# Patient Record
Sex: Female | Born: 1945 | Race: White | Hispanic: No | Marital: Married | Smoking: Never smoker
Health system: Southern US, Community
[De-identification: ages and names within clinical notes are randomized; demographics above are authoritative.]

## PROBLEM LIST (undated history)

## (undated) DIAGNOSIS — E039 Hypothyroidism, unspecified: Secondary | ICD-10-CM

## (undated) DIAGNOSIS — I5022 Chronic systolic (congestive) heart failure: Secondary | ICD-10-CM

## (undated) DIAGNOSIS — E7800 Pure hypercholesterolemia, unspecified: Secondary | ICD-10-CM

## (undated) DIAGNOSIS — J189 Pneumonia, unspecified organism: Secondary | ICD-10-CM

## (undated) DIAGNOSIS — R5600 Simple febrile convulsions: Secondary | ICD-10-CM

## (undated) DIAGNOSIS — I420 Dilated cardiomyopathy: Secondary | ICD-10-CM

## (undated) DIAGNOSIS — Z9289 Personal history of other medical treatment: Secondary | ICD-10-CM

## (undated) DIAGNOSIS — M479 Spondylosis, unspecified: Secondary | ICD-10-CM

## (undated) DIAGNOSIS — I255 Ischemic cardiomyopathy: Secondary | ICD-10-CM

## (undated) DIAGNOSIS — I480 Paroxysmal atrial fibrillation: Secondary | ICD-10-CM

## (undated) DIAGNOSIS — I2510 Atherosclerotic heart disease of native coronary artery without angina pectoris: Secondary | ICD-10-CM

## (undated) DIAGNOSIS — M47815 Spondylosis without myelopathy or radiculopathy, thoracolumbar region: Secondary | ICD-10-CM

## (undated) DIAGNOSIS — M419 Scoliosis, unspecified: Secondary | ICD-10-CM

## (undated) DIAGNOSIS — M311 Thrombotic microangiopathy: Secondary | ICD-10-CM

## (undated) DIAGNOSIS — I5032 Chronic diastolic (congestive) heart failure: Secondary | ICD-10-CM

## (undated) DIAGNOSIS — E041 Nontoxic single thyroid nodule: Secondary | ICD-10-CM

## (undated) DIAGNOSIS — I10 Essential (primary) hypertension: Secondary | ICD-10-CM

## (undated) HISTORY — DX: Dilated cardiomyopathy: I42.0

## (undated) HISTORY — DX: Chronic systolic (congestive) heart failure: I50.22

## (undated) HISTORY — PX: APPENDECTOMY: SHX54

## (undated) HISTORY — PX: SALIVARY STONE REMOVAL: SHX5213

## (undated) HISTORY — PX: BASAL CELL CARCINOMA EXCISION: SHX1214

## (undated) HISTORY — PX: CATARACT EXTRACTION W/ INTRAOCULAR LENS  IMPLANT, BILATERAL: SHX1307

## (undated) HISTORY — PX: CHOLECYSTECTOMY OPEN: SUR202

## (undated) HISTORY — PX: CARDIOVERSION: SHX1299

## (undated) HISTORY — PX: BACK SURGERY: SHX140

## (undated) HISTORY — PX: TUBAL LIGATION: SHX77

## (undated) HISTORY — DX: Morbid (severe) obesity due to excess calories: E66.01

## (undated) HISTORY — DX: Thrombotic microangiopathy: M31.1

## (undated) HISTORY — PX: FOOT SURGERY: SHX648

## (undated) HISTORY — PX: TONSILLECTOMY: SUR1361

## (undated) HISTORY — PX: SPLENECTOMY, TOTAL: SHX788

## (undated) HISTORY — PX: VAGINAL HYSTERECTOMY: SUR661

## (undated) HISTORY — DX: Ischemic cardiomyopathy: I25.5

## (undated) HISTORY — DX: Atherosclerotic heart disease of native coronary artery without angina pectoris: I25.10

## (undated) HISTORY — PX: ABDOMINAL EXPLORATION SURGERY: SHX538

## (undated) HISTORY — PX: THYROIDECTOMY, PARTIAL: SHX18

## (undated) HISTORY — PX: TUMOR EXCISION: SHX421

## (undated) HISTORY — PX: CORONARY ANGIOPLASTY: SHX604

## (undated) HISTORY — PX: DILATION AND CURETTAGE OF UTERUS: SHX78

---
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
## 1997-10-03 ENCOUNTER — Ambulatory Visit (HOSPITAL_COMMUNITY): Admission: RE | Admit: 1997-10-03 | Discharge: 1997-10-03 | Payer: Self-pay | Admitting: *Deleted

## 1998-02-24 ENCOUNTER — Encounter: Payer: Self-pay | Admitting: Emergency Medicine

## 1998-02-24 ENCOUNTER — Emergency Department (HOSPITAL_COMMUNITY): Admission: EM | Admit: 1998-02-24 | Discharge: 1998-02-24 | Payer: Self-pay | Admitting: Emergency Medicine

## 1998-10-16 ENCOUNTER — Other Ambulatory Visit: Admission: RE | Admit: 1998-10-16 | Discharge: 1998-10-16 | Payer: Self-pay | Admitting: Internal Medicine

## 1999-09-16 ENCOUNTER — Encounter: Payer: Self-pay | Admitting: Internal Medicine

## 1999-09-16 ENCOUNTER — Encounter: Admission: RE | Admit: 1999-09-16 | Discharge: 1999-09-16 | Payer: Self-pay | Admitting: Internal Medicine

## 2002-01-31 DIAGNOSIS — R5600 Simple febrile convulsions: Secondary | ICD-10-CM

## 2002-01-31 DIAGNOSIS — Z9289 Personal history of other medical treatment: Secondary | ICD-10-CM

## 2002-01-31 HISTORY — DX: Simple febrile convulsions: R56.00

## 2002-01-31 HISTORY — DX: Personal history of other medical treatment: Z92.89

## 2002-05-02 HISTORY — PX: THORACIC DISCECTOMY: SHX100

## 2002-05-08 ENCOUNTER — Inpatient Hospital Stay (HOSPITAL_COMMUNITY): Admission: EM | Admit: 2002-05-08 | Discharge: 2002-06-25 | Payer: Self-pay | Admitting: Oncology

## 2002-05-08 ENCOUNTER — Encounter: Payer: Self-pay | Admitting: Oncology

## 2002-05-10 ENCOUNTER — Encounter: Payer: Self-pay | Admitting: Oncology

## 2002-05-11 ENCOUNTER — Encounter: Payer: Self-pay | Admitting: Oncology

## 2002-05-12 ENCOUNTER — Encounter: Payer: Self-pay | Admitting: Critical Care Medicine

## 2002-05-12 ENCOUNTER — Encounter: Payer: Self-pay | Admitting: Pulmonary Disease

## 2002-05-13 ENCOUNTER — Encounter: Payer: Self-pay | Admitting: Critical Care Medicine

## 2002-05-14 ENCOUNTER — Encounter: Payer: Self-pay | Admitting: Oncology

## 2002-05-14 ENCOUNTER — Encounter: Payer: Self-pay | Admitting: Pulmonary Disease

## 2002-05-14 ENCOUNTER — Encounter: Payer: Self-pay | Admitting: Nephrology

## 2002-05-14 ENCOUNTER — Encounter (INDEPENDENT_AMBULATORY_CARE_PROVIDER_SITE_OTHER): Payer: Self-pay | Admitting: Cardiology

## 2002-05-15 ENCOUNTER — Encounter: Payer: Self-pay | Admitting: Pulmonary Disease

## 2002-05-16 ENCOUNTER — Encounter: Payer: Self-pay | Admitting: Pulmonary Disease

## 2002-05-18 ENCOUNTER — Encounter: Payer: Self-pay | Admitting: Pulmonary Disease

## 2002-05-22 ENCOUNTER — Encounter: Payer: Self-pay | Admitting: Oncology

## 2002-05-24 ENCOUNTER — Encounter: Payer: Self-pay | Admitting: Pulmonary Disease

## 2002-05-27 ENCOUNTER — Encounter: Payer: Self-pay | Admitting: Oncology

## 2002-05-27 ENCOUNTER — Encounter (INDEPENDENT_AMBULATORY_CARE_PROVIDER_SITE_OTHER): Payer: Self-pay | Admitting: *Deleted

## 2002-05-27 ENCOUNTER — Encounter: Payer: Self-pay | Admitting: Pulmonary Disease

## 2002-05-29 ENCOUNTER — Encounter: Payer: Self-pay | Admitting: Pulmonary Disease

## 2002-05-29 ENCOUNTER — Encounter: Payer: Self-pay | Admitting: Critical Care Medicine

## 2002-05-31 ENCOUNTER — Encounter: Payer: Self-pay | Admitting: Oncology

## 2002-06-01 ENCOUNTER — Encounter: Payer: Self-pay | Admitting: Pulmonary Disease

## 2002-06-02 ENCOUNTER — Encounter: Payer: Self-pay | Admitting: Oncology

## 2002-06-02 ENCOUNTER — Encounter: Payer: Self-pay | Admitting: Pulmonary Disease

## 2002-06-03 ENCOUNTER — Encounter: Payer: Self-pay | Admitting: Oncology

## 2002-06-04 ENCOUNTER — Encounter: Payer: Self-pay | Admitting: Oncology

## 2002-06-05 ENCOUNTER — Encounter: Payer: Self-pay | Admitting: Oncology

## 2002-06-06 ENCOUNTER — Encounter: Payer: Self-pay | Admitting: Oncology

## 2002-06-07 ENCOUNTER — Encounter: Payer: Self-pay | Admitting: Oncology

## 2002-06-12 ENCOUNTER — Encounter: Payer: Self-pay | Admitting: Oncology

## 2002-06-14 ENCOUNTER — Encounter: Payer: Self-pay | Admitting: Cardiology

## 2002-06-14 ENCOUNTER — Encounter: Payer: Self-pay | Admitting: Oncology

## 2002-07-17 ENCOUNTER — Encounter (HOSPITAL_COMMUNITY): Admission: RE | Admit: 2002-07-17 | Discharge: 2002-09-17 | Payer: Self-pay | Admitting: Oncology

## 2003-02-25 ENCOUNTER — Emergency Department (HOSPITAL_COMMUNITY): Admission: EM | Admit: 2003-02-25 | Discharge: 2003-02-25 | Payer: Self-pay | Admitting: *Deleted

## 2003-06-20 ENCOUNTER — Ambulatory Visit (HOSPITAL_COMMUNITY): Admission: RE | Admit: 2003-06-20 | Discharge: 2003-06-20 | Payer: Self-pay | Admitting: Oncology

## 2003-06-24 ENCOUNTER — Inpatient Hospital Stay (HOSPITAL_COMMUNITY): Admission: EM | Admit: 2003-06-24 | Discharge: 2003-06-26 | Payer: Self-pay | Admitting: Emergency Medicine

## 2003-07-31 ENCOUNTER — Encounter: Admission: RE | Admit: 2003-07-31 | Discharge: 2003-08-28 | Payer: Self-pay | Admitting: Oncology

## 2003-10-21 ENCOUNTER — Encounter: Admission: RE | Admit: 2003-10-21 | Discharge: 2003-10-21 | Payer: Self-pay | Admitting: Family Medicine

## 2003-12-24 ENCOUNTER — Ambulatory Visit: Payer: Self-pay | Admitting: Oncology

## 2004-03-23 ENCOUNTER — Ambulatory Visit: Payer: Self-pay | Admitting: Oncology

## 2004-06-01 ENCOUNTER — Ambulatory Visit: Payer: Self-pay | Admitting: Oncology

## 2004-07-22 ENCOUNTER — Ambulatory Visit: Payer: Self-pay | Admitting: Oncology

## 2005-01-28 ENCOUNTER — Ambulatory Visit: Payer: Self-pay | Admitting: Oncology

## 2005-02-04 ENCOUNTER — Ambulatory Visit: Payer: Self-pay | Admitting: Oncology

## 2005-02-04 ENCOUNTER — Ambulatory Visit (HOSPITAL_COMMUNITY): Admission: RE | Admit: 2005-02-04 | Discharge: 2005-02-04 | Payer: Self-pay | Admitting: Oncology

## 2005-03-15 ENCOUNTER — Inpatient Hospital Stay (HOSPITAL_COMMUNITY): Admission: EM | Admit: 2005-03-15 | Discharge: 2005-03-22 | Payer: Self-pay | Admitting: Emergency Medicine

## 2005-03-16 ENCOUNTER — Encounter (INDEPENDENT_AMBULATORY_CARE_PROVIDER_SITE_OTHER): Payer: Self-pay | Admitting: Cardiology

## 2005-03-17 ENCOUNTER — Ambulatory Visit: Payer: Self-pay | Admitting: Oncology

## 2005-03-18 ENCOUNTER — Encounter (INDEPENDENT_AMBULATORY_CARE_PROVIDER_SITE_OTHER): Payer: Self-pay | Admitting: Cardiology

## 2005-03-30 ENCOUNTER — Inpatient Hospital Stay (HOSPITAL_COMMUNITY): Admission: EM | Admit: 2005-03-30 | Discharge: 2005-04-05 | Payer: Self-pay | Admitting: Emergency Medicine

## 2005-05-05 ENCOUNTER — Ambulatory Visit: Payer: Self-pay | Admitting: Oncology

## 2005-05-06 LAB — CBC & DIFF AND RETIC
BASO%: 0.4 % (ref 0.0–2.0)
EOS%: 1.8 % (ref 0.0–7.0)
HCT: 41.9 % (ref 34.8–46.6)
IRF: 0.27 (ref 0.130–0.330)
MCH: 30.6 pg (ref 26.0–34.0)
MCHC: 33.5 g/dL (ref 32.0–36.0)
MCV: 91.4 fL (ref 81.0–101.0)
MONO%: 6.4 % (ref 0.0–13.0)
NEUT%: 55.2 % (ref 39.6–76.8)
lymph#: 3.8 10*3/uL — ABNORMAL HIGH (ref 0.9–3.3)

## 2005-05-06 LAB — MORPHOLOGY
PLT EST: INCREASED
RBC Comments: NORMAL

## 2005-06-21 ENCOUNTER — Inpatient Hospital Stay (HOSPITAL_COMMUNITY): Admission: AD | Admit: 2005-06-21 | Discharge: 2005-06-24 | Payer: Self-pay | Admitting: Cardiology

## 2005-08-05 ENCOUNTER — Ambulatory Visit: Payer: Self-pay | Admitting: Oncology

## 2005-08-12 LAB — COMPREHENSIVE METABOLIC PANEL
CO2: 28 mEq/L (ref 19–32)
Creatinine, Ser: 1.27 mg/dL — ABNORMAL HIGH (ref 0.40–1.20)
Glucose, Bld: 109 mg/dL — ABNORMAL HIGH (ref 70–99)
Total Bilirubin: 0.3 mg/dL (ref 0.3–1.2)
Total Protein: 6.4 g/dL (ref 6.0–8.3)

## 2005-08-12 LAB — LACTATE DEHYDROGENASE: LDH: 151 U/L (ref 94–250)

## 2005-08-12 LAB — CBC WITH DIFFERENTIAL/PLATELET
Basophils Absolute: 0.1 10*3/uL (ref 0.0–0.1)
Eosinophils Absolute: 0.5 10*3/uL (ref 0.0–0.5)
HCT: 40.1 % (ref 34.8–46.6)
HGB: 13.4 g/dL (ref 11.6–15.9)
MCV: 89 fL (ref 81.0–101.0)
MONO%: 7.3 % (ref 0.0–13.0)
NEUT#: 4.3 10*3/uL (ref 1.5–6.5)
NEUT%: 45.3 % (ref 39.6–76.8)
RDW: 14.2 % (ref 11.3–14.5)
lymph#: 4 10*3/uL — ABNORMAL HIGH (ref 0.9–3.3)

## 2006-07-21 ENCOUNTER — Ambulatory Visit: Payer: Self-pay | Admitting: Oncology

## 2006-10-26 ENCOUNTER — Encounter: Admission: RE | Admit: 2006-10-26 | Discharge: 2006-10-26 | Payer: Self-pay | Admitting: Cardiology

## 2007-07-18 ENCOUNTER — Ambulatory Visit: Payer: Self-pay | Admitting: Oncology

## 2007-07-20 LAB — CBC & DIFF AND RETIC
BASO%: 1.1 % (ref 0.0–2.0)
Basophils Absolute: 0.1 10*3/uL (ref 0.0–0.1)
EOS%: 2.2 % (ref 0.0–7.0)
HGB: 13.2 g/dL (ref 11.6–15.9)
IRF: 0.23 (ref 0.130–0.330)
MCH: 30.1 pg (ref 26.0–34.0)
MCHC: 33.7 g/dL (ref 32.0–36.0)
MCV: 89.3 fL (ref 81.0–101.0)
MONO%: 6.6 % (ref 0.0–13.0)
RBC: 4.39 10*6/uL (ref 3.70–5.32)
RDW: 13.9 % (ref 11.3–14.5)
RETIC #: 35.1 10*3/uL (ref 19.7–115.1)
Retic %: 0.8 % (ref 0.4–2.3)
lymph#: 3.9 10*3/uL — ABNORMAL HIGH (ref 0.9–3.3)

## 2007-07-20 LAB — MORPHOLOGY: PLT EST: INCREASED

## 2007-07-20 LAB — COMPREHENSIVE METABOLIC PANEL
ALT: 12 U/L (ref 0–35)
AST: 16 U/L (ref 0–37)
BUN: 21 mg/dL (ref 6–23)
Calcium: 8.8 mg/dL (ref 8.4–10.5)
Chloride: 102 mEq/L (ref 96–112)
Creatinine, Ser: 1.06 mg/dL (ref 0.40–1.20)
Total Bilirubin: 0.4 mg/dL (ref 0.3–1.2)

## 2007-11-29 IMAGING — CR DG CHEST 2V
2 series · 2 of 2 positions shown · non-contrast
Comparison: none

CLINICAL DATA: Persistent cough since mid Klever.  Wheezing.  
 CHEST - 2 VIEW:

[view not recorded (1 of 2)]
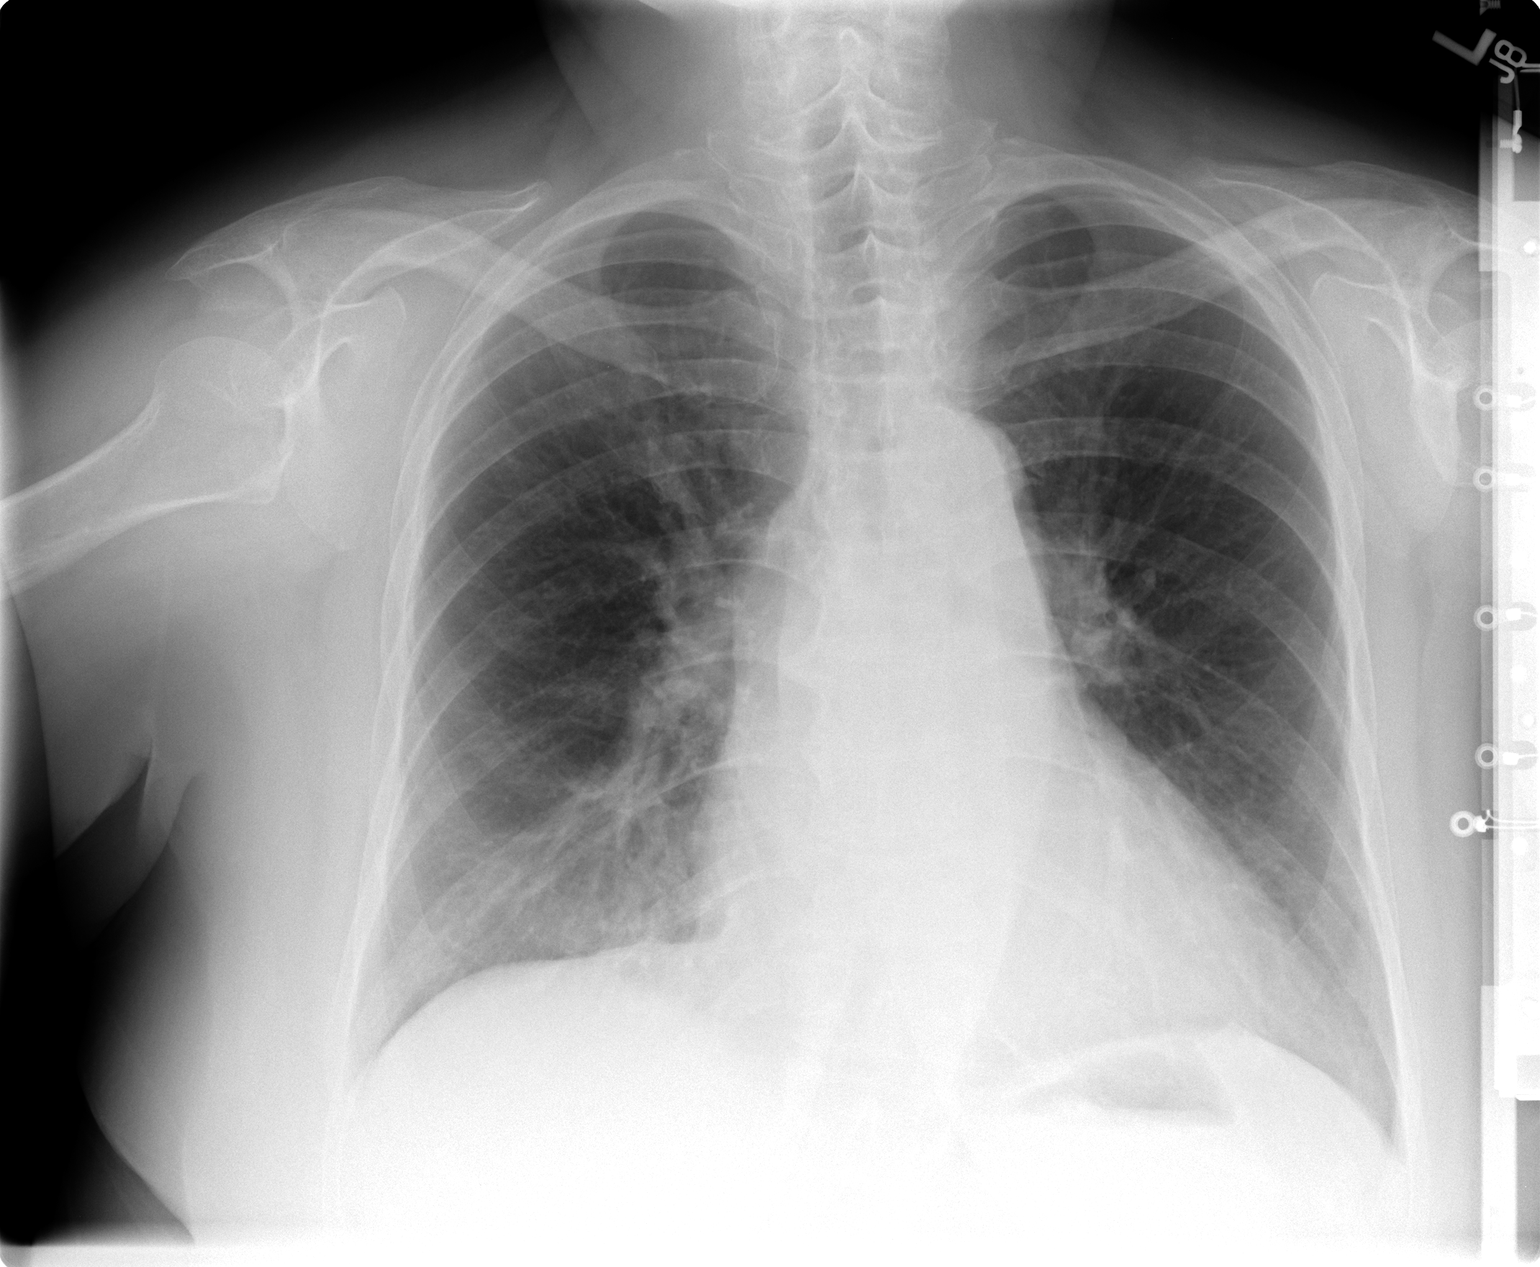

[view not recorded (2 of 2)]
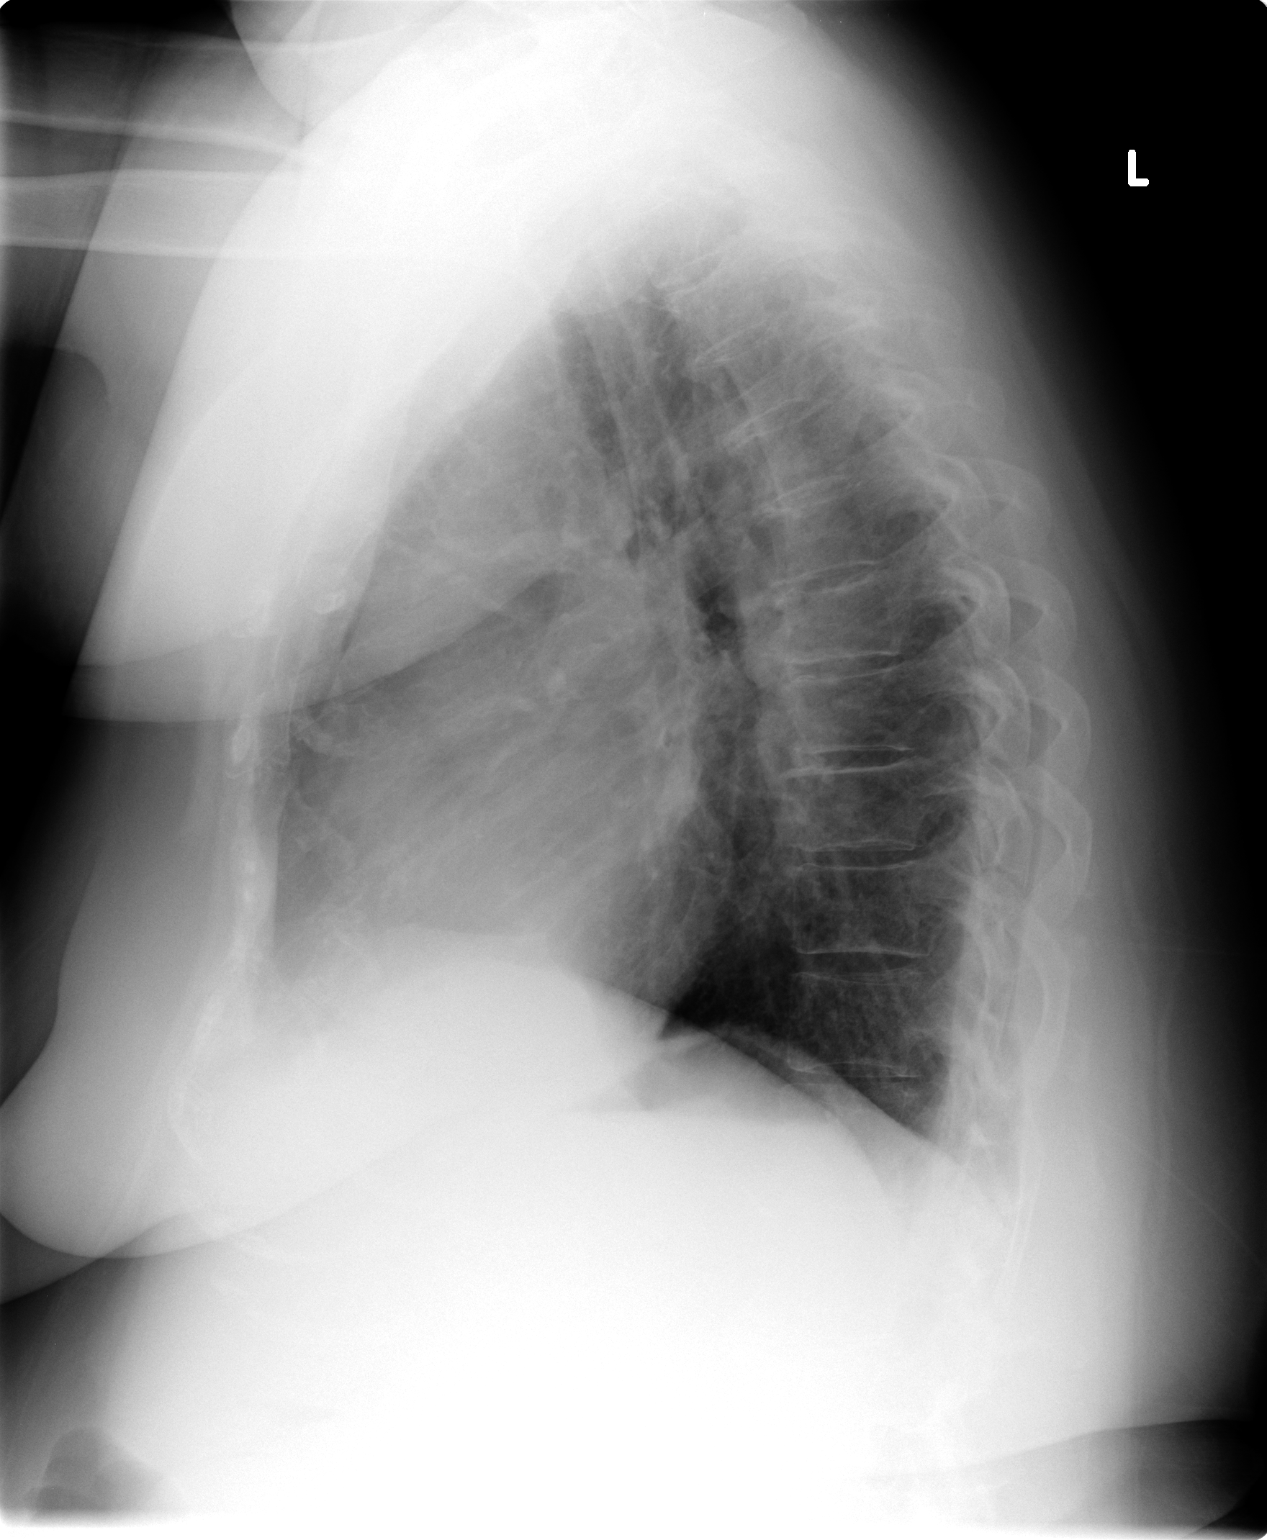

[2 of 2 positions shown; findings below may reference images not displayed]

FINDINGS: PA and lateral views of the chest are made and are compared to the previous studies of 02/25/03 from [HOSPITAL] [HOSPITAL] and show stable diffuse peribronchial thickening compatible with a chronic bronchitis.  There is no consolidation, pleural effusion, or pneumothorax.  There is no definite air trapping.  The heart is minimally prominent in size but no edema is present.  The bones show anterior degenerative hypertrophic spurring.
IMPRESSION: Stable diffuse peribronchial thickening most compatible with chronic bronchitis.  Minimal cardiomegaly without edema, effusion, or pneumothorax.

## 2007-12-26 ENCOUNTER — Emergency Department (HOSPITAL_BASED_OUTPATIENT_CLINIC_OR_DEPARTMENT_OTHER): Admission: EM | Admit: 2007-12-26 | Discharge: 2007-12-26 | Payer: Self-pay | Admitting: Emergency Medicine

## 2008-07-16 ENCOUNTER — Ambulatory Visit: Payer: Self-pay | Admitting: Oncology

## 2008-07-18 LAB — CBC & DIFF AND RETIC
BASO%: 0.1 % (ref 0.0–2.0)
EOS%: 2.3 % (ref 0.0–7.0)
LYMPH%: 41.8 % (ref 14.0–49.7)
MCH: 31 pg (ref 25.1–34.0)
MCHC: 33.9 g/dL (ref 31.5–36.0)
MONO#: 0.6 10*3/uL (ref 0.1–0.9)
RBC: 4.39 10*6/uL (ref 3.70–5.45)
Retic %: 1.4 % (ref 0.4–2.3)
WBC: 9 10*3/uL (ref 3.9–10.3)
lymph#: 3.7 10*3/uL — ABNORMAL HIGH (ref 0.9–3.3)

## 2008-07-18 LAB — COMPREHENSIVE METABOLIC PANEL
Alkaline Phosphatase: 76 U/L (ref 39–117)
CO2: 26 mEq/L (ref 19–32)
Creatinine, Ser: 1.33 mg/dL — ABNORMAL HIGH (ref 0.40–1.20)
Glucose, Bld: 103 mg/dL — ABNORMAL HIGH (ref 70–99)
Sodium: 142 mEq/L (ref 135–145)
Total Bilirubin: 0.2 mg/dL — ABNORMAL LOW (ref 0.3–1.2)
Total Protein: 6.7 g/dL (ref 6.0–8.3)

## 2008-07-18 LAB — CHCC SMEAR

## 2008-07-18 LAB — MORPHOLOGY

## 2008-07-18 LAB — LACTATE DEHYDROGENASE: LDH: 161 U/L (ref 94–250)

## 2008-10-17 ENCOUNTER — Emergency Department (HOSPITAL_BASED_OUTPATIENT_CLINIC_OR_DEPARTMENT_OTHER): Admission: EM | Admit: 2008-10-17 | Discharge: 2008-10-18 | Payer: Self-pay | Admitting: Emergency Medicine

## 2008-10-18 ENCOUNTER — Ambulatory Visit: Payer: Self-pay | Admitting: Diagnostic Radiology

## 2009-07-17 ENCOUNTER — Ambulatory Visit: Payer: Self-pay | Admitting: Oncology

## 2009-07-21 LAB — MORPHOLOGY: PLT EST: INCREASED

## 2009-07-21 LAB — CBC WITH DIFFERENTIAL/PLATELET
BASO%: 1.1 % (ref 0.0–2.0)
Basophils Absolute: 0.1 10*3/uL (ref 0.0–0.1)
HCT: 41.4 % (ref 34.8–46.6)
HGB: 13.7 g/dL (ref 11.6–15.9)
MONO#: 0.7 10*3/uL (ref 0.1–0.9)
NEUT%: 44.8 % (ref 38.4–76.8)
WBC: 10 10*3/uL (ref 3.9–10.3)
lymph#: 4.5 10*3/uL — ABNORMAL HIGH (ref 0.9–3.3)

## 2009-07-28 ENCOUNTER — Encounter: Admission: RE | Admit: 2009-07-28 | Discharge: 2009-07-28 | Payer: Self-pay | Admitting: Oncology

## 2009-11-13 ENCOUNTER — Ambulatory Visit: Payer: Self-pay | Admitting: Oncology

## 2009-11-17 LAB — CBC WITH DIFFERENTIAL/PLATELET
BASO%: 0.6 % (ref 0.0–2.0)
Basophils Absolute: 0.1 10*3/uL (ref 0.0–0.1)
EOS%: 1.5 % (ref 0.0–7.0)
HGB: 13.6 g/dL (ref 11.6–15.9)
LYMPH%: 39.4 % (ref 14.0–49.7)
MCH: 31 pg (ref 25.1–34.0)
MCV: 92.9 fL (ref 79.5–101.0)
NEUT%: 51.9 % (ref 38.4–76.8)
Platelets: 512 10*3/uL — ABNORMAL HIGH (ref 145–400)
RDW: 14 % (ref 11.2–14.5)
lymph#: 3.6 10*3/uL — ABNORMAL HIGH (ref 0.9–3.3)

## 2009-11-17 LAB — MORPHOLOGY

## 2010-02-21 ENCOUNTER — Encounter: Payer: Self-pay | Admitting: Cardiology

## 2010-02-21 ENCOUNTER — Encounter: Payer: Self-pay | Admitting: Oncology

## 2010-03-23 ENCOUNTER — Encounter (HOSPITAL_BASED_OUTPATIENT_CLINIC_OR_DEPARTMENT_OTHER): Payer: MEDICARE | Admitting: Oncology

## 2010-03-23 ENCOUNTER — Other Ambulatory Visit: Payer: Self-pay | Admitting: Oncology

## 2010-03-23 DIAGNOSIS — I10 Essential (primary) hypertension: Secondary | ICD-10-CM

## 2010-03-23 LAB — CBC & DIFF AND RETIC
BASO%: 0.8 % (ref 0.0–2.0)
EOS%: 3.1 % (ref 0.0–7.0)
HCT: 41.8 % (ref 34.8–46.6)
LYMPH%: 44.1 % (ref 14.0–49.7)
MCH: 29.7 pg (ref 25.1–34.0)
MCHC: 32.8 g/dL (ref 31.5–36.0)
NEUT%: 44.1 % (ref 38.4–76.8)
Platelets: 499 10*3/uL — ABNORMAL HIGH (ref 145–400)

## 2010-03-23 LAB — MORPHOLOGY: PLT EST: INCREASED

## 2010-05-07 LAB — CBC
HCT: 43.2 % (ref 36.0–46.0)
Hemoglobin: 14.3 g/dL (ref 12.0–15.0)
WBC: 11.5 10*3/uL — ABNORMAL HIGH (ref 4.0–10.5)

## 2010-05-07 LAB — POCT TOXICOLOGY PANEL

## 2010-05-07 LAB — BASIC METABOLIC PANEL
BUN: 18 mg/dL (ref 6–23)
CO2: 27 mEq/L (ref 19–32)
Chloride: 102 mEq/L (ref 96–112)
Creatinine, Ser: 1.3 mg/dL — ABNORMAL HIGH (ref 0.4–1.2)
Glucose, Bld: 108 mg/dL — ABNORMAL HIGH (ref 70–99)

## 2010-05-07 LAB — HEPATIC FUNCTION PANEL
ALT: 10 U/L (ref 0–35)
Albumin: 3.8 g/dL (ref 3.5–5.2)
Alkaline Phosphatase: 76 U/L (ref 39–117)
Total Protein: 7.1 g/dL (ref 6.0–8.3)

## 2010-05-07 LAB — POCT CARDIAC MARKERS
CKMB, poc: <1 ng/mL — ABNORMAL LOW (ref 1.0–8.0)
Troponin i, poc: <0.05 ng/mL (ref 0.00–0.09)

## 2010-05-07 LAB — URINALYSIS, ROUTINE W REFLEX MICROSCOPIC
Bilirubin Urine: NEGATIVE
Glucose, UA: NEGATIVE mg/dL
Ketones, ur: NEGATIVE mg/dL
Protein, ur: NEGATIVE mg/dL

## 2010-05-07 LAB — DIFFERENTIAL
Basophils Relative: 2 % — ABNORMAL HIGH (ref 0–1)
Eosinophils Absolute: 0.1 10*3/uL (ref 0.0–0.7)
Monocytes Absolute: 0.8 10*3/uL (ref 0.1–1.0)
Monocytes Relative: 7 % (ref 3–12)

## 2010-06-18 NOTE — Discharge Summary (Signed)
NAME:  [name], [name]                      ACCOUNT NO.:  [id number]   MEDICAL RECORD NO.:  [id number]                   PATIENT TYPE:  INP   LOCATION:  5503                                 FACILITY:  [facility]   PHYSICIAN:  Genene Churn. Cyndie Chime, M.D.          DATE OF BIRTH:  [birth year]   DATE OF ADMISSION:  DATE OF DISCHARGE:  06/25/2002                                 DISCHARGE SUMMARY   HISTORY:  The patient is a complicated 65 year old woman in overall good  health except for mild hypertension and hypothyroidism.  She presented on  the day of admission with a three-month-history of atypical diffuse  abdominal pain.  Outpatient evaluation including CT scans and MRI scan  showed no obvious pathology.  Initial laboratory evaluations were all  unremarkable.  In the three days prior to admission, she developed  increasing abdominal pain, vomiting, and diarrhea without any fever.  On the  day of admission, she had a presyncopal episode, distal paresthesias, change  in her vision, and went to her family practice physician.  Lab work was  repeated, and a platelet count was 8000.  She was sent to the hospital for  further evaluation.   LABORATORY DATA:  CBC done in the emergency department showed a hemoglobin  of 10, MCV 84, white count 9700, 73% neutrophils, 21% lymphocytes, and a  platelet count of 7000.  I reviewed the peripheral blood film which showed  the presence of schizocytes, approximately one to three per high-powered  field, and numerous micro spherocytes.  There was polychromasia.  Pro-time  normal at 12.7 seconds.  PTT 36 seconds.  Direct Coombs' test negative.  Chemistry profile showed marked elevation of serum, LDH 1041, with a mild  elevation of SGOT 55, and bilirubin 2.6 total with remainder of liver  functions being normal.   The patient gave a history of a hospitalization at [facility] in  [place] in December of 1998 for pneumonia.  I was able to find  those  records.  She developed thrombocytopenia during the hospitalization with  fall in her platelet count to 12,000.  This was felt related to Levaquin  antibiotic, but she was also given Rocephin during the same admission.  It  was specifically stated that no microangiopathic changes were seen on the  peripheral blood smear.  Of note, at the time of subsequent admission,  March 27, 1998, a platelet count was noted to be 385,000.   PHYSICAL EXAMINATION:  GENERAL:  On initial examination, she was an obese  woman, Caucasian, in no obvious discomfort.  VITAL SIGNS:  Blood pressure 128/55, pulse 83 and regular, respirations 18,  temperature 99.4 orally.  SKIN:  On the skin, there was some small scattered ecchymosis, but no  petechiae.  No hemorrhage or exudate in the fundi.  CARDIAC:  No cardiac murmurs.  No lymphadenopathy, no breast masses.  ABDOMEN:  Soft, obese, mildly but diffusely tender.  No palpable  mass or  organomegaly.  RECTAL:  Normal.  Stool guaiac negative.  NEUROLOGIC:  There were no focal neurologic deficits.   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   ADDITIONAL LABORATORY STUDIES:  Showed elevated amylase and lipase.  It was  felt that she was initially showing micro platelet thrombi to her pancreas  to explain her unusual abdominal symptoms over the last few months.   Vascular surgery consultation was obtained to place a vascular catheter in  anticipation of plasma exchange therapy.  She had some previous surgery in  her neck.  The surgeon first attempted a left internal jugular approach in  view of her marked thrombocytopenia, but met with resistance and presumed  prior thrombosis of that vessel.  He then attempted a right internal jugular  approach, and although he was able to cannulate the vein, the blood clotted  immediately.  He then moved to a right femoral approach, and was successful.   The patient began a  program of agressive plasma exchange on the morning  following admission.  I elected to also add Solu-Medrol at a dose of  approximately 1 mg per kilogram IV.  Blood was checked for hepatitis A, B, C  and HIV.  She was positive for hepatitis A antibody but negative for  hepatitis C, hepatitis B surface antigen, and HIV.  A fibrinogen was  elevated at 498.   Initial treatment was started with one plasma volume exchange daily.  She  tolerated the procedure well.  However, within 48 hours of admission, she  began to develop agitation.  This progressed to significant confusion.  She  then became febrile to 102.8 degrees.  She was transferred to the intensive  care unit and electively intubated.  She began to have grand mal seizures.  She became obtunded.  She had intermittent seizures for the next six days.  Her platelet count remained less than 10,000.   On the sixth hospital day, I increased her to a twice daily plasma exchange.  At that point, she also developed refractory supraventricular tachycardia/  atrial fibrillation requiring a Cardizem infusion and parenteral digoxin.  She remained in atrial flutter for two weeks before returning to sinus  rhythm.  She developed acute renal failure and acute hepatic failure with  BUN rising to 4 mg with concomitant oliguria.  She was covered with multiple  antibiotics for her fevers, and multiple anticonvulsants for her seizures.  A CT scan of the brain done x2 revealed no focal abnormalities.  She  developed profuse diarrhea.  She had persistent culture-negative fever.   By May 15, 2002, temperature rose to 104.7.  Methicillin-resistant  staphylococcus aureus was cultured from tracheal secretions, but not from  blood at that time.  Vancomycin was added to her antibiotic regimen.  She  developed significant hyperglycemia requiring an insulin infusion.  She continued to have seizures until May 17, 2002 when I placed her on around-  the-clock  Tylenol to suppress fevers in addition to anticonvulsants,  (Cerebyx, Dilantin, and Keppra).   On May 16, 2002, platelet count started to rise up to 34,000 with fall in  LDH to 675, and bilirubin to 1.4, compared with a peak bilirubin of 4.6.  By  May 19, 2002, platelets were up to 66,000.  However, on the following day,  May 20, 2002, platelets fell to 35,000 with subsequent fall to 19,000 by  May 21, 2002.  Hemoglobins fell, bilirubin rose, LDH rose every time  her  platelet count fell, and schizocytes were still obvious on peripheral blood  film.  A course of weekly Rituxan was started on May 20, 2002, and the  patient received a total of four weekly doses (April 19, April 27, May 4,  and May 11).  Platelet count came up to 38,000 on April 21.  Vaccinations  were given against meningitis, pneumococcus, and Haemophilus influenza in  anticipation of a splenectomy.  Platelet count came up to 50,000 by May 24, 2002, and was 59,000 by April 23 with LDH down to 254 and bilirubin 0.9.  I strongly recommended splenectomy to the family at that time.   On April 26, elective open splenectomy was performed by Dr. Farrel Gobble.  Preoperative hemoglobin was 9.4 and platelet count 50,000.  The patient  tolerated the procedure well with minimal blood loss and no need for  intraoperative transfusion.  Unfortunately, a few hours postoperatively, the  platelet count was 15,000.  Approximately 12 hours postoperatively, the  patient became hypotensive down to a systolic blood pressure of 88.  Clinical suspicion was high for abdominal bleeding.  She was given emergency  blood and platelet transfusion.  Platelet count repeated prior to platelet  transfusion was 10,000 and a few hours later, only 30,000 with rapid fall  down to about 11,000 by the next day.   On clinical exam, the abdomen was soft.  Emergent CT scan did not show any  obvious hemorrhage.  Blood pressure stabilized rapidly with a  fluid  challenge and with blood products.  It appears that she did not have an  acute, severe hemorrhage to explain the transient hypotension.   She remained on aggressive b.i.d. plasma exchange.  Approximately one week  following the splenectomy, the platelet count started to rise, and was  50,000 on April 29, 132,000 on April 30.  Unfortunately, the patient again  spiked a high fever on Jun 01, 2002, and platelet count promptly fell back  down to 25,000 by Jun 03, 2002.  Blood cultures grew methicillin-resistant  staphylococcus aureus.  (Of note, initial empiric vancomycin had been  stopped May 21, 2002).  After she was on antibiotics for approximately 72  hours, platelet count started to rise again, and was up to 190,000 by May 6.  At that point, I attempted to decrease frequency of exchange to once daily.  However, on May 7, platelet count fell down to 68,000, and b.i.d. exchange was resumed.  I had been tapering her steroids at that point, and I  increased them back up from 20 to 40 mg daily of Solu-Medrol.   Back on twice daily exchange, platelet count came up promptly.  By Jun 11, 2002, it was 274,000.  Beginning on Jun 13, 2002, an attempt was made to go  every-other-day exchange, after tapering to single daily exchange for the  previous five days.  However, after skipping just two plasma exchanges on  May 14 and May 16, platelet count fell from 340,000 down to 38,000 by May  17, and daily plasma exchange was resumed.  Concomitant with falling  platelets, LDH went up to 379, and bilirubin to 1.2, reticulocyte count,  3.2%.   Back on daily exchange, platelet count again rose briskly, and by May 24,  was up to 263,000 with a hemoglobin of 9.3, white count 9500, reticulocyte  count 2.8%, LDH 128, and bilirubin of 0.6.   At that point, the patient had been on almost continuous plasma exchange  for  six weeks.  She had received steroids, Rituxan, splenectomy.  However, at  each  attempt to wean the patient off phoresis, platelet count rapidly fell.  I felt the only alternative at this point was the use of a cytotoxic agent.  I proposed the use of Cytoxan at a dose of 750 mg per meters squared IV, to  the husband.  I reviewed the rationale for this treatment and the potential  side effects.  The patient and the husband initially agreed to the  treatment, but other family members became very anxious and requested a  second opinion at [facility], and I made arrangements to  transfer her.   With respect to the initial seizures and obtundation, as noted above, she  had intermittent seizures for about six days.  She remained obtunded and  intubated until May 29, 2002, when she woke up and slowly recovered  neurologic function.  She was extubated on April 29.  Anticonvulsants were  tapered, and she remains on a single anticonvulsant, Keppra at this time.   With respect to her initial, acute, hepatorenal failure, she did respond to  high-dose diuretics as recommended by nephrology consultant.  We were able  to get a good diuresis, and stabilization, and then complete normalization  of her renal function.  BUN and creatinine near the time of discharge are  normal at 7 and 0.7 respectively, done on Jun 20, 2002.   With respect to her cardiac arrhythmias, after being in refractory atrial  flutter, on a continuous Cardizem infusion for two weeks, she resumed normal  sinus rhythm.  However, on Jun 03, 2002, she abruptly went back into  supraventricular tachyarrhythmia.  At that time, her platelet count  fortunately was on the upswing, and her cardiology consultant recommended  cardioversion.  She was cardioverted on Jun 03, 2002, with 200 joules of  energy and reverted to normal sinus rhythm.  She was kept on a cardiac  monitor for an additional week.  Lopressor and Cardizem were continued.  She remains in sinus rhythm at the time of discharge.   Initial  refractory diarrhea, also caused refractory hypokalemia.  The  diarrhea eventually stopped, and electrolyte balance was restored.  She had  problems with low magnesium, low calcium, elevated sodium, all of which  stabilized and corrected by the time of discharge.   She developed significant swallowing dysfunction due to prolonged  intubation, but within a week of extubation, she was able to swallow again,  and was eating a regular diet at the time of transfer.   She is severely deconditioned.  She has been working with our physical  therapist since neurologic recovery, and is making significant progress, and  is now able to ambulate with maximum assistance from bed to bathroom.  She  will continue to need ongoing physical therapy.   A urine culture and analysis was obtained when platelet count again fell on  attempt to go to q.o.d. schedule over the last weekend.  Urine did grow  50,000 colonies of proteus sensitive to Bactrim, and she was put on oral  Septra on Jun 18, 2002.   With respect to methicillin-resistant staphylococcus aureus infection, and  in view of the fact that she still has a vascular catheter in place, I  initially had the vascular catheter removed from her groin area and replaced  in the right subclavian position at the time of her splenectomy of April 26.  A left subclavian line was placed  at the time of initial deterioration on  May 10, 2002, was removed.  The vancomycin second course was started on Jun 01, 2002.  She has currently had 23 days of a planned 28 day course.  Obviously, it would be optimal to have her vascular catheter removed as soon  as possible if her TTP stabilizes.   With respect to insulin-dependent diabetes, this improved, but did not  correct completely with tapering her steroids.  Current dose of prednisone  down to 20 mg.  Blood glucose remaining in reasonable range on 20 units of  NPH insulin daily.   Pathology on the spleen showed  benign splenic tissue with red pulp  congestion.   An echocardiogram repeated twice during this admission showed normal valve  structures, and a normal cardiac ejection fraction.   Review of outside CT and MRI scans of the abdomen done prior to admission  confirmed absence of any abdominal or pelvic pathology.   I believe that I checked an ANA, and it was negative, but I can not find the  result at this time.   CONSULTATIONS:  1. Critical-care medicine.  2. Cardiovascular surgery.  3. Neurology.  4. Nephrology.  5. Cardiology.   PROCEDURES:  1. Multiple plasma exchanges, almost daily between April 26, and Jun 24, 2002.  2. Multiple blood transfusions.  3. Single platelet transfusion.  4. Massive plasma transfusion.  5. Placement of a right femoral vascular access catheter.  6. Removal of femoral catheter and placement in the right subclavian     position. 7. Placement of a left central venous pressure line, subsequent removal of     the same line.  8. Splenectomy May 27, 2002.  9. DC cardioversion Jun 03, 2002.  10.      Electroencephalogram x2.  11.      Echocardiogram x2.  12.      Placement of PANDA feeding tube.  13.      Swallowing study.  14.      CT of the brain x2.  15.      CT of the abdomen.   DIAGNOSES:  1. Resistant TTP (thrombotic thrombocytopenic purpura).  2. Prolonged seizure secondary to #1.  3. Acute hepatorenal failure secondary to #1.  4. Refractory supraventricular tachyarrhythmias requiring cardioversion.  5. Coagulase negative methicillin-resistant staphylococcus aureus     colonization of respiratory secretions.  6. Methicillin-resistant staphylococcus aureus septicemia.  7. Refractory diarrhea.  8. Proteus urinary tract infection.  9. Swallowing dysfunction, adverse effect of prolonged intubation.  10.      Intermittent congestive heart failure due to fluid overload.  11.      Left lower lobe atelectasis, rule out early pneumonia.  12.       Deconditioning.  13.      Hypokalemia.  14.      Hypernatremia.  15.      Hypocalcemia.  16.      Hypomagnesemia.  17.      Insulin-dependent diabetes mellitus.  18.      Hypothyroid on replacement.  19.      History of hypertension.   DISPOSITION:  Condition is stable at time of discharge.  Per family request,  the patient will be transferred to [facility] under the care  of Dr. Miachel Roux and colleagues for further management of her resistant  Thrombotic thrombocytopenic purpura.   MEDICATIONS AT TIME OF TRANSFER:  1. NPH insulin, 20 units subcu every morning.  2. Prednisone 20 mg p.o. every day  3. Haldol 2 mg p.o. twice a day which could probably be tapered and stopped     at this point.  4. Keppra 250 mg p.o. every 12 hours.  5. Protonix 40 mg every day  6. Synthroid 0.075 mg every day.  7. Lopressor 25 mg p.o. every 8 hours.  8. Cardizem CD, 180 mg every day.  9. Mycelex troches three times a day while on steroids.  10.      Septra DS, one p.o. twice a day started on May 18, can be stopped     pending followup urine culture.  11.      Vancomycin 1500 mg IV daily, today day #23 of 28 planned days for     staphylococcus sepsis.  12.      Dilantin 100 mg capsules two p.o. at bedtime.  13.      Lomotil as needed for diarrhea.  14.      Xanax 0.25 - 0.5 mg every 6 hours as needed for anxiety.  15.      Flush vascular catheter with ACD - acid citrate dextrose solution.  16.      Additional plasma change per hematologist at [facility].   ALLERGIES:  Note, probable allergy to Levaquin and possible allergy to  Rocephin, both potentially causing thrombocytopenia.                                               Genene Churn. Cyndie Chime, M.D.    Lottie Rater  D:  06/24/2002  T:  06/25/2002  Job:  [id number]   cc:   Zadie Cleverly, Dr.  [facility],  [place], [place].   [facility]  Joycelyn Rua, Dr.  Thomasene Lot M.C.  [address] [place], [address]   P. Liliane Bade, M.D.  [address]  [place]  [place] [address]  Fax: [phone]   Dyke Maes, M.D.  [address]  [place]  [place] [address]  Fax: [phone]   Velora Heckler, M.D.  [address] [place]  [place] [address]  Fax: [phone]   Vida Roller, M.D.  Fax: [phone]   Melvyn Novas, M.D.  [address]  [address]  [place]  [place] [address]  Fax: [phone]   Marcelyn Bruins, M.D. [facility]

## 2010-06-18 NOTE — Discharge Summary (Signed)
NAME:  [name], [name]            ACCOUNT NO.:  [id number]   MEDICAL RECORD NO.:  [id number]          PATIENT TYPE:  INP   LOCATION:  2017                         FACILITY:  [facility]   PHYSICIAN:  Francisca December, M.D.  DATE OF BIRTH:  March 14, 1945   DATE OF ADMISSION:  06/21/2005  DATE OF DISCHARGE:  06/24/2005                                 DISCHARGE SUMMARY   ADMISSION DIAGNOSES:  1.  Heart failure, diastolic dysfunction.  2.  Recurrent atrial fibrillation, borderline controlled ventricular      response.  3.  Hypothyroidism/remote goiter, on replacement status post resection.  4.  Hypertension.  5.  History of thrombotic thrombocytopenic purpura with splenectomy.  6.  Diabetes mellitus not currently on oral agent.  7.  Obesity.  8.  Systemic anticoagulation.   DISCHARGE DIAGNOSES:  1.  Sinus rhythm, restored status post direct current cardioversion.  2.  Paroxysmal atrial fibrillation.  3.  Heart failure, resolved.  4.  Hypertension.  5.  Thrombotic thrombocytopenic purpura.  6.  Diabetes mellitus.  7.  Obesity.  8.  Systemic anticoagulation.   HISTORY AND PHYSICAL EXAMINATION:  Please see the note dated Jun 21, 2005.  Briefly, [name] is a 65 year old woman, who has the above problems  and presented to my office on Jun 21, 2005, complaining of lightheadedness,  fatigue, shortness of breath, and weight gain.  She was noted to be in  atrial fibrillation which was a change from her previous office visit the  end of April.  The ventricular response was 90-100 range.  She was admitted  at that time for amiodarone loading, treatment of her heart failure, and  anticipated cardioversion.   ADMISSION LABORATORY:  Admission hemogram was normal with the exception of a  white blood cell count of 10,900.  Serum electrolytes normal except for a  mildly low potassium at 3.3.  Liver associated enzymes were normal.  Thyroid  function study:  TSH was 1.269.  BNP was 148.   Urinalysis negative.  INR on  admission 2.3.   HOSPITAL COURSE:  The patient, as above, was admitted to telemetry  monitoring and had an intravenous line placed.  She was begun on intravenous  amiodarone initially at 34 mL/hr x6 hours then at 17 mL/hr.  This was  maintained throughout her hospital stay.  She was also treated with  intravenous Furosemide 80 mg IV b.i.d. and experienced a significant  diuresis.  Discharge weight is 124 kg.  On admission, the weight was 126 kg.  She did have some drop in her potassium which was repleted.  It got as low  as 3.1.  On the day of discharge, it is 3.3, and we are continuing  replacement of that at 20 mEq twice daily.   On Jun 23, 2005, the patient underwent an uncomplicated direct current  cardioversion responding to a single dose of 120 joules synchronized  biphasic.  She had had deep anesthesia induced by 275 mg of Pentothal under  the supervision of Dr. Ivin Booty of the anesthesia department.  She, on the day  of discharge, is feeling well with the  exception of mild sharp left  parasternal pain that comes and goes, lasts for seconds.  I believe this is  related to her cardioversion yesterday.   DISCHARGE MEDICATIONS:  1.  Levoxyl 0.075 mg once daily.  2.  Warfarin 7.5 mg p.o. daily.  3.  Metoprolol 25 mg twice daily.  4.  Lotrel 5/10 once daily.  5.  Furosemide 80 mg p.o. once daily.  6.  Amiodarone 400 mg p.o. daily.  7.  Potassium chloride 20 mEq twice daily.   DISPOSITION:  The patient is discharged to home in improved condition.   FOLLOW UP:  She has an appointment with me on 08 July 2005 at 2 p.m.  She is  to be in the office for a PT/INR check on Jun 28, 2005, at 10:30 a.m.      Francisca December, M.D.  Electronically Signed     JHE/MEDQ  D:  06/24/2005  T:  06/25/2005  Job:  [id number]

## 2010-06-18 NOTE — Consult Note (Signed)
NAME:  Ariel, [name]                      ACCOUNT NO.:  [id number]   MEDICAL RECORD NO.:  [id number]                   PATIENT TYPE:  INP   LOCATION:  2307                                 FACILITY:   PHYSICIAN:  Dyke Maes, M.D.          DATE OF BIRTH:  04-01-1945   DATE OF CONSULTATION:  05/13/2002  DATE OF DISCHARGE:                                   CONSULTATION   REASON FOR CONSULTATION:  Increasing BUN and creatinine, the patient with  questionable TTP.   HISTORY OF PRESENT ILLNESS:  [name] is a 65 year old white female  with a history of hypertension who went to her primary care physician and  complained of a presyncopal episode on April 7.  The CBC at the primary care  physician showed platelets of 8000 and [name] was sent to the [facility] emergency department and repeat CBC there showed platelets of 7000 and  a hemoglobin of 10 with schistocytes thought to be representative of TTP.  [name] was admitted to [facility], given packed red blood cells and  fresh frozen plasma.  She had a mental status change requiring elective  intubation on April 10 with a negative head CT scan and elevated LFTs.  Starting April 11, she had had grand mal seizures. now approximately five  per hour, now on phosphenytoin and Ativan p.r.n.  Nephrology was consulted  for the increasing BUN and creatinine acutely starting on April 10.   PAST MEDICAL HISTORY:  1. Hypertension.  2. Status post cholecystectomy.  3. Status post total abdominal hysterectomy many years ago.  4. Status post right cataract surgery.  5. Status post multiple back surgeries after fall and crush injury.  6. Status post removal of bilateral benign tumors in the neck.   MEDICATIONS AT HOME:  Hydrochlorothiazide.   MEDICATIONS AT THE HOSPITAL:  1. Cefotan 2 g IV q.24h. day #2.  2. Pepcid 40 mg IV q.24h.  3. Synthroid 50 mcg IV daily.  4. Solu-Medrol 80 mg IV daily.  5. Flagyl 500 mg  IV q.8h.  6. Dilantin 200 mg IV q.12h.  7. Bactrim 160 mg IV q.18h.  8. Tylenol p.r.n.  9. Anticoagulant citrate solution p.r.n.  10.      Benadryl p.r.n.  11.      Fentanyl p.r.n.  12.      Ativan p.r.n.  13.      Versed p.r.n.  14.      Phenergan p.r.n.  15.      Zofran p.r.n.  16.      Plasmaphereses x 6 with another treatment scheduled for tonight.   ALLERGIES:  LEVAQUIN.   SOCIAL HISTORY:  [name] is married.  She has three daughters and one  son.  No tobacco or alcohol history.   FAMILY HISTORY:  [name] had nine siblings.  However, one died in an  accidental drowning.  The other eight siblings are all healthy.  She has  multiple family members with diabetes mellitus.  She has one daughter with  rheumatoid arthritis.  She has no family history of blood disorders.   REVIEW OF SYSTEMS:  Unobtainable.   PHYSICAL EXAMINATION:  VITAL SIGNS:  Maximum temperature 102.9 at midnight.  Current temperature 102.2.  Pulse 110-130s.  Blood pressure 90s-140s/40s-  80s.  Current blood pressure 110/60.  Respirations 12 on PRVC with an FiO2  of 40% and O2 saturation of 92%.  Urine output on April 10 was 665 cc. Urine  output on April 11 was 1070 cc.  Urine output since midnight has been 13 cc  an hour.  GENERAL:  [name] is on the ventilator.  She is not responsive.  She  seizing through part of the exam.  HEENT:  Pupils are equal, round and reactive to light and accommodation.  Endotracheal tube is present in the oropharynx.  NECK:  Supple.  HEART:  Tachycardic but regular rhythm.  No murmurs, gallops or rubs.  LUNGS:  Lungs demonstrate rhonchi/upper airway secretions bilaterally.  ABDOMEN:  Soft, obese and nondistended with some hepatomegaly 3-4 cm below  the right costal margin.  Hyperactive bowel sounds.  There are no aortic or  renal bruits appreciated.  She has no gluteal edema.  EXTREMITIES:  Bilateral lower extremities warm with 2+ dorsalis pedis  pulses.  No  cyanosis, clubbing or edema with PAS hose in place.  SKIN:  Scattered petechiae diffusely, greatest in the bilateral upper  extremities.   LABORATORY DATA:  ABG this morning demonstrates a pH of 7.432, PCO2 40, p02  98, bicarb 27 with 97% saturation and 30% FiO2 on PRVC.  CBC demonstrated  white blood cell count of 21.9, hemoglobin 8.6, hematocrit 24.7, platelets  10,000 with 82% neutrophils and a mild left shift, 13% lymphs, 5% monos.  Also demonstrated was polychromasia schistocytes and a mixed red blood cell  population.  Basic metabolic profile reveals a sodium of 140, potassium 3.8,  chloride 103, CO2 24, BUN 49, creatinine 2.8, glucose 225, calcium 7.1,  total protein 4.9, albumin 2.9, SGOT 957, SGPT 565, total protein 4.6 with  direct bilirubin 1.8, indirect bilirubin 2.8, alk phos 45, LDH 3704.  PT  21.5, PTT 33, INR 2.1.  Phenytoin level is 2.8.  Clostridium difficile is  negative.  Respiratory culture reveals more Moraxella catarrhalis, beta-  lactamase positive.  Urine culture from April 10 reveals E coli that is  pansensitive.  Blood cultures from April 10 are no growth to date.  Urinalysis on April 10 revealed 100 glucose, large bilirubin 15 and ketones,  large amount of blood, greater than 300 mg/dl of protein, positive  urobilinogen, bilirubin, nitrite and moderate leukocyte esterase.  Chest x-  ray from this morning reveals cardiomegaly with no infiltrate or effusion.  CT scan of the head no April 11 is negative for acute bleed.  CT scan of the  chest, abdomen and pelvis on April 10 demonstrates a 2 cm hypodense liver  lesion as well as some prominence of the wall of the ascending colon but no  inflammation of the retroperitoneal fat.   ASSESSMENT AND PLAN:  1. [name] is a 65 year old white female with history of hypertension     who now has hemolytic anemia, thrombocytopenia, elevated liver function    tests, seizures, and acute renal failure, not oliguric.  [name]'s     renal function was stable until approximately April 10 when her BUN  bumped to 24 and creatinine bumped to 1.4.  It has been rising steadily     since that time.  This is likely secondary to acute tubular necrosis due     to IV contrast used on April 10, plus/minus hypertension also experienced     that day.  We also must consider thrombotic disease secondary to     thrombotic thrombocytopenic purpura.  However, this normally presents at     the onset of thrombotic thrombocytopenic purpura whereas in this patient     the thrombotic thrombocytopenic purpura picture preceded acute renal     failure.  Vasculitis is also a possible etiology especially with a     history of elevated sed rate.  At this point there is no indication for     hemodialysis.  We will try a maximum of Lasix to increase urine output     and this has to begin in the morning.  Today electrolytes and acid-based     status is within normal limits.  Her volume status is partly positive but     her O2 saturation is okay at present.  We will check a repeat urinalysis     with microscopic examination.  Also check a urine sodium and creatinine     in order to calculate a fractional excretion of sodium.  Check a sed     rate, ANA, and C-ANCA to help elucidate any vasculitic etiologies.  Will     also check a serum protein electrophoresis.  It is possible [name]     could be septic especially with her right femoral central line.  The     sulfa will be discontinued and vancomycin will be started.  [name]     furthermore has cardiomegaly on chest x-ray that could be secondary to     hypertension but could also be secondary to infiltrative process that is     also affecting her liver, bone marrow and kidneys at this point.  A 2-D     echocardiogram will be checked.  2. Thrombotic thrombocytopenic purpura.  [name] is getting     plasmapheresis and prednisone per hematology oncology.  3. Seizures,  likely secondary to underlying process.  [name] is being     seen by neurology for this problem.  4. Hypoglycemia, likely secondary to high dose steroids.  5. Ventilatory dependent respiratory failure.  [name] is being seen     by critical care medicine for management of the ventilator.  Thank you for the consult.  We will follow along with you.       Michel Harrow, M.D.                        Dyke Maes, M.D.    KB/MEDQ  D:  05/13/2002  T:  05/13/2002  Job:  [id number]

## 2010-06-18 NOTE — H&P (Signed)
NAME:  VENISA, FRAMPTON            ACCOUNT NO.:  [id number]   MEDICAL RECORD NO.:  [id number]          PATIENT TYPE:  INP   LOCATION:  2015                         FACILITY:  [facility]   PHYSICIAN:  Francisca December, M.D.  DATE OF BIRTH:  10-11-1945   DATE OF ADMISSION:  03/15/2005  DATE OF DISCHARGE:                                HISTORY & PHYSICAL   REASON FOR ADMISSION:  Atrial fibrillation/congestive heart failure.   Mrs. Tow is a 65 year old female who has been feeling tired, weak, and  just not good over the past month. She went to her physician today  complaining of increasing weight gain of 15 pounds over the past month. She  has bilateral feet swelling as well. On exam she was noted to have an  irregular heart rate and she was sent to [facility] emergency room with a  diagnosis of atrial fibrillation with a controlled ventricular rate. She has  been having some shortness of breath over the past several days. She denies  any chest pain, diaphoresis, and review of systems are otherwise negative.   PAST MEDICAL HISTORY:  Includes:  1.  Hypothyroidism on replacement therapy.  2.  Essential hypertension.  3.  History of lumbar disc surgery.  4.  TTP.  5.  History of splenectomy and needs contact precautions.  6.  She has had multiple surgeries including a thyroidectomy,      cholecystectomy, hysterectomy without oophorectomy, back surgery, right      cataract surgery.  7.  Diabetes mellitus, diet controlled.   MEDICATIONS ON ADMISSION:  1.  Cartia 180 mg a day.  2.  Levoxyl 75 mcg a day.  3.  Metoprolol 25 mg twice a day.  4.  Halcyon 0.25 mg q.h.s.  5.  Gabapentin 300 mg t.i.d.  6.  Citalopram 20 mg daily.   ALLERGIES:  1.  LEVAQUIN causes anaphylaxis.  2.  PENICILLIN causes welts.  3.  RELAFEN causes a decrease in her platelets.   SOCIAL HISTORY:  She is married, four children. No tobacco, alcohol, or  illicit drug use. [place]   FAMILY HISTORY:  She has nine siblings with many of the family members  having diabetes. She has one daughter with rheumatoid arthritis.   PHYSICAL EXAMINATION:  VITAL SIGNS:  Pulse 80, blood pressure 140/90,  respirations 20, temperature 98.0.  GENERAL:  She is a 65 year old pleasant, obese woman in no acute distress.  HEENT:  Grossly normal. No carotid or subclavian bruits. No JVD or  thyromegaly.  CHEST:  Clear to auscultation bilaterally. No wheezing or rhonchi.  HEART:  Irregularly irregular with no murmur.  ABDOMEN:  Good bowel sounds, nontender, nondistended, no masses.  EXTREMITIES:  Show 1-2+ mostly nonpitting edema bilaterally.  NEUROLOGIC:  Grossly intact. She does have some memory loss.  SKIN:  Warm and dry.   EKG shows atrial fibrillation with controlled ventricular rate, otherwise  normal. Chest x-ray shows cardiomegaly but nonacute.   IMPRESSION AND PLAN:  1.  New-onset atrial fibrillation with controlled ventricular response.  2.  Volume overload/congestive heart failure.  3.  Thrombotic  thrombocytopenic purpura.  4.  History of thyroidectomy, now on replacement therapy.  5.  Status post splenectomy.  6.  Diabetes mellitus, diet controlled.  7.  Essential hypertension.  8.  Multiple surgeries as listed above.  9.  Multiple allergies including LEVAQUIN, PENICILLIN, and RELAFEN.   At this point, she will be placed on contact precautions because of her  history of splenectomy. We will continue to follow cardiac enzymes and  laboratory studies. We will go ahead and heparinize her for CVA prophylaxis.  A 2-D echocardiogram will be scheduled for March 16, 2005, and then  depending on what the echocardiogram shows we may proceed with heart  catheterization, TEE/DCCV, or anticoagulate her for 1 month and then bring  her back for cardioversion if she does not chemically convert.   The patient has been seen and examined by Dr. Amil Amen.      Guy Franco,  P.A.      Francisca December, M.D.  Electronically Signed    LB/MEDQ  D:  03/16/2005  T:  03/16/2005  Job:  [id number]   cc:   Genene Churn. Cyndie Chime, M.D.  Fax: [phone]   Ilsa Iha, MD

## 2010-06-18 NOTE — Discharge Summary (Signed)
NAME:  [name], [name]                      ACCOUNT NO.:  [id number]   MEDICAL RECORD NO.:  [id number]                   PATIENT TYPE:  INP   LOCATION:  3021                                 FACILITY:  [facility]   PHYSICIAN:  Genene Churn. Cyndie Chime, M.D.          DATE OF BIRTH:  [birth year]   DATE OF ADMISSION:  06/24/2003  DATE OF DISCHARGE:  06/26/2003                                 DISCHARGE SUMMARY   HISTORY:  The patient is a 65 year old woman with hypertension and  hypothyroidism diagnosed with TTP in April 2004.  She had a stormy hospital  course with multiple complications.  Please see history and physical for  full details.  She eventually achieved a complete remission.  She required a  splenectomy as part of her treatment.   We have been monitoring blood counts closely in our office on a monthly  basis and everything has been stable.  She has a reactive thrombocytosis  which is normal post splenectomy.  She was supposed to be taking one aspirin  daily (although we do not feel that post splenectomy thrombocytosis is a  risk factor for thrombosis).   She presented four days prior to the current admission with acute onset of  left lower extremity weakness.  Initial exam confirmed weakness in the left  foot in extension and in the left hip flexors.  Laboratory evaluation showed  no evidence for recurrent TTP.  A CT scan of the brain done as an outpatient  on an urgent basis on May 20 at [facility] showed no abnormalities.   We elected to admit her for further evaluation.   HOSPITAL COURSE:   PHYSICAL EXAMINATION:  GENERAL:  On exam on Jun 24, 2003, stable neurologic  findings of the left lower extremity with no new findings.  VITAL SIGNS:  Blood pressure 164/86, pulse 69 and regular.  Subsequent blood  pressures in the hospital ranging between 133/75 and 166/84 on outpatient  medication regimen.   MRI and MRA of the brain was done and showed no evidence for acute infarct.  Mild atherosclerotic disease.  Carotid Doppler studies showed no major  obstruction to flow or significant plaques in the internal carotid arteries,  external carotid arteries, vertebral arteries, or common carotid arteries.   Neurology and rehabilitation consultations were obtained.  Given the  negative findings on the brain and carotid studies, working hypothesis was  that there was not a central origin to her neurologic findings, that perhaps  this represented left peroneal nerve compression.  The patient has had prior  disk surgery.  She is not having any back pain at this time.  She told me  that she did not have back pain when her disk problem was evaluated but just  leg pain.   Repeat laboratory analysis compared with Jun 20, 2003 showed no changes and  no evidence for recurrent TTP.  I reviewed her peripheral blood __________.  There were no schistocytes.  Hemoglobin 13.5 with hematocrit 40, white count  13,000, 51 neutrophils, 40 lymphs, retic count of 1.2% with serum bilirubin  of 0.3, LDH 136, pro-time 12.3, PTT 28 seconds.  The remainder of  chemistries normal except for random glucose borderline elevated at 124.   The patient's neurologic status remained stable.  Pending final review of  brain studies by the neurologist, she is stable for discharge today.   CONSULTATIONS:  Rehabilitation medicine.   PROCEDURES:  1. MRI and MRA of the brain.  2. Carotid Doppler studies.   DISCHARGE DIAGNOSES:  1. Left lower extremity weakness, rule out peroneal nerve compression.  2. Thrombotic thrombocytopenic purpura in remission.  3. Hypothyroidism on replacement.  4. Essentially hypertension.  5. History of lumbar disk surgery.   DISPOSITION:  1. Condition stable at the time of discharge.  2. Resume regular activity.  3. Regular diet.  4. Followup with me in one month.   DISCHARGE MEDICATIONS:  1. Coated aspirin 325 mg p.o. daily.  2. Synthroid 0.075 mg daily.  3. Folic  acid 1 mg daily.  4. Cardizem CD 180 mg daily.  5. Metoprolol 25 mg b.i.d.                                                Genene Churn. Cyndie Chime, M.D.    Lottie Rater  D:  06/26/2003  T:  06/27/2003  Job:  [id number]   cc:   Joycelyn Rua, M.D.  [address] [place]  [place] [address]  Fax: [phone]   Melvyn Novas, M.D.  [address]  [place]  [place] [address]  Fax: [phone]   Ranelle Oyster, M.D.  [address] [place]  [place] [address]  Fax: [phone]

## 2010-06-18 NOTE — Op Note (Signed)
NAME:  [name], [name]                      ACCOUNT NO.:  [id number]   MEDICAL RECORD NO.:  [id number]                   PATIENT TYPE:  INP   LOCATION:  2307                                 FACILITY:  [facility]   PHYSICIAN:  Velora Heckler, M.D.                DATE OF BIRTH:  April 28, 1945   DATE OF PROCEDURE:  05/27/2002  DATE OF DISCHARGE:                                 OPERATIVE REPORT   PREOPERATIVE DIAGNOSIS:  Refractory thrombotic thrombocytopenic purpura  (TTP).   POSTOPERATIVE DIAGNOSES:  Refractory thrombotic thrombocytopenic purpura  (TTP).   PROCEDURE:  Open splenectomy.   SURGEON:  Velora Heckler, M.D.   ASSISTANT:  Thornton Park. Daphine Deutscher, M.D.   ANESTHESIA:  General per Dr. Noreene Larsson and Dr. Jacklynn Bue.   ESTIMATED BLOOD LOSS:  250 mL.   PREPARATION:  Betadine.   COMPLICATIONS:  None.   INDICATIONS:  The patient is a 65 year old white female who has been  hospitalized for nearly three weeks with medically refractory thrombotic  thrombocytopenic purpura.  The patient has failed all aggressive medical  interventions.  She has developed multiple complications.  She now comes to  surgery for splenectomy for salvage attempt.   PROCEDURE:  The procedure was done in OR #16 at the [facility]  The patient was brought to the operating room directly from the  intensive care unit on the ventilator.  She was placed in the supine  position on the operating room table.  After induction of general  anesthesia, the patient was prepped and draped in the usual strict aseptic  fashion.  After ascertaining that an adequate level of anesthesia had been  obtained, a left subcostal incision was made with a #10 blade.  Dissection  was carried down through the subcutaneous tissues using the electrocautery  for hemostasis.  The abdominal wall was divided in layers using  electrocautery for hemostasis.  The peritoneal cavity was entered  cautiously.  The left upper  quadrant was explored.  The spleen is relatively  small and appears grossly normal.  The splenic flexure of the colon is  mobilized using the electrocautery to lyse the peritoneal attachments and  achieve hemostasis.  The peritoneal surfaces around the spleen are incised  with the electrocautery.  Small vessels to the inferior pole of the spleen  are divided between [place] clamps and ligated with 2-0 silk ties.  The short  gastric vessels are then divided between [place] clamps and ligated with 2-0  silk ties and large Ligaclips.  The posterior ligamentous attachments of the  spleen to the diaphragm are incised with the electrocautery.  The splenic  hilum is then addressed.  It is divided in segments between [place] clamps and  ligated with 2-0 silk ties and 2-0 silk suture ligatures.  The spleen is  completely removed and passed off the field.  Good hemostasis is noted in  the left upper quadrant.  The left upper quadrant is copiously irrigated  with warm saline.  All vessels had been suture ligated with 2-0 silk suture  ligatures.  Good hemostasis is noted.  Packs are removed.  Counts are  correct.  The abdominal wall is then closed in two layers with running #1  Novofil sutures.  The subcutaneous tissues are irrigated.  The skin is  closed with stainless steel staples.  Sterile dressings are applied.   At this point, Dr. Karle Plumber came to the operating room to change  vascular access from the right groin to a subclavian position.  After this  procedure, the patient will be transported directly from the operating room  back to the surgical intensive care unit on the ventilator.  The patient  tolerated the splenectomy well.                                               Velora Heckler, M.D.    TMG/MEDQ  D:  05/27/2002  T:  05/28/2002  Job:  [id number]   cc:   Genene Churn. Cyndie Chime, M.D.  [address] [facility]  [place]  [place] [address]  Fax: [phone]

## 2010-06-18 NOTE — Op Note (Signed)
NAME:  [name], [name]            ACCOUNT NO.:  [id number]   MEDICAL RECORD NO.:  [id number]          PATIENT TYPE:  INP   LOCATION:  2015                         FACILITY:  [facility]   PHYSICIAN:  Francisca December, M.D.  DATE OF BIRTH:  May 31, 1945   DATE OF PROCEDURE:  03/18/2005  DATE OF DISCHARGE:                                 OPERATIVE REPORT   PROCEDURE PERFORMED:  Elective cardioversion.   INDICATION:  A 65 year old woman with previous history of atrial arrhythmia  in the setting of life-threatening TTP. She was readmitted 4 days ago with  heart failure and atrial fibrillation with controlled ventricular response.  She has been anticoagulated for 3-1/2 days now. I have completed the  transesophageal echocardiogram that has well visualized the left atrium and  left atrial appendage. There is no evidence of thrombus. There is no  spontaneous echo contrast. She is to undergo elective cardioversion at this  time.   ANESTHESIA:  Dr. Arta Bruce.  375 mg of pentothal.   PROCEDURE:  While monitoring heart rate, blood pressure, O2 saturation, and  ECG, the patient received 4 transthoracic doses of biphasic energy  synchronized 150-200 joules without success via the anterior and posterior  pads. Finally, a single dose of 200 joules biphasic synchronized via the  paddles, right superior and left inferior chest, successfully converted the  patient to sinus rhythm/sinus bradycardia.   The patient is awakening now without evidence of sequelae.   IMPRESSION:  Successful elective cardioversion, atrial fibrillation to sinus  rhythm.   PLAN:  1.  Will begin a 24-hour infusion of IV amiodarone, with a 150 mg IV bolus      at this time, followed by a 34 cc/hour infusion for 12 hours, then 17 cc      per hour.  2.  Will continue oral anticoagulation and continue IV heparin until      therapeutic.      Francisca December, M.D.  Electronically Signed     JHE/MEDQ  D:  03/18/2005  T:   03/18/2005  Job:  [id number]

## 2010-06-18 NOTE — H&P (Signed)
NAME:  ARNIKA, LARZELERE            ACCOUNT NO.:  [id number]   MEDICAL RECORD NO.:  [id number]          PATIENT TYPE:  INP   LOCATION:  2022                         FACILITY:  [facility]   PHYSICIAN:  Francisca December, M.D.  DATE OF BIRTH:  December 22, 1945   DATE OF ADMISSION:  03/30/2005  DATE OF DISCHARGE:                                HISTORY & PHYSICAL   CARDIOLOGIST:  Dr. Corliss Marcus   CHIEF COMPLAINT:  Weakness, fatigue, presyncope.   This is an interim admission note for Mrs. Kuwahara who is a 65 year old  Caucasian female who was admitted to [facility] two weeks ago with  not feeling well and a 15 pound weight gain.  It was discovered upon  admission that she had an irregular heartbeat and she was diagnosed with new  onset atrial fibrillation.  Upon her previous admission a TEE and  cardioversion was performed and she was converted back to normal sinus  rhythm.  She was given an IV amiodarone load for 24-36 hours and also  received a 7 pound diuresis.  She remained in normal sinus rhythm and was  discharged to home one week ago on March 22, 2005 on Cartia 180 mg daily,  metoprolol 25 mg every 12 hours, and Coumadin 10 mg daily.  Mrs. Lax  returned to the [facility] Emergency Department on yesterday, March 30, 2005, due to a sudden onset at 10 a.m. of feeling weak and fatigued.  She  admits to feeling like she was going to pass out, however, denies syncope.  She denies chest pain and shortness of breath, however, admits to  diaphoresis, nausea, and became lightheaded.  Since her discharge on  March 22, 2005 her husband states that he has checked her heart rate  occasionally and admits that her heart rate has been in the 70s-80s;  however, on yesterday he checked it via her home monitor and indicated that  her heart rate was in the low 50s to high 40s.  During her discharge on  March 22, 2005 her heart rate was 52 via EKG.  She was brought to the  [facility]  Emergency Department by private car on yesterday.  Her EKG upon  arrival revealed sinus bradycardia with a heart rate of 51 bpm.  Her point  of care enzymes were negative x2.  LV EF is 60-65% via 2-D echocardiogram  during her last admission in February of 2007.   PAST MEDICAL HISTORY:  1.  Atrial fibrillation currently maintaining normal sinus rhythm.  2.  Hypothyroidism on replacement therapy.  3.  Essential hypertension.  4.  History of lumbar disk surgery.  5.  TTP.  6.  History of splenectomy and needs contact precautions.  7.  She has had multiple surgeries including a thyroidectomy,      cholecystectomy, hysterectomy without oophorectomy, back surgery, right      cataract surgery.  8.  Diabetes mellitus, diet controlled.   ALLERGIES:  RELAFEN (decreased platelet counts), PENICILLIN (whelps),  LEVAQUIN (anaphylaxis).   HOME MEDICATIONS:  1.  Coumadin 5 mg two tablets daily.  2.  Metoprolol 50  mg one-half tablet every 12 hours.  3.  Vasotec 2.5 mg every 12 hours.  4.  Cartia 180 mg daily.  5.  Synthroid 75 mcg daily.  6.  Folic acid, dosage unknown.   PAST SURGICAL HISTORY:  As stated in the past medical history.   FAMILY HISTORY:  She has nine siblings with many of the family members  having diabetes mellitus.  She has one daughter with rheumatoid arthritis.   SOCIAL HISTORY:  She is married and lives with her husband.  She has four  adult children.  She denies tobacco, alcohol, or illicit drug use.   REVIEW OF SYSTEMS:  Positive for weakness, diaphoresis, fatigue, nausea,  lightheadedness, and presyncope.  She denies chest pain, shortness of  breath, vomiting, and syncope.  All other systems reviewed are otherwise  negative other than what is stated in the HPI.   PHYSICAL EXAMINATION:  GENERAL:  65 year old female tired and weak-  appearing.  NAD.  VITAL SIGNS:  Temperature 97, pulse 53, respirations 16, blood pressure  141/49, SAO2 98% over 2 L.  HEENT:   Unremarkable.  NECK:  No JVD or carotid bruits.  LUNGS:  Breath sounds are equal and clear to auscultation bilaterally  without wheezes, rhonchi, crackles, or rales.  HEART:  Bradycardia.  S1 and S2 normal without murmurs, gallops, clicks, or  rubs.  ABDOMEN:  Soft, nontender, nondistended with active bowel sounds.  No aortic  or iliac bruits.  No hepatomegaly or masses.  EXTREMITIES:  No peripheral edema.  DP and PT pulses 2+/2 bilaterally.  NEUROLOGIC:  Alert and oriented x3.  No focal deficits.  PSYCH:  Flat mood and affect.   LABORATORY DATA:  Point of care cardiac enzymes, myoglobin 123 and 97.4,  respectively, troponin I less than 0.05 and less than 0.05, respectively, CK-  MB less than 1 and 1.1, respectively.  PT 15.7.  INR 1.2.  Sodium 135,  potassium 4.3, chloride 108, CO2 23.4, BUN 17, creatinine 1.2, glucose 121.  I-stat hemoglobin 17.7, I-stat hematocrit 52.  Chest x-ray:  Chronic  cardiomegaly, mild pulmonary vascular prominence.   ASSESSMENT:  1.  Bradycardia secondary to medications.  2.  Subtherapeutic INR.  3.  Weakness/fatigue, questionable cause, ?secondary to #1.  4.  Polycythemia with hemoglobin of 17.7.  5.  TTP.  6.  Essential hypertension.  7.  Hypothyroidism.  8.  Diabetes mellitus.  9.  Status post splenectomy.  Needs contact isolation.   PLAN:  1.  Admit to cardiac telemetry unit under Dr. Corliss Marcus primary service      with a private room.  Status post splenectomy.  Needs contact isolation.  2.  Hold Cartia 180 mg if heart rate is less than 60 beats per minute.  3.  Hold metoprolol 50 mg one-half tablet every 12 hours if heart rate is      less than 60 beats per minute.  4.  Check TSH.  5.  Check EKG and BMET in a.m.  6.  Warfarin per pharmacy protocol due to subtherapeutic INR.  7.  Initiate cardiology p.r.n. orders.  8.  Recheck hemoglobin.      Tylene Fantasia, [place]      Francisca December, M.D. Electronically Signed    RDM/MEDQ  D:   03/31/2005  T:  03/31/2005  Job:  [id number]

## 2010-06-18 NOTE — H&P (Signed)
NAME:  [name], [name]                      ACCOUNT NO.:  [id number]   MEDICAL RECORD NO.:  [id number]                   PATIENT TYPE:  INP   LOCATION:  XRAY                                 FACILITY:  [facility]   PHYSICIAN:  Genene Churn. Cyndie Chime, M.D.          DATE OF BIRTH:  19-Jul-1945   DATE OF ADMISSION:  06/24/2003  DATE OF DISCHARGE:                                HISTORY & PHYSICAL   CHIEF COMPLAINT:  A 48-hour history of left leg weakness in a lady with a  history of  TTP.   HISTORY OF PRESENT ILLNESS:  [name] is a complicated 65 year old  woman who presented in April of 2004 with a three month history of atypical  abdominal pain and an acute neurologic change with expressive aphagia and a  presyncopal episode.  She reported to the [facility] Emergency Department,  where she was found to be profoundly anemic and thrombocytopenic.  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  She was transferred to [facility] for urgent  treatment.  A vascular access catheter was placed and she was started on a  program of intensive plasma exchange and steroids.  Within 48 hours of  admission she developed refractory grand mal seizures which lasted for about  one week, despite multiple anticonvulsants.  She also developed refractory  atrial arrhythmias requiring a Cardizem continuous infusion.  Plasmapheresis  was continued through these crises.  There was no improvement in her  platelet count.  Plasma exchange was increased to twice daily, also with no  improvement.  She received Rituxan anti B cell antibody treatment with no  improvement.  She was taken to splenectomy on May 27, 2002.  Plasma  exchange was continued.  Within about one week of the splenectomy her  platelet count started to come up and reached the normal range.  However,  attempts at trying to wean her off the plasma exchange met with fall in her  platelet  count.  She was transferred briefly to [facility].  No additional therapy other than continuing the plasma exchange was done.  After about an additional week of treatment she was able to get off plasma  exchange and maintain a complete hematologic response which has lasted up  until the present time.   She called our office Friday afternoon, May 20, complaining of a two-day  history of acute onset of left foot drop and left leg weakness.  On my exam  I was able to confirm 4/5 weakness in the hip flexor muscles on the left and  3+/5 weakness in extension at the left ankle.  There were no other focal  deficits.  Urgent CT scan of the brain was done which I personally reviewed  with the radiologist Friday evening and this showed no acute intracranial  abnormality.  Laboratory studies showed a hemoglobin of 14, platelet count  of 543,000 (consistent  with a bromal post splenectomy person), a bilirubin  of 0.7, LDH 140, reticulocyte count 1.8%.  No schistocytes were seen on the  peripheral blood film.   I felt that she probably had a completed stroke at that point.  She was  supposed to be taking one aspirin daily.  She admitted that she really had  been erratic in taking the aspirin and promised to go back on a daily dose.  I scheduled her for a MRI of the brain for Monday, May 23.  She failed to  report.  We called her and she refused to reschedule, saying she could not  afford to pay for another test.  We called her again today after I discussed  the case with Dr. Sharene Skeans, who was kind enough to call me when he received  my office note from May 20.  We both felt that this lady would best be  served by hospital admission for an expeditious evaluation of her new  neurologic deficit in view of her complicated past history.   PAST MEDICAL HISTORY:  1. Past history also includes hypothyroidism on replacement.  2. Type 2 diabetes, previously on oral agent, currently on no  medication.  3. Essential hypertension controlled with a beta blocker.  4. Previous cholecystectomy.  5. Right cataract surgery.  6. Hysterectomy without oophorectomy.  7. Back surgery after a fall.   Other complications during her admission for TTP included MRSA, septicemia,  and near the end of the admission, proteus urinary tract infection.   CURRENT MEDICATIONS:  1. Aspirin 325 mg daily.  2. Folic acid 1 mg daily.  3. Synthroid 0.075 mg daily.  4. Lopressor 25 mg b.i.d.  5. Cardizem CD 180 mg daily.   ALLERGIES:  1. She is allergic to [facility].  2. Possibly ROCEPHIN with an isolated episode of thrombocytopenia in the     past which antedated the diagnosis of her TTP with full platelet recovery     when these medications were stopped.   FAMILY HISTORY:  Nine siblings.  One brother died at age 65 of drowning.  Many family members with diabetes.  No blood disorders.  One daughter, age  67, with rheumatoid arthritis.   SOCIAL HISTORY:  Married, three druthers and a son.  No alcohol or tobacco.   REVIEW OF SYSTEMS:  Last week when she had onset of the leg weakness she  also had a transient feeling that there was a tight band around her head  which only lasted for about a minute.  She had some intermittent blurred  vision.  These symptoms have resolved.  She reports a sensation that her  head is stuffy like when she is up in the mountains.  She denies any  dyspnea, no chest pain, chest pressure, palpitations, abdominal pain,  nausea, vomiting, diarrhea, melena, hematochezia, dysuria, hematuria.   PHYSICAL EXAMINATION:  GENERAL:  Pleasant woman in no acute distress.  VITAL SIGNS:  The pulse is 69 and regular, blood pressure 164/86,  respirations 18, temperature 98.6, weight 278 pounds.  SKIN/HAIR/NAILS:  Normal.  HEENT:  Pupils equal and reactive to light.  Optic discs sharp.  Vessels  normal.  No hemorrhage or exudate.  Pharynx:  No erythema or exudate. NECK:  Supple.  No  thyromegaly.  Carotids 2+, no bruits.  LUNGS:  Clear.  Resonant to percussion.  HEART:  Regular cardiac rhythm.  No murmur.  LYMPHATICS:  No lymphadenopathy.  BREASTS:  Not examined.  ABDOMEN:  Soft, obese.  Nontender.  No mass, no organomegaly.  She is status  post splenectomy.  EXTREMITIES:  No edema.  No calf tenderness.  NEUROLOGIC:  Mental status intact.  Cranial nerves grossly normal.  Right  subconjunctival hemorrhage which has almost completely resolved compared  with my exam on Friday.  The motor strength is 5/5 both upper extremities  and the right lower extremity, but decreased motor strength in the left  ankle and left hip flexors not significantly changed from exam four days  ago.  Reflexes \\2 + symmetric at the biceps, absent symmetric at the knees.  Finger-to-nose, finger-to-hand, finger-to-finger coordination all normal.  Gait not tested.  Sensation is intact to pin and moderately decreased to  vibration over the hands.   IMPRESSION:  1. Acute neurologic change with sudden onsets of left lower extremity     weakness.  2. History of thrombotic thrombocytopenic purpura.  3. Essential hypertension.  4. Type 2 diabetes, diet controlled.  5. Hypothyroid on replacement.   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  No  evidence at present to suggest a recurrence of her TTP.   PLAN:  She is admitted to expedite her evaluation with MRI and MR  angiography of the brain, carotid Doppler studies, consider echocardiogram.  Neurology consultation.  Continue aspirin and other medications.    /                                                Genene Churn. Cyndie Chime, M.D.    Lottie Rater  D:  06/24/2003  T:  06/24/2003  Job:  [id number]   cc:   Joycelyn Rua, M.D.  [address] [place]  [place] [address]  Fax: [phone]   Melvyn Novas, M.D.  [address]  [address]  [place]  [place] [address]  Fax: [phone]

## 2010-06-18 NOTE — Consult Note (Signed)
NAME:  [name], [name]                      ACCOUNT NO.:  [id number]   MEDICAL RECORD NO.:  [id number]                   PATIENT TYPE:  INP   LOCATION:  2307                                 FACILITY:  [facility]   PHYSICIAN:  Velora Heckler, M.D.                DATE OF BIRTH:  1945/04/06   DATE OF CONSULTATION:  05/21/2002  DATE OF DISCHARGE:                                   CONSULTATION   REASON FOR CONSULTATION:  Severe refractory thrombotic thrombocytopenic  purpura.   HISTORY OF PRESENT ILLNESS:  Patient is a 65 year old white female admitted  to Dr. Cephas Darby on May 07, 2002, from the primary care physician's  office at [facility].  Patient had a three-month history of atypical abdominal  pain.  She had had CT scans and MRI scans of the abdomen, which were  reported as normal.  On the day of admission, the patient experienced a  presyncopal episode associated with paresthesias in the hands.  She was seen  at [facility]; laboratory studies showed severe thrombocytopenia  with a platelet count of 8000.  Patient was sent to the emergency department  at [facility] where she was seen by Dr. Cephas Darby.  Laboratories and blood smears were evaluated; diagnosis of thrombotic  thrombocytopenic purpura was made.  Patient was admitted on the oncology  service.  A plasma phoresis catheter was inserted by Dr. Liliane Bade from  [facility].  Plasma phoresis was performed on May 08, 2002.  Patient's course was  complicated by onset of seizures.  She developed respiratory failure and  required mechanical ventilation.  She developed acute renal failure.  She  developed cardiac arrhythmias with onset of atrial flutter.  Despite maximal  medical support, the patient's condition continued to deteriorate.  She did  have a slight reprieve in her platelet count, but, again, platelets have  fallen to dangerously low levels, despite aggressive management.  Patient  began an  infusion of Rituxan on May 20, 2002.  General surgery is  consulted at this time for the possibility of splenectomy as a therapy of  last resort in this critically ill patient.   PAST MEDICAL HISTORY:  Status post open cholecystectomy, status post total  abdominal hysterectomy, status post multiple spinal surgeries, status post  cataract surgery, status post what appears to be partial thyroidectomy,  history of hypertension.   MEDICATIONS AT HOME:  Hydrochlorothiazide.   ALLERGIES:  To LEVAQUIN.   SOCIAL HISTORY:  Patient is married.  She has four children.  She denies  alcohol or tobacco use.  She has several grandchildren.   FAMILY HISTORY:  Patient is one of nine children.  There are multiple family  members with diabetes.  Patient has one daughter with rheumatoid arthritis.  There is no family history of hematologic disease.   REVIEW OF SYSTEMS:  Patient is in a comatose state and is not responsive.  Previous  review of systems by other physicians is documented in the medical  record.   PHYSICAL EXAMINATION:  GENERAL:  A 65 year old morbidly obese white female  in the intensive care unit at [facility].  VITAL SIGNS:  Show temp 100.2, pulse 80, in atrial flutter, blood pressure  145/65, O2 saturation 99% on 30% O2 by ventilator.  HEENT:  Shows her to be normocephalic, atraumatic.  There are a few small  areas of ecchymosis around the neck.  Pupils are 5 mm bilaterally and  reactive.  Anterior examination of the neck shows a well-healed Kocher  incision.  There are no palpable abnormalities and no palpable masses.  There is no palpable adenopathy.  CHEST:  Examination shows good breath sounds bilaterally.  There is a CVP  catheter in the left subclavian position.  CARDIAC EXAM:  Shows a regular rate and rhythm with a rate of approximately  80.  On the monitor, however, there is obvious atrial flutter.  ABDOMEN:  Soft, obese.  There are bowel sounds present on  auscultation.  There are multiple surgical wounds in the right upper quadrant and right mid  flank, which are well healed.  There is a redundant panniculus.  There is a  right femoral catheter in position.  EXTREMITIES:  Show 1+ edema.  They appear warm and well perfused.  NEUROLOGICAL:  The patient is nonresponsive to voice or noxious stimuli.  She does have some eye fluttering.  There is no purposeful movement.   LABORATORY DATA:  White count 10.4, hemoglobin 7.5, platelet count 19,000.  Electrolytes are normal; creatinine is 1.1, BUN is 35.  ABG on 30% by  mechanical ventilation is a PO2 of 72, PCO2 of 38, pH 7.53, and bicarb 31.   IMPRESSION:  1. Severe refractory thrombotic thrombocytopenic purpura.  Patient with     persistent thrombocytopenia, despite maximal medical management.  Asked     to evaluate for possibility of exploratory laparotomy and splenectomy as     a therapy of last resort.  Certainly, in this critically ill patient, any     operative intervention would be at great risk for bleeding, cardiac     complications, or anesthetic complications.  Certainly, exploratory     laparotomy would be a life-threatening event; however, in the absence of     alternative therapies for this deteriorating patient, splenectomy may be     treatment of last resort.  I will ask anesthesia to evaluate the     patient's anesthetic risk in the setting of severe thrombocytopenia and     atrial flutter.  2. Ventilator-dependent respiratory failure.  3. Seizure disorder with altered level of consciousness.  4. Morbid obesity.  5. Atrial flutter.  6. Acute renal failure, improved.                                               Velora Heckler, M.D.    TMG/MEDQ  D:  05/21/2002  T:  05/21/2002  Job:  [id number]   cc:   Genene Churn. Cyndie Chime, M.D.  [address] [facility]  [place]  [place] [address]  Fax: [phone]

## 2010-06-18 NOTE — Discharge Summary (Signed)
NAME:  [name], [name]            ACCOUNT NO.:  [id number]   MEDICAL RECORD NO.:  [id number]          PATIENT TYPE:  INP   LOCATION:  2022                         FACILITY:  [facility]   PHYSICIAN:  Francisca December, M.D.  DATE OF BIRTH:  Apr 23, 1945   DATE OF ADMISSION:  03/30/2005  DATE OF DISCHARGE:  04/05/2005                                 DISCHARGE SUMMARY   ADMISSION DIAGNOSIS:  Weakness, fatigue, presyncope.   DISCHARGE DIAGNOSES:  1.  Weakness, fatigue and presyncope resolved; however, still with some mild      nausea.  2.  Atrial fibrillation, currently maintaining normal sinus rhythm.  3.  Hypothyroidism, on replacement therapy.  4.  Essential hypertension.  5.  History of lumbar disk surgery.  6.  Thrombotic thrombocytopenic purpura.  7.  History of splenectomy and needed contact precautions.  8.  She had had multiple surgeries, including thyroidectomy,      cholecystectomy, hysterectomy without oophorectomy, back surgery, right      cataract surgery.  9.  Diabetes mellitus, diet controlled.   PROCEDURES:  None.   HOSPITAL COURSE:  [name] is a 65 year old Caucasian female who was  recently discharged from the hospital on March 22, 2005, status post  atrial fibrillation, currently maintaining normal sinus rhythm, status post  a TEE and cardioversion. She had been discharged to home in stable condition  after 24-36 hours of IV amiodarone load, and had also received a 7 pound  diuresis. She remained in normal sinus rhythm prior to discharge and went  home on Coumadin 10 mg daily and Vasotec 2.5 every 12 hours. Those new  medications were added to her previous home regimen of Cartia 180 mg daily  and metoprolol 25 mg daily. A week later, on February 28, she reported a  sudden onset of feeling weak and fatigued, as if she was going to pass out.  She denied chest pain or shortness of breath; however, admitted to  diaphoresis and nausea, and became very lightheaded.  Her husband stated that  he occasionally checked her heart rate at home, and that since her discharge  on February 20, that her heart rate had been in the 70s to 80s, and that on  the day she became ill, her heart rate was in the low 50s to 40s. It should  be noted that on her discharge on March 22, 205, her heart rate was 52  per minutes via EKG. She was brought into the [facility] Emergency  Department by private car and her EKG upon arrival revealed sinus  bradycardia with a heart rate of 51 BPM. Her plan of care of serial enzymes  during her hospital stay were negative. During her last hospital stay in  February 2007, she had a 2D echocardiogram, which revealed a normal LV  function with an EF of 60% to 65%. During this hospital stay, due to her low  heart rate, her metoprolol and Cartia were held for heart rate of less than  70 beats per minute. The patient averaged a heart rate in the low 50s, high  60s, with occasional sinus tachycardia  as high as 128 beats per minute and  as low as 48 beats per minute.   During this admission, the patient frequently reported bouts of dizziness  that were inconsistent with events on the monitor. She also reported nausea.  Her dizzy spells would last no more than 20 minutes, although the patient  reported having a feeling that would come over her and never leave.  Orthostatic blood pressures were obtained during her dizzy spells; however,  the patient was not orthostatic during those events.   Also upon admission, the patient's INR was subtherapeutic. The patient's  Coumadin was dosed per pharmacy protocol and her INR to date was  therapeutic. The patient was being discharged today, on March 6, to home in  stable condition, with Phenergan for nausea and on a low dose beta blocker.  The patient had been instructed to increase her activity slowly, as she  experienced sinus tachycardia on occasion with minimal activity. The patient  was also  deconditioned. The patient had requested a permanent pacemaker;  however, the etiology of her symptoms were truly unclear, and we were not  sure if a pacemaker will clearly help her symptoms.   LABORATORY DATA:  Most recent BMET - sodium 137, potassium 3.7, chloride  105, CO2 23, glucose 96, BUN 14, creatinine 1.1, calcium 8.8. PT 20.8. INR  1.8. TSH 1.109. Cardiac markers. Myoglobin 123 and 97.4 respectively. CK-MB  less than 1.0 and 1.1 respectively. Troponin I less than 0.05 and less than  0.05 respectively. UA - negative. White blood cells 11.1, hemoglobin 15.6,  hematocrit 46.5, platelets 618, red blood cells 5.08. X-rays - Chest x-ray,  single view, March 30, 2005, chronic cardiomegaly, mild pulmonary  vascular prominence. CT of the head with and without contrast medium - no  acute abnormality. Most recent EKG - normal sinus rhythm with a ventricular  rate of 93 beats per minute.   CONDITION UPON DISCHARGE:  The patient was deconditioned; however, she was  being discharged to home in stable condition.   DISCHARGE MEDICATIONS:  1.  Coumadin 5 mg daily, new prescription given with refills.  2.  Phenergan 12.5 mg every 6 hours as needed for nausea, new prescription      given with refills.  3.  Metoprolol 12.5 mg twice daily, new prescription given with refills.  4.  Vasotec 12.5 mg every 12 hours.  5.  Synthroid 75 mcg daily.  6.  Folic acid 1 mg daily.  7.  The patient was told to discontinue Cartia 180 mg, effective      immediately.   DISCHARGE INSTRUCTIONS:  The patient should follow a heart healthy diet with  low salt, low fat and low cholesterol. The patient had also been instructed  to call the M.D. with recurrent weakness, fatigue or presyncope.   FOLLOWUP INSTRUCTIONS:  1.  The patient had been scheduled to follow up with Riki Rusk in the Coumadin      Clinic at [facility] for PT and INR check on Friday, April 08, 2005, at 1:45 p.m. any adjustments in  Coumadin will be made at that      time. She was scheduled to call [phone] for any changes in that      appointment.  2.  The patient had also been scheduled to follow up with Dr. Amil Amen at the      [facility] office on April 12, 2005, at 11:20 a.m. for a hospital  followup appointment. The patient had been scheduled to call [phone]      for any changes in that scheduled appointment.      Tylene Fantasia, [place]      Francisca December, M.D.  Electronically Signed    RDM/MEDQ  D:  04/05/2005  T:  04/06/2005  Job:  [id number]

## 2010-06-18 NOTE — Consult Note (Signed)
NAME:  [name], [name]                      ACCOUNT NO.:  [id number]   MEDICAL RECORD NO.:  [id number]                   PATIENT TYPE:  INP   LOCATION:  2307                                 FACILITY:  [facility]   PHYSICIAN:  Vida Roller, M.D.                DATE OF BIRTH:  August 03, 1945   DATE OF CONSULTATION:  05/18/2002  DATE OF DISCHARGE:                                   CONSULTATION   REASON FOR CONSULTATION:  Atrial flutter with rapid ventricular response.   HISTORY OF PRESENT ILLNESS:  The patient is a 65 year old white female with  a history of hypertension who was found on April 7 to have what is suspected  to be TTP, thrombotic thrombocytopenic purpura.  She was admitted to [facility] medical center for definitive evaluation.  She has currently undergone  plasmapheresis, has had a very difficult course since her admission on  05/07/2002.  She is currently intubated and currently undergoing twice-a-day  plasmapheresis, is having significant problems with renal insufficiency and  sepsis.  We were asked to assess for the etiology and potential therapeutic  impact on atrial flutter with rapid ventricular response.   PAST MEDICAL HISTORY:  1. Hypertension.  2. Cholecystectomy.  3. Abdominal hysterectomy.  4. Cataract surgery.  5. Back surgery after a fall.  6. Benign tumors removed from neck.   MEDICATIONS IN HOSPITAL:  1. Cefotetan 2 g q.24h.  2. Pepcid 40 mg IV q.24h.  3. Synthroid 50 mcg IV daily.  4. Solu-Medrol 80 mg IV daily.  5. Flagyl 500 mg IV q8h.  6. Dilantin 200 mg IV q.6h.  7. Bactrim 160 mg IV q.18h.  8. Tylenol as needed.  9. Anticoagulant citrate solution.  10.      A variety of p.r.n. medications including Versed, Ativan, Fentanyl,     Benadryl, Phenergan, and Zofran.   ALLERGIES:  LEVAQUIN.   SOCIAL HISTORY:  She is married.  She has three daughters and one son.  She  does not smoke.  She does not use alcohol.   FAMILY HISTORY:  She is one of  nine siblings, one of whom died of accidental  drowning, but all others are alive and healthy.   REVIEW OF SYSTEMS:  Unobtainable.   PHYSICAL EXAMINATION:  GENERAL:  Obese white female, intubated, and  unresponsive and deeply sedated.  VITAL SIGNS:  Pulse 140 in atrial flutter with a variable block on  telemetry.  Blood pressure is 160/78 on arterial monitor.  She is ventilated  and is saturating at 97%.  HEENT:  Examination unremarkable aside from the endotracheal tube in place.  NECK:  Supple.  I cannot see her jugular veins.  Thyroid is not palpable.  HEART:  Tachycardic with irregularly irregular rhythm.  LUNGS:  Coarse sounds bilaterally.  ABDOMEN:  Obese and appears to be nondistended.  EXTREMITIES:  Lower extremities have 2 to 3+ edema throughout.  SKIN:  Shows only scattered petechiae on bilateral upper extremities.   LABORATORY DATA:  Relative extensive and available, but essentially she has  a white blood count that is elevated.  She has a moderate anemia and marked  thrombocytopenia.  Electrolytes today show white blood cell count 7.2,  hemoglobin 5.9, hematocrit 21.6, platelet count 51,000.  Sodium 151,  potassium 3.7, chloride 109, bicarbonate 38, BUN 48, creatinine 1.7,  magnesium not recorded.  Most recent blood gas shows a pH of 7.53 with a  PCO2 of 44 and PO2 of 92.   Most recent chest x-ray shows endotracheal in satisfactory condition.  The  left subclavian is stable.  She has a left lower lobe infiltrate which is  slightly improved.  Right lung segmental atelectasis essentially unchanged  from previous.  That was done on 05/16/2002.   Electrocardiogram was not available for review.   Her most recent echocardiogram done on April 13 shows normal left  ventricular size with overall normal function.  There are no obvious  diagnostic wall motion abnormalities.  Right ventricle looks normal, and  both atria look normal.  Her inferior vena cava is normal, and she has  no  significant mitral regurgitation.   ASSESSMENT:  Atrial flutter with a rapid ventricular response in the setting  of critical illness.  She is currently on Cardizem and digoxin.  I think she  might benefit from the addition of a beta blocker as well.  The question is  if her left ventricular function is still normal, and consideration might be  made to repeat the echocardiogram to look to see if there has been any  change.  Hypertension is longstanding.  Her blood pressure is not terribly  well controlled now, and I suspect the beta blocker may help with that.   So, we recommend repeating her thyroid function studies to make sure they  are normal, adding IV Lopressor 5 mg IV q.4h.  Would continue the IV  Diltiazem, continue the IV digoxin, and we will ask the electrophysiology  service to stop by on Monday to reevaluate her at that time.                                               Vida Roller, M.D.    JH/MEDQ  D:  05/18/2002  T:  05/19/2002  Job:  [id number]   cc:   Genene Churn. Cyndie Chime, M.D.  [address] [facility]  [place]  [place] [address]  Fax: [phone]

## 2010-06-18 NOTE — H&P (Signed)
NAME:  [name], [name]            ACCOUNT NO.:  [id number]   MEDICAL RECORD NO.:  [id number]          PATIENT TYPE:  INP   LOCATION:  2017                         FACILITY:  [facility]   PHYSICIAN:  Francisca December, M.D.  DATE OF BIRTH:  1945-06-17   DATE OF ADMISSION:  06/21/2005  DATE OF DISCHARGE:                                HISTORY & PHYSICAL   REASON FOR ADMISSION:  Weakness and recurrent atrial fibrillation.   HISTORY OF PRESENT ILLNESS:  [name] is a pleasant 65 year old  patient of mine whom I have seen since February of 2007.  She was admitted  at that time with heart failure and rapid ventricular response to atrial  fibrillation.  She responded to usual measures for heart rate slowing and  eventually underwent cardioversion.  She has done well since then.  She was  known to have normal LV systolic function.  A Cardiolite perfusion study  performed in February showed no evidence of inducible left ventricular  ischemia and the EF was 45%.  She was in atrial fibrillation at that time.  A 2-D echocardiogram  performed by myself prior to cardioversion showed  overall normal LV systolic function.   She presented to my office yesterday with complaints of fatigue, weakness,  mild lightheadedness and shortness of breath.  This had been worsening over  about 10 days.  She was seen at a primary care facility where recurrent  atrial fibrillation was documented.  The ventricular response was in the 90  to 100 range.  She was admitted at this time for amiodarone loading,  treatment of mild CHF and anticipated cardioversion.   MEDICATIONS:  1.  Levoxyl 0.075 mg p.o. daily.  2.  Halcion 0.25 mg p.o. nightly.  3.  Celexa 20 mg p.o. daily.  4.  Metoprolol 25 mg p.o. b.i.d.  5.  Warfarin varying dosages.  6.  Furosemide 40 mg p.o. daily.  7.  Lotrel 5/10 one p.o. daily.   ALLERGIES:  PENICILLIN and LEVAQUIN.   PAST MEDICAL HISTORY:  1.  Paroxysmal atrial fibrillation.  2.   Hypothyroidism on replacement.  3.  History of goiter status post resection.  4.  Hypertension.  5.  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  6.  Status post multiple surgeries.  7.  Diabetes mellitus on no oral agent.  8.  Obesity.  9.  Systemic anticoagulation.   SOCIAL HISTORY:  No alcohol or tobacco usage.  She lives at home with her  husband.  She is not currently employed.   FAMILY HISTORY:  Not significant for early coronary disease.   REVIEW OF SYSTEMS:  Negative except as mentioned above.   PHYSICAL EXAMINATION:  GENERAL APPEARANCE:  This is a pleasant, obese 65-  year-old Caucasian woman in no distress.  VITAL SIGNS:  Blood pressure is 103/66, heart rate 87 and irregularly  irregular, respirations 18, temperature 97.5, O2 saturation 95% on room air.  HEENT:  Unremarkable.  Head is atraumatic and normocephalic.  Pupils are  equal, round, reactive to light and accommodation.  Extraocular movements  are intact.  Sclerae are anicteric.  Oral mucosa  pink and moist.  Teeth and  gums in good repair. Tongue is not coated.  NECK:  Supple without thyromegaly or masses.  Carotid upstrokes are normal.  There is no bruit.  There is no JVD.  CHEST:  Clear with adequate excursion.  Normal breath sounds are heard  throughout.  The precordium is quiet.  CARDIOVASCULAR:  Normal S1 and S2 is heard.  Heart sounds are somewhat  muffled.  The heart rhythm is irregular.  No murmur detectable.  ABDOMEN:  Obese, soft, nontender.  No midline pulsatile mass. Bowel sounds  present in all quadrants.  No hepatomegaly.  GU:  External genitalia is without lesions.  RECTAL:  Not performed.  EXTREMITIES:  Full range of motion, no edema and intact distal pulses.  NEUROLOGIC:  Cranial nerves II-XII intact.  Motor and sensory grossly  intact.  Gait not tested.  SKIN:  Warm, dry and clear.   ACCESSORY CLINICAL DATA:  Electrocardiogram shows atrial fibrillation,  ventricular response of 90 beats per  minute.   Chest x-ray shows cardiomegaly, otherwise no significant findings other than  some DJD of the thoracic spine.   Admission hemogram is normal.  Admission electrolytes normal except for a  potassium of 3.3.  INR is 2.7.  BNP 148.   IMPRESSION:  1.  Symptomatic recurrent atrial fibrillation.  2.  Likely associated mild congestive heart failure but without a      significant degree of objective evidence for this.  3.  History of hypothyroidism on replacement.  4.  Hypertension, not a problem today.  5.  Remote history of thrombotic thrombocytopenia purpura.  6.  Splenectomy secondary to above.  7.  Diabetes mellitus not currently treated.   PLAN:  1.  The patient is admitted for telemetry monitoring and will be initiated      on loading dose of IV amiodarone.  2.  Will also receive IV furosemide 80 mg twice daily for at least two to      three days.  Will monitor I&O closely.      Daily weights.  Obtain amiodarone, laboratory work including TSH, liver      functions and pulmonary function testing with DLCO.  3.  Will plan for direct current Cardioversion within the next 24 to 48      hours.      Francisca December, M.D.  Electronically Signed     JHE/MEDQ  D:  06/22/2005  T:  06/22/2005  Job:  [id number]

## 2010-06-18 NOTE — Op Note (Signed)
   NAME:  [name], [name]                      ACCOUNT NO.:  [id number]   MEDICAL RECORD NO.:  [id number]                   PATIENT TYPE:  INP   LOCATION:  2307                                 FACILITY:  [facility]   PHYSICIAN:  Duke Salvia, M.D.               DATE OF BIRTH:  1945-05-14   DATE OF PROCEDURE:  06/03/2002  DATE OF DISCHARGE:                                 OPERATIVE REPORT   PREOPERATIVE DIAGNOSIS:  Atrial fibrillation.   POSTOPERATIVE DIAGNOSIS:  Sinus rhythm.   PROCEDURE:   SURGEON:  Duke Salvia, M.D.   DESCRIPTION OF PROCEDURE:  The patient was submitted to general anesthesia  under the care of Dr. Randa Evens.  A 200 joule shock was delivered  synchronously to atrial fibrillation failing to terminate atrial  fibrillation.  A 360 joule shock was delivered again.  The atrial  fibrillation and all phases of  monophasic wave form was terminated.  This  terminated atrial fibrillation and restored sinus rhythm.   Hemodynamically, the patient tolerated the procedure well.  The patient  remains stable at the time of this dictation.                                               Duke Salvia, M.D.    SCK/MEDQ  D:  06/03/2002  T:  06/03/2002  Job:  [id number]

## 2010-06-18 NOTE — Discharge Summary (Signed)
NAME:  [name], DIGMAN NO.:  [id number]   MEDICAL RECORD NO.:  [id number]          PATIENT TYPE:  INP   LOCATION:  2015                         FACILITY:  [facility]   PHYSICIAN:  Meade Maw, M.D.    DATE OF BIRTH:  [birth year]   DATE OF ADMISSION:  03/15/2005  DATE OF DISCHARGE:  03/22/2005                                 DISCHARGE SUMMARY   DISCHARGE DIAGNOSES:  1.  New onset atrial fibrillation, status post TEE/cardioversion.  2.  Congestive heart failure, resolved.  3.  Hypokalemia, repleted.  4.  Coumadin load.  5.  Hypothyroidism on replacement therapy.  6.  Essential hypertension.  7.  TTP.  8.  History of splenectomy.  9.  Diabetes mellitus.  10. History of lumbar disk surgery.  11. History of multiple surgeries including thyroidectomy, cholecystectomy,      hysterectomy without oophorectomy, back surgery, and right cataract      surgery.  12. Allergy to [facility] which cause anaphylaxis.  13. Allergy to PENICILLIN which cause whelps.  14. Allergy to RELAFEN which cause a decrease in her platelets.   [name] is a 65 year old female that had a one-month history of just  not quite feeling good as well as a 15-pound weight gain over the past  month. She was seen by her primary care physician on the day of admission  and she was noted to have an irregular heart rate and was sent to [facility] for admission. She was documented to have new onset atrial  fibrillation which we suspected started approximately one month ago and  contributed to her congestive heart failure. Her heart rate remained  controlled from a ventricular standpoint. Ultimately she did undergo a  TEE/cardioversion. She had a left atrial thrombus and underwent biphasic  synchronous cardioversion times four with no success. She then underwent a  biphasic synchronous cardioversion that conversion that converted her back  to normal sinus rhythm. She tolerated the procedure well. Post  procedure she  did have an approximately 24-36 hours of IV amiodarone load and then this  was discontinued. She remained in normal sinus rhythm. At that same time she  underwent a Coumadin load and by discharge date her INR was 1.8. By March 22, 2005, she was ready for discharge to home.   Please note that during the patient's hospitalization she did undergo a  Cardiolite that showed a questionably reversible lateral wall defect with  the patient wanting to wait and have this worked up once she recovered from  this hospitalization.   MEDICATIONS:  The patient was discharged to home on the following  medications:  1.  Cartia 180 mg daily.  2.  Levoxyl 75 mcg daily.  3.  Halcion 0.25 mg at bedtime.  4.  Gabapentin 300 mg t.i.d.  5.  Celexa 20 mg daily.   New medications include Vasotec 2.5 mg q.12h., metoprolol 25 mg q.12h.,  Coumadin 10 mg daily. She is to have a Coumadin Clinic check on March 24, 2005, at 10:45 a.m. and she is to call Dr. Amil Amen' office for an  appointment to  see him in two weeks. She is to call if she has any further  palpitations, chest pain, or any concerns.      Guy Franco, P.A.      Meade Maw, M.D.  Electronically Signed    LB/MEDQ  D:  03/22/2005  T:  03/22/2005  Job:  [id number]

## 2010-06-18 NOTE — H&P (Signed)
NAME:  [name], [name]                      ACCOUNT NO.:  [id number]   MEDICAL RECORD NO.:  [id number]                   PATIENT TYPE:  INP   LOCATION:  5524                                 FACILITY:  [facility]   PHYSICIAN:  Genene Churn. Cyndie Chime, M.D.          DATE OF BIRTH:  02/10/1945   DATE OF ADMISSION:  05/08/2002  DATE OF DISCHARGE:                                HISTORY & PHYSICAL   CHIEF COMPLAINT:  Unexplained abdominal pain for three months, and now  presyncopal episode.   HISTORY OF PRESENT ILLNESS:  A 65 year old woman who was in overall good  health, except for mild hypertension, on hydrochlorothiazide.  She developed  atypical diffuse abdominal pain over the last three months.  She has had an  extensive evaluation as an outpatient, with the CT scans and the MRI scans  of the abdomen done in various places around town; these have been reported  to her as negative.  Blood work was done on multiple occasions when she went  to urgent care, and she also goes to [facility] at [facility]. The  only thing that she remembers was that she had a very high ESR.  Someone  told her that they would not be surprised if she had fibromyalgia.  She has  been getting polyarthralgias and polymyalgias over the same interval.  She  got worse over the last three days.  On Saturday she had increasing  abdominal pain, vomiting and diarrhea without any fever.   Today she had a presyncopal episode and reported to [facility].  Lab work was done and she was found to have a platelet count of 8,000 and  sent immediately to the [facility], where I was called  to see her.  I stopped in the laboratory first, and waited for her CBC to  come off the machine.  This confirms the marked thrombocytopenia with  platelet count of 7,000; hemoglobin is 10 with an MCV of 84.  Total white  count 9,700 with 73% neutrophils and 21% lymphocytes.  I reviewed the blood  film,  which shows the presence of schistocytes and microspherocytes.  There  is polychromasia; a reticulocyte count is pending.  I ordered additional  laboratory tests, which show a marked elevation of serum LDH at 1,041 with  concomitant elevation of SGOT at 55 and total bilirubin at 2.6; with the  remainder of the liver functions being normal -- all supportive of a  hemolytic process.  Coagulation studies are normal, with a prothrombin time  of 12.7, PTT 36, direct Coombs test is negative.   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   She denies any neurologic symptoms, except for today when she noted some  tingling of her hands and feet.  She has not had any severe headaches.  She  gives a bizarre history of being admitted to [facility]  in December  1998 for pneumonia.  During that admission she was told that her platelet  count fell dramatically, and this was attributed to Levaquin antibiotic  (which she now lists as an allergy).   PAST MEDICAL HISTORY:  1. Multiple surgical procedures on her back after a fall and a crush injury.     Surgery done by Dr. Gerlene Fee.  2. Previous cholecystectomy by Dr. Zachery Dakins.  3. Right cataract surgery.  4. Removal of benign tumors from her neck bilaterally in the past.  5. Hysterectomy without oophorectomy at [facility] many     years ago.   MEDICATIONS:  Hydrochlorothiazide.   ALLERGIES:  LEVAQUIN (presumably).   FAMILY HISTORY:  She had nine siblings; one brother died from drowning at  age 56, 35 other siblings still alive and well.  Many family members have  diabetes.  No one has any blood disorders.  One of her daughters, aged 66,  has rheumatoid arthritis.  The patient herself was never cultured a collagen  vascular disorder or lupus.   SOCIAL HISTORY:  She is married.  She has three daughters and a son; one  daughter with rheumatoid arthritis (as noted).  She does not  smoke, does not  drink alcohol.  Her husband and one daughter accompany her tonight.   REVIEW OF SYSTEMS:  See history of present illness.  No ischemic cardiac  symptoms, no respiratory symptoms.  Abdominal pain has been fluctuating,  sometimes severe and mostly just some aching with diffuse pain.  This is  usually not associated with vomiting or diarrhea, except over this past  weekend.  She has not noticed any hematochezia or melena.  She has had an  intermittent nose bleed; no gum bleeding, no hematuria.  She denied any  severe headaches.  No skin rashes, except for easy bruising over the last  few weeks.   PHYSICAL EXAMINATION:  GENERAL:  Shows an obese white woman, in obvious  discomfort.  VITAL SIGNS:  Blood pressure 128/55, pulse 83 and regular, respirations 18,  temperature 99.4 orally.  SKIN:  Pale.  There is some small scattered ecchymoses, but no petechiae.  HEENT:  Pupils equal and reactive to light.  Optic discs sharp.  Vessels  normal.  No hemorrhage or exudate.  The pharynx is without erythema, exudate  or mass.  NECK:  Supple.  Carotids 2+; no bruits.  LUNGS:  Clear and resonant to percussion.  CARDIOVASCULAR:  Regular cardia rhythm, no murmur.  BREASTS:  There are no breast masses.  LYMPHATICS:  No lymphadenopathy.  ABDOMEN:  Soft and obese; mildly but diffusely tender.  No palpable mass or  organomegaly.  RECTAL:  No mass, no tenderness.  Stool is light brown and guaiac negative.  PELVIC:  Not pertinent.  EXTREMITIES:  No edema.  No calf tenderness.  NEUROLOGIC:  Mental status intact.  Cranial nerves intact.  Motor strength  5/5.  Reflexes 2+ and symmetric.  Coordination normal.  Gait is not tested.   IMPRESSION:  TTP/microangiopathic hemolytic anemia,with associated  thrombocytopenia.   PLAN:  I will transfuse fresh frozen plasma immediately.  I have been in contact with a vascular surgeon, who will place a vascular catheter.  We  will then begin  plasmapheresis with a one-volume daily exchange of fresh  frozen plasma.  Genene Churn. Cyndie Chime, M.D.    Lottie Rater  D:  05/08/2002  T:  05/08/2002  Job:  [id number]   cc:   Joycelyn Rua, M.D.  [address] [place]  [place] [address]  Fax: [phone]   [facility]  [address]  [place], [place]   [place]. Liliane Bade, M.D.  [address]  [place]  [place] [address]  Fax: [phone]

## 2010-06-18 NOTE — Op Note (Signed)
NAME:  [name], [name]            ACCOUNT NO.:  [id number]   MEDICAL RECORD NO.:  [id number]          PATIENT TYPE:  INP   LOCATION:  2017                         FACILITY:  [facility]   PHYSICIAN:  Francisca December, M.D.  DATE OF BIRTH:  Mar 29, 1945   DATE OF PROCEDURE:  06/23/2005  DATE OF DISCHARGE:                                 OPERATIVE REPORT   PROCEDURE PERFORMED:  Elective direct current cardioversion.   INDICATIONS:  Recurrent atrial fibrillation, symptomatic.   PROCEDURE:  While monitoring heart rate, blood pressure, O2 saturation and  ECG and under the direct supervision of Dr. Ivin Booty of the anesthesia  department, the patient received to 275 mg of IV Pentothal to induce deep  anesthesia.  Once this was established, the patient received a single  transthoracic dose of biphasic direct current energy synchronized 120  joules.  This resulted in prompt return of sinus rhythm.   IMPRESSION:  Successful elective cardioversion, atrial fibrillation to sinus  rhythm.  The patient's INR today is 2.3.  She is chronically anticoagulated  with Coumadin.   PLAN:  1.  Discontinue IV amiodarone; began p.o. amiodarone 400 mg p.o. q.d.  2.  Replete potassium  3.  Discontinue IV Lasix; reinitiate p.o.      Francisca December, M.D.  Electronically Signed     JHE/MEDQ  D:  06/23/2005  T:  06/24/2005  Job:  [id number]

## 2010-07-20 ENCOUNTER — Other Ambulatory Visit: Payer: Self-pay | Admitting: Oncology

## 2010-07-20 ENCOUNTER — Encounter (HOSPITAL_BASED_OUTPATIENT_CLINIC_OR_DEPARTMENT_OTHER): Payer: Medicare Other | Admitting: Oncology

## 2010-07-20 DIAGNOSIS — I10 Essential (primary) hypertension: Secondary | ICD-10-CM

## 2010-07-20 DIAGNOSIS — M311 Thrombotic microangiopathy: Secondary | ICD-10-CM

## 2010-07-20 LAB — CBC & DIFF AND RETIC
Basophils Absolute: 0 10*3/uL (ref 0.0–0.1)
EOS%: 0.8 % (ref 0.0–7.0)
Eosinophils Absolute: 0.1 10*3/uL (ref 0.0–0.5)
HCT: 40.1 % (ref 34.8–46.6)
HGB: 13.1 g/dL (ref 11.6–15.9)
Immature Retic Fract: 2.4 % (ref 0.00–10.70)
MCH: 29.3 pg (ref 25.1–34.0)
MONO#: 0.9 10*3/uL (ref 0.1–0.9)
NEUT%: 62 % (ref 38.4–76.8)
lymph#: 2.9 10*3/uL (ref 0.9–3.3)

## 2010-07-20 LAB — COMPREHENSIVE METABOLIC PANEL
ALT: 21 U/L (ref 0–35)
AST: 21 U/L (ref 0–37)
Alkaline Phosphatase: 60 U/L (ref 39–117)
Creatinine, Ser: 1.17 mg/dL — ABNORMAL HIGH (ref 0.50–1.10)
Total Bilirubin: 0.4 mg/dL (ref 0.3–1.2)

## 2010-07-20 LAB — MORPHOLOGY: PLT EST: INCREASED

## 2010-08-30 ENCOUNTER — Other Ambulatory Visit: Payer: Self-pay | Admitting: Family Medicine

## 2010-08-30 DIAGNOSIS — Z1231 Encounter for screening mammogram for malignant neoplasm of breast: Secondary | ICD-10-CM

## 2010-08-31 ENCOUNTER — Ambulatory Visit
Admission: RE | Admit: 2010-08-31 | Discharge: 2010-08-31 | Disposition: A | Discharge status: Home / Self Care | Payer: Medicare Other | Source: Ambulatory Visit | Attending: Family Medicine | Admitting: Family Medicine

## 2010-08-31 DIAGNOSIS — Z1231 Encounter for screening mammogram for malignant neoplasm of breast: Secondary | ICD-10-CM

## 2010-09-30 ENCOUNTER — Emergency Department (HOSPITAL_BASED_OUTPATIENT_CLINIC_OR_DEPARTMENT_OTHER)
Admission: EM | Admit: 2010-09-30 | Discharge: 2010-10-01 | Disposition: A | Discharge status: Home / Self Care | Payer: Medicare Other | Attending: Emergency Medicine | Admitting: Emergency Medicine

## 2010-09-30 ENCOUNTER — Encounter (HOSPITAL_BASED_OUTPATIENT_CLINIC_OR_DEPARTMENT_OTHER): Payer: Self-pay | Admitting: *Deleted

## 2010-09-30 DIAGNOSIS — J029 Acute pharyngitis, unspecified: Secondary | ICD-10-CM | POA: Insufficient documentation

## 2010-09-30 HISTORY — DX: Essential (primary) hypertension: I10

## 2010-09-30 NOTE — ED Notes (Signed)
Pt states that after she took her pills this am she began to fell like one was stuck in her throat pt denies breathing difficulty pt with tickling sensation and dry cough

## 2010-10-01 NOTE — ED Notes (Signed)
Pt decided to leave without being seen by MD and states will follow up with PMD in the am

## 2010-11-02 LAB — URINALYSIS, ROUTINE W REFLEX MICROSCOPIC
Bilirubin Urine: NEGATIVE
Glucose, UA: NEGATIVE
Hgb urine dipstick: NEGATIVE
Specific Gravity, Urine: 1.025
Urobilinogen, UA: 1

## 2010-11-02 LAB — COMPREHENSIVE METABOLIC PANEL
ALT: <6
AST: 20
Albumin: 3.9
CO2: 31
Chloride: 98
GFR calc Af Amer: >60
GFR calc non Af Amer: 50 — ABNORMAL LOW
Sodium: 139
Total Bilirubin: 0.6

## 2010-11-02 LAB — DIFFERENTIAL
Basophils Absolute: 0.2 — ABNORMAL HIGH
Eosinophils Absolute: 0.3
Eosinophils Relative: 2
Lymphs Abs: 2
Monocytes Absolute: 0.5

## 2010-11-02 LAB — CBC
Platelets: 505 — ABNORMAL HIGH
RBC: 4.56
WBC: 13.2 — ABNORMAL HIGH

## 2010-11-02 LAB — PROTIME-INR: Prothrombin Time: 25.9 — ABNORMAL HIGH

## 2011-01-18 ENCOUNTER — Other Ambulatory Visit: Payer: Medicare Other | Admitting: Lab

## 2011-05-03 ENCOUNTER — Encounter: Payer: Self-pay | Admitting: Oncology

## 2011-05-03 ENCOUNTER — Other Ambulatory Visit: Payer: Self-pay | Admitting: Oncology

## 2011-05-03 ENCOUNTER — Ambulatory Visit (HOSPITAL_BASED_OUTPATIENT_CLINIC_OR_DEPARTMENT_OTHER): Payer: Medicare Other | Admitting: Lab

## 2011-05-03 DIAGNOSIS — M3119 Other thrombotic microangiopathy: Secondary | ICD-10-CM

## 2011-05-03 DIAGNOSIS — M311 Thrombotic microangiopathy, unspecified: Secondary | ICD-10-CM

## 2011-05-03 HISTORY — DX: Other thrombotic microangiopathy: M31.19

## 2011-05-03 LAB — CBC & DIFF AND RETIC
BASO%: 0.7 % (ref 0.0–2.0)
Eosinophils Absolute: 0.2 10*3/uL (ref 0.0–0.5)
Immature Retic Fract: 7.9 % (ref 1.60–10.00)
MCHC: 32.3 g/dL (ref 31.5–36.0)
MONO#: 0.7 10*3/uL (ref 0.1–0.9)
NEUT#: 4 10*3/uL (ref 1.5–6.5)
Platelets: 514 10*3/uL — ABNORMAL HIGH (ref 145–400)
RBC: 4.4 10*6/uL (ref 3.70–5.45)
RDW: 15.9 % — ABNORMAL HIGH (ref 11.2–14.5)
Retic %: 1.24 % (ref 0.70–2.10)
Retic Ct Abs: 54.56 10*3/uL (ref 33.70–90.70)
WBC: 8.2 10*3/uL (ref 3.9–10.3)
lymph#: 3.2 10*3/uL (ref 0.9–3.3)

## 2011-05-03 LAB — MORPHOLOGY
PLT EST: INCREASED
RBC Comments: NORMAL

## 2011-05-03 LAB — CHCC SMEAR

## 2011-05-04 ENCOUNTER — Telehealth: Payer: Self-pay

## 2011-05-04 NOTE — Telephone Encounter (Signed)
Lab results given by phone per Dr Patsy Lager note. dph

## 2011-05-04 NOTE — Telephone Encounter (Signed)
Message copied by Albertha Ghee on Wed May 04, 2011  4:32 PM ------      Message from: Levert Feinstein      Created: Tue May 03, 2011  4:46 PM       Call pt lab stable c/w previous

## 2011-06-09 ENCOUNTER — Telehealth: Payer: Self-pay | Admitting: Oncology

## 2011-06-09 NOTE — Telephone Encounter (Signed)
Pt called requesting appt with MD, appt was ordered in [place], pt informed me that she will draw labs with regular MD and will fax to Dr. Cyndie Chime, appt schduled in July due to MD schdule

## 2011-06-22 ENCOUNTER — Telehealth: Payer: Self-pay | Admitting: Oncology

## 2011-06-22 NOTE — Telephone Encounter (Signed)
Added lb to 7/1 f/u appt. S/w pt re new time for 7/1 @ 11 am.

## 2011-08-01 ENCOUNTER — Telehealth: Payer: Self-pay | Admitting: Oncology

## 2011-08-01 ENCOUNTER — Other Ambulatory Visit: Payer: Medicare Other | Admitting: Lab

## 2011-08-01 ENCOUNTER — Ambulatory Visit (HOSPITAL_BASED_OUTPATIENT_CLINIC_OR_DEPARTMENT_OTHER): Payer: Medicare Other | Admitting: Oncology

## 2011-08-01 ENCOUNTER — Encounter: Payer: Self-pay | Admitting: Oncology

## 2011-08-01 VITALS — BP 171/73 | HR 60 | Temp 97.7°F | Ht 64.0 in | Wt 241.6 lb

## 2011-08-01 DIAGNOSIS — M311 Thrombotic microangiopathy, unspecified: Secondary | ICD-10-CM

## 2011-08-01 DIAGNOSIS — I10 Essential (primary) hypertension: Secondary | ICD-10-CM

## 2011-08-01 DIAGNOSIS — I4891 Unspecified atrial fibrillation: Secondary | ICD-10-CM

## 2011-08-01 DIAGNOSIS — Z9081 Acquired absence of spleen: Secondary | ICD-10-CM

## 2011-08-01 DIAGNOSIS — I480 Paroxysmal atrial fibrillation: Secondary | ICD-10-CM

## 2011-08-01 DIAGNOSIS — Z23 Encounter for immunization: Secondary | ICD-10-CM

## 2011-08-01 HISTORY — DX: Paroxysmal atrial fibrillation: I48.0

## 2011-08-01 LAB — CBC & DIFF AND RETIC
BASO%: 0.5 % (ref 0.0–2.0)
Eosinophils Absolute: 0.1 10*3/uL (ref 0.0–0.5)
LYMPH%: 35.6 % (ref 14.0–49.7)
MCHC: 33.5 g/dL (ref 31.5–36.0)
MONO#: 0.7 10*3/uL (ref 0.1–0.9)
NEUT#: 4.5 10*3/uL (ref 1.5–6.5)
Platelets: 535 10*3/uL — ABNORMAL HIGH (ref 145–400)
RBC: 4.63 10*6/uL (ref 3.70–5.45)
RDW: 15.3 % — ABNORMAL HIGH (ref 11.2–14.5)
Retic %: 0.96 % (ref 0.70–2.10)
Retic Ct Abs: 44.45 10*3/uL (ref 33.70–90.70)
WBC: 8.2 10*3/uL (ref 3.9–10.3)

## 2011-08-01 LAB — COMPREHENSIVE METABOLIC PANEL
ALT: 14 U/L (ref 0–35)
AST: 15 U/L (ref 0–37)
Chloride: 103 mEq/L (ref 96–112)
Creatinine, Ser: 1.07 mg/dL (ref 0.50–1.10)
Sodium: 140 mEq/L (ref 135–145)
Total Bilirubin: 0.6 mg/dL (ref 0.3–1.2)
Total Protein: 6.4 g/dL (ref 6.0–8.3)

## 2011-08-01 LAB — MORPHOLOGY: PLT EST: INCREASED

## 2011-08-01 MED ORDER — PNEUMOCOCCAL VAC POLYVALENT 25 MCG/0.5ML IJ INJ: 0.5000 mL | INJECTION | INTRAMUSCULAR | Status: DC

## 2011-08-01 MED ORDER — PNEUMOCOCCAL VAC POLYVALENT 25 MCG/0.5ML IJ INJ
0.5000 mL | INJECTION | INTRAMUSCULAR | Status: DC
  Filled 2011-08-01: qty 0.5

## 2011-08-01 MED ORDER — PNEUMOCOCCAL VAC POLYVALENT 25 MCG/0.5ML IJ INJ
0.5000 mL | INJECTION | Freq: Once | INTRAMUSCULAR | Status: AC
  Administered 2011-08-01: 0.5 mL via INTRAMUSCULAR
  Filled 2011-08-01: qty 0.5

## 2011-08-01 NOTE — Telephone Encounter (Signed)
Talked to pt , gave her appt for 2014 lab and MD

## 2011-08-01 NOTE — Progress Notes (Signed)
Hematology and Oncology Follow Up Visit  [name] [id number] 01-22-1946 66 y.o. 08/01/2011 6:35 PM   Principle Diagnosis: Encounter Diagnoses  Name Primary?  . TTP (thrombotic thrombocytopenic purpura) Yes  . S/P splenectomy   . Atrial fibrillation status post cardioversion   . Benign essential HTN      Interim History:  Followup visit for this 66 year old woman who presented with acute onset of neurologic symptoms with associated nonimmune microangiopathic hemolytic anemia and thrombocytopenia in April 2004 as the first sign of TTP. She had a stormy hospital course with development of refractory seizures and refractory atrial arrhythmias. She was treated aggressively with steroids, Rituxan, and plasma exchange and ultimately required a splenectomy to achieve a durable remission. She is doing well at this time. She denies any headache, change in vision, slurred speech, focal weakness. New  Her son-in-law who is only in his 30s was recently diagnosed and is undergoing treatment for head and neck cancer.    Medications: reviewed  Allergies:  Allergies  Allergen Reactions  . Levofloxacin Other (See Comments)    Dropped blood platelets extremely low  . Penicillins Rash    Pt reports anything that ends with an in    Review of Systems: Constitutional:  No constitutional symptoms. She has had an intentional 40 pound weight loss  Respiratory: No cough or dyspnea Cardiovascular:  No chest pain or palpitations Gastrointestinal: No change in bowel habit Genito-Urinary: Not questioned Musculoskeletal: No muscle or bone pain Neurologic: See above Skin: No rash or ecchymosis Remaining ROS negative.  Physical Exam: Blood pressure 171/73, pulse 60, temperature 97.7 F (36.5 C), temperature source Oral, height 5\' 4"  (1.626 m), weight 241 lb 9.6 oz (109.589 kg). Wt Readings from Last 3 Encounters:  08/01/11 241 lb 9.6 oz (109.589 kg)     General appearance: Pleasant  overweight Caucasian woman HENNT: Pharynx no erythema or exudate Lymph nodes: No adenopathy Breasts: Not examined Lungs: Clear to auscultation resonant to percussion Heart: Regular rhythm Abdomen: Soft nontender status post splenectomy no hepatomegaly or mass Extremities: No edema no calf tenderness Vascular: No cyanosis Neurologic: She is alert and oriented, pupils equal round reactive to light, optic disc sharp, motor strength 5 over 5, reflexes 1+ symmetric, coordination and gait normal Skin: No rash or ecchymosis  Lab Results: Lab Results  Component Value Date   WBC 8.2 08/01/2011   HGB 13.9 08/01/2011   HCT 41.5 08/01/2011   MCV 89.6 08/01/2011   PLT 535* 08/01/2011     Chemistry      Component Value Date/Time   NA 140 08/01/2011 1114   K 4.1 08/01/2011 1114   CL 103 08/01/2011 1114   CO2 28 08/01/2011 1114   BUN 15 08/01/2011 1114   CREATININE 1.07 08/01/2011 1114      Component Value Date/Time   CALCIUM 9.0 08/01/2011 1114   ALKPHOS 52 08/01/2011 1114   AST 15 08/01/2011 1114   ALT 14 08/01/2011 1114   BILITOT 0.6 08/01/2011 1114       Radiological Studies: No results found.  Impression and Plan: #1. TTP in remission now out over 9 years. I Am checking a CBC, retic count every 6 months. Clinical exam annually.  #2. History of atrial fibrillation with history of cardioversion which was unsuccessful. Rate currently controlled medically.  #3. Essential hypertension.  CC:. Dr Jolyn Nap Inis Sizer, MD 7/1/20136:35 PM

## 2012-05-31 ENCOUNTER — Encounter (HOSPITAL_BASED_OUTPATIENT_CLINIC_OR_DEPARTMENT_OTHER): Payer: Self-pay

## 2012-05-31 ENCOUNTER — Emergency Department (HOSPITAL_BASED_OUTPATIENT_CLINIC_OR_DEPARTMENT_OTHER)
Admission: EM | Admit: 2012-05-31 | Discharge: 2012-05-31 | Disposition: A | Discharge status: Home / Self Care | Payer: Medicare Other | Attending: Emergency Medicine | Admitting: Emergency Medicine

## 2012-05-31 DIAGNOSIS — Z8679 Personal history of other diseases of the circulatory system: Secondary | ICD-10-CM | POA: Insufficient documentation

## 2012-05-31 DIAGNOSIS — Z7901 Long term (current) use of anticoagulants: Secondary | ICD-10-CM | POA: Insufficient documentation

## 2012-05-31 DIAGNOSIS — Z880 Allergy status to penicillin: Secondary | ICD-10-CM | POA: Insufficient documentation

## 2012-05-31 DIAGNOSIS — Z9889 Other specified postprocedural states: Secondary | ICD-10-CM | POA: Insufficient documentation

## 2012-05-31 DIAGNOSIS — Z79899 Other long term (current) drug therapy: Secondary | ICD-10-CM | POA: Insufficient documentation

## 2012-05-31 DIAGNOSIS — M542 Cervicalgia: Secondary | ICD-10-CM | POA: Insufficient documentation

## 2012-05-31 DIAGNOSIS — I10 Essential (primary) hypertension: Secondary | ICD-10-CM | POA: Insufficient documentation

## 2012-05-31 DIAGNOSIS — Z862 Personal history of diseases of the blood and blood-forming organs and certain disorders involving the immune mechanism: Secondary | ICD-10-CM | POA: Insufficient documentation

## 2012-05-31 MED ORDER — FENTANYL CITRATE 0.05 MG/ML IJ SOLN
100.0000 ug | Freq: Once | INTRAMUSCULAR | Status: AC
  Administered 2012-05-31: 100 ug via INTRAVENOUS
  Filled 2012-05-31: qty 2

## 2012-05-31 MED ORDER — OXYCODONE-ACETAMINOPHEN 7.5-325 MG PO TABS: 1.0000 | ORAL_TABLET | ORAL | Status: DC | PRN

## 2012-05-31 MED ORDER — KETOROLAC TROMETHAMINE 30 MG/ML IJ SOLN: 30.0000 mg | Freq: Once | INTRAMUSCULAR | Status: DC

## 2012-05-31 MED ORDER — CYCLOBENZAPRINE HCL 10 MG PO TABS: 10.0000 mg | ORAL_TABLET | Freq: Three times a day (TID) | ORAL | Status: DC | PRN

## 2012-05-31 MED ORDER — FENTANYL CITRATE 0.05 MG/ML IJ SOLN
50.0000 ug | Freq: Once | INTRAMUSCULAR | Status: AC
  Administered 2012-05-31: 50 ug via INTRAVENOUS
  Filled 2012-05-31: qty 2

## 2012-05-31 MED ORDER — HYDROMORPHONE HCL PF 1 MG/ML IJ SOLN
1.0000 mg | Freq: Once | INTRAMUSCULAR | Status: DC
  Filled 2012-05-31: qty 1

## 2012-05-31 NOTE — ED Notes (Signed)
Pt states that she was sent to the ER by chiropractor, states that she has severe neck pain from one ear to the other and from top of head and down into R arm.

## 2012-05-31 NOTE — ED Notes (Signed)
Pt. Reports she gets out of her head with medicine.  Offered Pt. Bed side commode.  Pt. Talking about other things besides using BSC

## 2012-05-31 NOTE — ED Provider Notes (Signed)
History     CSN: [id number]  Arrival date & time 05/31/12  7829   First MD Initiated Contact with Patient 05/31/12 1902      Chief Complaint  Patient presents with  . Neck Pain     HPI Pt states that she was sent to the ER by chiropractor, states that she has severe neck pain from one ear to the other and from top of head and down into R arm.  Patient has no history recent trauma.  Denies fever chills or headache.  Has had history of neck problems in the past.  Woke up this morning with what she thought was a "crick"  Past Medical History  Diagnosis Date  . Thrombocytopenia   . Hypertension   . 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 Atrial fibrillation status post cardioversion 08/01/2011  . Benign essential HTN 08/01/2011    Past Surgical History  Procedure Laterality Date  . Abdominal hysterectomy    . Appendectomy    . Abdominal surgery    . Splenectomy, total    . Tonsillectomy    . Back surgery      History reviewed. No pertinent family history.  History  Substance Use Topics  . Smoking status: Never Smoker   . Smokeless tobacco: Not on file  . Alcohol Use: No    OB History   Grav Para Term Preterm Abortions TAB SAB Ect Mult Living                  Review of Systems  All other systems reviewed and are negative.    Allergies  Aspirin; Levofloxacin; and Penicillins  Home Medications   Current Outpatient Rx  Name  Route  Sig  Dispense  Refill  . amiodarone (PACERONE) 200 MG tablet   Oral   Take 200 mg by mouth daily.           . benazepril (LOTENSIN) 5 MG tablet   Oral   Take 5 mg by mouth daily.           . furosemide (LASIX) 80 MG tablet   Oral   Take 80 mg by mouth 2 (two) times daily.           Marland Kitchen levothyroxine (SYNTHROID, LEVOTHROID) 75 MCG tablet   Oral   Take 50 mcg by mouth daily.          . potassium chloride (KLOR-CON) 20 MEQ packet   Oral   Take 20 mEq by mouth 4 (four) times a week.          .  traZODone (DESYREL) 100 MG tablet   Oral   Take 100 mg by mouth at bedtime.           Marland Kitchen warfarin (COUMADIN) 5 MG tablet   Oral   Take 5 mg by mouth daily.           . cyclobenzaprine (FLEXERIL) 10 MG tablet   Oral   Take 1 tablet (10 mg total) by mouth 3 (three) times daily as needed for muscle spasms.   30 tablet   0   . oxyCODONE-acetaminophen (PERCOCET) 7.5-325 MG per tablet   Oral   Take 1 tablet by mouth every 4 (four) hours as needed for pain.   30 tablet   0     BP 177/86  Pulse 64  Temp(Src) 98 F (36.7 C) (Oral)  Resp 20  Ht 5\' 4"  (1.626 m)  Wt 250 lb (113.399 kg)  BMI 42.89 kg/m2  SpO2 98%  Physical Exam  Nursing note and vitals reviewed. Constitutional: She is oriented to person, place, and time. She appears well-developed and well-nourished. No distress.  HENT:  Head: Normocephalic and atraumatic.  Eyes: Pupils are equal, round, and reactive to light.  Neck:    Cardiovascular: Normal rate and intact distal pulses.   Pulmonary/Chest: No respiratory distress.  Abdominal: Normal appearance. She exhibits no distension.  Musculoskeletal: Normal range of motion.  Neurological: She is alert and oriented to person, place, and time. No cranial nerve deficit.  Skin: Skin is warm and dry. No rash noted.  Psychiatric: She has a normal mood and affect. Her behavior is normal.    ED Course  Procedures (including critical care time) Meds ordered this encounter  Medications  . fentaNYL (SUBLIMAZE) injection 100 mcg    Sig:   . fentaNYL (SUBLIMAZE) injection 50 mcg    Sig:   . oxyCODONE-acetaminophen (PERCOCET) 7.5-325 MG per tablet    Sig: Take 1 tablet by mouth every 4 (four) hours as needed for pain.    Dispense:  30 tablet    Refill:  0  . cyclobenzaprine (FLEXERIL) 10 MG tablet    Sig: Take 1 tablet (10 mg total) by mouth 3 (three) times daily as needed for muscle spasms.    Dispense:  30 tablet    Refill:  0    Labs Reviewed - No data to  display No results found.   1. Cervical pain       MDM  After treatment in the ED the patient feels back to baseline and wants to go home.      Nelia Shi, MD 05/31/12 2043

## 2012-06-02 ENCOUNTER — Ambulatory Visit (HOSPITAL_BASED_OUTPATIENT_CLINIC_OR_DEPARTMENT_OTHER): Admit: 2012-06-02 | Payer: Medicare Other

## 2012-07-24 ENCOUNTER — Telehealth: Payer: Self-pay | Admitting: Oncology

## 2012-07-24 NOTE — Telephone Encounter (Signed)
Pt called and gave her MD and lab visit on 08/06/12

## 2012-07-27 ENCOUNTER — Encounter (HOSPITAL_BASED_OUTPATIENT_CLINIC_OR_DEPARTMENT_OTHER): Payer: Self-pay | Admitting: *Deleted

## 2012-07-27 ENCOUNTER — Emergency Department (HOSPITAL_BASED_OUTPATIENT_CLINIC_OR_DEPARTMENT_OTHER)
Admission: EM | Admit: 2012-07-27 | Discharge: 2012-07-27 | Disposition: A | Discharge status: Home / Self Care | Payer: Medicare Other | Attending: Emergency Medicine | Admitting: Emergency Medicine

## 2012-07-27 DIAGNOSIS — R6889 Other general symptoms and signs: Secondary | ICD-10-CM | POA: Insufficient documentation

## 2012-07-27 DIAGNOSIS — R791 Abnormal coagulation profile: Secondary | ICD-10-CM

## 2012-07-27 DIAGNOSIS — I10 Essential (primary) hypertension: Secondary | ICD-10-CM | POA: Insufficient documentation

## 2012-07-27 DIAGNOSIS — Z79899 Other long term (current) drug therapy: Secondary | ICD-10-CM | POA: Insufficient documentation

## 2012-07-27 DIAGNOSIS — Z7901 Long term (current) use of anticoagulants: Secondary | ICD-10-CM | POA: Insufficient documentation

## 2012-07-27 DIAGNOSIS — Z880 Allergy status to penicillin: Secondary | ICD-10-CM | POA: Insufficient documentation

## 2012-07-27 DIAGNOSIS — Z862 Personal history of diseases of the blood and blood-forming organs and certain disorders involving the immune mechanism: Secondary | ICD-10-CM | POA: Insufficient documentation

## 2012-07-27 DIAGNOSIS — Z8679 Personal history of other diseases of the circulatory system: Secondary | ICD-10-CM | POA: Insufficient documentation

## 2012-07-27 LAB — CBC WITH DIFFERENTIAL/PLATELET
Basophils Relative: 0 % (ref 0–1)
Hemoglobin: 13.9 g/dL (ref 12.0–15.0)
Lymphocytes Relative: 38 % (ref 12–46)
Lymphs Abs: 3.4 10*3/uL (ref 0.7–4.0)
MCHC: 33.6 g/dL (ref 30.0–36.0)
Monocytes Relative: 8 % (ref 3–12)
Neutro Abs: 4.7 10*3/uL (ref 1.7–7.7)
Neutrophils Relative %: 52 % (ref 43–77)
RBC: 4.6 MIL/uL (ref 3.87–5.11)
WBC: 9 10*3/uL (ref 4.0–10.5)

## 2012-07-27 LAB — PROTIME-INR
INR: 3.78 — ABNORMAL HIGH (ref 0.00–1.49)
Prothrombin Time: 35.9 seconds — ABNORMAL HIGH (ref 11.6–15.2)

## 2012-07-27 LAB — BASIC METABOLIC PANEL
GFR calc Af Amer: 48 mL/min — ABNORMAL LOW (ref >90)
GFR calc non Af Amer: 42 mL/min — ABNORMAL LOW (ref >90)
Potassium: 3.8 mEq/L (ref 3.5–5.1)
Sodium: 142 mEq/L (ref 135–145)

## 2012-07-27 NOTE — ED Provider Notes (Signed)
History    CSN: [id number] Arrival date & time 07/27/12  1536  First MD Initiated Contact with Patient 07/27/12 1600     Chief Complaint  Patient presents with  . Girard Cooter labs    (Consider location/radiation/quality/duration/timing/severity/associated sxs/prior Treatment) HPI Comments: Pt states that she was sent over here by her pmd after she had labs drawn this morning and her inr 5.4:pt states that she noticed blood in her right VHQ:IONGEX blood in urine, no coughing up blood and denies headache:pt denies any other symptoms:pt is on the coumadin for a fib  The history is provided by the patient. No language interpreter was used.   Past Medical History  Diagnosis Date  . Thrombocytopenia   . Hypertension   . 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 Atrial fibrillation status post cardioversion 08/01/2011  . Benign essential HTN 08/01/2011   Past Surgical History  Procedure Laterality Date  . Abdominal hysterectomy    . Appendectomy    . Abdominal surgery    . Splenectomy, total    . Tonsillectomy    . Back surgery     History reviewed. No pertinent family history. History  Substance Use Topics  . Smoking status: Never Smoker   . Smokeless tobacco: Not on file  . Alcohol Use: No   OB History   Grav Para Term Preterm Abortions TAB SAB Ect Mult Living                 Review of Systems  Constitutional: Negative.   Respiratory: Negative.   Cardiovascular: Negative.     Allergies  Aspirin; Levofloxacin; and Penicillins  Home Medications   Current Outpatient Rx  Name  Route  Sig  Dispense  Refill  . amiodarone (PACERONE) 200 MG tablet   Oral   Take 200 mg by mouth daily.           . benazepril (LOTENSIN) 5 MG tablet   Oral   Take 5 mg by mouth daily.           . cyclobenzaprine (FLEXERIL) 10 MG tablet   Oral   Take 1 tablet (10 mg total) by mouth 3 (three) times daily as needed for muscle spasms.   30 tablet   0   . furosemide (LASIX)  80 MG tablet   Oral   Take 80 mg by mouth 2 (two) times daily.           Marland Kitchen levothyroxine (SYNTHROID, LEVOTHROID) 75 MCG tablet   Oral   Take 50 mcg by mouth daily.          Marland Kitchen oxyCODONE-acetaminophen (PERCOCET) 7.5-325 MG per tablet   Oral   Take 1 tablet by mouth every 4 (four) hours as needed for pain.   30 tablet   0   . potassium chloride (KLOR-CON) 20 MEQ packet   Oral   Take 20 mEq by mouth 4 (four) times a week.          . traZODone (DESYREL) 100 MG tablet   Oral   Take 100 mg by mouth at bedtime.           Marland Kitchen warfarin (COUMADIN) 5 MG tablet   Oral   Take 5 mg by mouth daily.            BP 165/100  Pulse 78  Temp(Src) 98.2 F (36.8 C) (Oral)  Resp 16  Ht 5\' 4"  (1.626 m)  Wt 255 lb (115.667 kg)  BMI 43.75 kg/m2  SpO2 100% Physical Exam  Nursing note and vitals reviewed. Constitutional: She is oriented to person, place, and time. She appears well-developed and well-nourished.  HENT:  Head: Normocephalic and atraumatic.  Eyes: EOM are normal. Pupils are equal, round, and reactive to light.  subconjunctival hemmorhage  Neck: Normal range of motion. Neck supple.  Cardiovascular: Normal rate and regular rhythm.   Pulmonary/Chest: Effort normal and breath sounds normal.  Musculoskeletal: Normal range of motion.  Neurological: She is alert and oriented to person, place, and time.  Skin: Skin is warm and dry.    ED Course  Procedures (including critical care time) Labs Reviewed  PROTIME-INR - Abnormal; Notable for the following:    Prothrombin Time 35.9 (*)    INR 3.78 (*)    All other components within normal limits  BASIC METABOLIC PANEL - Abnormal; Notable for the following:    Glucose, Bld 110 (*)    Creatinine, Ser 1.30 (*)    GFR calc non Af Amer 42 (*)    GFR calc Af Amer 48 (*)    All other components within normal limits  CBC WITH DIFFERENTIAL - Abnormal; Notable for the following:    RDW 16.8 (*)    Platelets 559 (*)    All other  components within normal limits   No results found. 1. Abnormal INR     MDM  inr is abnormal:pt is okay to hold a dose and follow up with pcp on Monday:discussed need for recheck if fall or if she is spontaneously bleeding:pt has had her dose altered 2 weeks ago  Teressa Lower, NP 07/27/12 1735

## 2012-07-27 NOTE — ED Notes (Signed)
Sent here from PMD office for INR 5.4 and PT 60.1 pt takes coumadin for a-fib

## 2012-07-28 NOTE — ED Provider Notes (Signed)
History/physical exam/procedure(s) were performed by non-physician practitioner and as supervising physician I was immediately available for consultation/collaboration. I have reviewed all notes and am in agreement with care and plan.   Mathias Bogacki S Bailee Metter, MD 07/28/12 1237 

## 2012-08-06 ENCOUNTER — Other Ambulatory Visit (HOSPITAL_BASED_OUTPATIENT_CLINIC_OR_DEPARTMENT_OTHER): Payer: Medicare Other

## 2012-08-06 ENCOUNTER — Ambulatory Visit (HOSPITAL_BASED_OUTPATIENT_CLINIC_OR_DEPARTMENT_OTHER): Payer: Medicare Other | Admitting: Oncology

## 2012-08-06 VITALS — BP 170/85 | HR 66 | Temp 97.6°F | Resp 17 | Ht 64.0 in | Wt 255.6 lb

## 2012-08-06 DIAGNOSIS — M311 Thrombotic microangiopathy: Secondary | ICD-10-CM

## 2012-08-06 DIAGNOSIS — Z9089 Acquired absence of other organs: Secondary | ICD-10-CM

## 2012-08-06 DIAGNOSIS — Z9081 Acquired absence of spleen: Secondary | ICD-10-CM

## 2012-08-06 LAB — CBC & DIFF AND RETIC
Basophils Absolute: 0.1 10*3/uL (ref 0.0–0.1)
Eosinophils Absolute: 0.1 10*3/uL (ref 0.0–0.5)
HGB: 13.7 g/dL (ref 11.6–15.9)
Immature Retic Fract: 2.9 % (ref 1.60–10.00)
MCV: 90.9 fL (ref 79.5–101.0)
MONO%: 7 % (ref 0.0–14.0)
NEUT#: 3.6 10*3/uL (ref 1.5–6.5)
RDW: 15.9 % — ABNORMAL HIGH (ref 11.2–14.5)
Retic Ct Abs: 67.6 10*3/uL (ref 33.70–90.70)

## 2012-08-06 LAB — COMPREHENSIVE METABOLIC PANEL (CC13)
AST: 17 U/L (ref 5–34)
Alkaline Phosphatase: 68 U/L (ref 40–150)
BUN: 8.8 mg/dL (ref 7.0–26.0)
Creatinine: 0.9 mg/dL (ref 0.6–1.1)

## 2012-08-06 LAB — MORPHOLOGY

## 2012-08-06 NOTE — Progress Notes (Signed)
Hematology and Oncology Follow Up Visit  BRANTLEY NASER [id number] 11/14/1945 67 y.o. 08/06/2012 2:12 PM   Principle Diagnosis: Encounter Diagnosis  Name Primary?  . TTP (thrombotic thrombocytopenic purpura) Yes     Interim History:   Mrs. Henckel has now reached 10 years from the initial diagnosis of TTP. She was diagnosed in May of 2004. She presented with a three-month history of atypical abdominal pain and then developed acute neurologic symptoms as  signs of her blood disorder. She was particularly treatment refractory to plasma exchange, steroids, and Rituxan. She developed active seizures requiring intubation as well as atrial arrhythmias during the initial part of her treatment with plasma exchange and steroids. She required a splenectomy. She ultimately achieved a complete and durable response.  She's had no interim medical problems. She remains on Coumadin prophylaxis due to chronic atrial fibrillation. She is hypothyroid on replacement.  She denies any headache, change in vision, slurred speech, focal weakness.  Medications: reviewed  Allergies:  Allergies  Allergen Reactions  . Aspirin   . Levofloxacin Other (See Comments)    Dropped blood platelets extremely low  . Penicillins Rash    Pt reports anything that ends with an in    Review of Systems: Constitutional:  No constitutional symptoms  Respiratory: No cough or dyspnea Cardiovascular:  No chest pain or palpitations Gastrointestinal: No change in bowel habit Genito-Urinary: Not questioned Musculoskeletal: No muscle bone or joint pain Neurologic: See above Skin: No rash or ecchymosis Remaining ROS negative.  Physical Exam: Blood pressure 170/85, pulse 66, temperature 97.6 F (36.4 C), temperature source Oral, resp. rate 17, height 5\' 4"  (1.626 m), weight 255 lb 9.6 oz (115.939 kg). Wt Readings from Last 3 Encounters:  08/06/12 255 lb 9.6 oz (115.939 kg)  07/27/12 255 lb (115.667 kg)  05/31/12 250 lb  (113.399 kg)     General appearance: Well-nourished Caucasian woman HENNT: Pharynx no erythema or exudate Lymph nodes: No adenopathy Breasts: Lungs: Clear to auscultation resonant to percussion Heart: Regular rhythm no murmur Abdomen: Soft, nontender, no mass, no organomegaly Extremities: No edema, no calf tenderness Musculoskeletal: No joint deformities GU: Vascular: No cyanosis Neurologic: Mental status intact, PERRLA, motor strength 5 over 5, reflexes absent symmetric at the knees, 1+ symmetric at the biceps Skin: No rash or ecchymosis  Lab Results: Lab Results  Component Value Date   WBC 7.9 08/06/2012   HGB 13.7 08/06/2012   HCT 42.1 08/06/2012   MCV 90.9 08/06/2012   PLT 534* 08/06/2012     Chemistry      Component Value Date/Time   NA 142 08/06/2012 1130   NA 142 07/27/2012 1648   K 4.0 08/06/2012 1130   K 3.8 07/27/2012 1648   CL 103 07/27/2012 1648   CO2 29 08/06/2012 1130   CO2 30 07/27/2012 1648   BUN 8.8 08/06/2012 1130   BUN 16 07/27/2012 1648   CREATININE 0.9 08/06/2012 1130   CREATININE 1.30* 07/27/2012 1648      Component Value Date/Time   CALCIUM 8.9 08/06/2012 1130   CALCIUM 8.9 07/27/2012 1648   ALKPHOS 68 08/06/2012 1130   ALKPHOS 52 08/01/2011 1114   AST 17 08/06/2012 1130   AST 15 08/01/2011 1114   ALT 14 08/06/2012 1130   ALT 14 08/01/2011 1114   BILITOT 0.65 08/06/2012 1130   BILITOT 0.6 08/01/2011 1114       Radiological Studies: No results found.  Impression: TTP in ongoing remission now out 10 years. I told her  she can graduate from our practice at this time. Be happy to see her again in the future if the need arises. Her family doctor can check a blood count once a year.    CC:. Dr Nathen May Nigel Mormon, MD 7/7/20142:12 PM

## 2012-12-22 ENCOUNTER — Encounter: Payer: Self-pay | Admitting: Cardiology

## 2012-12-31 ENCOUNTER — Ambulatory Visit: Payer: Medicare Other | Admitting: Cardiology

## 2013-01-07 ENCOUNTER — Ambulatory Visit (INDEPENDENT_AMBULATORY_CARE_PROVIDER_SITE_OTHER): Payer: Medicare Other | Admitting: Cardiology

## 2013-01-07 ENCOUNTER — Encounter: Payer: Self-pay | Admitting: Cardiology

## 2013-01-07 VITALS — BP 162/83 | HR 65 | Ht 65.0 in | Wt 258.0 lb

## 2013-01-07 DIAGNOSIS — I4891 Unspecified atrial fibrillation: Secondary | ICD-10-CM

## 2013-01-07 DIAGNOSIS — I10 Essential (primary) hypertension: Secondary | ICD-10-CM

## 2013-01-07 DIAGNOSIS — Z79899 Other long term (current) drug therapy: Secondary | ICD-10-CM

## 2013-01-07 HISTORY — DX: Morbid (severe) obesity due to excess calories: E66.01

## 2013-01-07 NOTE — Patient Instructions (Signed)
Your physician recommends that you continue on your current medications as directed. Please refer to the Current Medication list given to you today.  Your physician recommends that you follow-up as needed.  

## 2013-01-07 NOTE — Progress Notes (Signed)
[address] [place], [place]  [address] Phone: [phone] Fax:  [phone]  Date:  01/07/2013   ID:  [name], DOB Oct 07, 1945, MRN [id number]  PCP:  Angelica Chessman., MD   History of Present Illness: [name] is a 67 y.o. female with paroxysmal atrial fibrillation, normal EF, prior diastolic dysfunction as presentation of atrial fibrillation, diabetes, obesity here for evaluation.  She's had prior episodes of atrial fibrillation that are brief, lasting approximately one hour that are relieved with rest with . Felt SOB during. Mild dizzy. Nausea.   Prior episode did not realize she was in atrial fibrillation. It was detected when edema was worsened.  October 2012 echocardiogram showed normal EF, borderline left atrial enlargement, trivial valvular lesions. Overall reassuring.    She is doing very well. She has been taking her medications I believe. She has been on amiodarone for some time. This seems to be maintaining her normal rhythm very well. Also, her primary physician has been monitoring her warfarin level.  Wt Readings from Last 3 Encounters:  01/07/13 258 lb (117.028 kg)  08/06/12 255 lb 9.6 oz (115.939 kg)  07/27/12 255 lb (115.667 kg)     Past Medical History  Diagnosis Date  . Thrombocytopenia   . Hypertension   . 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 Atrial fibrillation status post cardioversion 08/01/2011  . Benign essential HTN 08/01/2011    Past Surgical History  Procedure Laterality Date  . Abdominal hysterectomy    . Appendectomy    . Abdominal surgery    . Splenectomy, total    . Tonsillectomy    . Back surgery      Current Outpatient Prescriptions  Medication Sig Dispense Refill  . amiodarone (PACERONE) 200 MG tablet Take 200 mg by mouth daily.        . benazepril (LOTENSIN) 5 MG tablet Take 5 mg by mouth daily.       . furosemide (LASIX) 80 MG tablet Take 40 mg by mouth daily.       Marland Kitchen  levothyroxine (SYNTHROID, LEVOTHROID) 50 MCG tablet Take 25 mcg by mouth daily before breakfast.      . oxyCODONE-acetaminophen (PERCOCET) 7.5-325 MG per tablet Take 1 tablet by mouth every 4 (four) hours as needed for pain.  30 tablet  0  . potassium chloride (KLOR-CON) 20 MEQ packet Take 20 mEq by mouth 4 (four) times a week.       . traZODone (DESYREL) 100 MG tablet Take 100 mg by mouth at bedtime.        Marland Kitchen warfarin (COUMADIN) 5 MG tablet Take 5 mg by mouth daily.         No current facility-administered medications for this visit.    Allergies:    Allergies  Allergen Reactions  . Aspirin   . Levofloxacin Other (See Comments)    Dropped blood platelets extremely low  . Penicillins Rash    Pt reports anything that ends with an in    Social History:  The patient  reports that she has never smoked. She does not have any smokeless tobacco history on file. She reports that she does not drink alcohol or use illicit drugs.   ROS:  Please see the history of present illness.   Occasional back pain. No palpitations.  PHYSICAL EXAM: VS:  BP 162/83  Pulse 65  Ht 5\' 5"  (1.651 m)  Wt 258 lb (117.028 kg)  BMI 42.93  kg/m2 Well nourished, well developed, in no acute distress HEENT: normal Neck: no JVD Cardiac:  normal S1, S2; RRR; no murmur Lungs:  clear to auscultation bilaterally, no wheezing, rhonchi or rales Abd: soft, nontender, no hepatomegaly Ext: no edema Skin: warm and dry Neuro: no focal abnormalities noted  EKG:  Patient refused today.  ASSESSMENT AND PLAN:  1. Paroxysmal atrial fibrillation - continuing with amiodarone maintenance dose. She has had issues in the past with paroxysmal atrial fibrillation requiring cardioversion. She would rather see me on as-needed basis. I'm fine with this I clearly explained to her that her primary physician Dr. Cephus Richer would need to monitor TSH as well as LFTs while on amiodarone. Also, I recommended periodic EKG to ensure that there no  electrical abnormalities or conduction abnormalities while on amiodarone. She did not wish to have one today. 2. Morbid obesity-encourage weight loss. She has lost weight in the past. 3. Hypertension-mildly elevated today. Continue to monitor. 4. I will see her an as-needed basis at her request. I did explain to her clearly that I would not be able to refill her amiodarone if she did not have regular maintenance visits. She understands this and she states that Dr. Cephus Richer has been refilling this medication for her. I'm fine with this.  Signed, Donato Schultz, MD [facility]  01/07/2013 11:39 AM

## 2013-03-02 ENCOUNTER — Emergency Department (HOSPITAL_BASED_OUTPATIENT_CLINIC_OR_DEPARTMENT_OTHER)
Admission: EM | Admit: 2013-03-02 | Discharge: 2013-03-02 | Disposition: A | Discharge status: Home / Self Care | Payer: Medicare Other | Attending: Emergency Medicine | Admitting: Emergency Medicine

## 2013-03-02 ENCOUNTER — Encounter (HOSPITAL_BASED_OUTPATIENT_CLINIC_OR_DEPARTMENT_OTHER): Payer: Self-pay | Admitting: Emergency Medicine

## 2013-03-02 DIAGNOSIS — Z9889 Other specified postprocedural states: Secondary | ICD-10-CM | POA: Insufficient documentation

## 2013-03-02 DIAGNOSIS — Z7901 Long term (current) use of anticoagulants: Secondary | ICD-10-CM | POA: Insufficient documentation

## 2013-03-02 DIAGNOSIS — M5430 Sciatica, unspecified side: Secondary | ICD-10-CM | POA: Insufficient documentation

## 2013-03-02 DIAGNOSIS — Z79899 Other long term (current) drug therapy: Secondary | ICD-10-CM | POA: Insufficient documentation

## 2013-03-02 DIAGNOSIS — I10 Essential (primary) hypertension: Secondary | ICD-10-CM | POA: Insufficient documentation

## 2013-03-02 DIAGNOSIS — Z880 Allergy status to penicillin: Secondary | ICD-10-CM | POA: Insufficient documentation

## 2013-03-02 DIAGNOSIS — I4891 Unspecified atrial fibrillation: Secondary | ICD-10-CM | POA: Insufficient documentation

## 2013-03-02 DIAGNOSIS — Z862 Personal history of diseases of the blood and blood-forming organs and certain disorders involving the immune mechanism: Secondary | ICD-10-CM | POA: Insufficient documentation

## 2013-03-02 MED ORDER — PREDNISONE 20 MG PO TABS: 60.0000 mg | ORAL_TABLET | Freq: Every day | ORAL | Status: DC

## 2013-03-02 MED ORDER — HYDROCODONE-ACETAMINOPHEN 5-325 MG PO TABS
2.0000 | ORAL_TABLET | Freq: Four times a day (QID) | ORAL | Status: DC | PRN
Start: 2013-03-02 — End: 2015-01-13

## 2013-03-02 MED ORDER — PREDNISONE 50 MG PO TABS
60.0000 mg | ORAL_TABLET | Freq: Once | ORAL | Status: AC
  Administered 2013-03-02: 60 mg via ORAL
  Filled 2013-03-02 (×2): qty 1

## 2013-03-02 NOTE — ED Provider Notes (Signed)
CSN: [id number]     Arrival date & time 03/02/13  1726 History  This chart was scribed for Charles B. Karle Starch, MD by Roe Coombs, ED Scribe. The patient was seen in room MH11/MH11. Patient's care was started at 6:38 PM.    Chief Complaint  Patient presents with  . Back Pain    The history is provided by the patient. No language interpreter was used.    HPI Comments: [name] is a 68 y.o. female with a history of back surgery who presents to the Emergency Department complaining of worsening, constant right lower back pain that radiates down her right leg onset 6 days ago. Pain is worse with ambulation. She states that she has been helping to take care of her mother-in-law and this may have contributed to her current episode of back pain. She has seen a chiropractor her pain was not improved after this visit. She is also reporting 2 episodes of urinary incontinence over the last week, but this is intermittent and does not seem to be correlated to her pain. She denies any other symptoms at this time. Patient's other medical history includes TTP, HTN, atrial fibrillation (treated with warfarin 5 mg daily).  Past Medical History  Diagnosis Date  . Thrombocytopenia   . Hypertension   . 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 Atrial fibrillation status post cardioversion 08/01/2011  . Benign essential HTN 08/01/2011  . Morbid obesity 01/07/2013   Past Surgical History  Procedure Laterality Date  . Abdominal hysterectomy    . Appendectomy    . Abdominal surgery    . Splenectomy, total    . Tonsillectomy    . Back surgery     Family History  Problem Relation Age of Onset  . Heart attack Mother   . Cancer Sister   . Cancer Brother    History  Substance Use Topics  . Smoking status: Never Smoker   . Smokeless tobacco: Not on file  . Alcohol Use: No   OB History   Grav Para Term Preterm Abortions TAB SAB Ect Mult Living                 Review of Systems A  complete 10 system review of systems was obtained and all systems are negative except as noted in the HPI and PMH.   Allergies  Aspirin; Levofloxacin; and Penicillins  Home Medications   Current Outpatient Rx  Name  Route  Sig  Dispense  Refill  . amiodarone (PACERONE) 200 MG tablet   Oral   Take 200 mg by mouth daily.           . benazepril (LOTENSIN) 5 MG tablet   Oral   Take 5 mg by mouth daily.          . furosemide (LASIX) 80 MG tablet   Oral   Take 40 mg by mouth daily.          Marland Kitchen levothyroxine (SYNTHROID, LEVOTHROID) 50 MCG tablet   Oral   Take 25 mcg by mouth daily before breakfast.         . oxyCODONE-acetaminophen (PERCOCET) 7.5-325 MG per tablet   Oral   Take 1 tablet by mouth every 4 (four) hours as needed for pain.   30 tablet   0   . potassium chloride (KLOR-CON) 20 MEQ packet   Oral   Take 20 mEq by mouth 4 (four) times a week.          Marland Kitchen  traZODone (DESYREL) 100 MG tablet   Oral   Take 100 mg by mouth at bedtime.           Marland Kitchen warfarin (COUMADIN) 5 MG tablet   Oral   Take 5 mg by mouth daily.            Triage Vitals: BP 178/71  Pulse 73  Temp(Src) 98 F (36.7 C) (Oral)  Resp 18  Ht 5\' 4"  (1.626 m)  Wt 260 lb (117.935 kg)  BMI 44.61 kg/m2  SpO2 98% Physical Exam  Nursing note and vitals reviewed. Constitutional: She is oriented to person, place, and time. She appears well-developed and well-nourished.  HENT:  Head: Normocephalic and atraumatic.  Eyes: EOM are normal. Pupils are equal, round, and reactive to light.  Neck: Normal range of motion. Neck supple.  Cardiovascular: Normal rate, normal heart sounds and intact distal pulses.   Pulmonary/Chest: Effort normal and breath sounds normal.  Abdominal: Bowel sounds are normal. She exhibits no distension. There is no tenderness.  Musculoskeletal: Normal range of motion. She exhibits tenderness. She exhibits no edema.  Tender in the right lumbar paraspinal muscles and right  sciatic notch.  Neurological: She is alert and oriented to person, place, and time. She has normal strength. No cranial nerve deficit or sensory deficit.  No weakness with plantar or dorsiflexion, able to ambulate  Skin: Skin is warm and dry. No rash noted.  Psychiatric: She has a normal mood and affect.    ED Course  Procedures (including critical care time) DIAGNOSTIC STUDIES: Oxygen Saturation is 98% on room air, normal by my interpretation.    COORDINATION OF CARE: 6:41 PM- Patient informed of current plan for treatment and evaluation and agrees with plan at this time.    Labs Review Labs Reviewed - No data to display   Imaging Review No results found.  EKG Interpretation   None       MDM   1. Sciatica     Pt with symptoms of lumbar radiculopathy. Doubt this is acute cord compression. Advised prednisone/norco, close PCP followup for further eval. Return to the ED for worsening.   I personally performed the services described in this documentation, which was scribed in my presence. The recorded information has been reviewed and is accurate.     Charles B. Karle Starch, MD 03/02/13 [phone]

## 2013-03-02 NOTE — ED Notes (Signed)
C/o low back pain, onset one week ago.  Helps take care of her mother in law. Also c/o increasing urinary incontinence.  Denies radiation down her legs.

## 2013-03-02 NOTE — Discharge Instructions (Signed)
Sciatica °Sciatica is pain, weakness, numbness, or tingling along the path of the sciatic nerve. The nerve starts in the lower back and runs down the back of each leg. The nerve controls the muscles in the lower leg and in the back of the knee, while also providing sensation to the back of the thigh, lower leg, and the sole of your foot. Sciatica is a symptom of another medical condition. For instance, nerve damage or certain conditions, such as a herniated disk or bone spur on the spine, pinch or put pressure on the sciatic nerve. This causes the pain, weakness, or other sensations normally associated with sciatica. Generally, sciatica only affects one side of the body. °CAUSES  °· Herniated or slipped disc. °· Degenerative disk disease. °· A pain disorder involving the narrow muscle in the buttocks (piriformis syndrome). °· Pelvic injury or fracture. °· Pregnancy. °· Tumor (rare). °SYMPTOMS  °Symptoms can vary from mild to very severe. The symptoms usually travel from the low back to the buttocks and down the back of the leg. Symptoms can include: °· Mild tingling or dull aches in the lower back, leg, or hip. °· Numbness in the back of the calf or sole of the foot. °· Burning sensations in the lower back, leg, or hip. °· Sharp pains in the lower back, leg, or hip. °· Leg weakness. °· Severe back pain inhibiting movement. °These symptoms may get worse with coughing, sneezing, laughing, or prolonged sitting or standing. Also, being overweight may worsen symptoms. °DIAGNOSIS  °Your caregiver will perform a physical exam to look for common symptoms of sciatica. He or she may ask you to do certain movements or activities that would trigger sciatic nerve pain. Other tests may be performed to find the cause of the sciatica. These may include: °· Blood tests. °· X-rays. °· Imaging tests, such as an MRI or CT scan. °TREATMENT  °Treatment is directed at the cause of the sciatic pain. Sometimes, treatment is not necessary  and the pain and discomfort goes away on its own. If treatment is needed, your caregiver may suggest: °· Over-the-counter medicines to relieve pain. °· Prescription medicines, such as anti-inflammatory medicine, muscle relaxants, or narcotics. °· Applying heat or ice to the painful area. °· Steroid injections to lessen pain, irritation, and inflammation around the nerve. °· Reducing activity during periods of pain. °· Exercising and stretching to strengthen your abdomen and improve flexibility of your spine. Your caregiver may suggest losing weight if the extra weight makes the back pain worse. °· Physical therapy. °· Surgery to eliminate what is pressing or pinching the nerve, such as a bone spur or part of a herniated disk. °HOME CARE INSTRUCTIONS  °· Only take over-the-counter or prescription medicines for pain or discomfort as directed by your caregiver. °· Apply ice to the affected area for 20 minutes, 3 4 times a day for the first 48 72 hours. Then try heat in the same way. °· Exercise, stretch, or perform your usual activities if these do not aggravate your pain. °· Attend physical therapy sessions as directed by your caregiver. °· Keep all follow-up appointments as directed by your caregiver. °· Do not wear high heels or shoes that do not provide proper support. °· Check your mattress to see if it is too soft. A firm mattress may lessen your pain and discomfort. °SEEK IMMEDIATE MEDICAL CARE IF:  °· You lose control of your bowel or bladder (incontinence). °· You have increasing weakness in the lower back,   pelvis, buttocks, or legs. °· You have redness or swelling of your back. °· You have a burning sensation when you urinate. °· You have pain that gets worse when you lie down or awakens you at night. °· Your pain is worse than you have experienced in the past. °· Your pain is lasting longer than 4 weeks. °· You are suddenly losing weight without reason. °MAKE SURE YOU: °· Understand these  instructions. °· Will watch your condition. °· Will get help right away if you are not doing well or get worse. °Document Released: 01/11/2001 Document Revised: 07/19/2011 Document Reviewed: 05/29/2011 °[employer]® Patient Information ©2014 [employer]. ° °

## 2013-03-30 ENCOUNTER — Encounter: Payer: Self-pay | Admitting: Oncology

## 2013-06-14 DIAGNOSIS — M545 Low back pain, unspecified: Secondary | ICD-10-CM | POA: Insufficient documentation

## 2013-06-14 DIAGNOSIS — M816 Localized osteoporosis [Lequesne]: Secondary | ICD-10-CM | POA: Insufficient documentation

## 2013-06-14 DIAGNOSIS — E039 Hypothyroidism, unspecified: Secondary | ICD-10-CM | POA: Diagnosis present

## 2014-02-05 DIAGNOSIS — Z79899 Other long term (current) drug therapy: Secondary | ICD-10-CM | POA: Insufficient documentation

## 2014-04-15 ENCOUNTER — Telehealth: Payer: Self-pay | Admitting: Cardiology

## 2014-04-15 NOTE — Telephone Encounter (Signed)
Pt c/o irregular heart rhythm and SOB since yesterday. Speech was normal- no SOB during conversation. HX Afib. Pt is on coumadin and amiodarone and is monitored by her PCP  [place] PA-C Pt c/o of a cough x 1 month but states she does not have cold symptoms, she does not get daily weights and is unable to get BP/P either.  Pt states it would probably be easier if she went to the walk in clinic near her and decided that is what she would do.  I did make her an appointment 05/16/14@10 :66 with Dr Marlou Porch and told her to call with further need, pt was accepting of plan.

## 2014-04-15 NOTE — Telephone Encounter (Signed)
New Message  Pt called to be seen today (3/15) pt notified that we do not do same day appts, and offered to send message to Rn to have her call the pt and discuss recent symptoms. Pt refused that as well and hung up the phone. An attempt to contact the pat was made. Pt stated:   Pt c/o Shortness Of Breath: STAT if SOB developed within the last 24 hours or pt is noticeably SOB on the phone  1. Are you currently SOB (can you hear that pt is SOB on the phone)? no 2. How long have you been experiencing SOB? Last night 3. Are you SOB when sitting or when up moving around? Doesn't matter 4.  Are you currently experiencing any other symptoms? Coughing, irregular heart beat

## 2014-05-05 DIAGNOSIS — Z7901 Long term (current) use of anticoagulants: Secondary | ICD-10-CM | POA: Insufficient documentation

## 2014-05-05 DIAGNOSIS — F5101 Primary insomnia: Secondary | ICD-10-CM | POA: Insufficient documentation

## 2014-05-16 ENCOUNTER — Encounter: Payer: Self-pay | Admitting: Cardiology

## 2014-05-16 ENCOUNTER — Ambulatory Visit (INDEPENDENT_AMBULATORY_CARE_PROVIDER_SITE_OTHER): Payer: Medicare Other | Admitting: Cardiology

## 2014-05-16 VITALS — BP 124/80 | HR 59 | Ht 64.0 in | Wt 271.1 lb

## 2014-05-16 DIAGNOSIS — I4891 Unspecified atrial fibrillation: Secondary | ICD-10-CM

## 2014-05-16 DIAGNOSIS — Z79899 Other long term (current) drug therapy: Secondary | ICD-10-CM | POA: Diagnosis not present

## 2014-05-16 DIAGNOSIS — I10 Essential (primary) hypertension: Secondary | ICD-10-CM | POA: Diagnosis not present

## 2014-05-16 NOTE — Progress Notes (Signed)
[name]. [address], [address], [place]  [address] Phone: [phone] Fax:  [phone]  Date:  05/16/2014   ID:  [name], DOB [birth year], MRN [id number]  PCP:  [name]., MD   History of Present Illness: [name] is a 69 y.o. female with paroxysmal atrial fibrillation, normal EF, prior diastolic dysfunction as presentation of atrial fibrillation, diabetes, obesity here for evaluation.  She's had prior episodes of atrial fibrillation that are brief, lasting approximately one hour that are relieved with rest with . Felt SOB during. Mild dizzy. Nausea.   She states that she is well and that she does not have any problems currently with her heart. She would like to come off of amiodarone. I think this is reasonable.  Prior episode did not realize she was in atrial fibrillation. It was detected when edema was worsened.  October 2012 echocardiogram showed normal EF, borderline left atrial enlargement, trivial valvular lesions. Overall reassuring.   She is doing very well. She has been taking her medications I believe. Also, her primary physician has been monitoring her warfarin level.  She was telling me how she had a severe case of plantar fasciitis as well as shoulder pain. Back pain as well, sacral fracture.  Wt Readings from Last 3 Encounters:  05/16/14 271 lb 1.9 oz (122.979 kg)  03/02/13 260 lb (117.935 kg)  01/07/13 258 lb (117.028 kg)     Past Medical History  Diagnosis Date  . Thrombocytopenia   . Hypertension   . 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 Atrial fibrillation status post cardioversion 08/01/2011  . Benign essential HTN 08/01/2011  . Morbid obesity 01/07/2013    Past Surgical History  Procedure Laterality Date  . Abdominal hysterectomy    . Appendectomy    . Abdominal surgery    . Splenectomy, total    . Tonsillectomy    . Back surgery      Current Outpatient Prescriptions  Medication Sig Dispense  Refill  . amiodarone (PACERONE) 200 MG tablet Take 200 mg by mouth daily.      . benazepril (LOTENSIN) 5 MG tablet Take 5 mg by mouth daily.     . furosemide (LASIX) 80 MG tablet Take 40 mg by mouth daily.     Marland Kitchen HYDROcodone-acetaminophen (NORCO/VICODIN) 5-325 MG per tablet Take 2 tablets by mouth every 6 (six) hours as needed. 30 tablet 0  . levothyroxine (SYNTHROID, LEVOTHROID) 50 MCG tablet Take 25 mcg by mouth daily before breakfast.    . oxyCODONE-acetaminophen (PERCOCET) 7.5-325 MG per tablet Take 1 tablet by mouth every 4 (four) hours as needed for pain. 30 tablet 0  . predniSONE (DELTASONE) 20 MG tablet Take 3 tablets (60 mg total) by mouth daily. 15 tablet 0  . traZODone (DESYREL) 100 MG tablet Take 100 mg by mouth at bedtime.      Marland Kitchen warfarin (COUMADIN) 5 MG tablet Take 5 mg by mouth daily.       No current facility-administered medications for this visit.    Allergies:    Allergies  Allergen Reactions  . Ceftriaxone Rash    ttp  . Levofloxacin Other (See Comments)    Dropped blood platelets extremely low  . Aspirin Other (See Comments)    Patient stated she is unable to take due to blood thinner  . Oxycodone-Acetaminophen Itching  . Penicillins Rash    Pt reports anything that ends with an in    Social History:  The patient  reports that she has never smoked. She does not have any smokeless tobacco history on file. She reports that she does not drink alcohol or use illicit drugs.   ROS:  Please see the history of present illness.   Occasional back pain. No palpitations.  PHYSICAL EXAM: VS:  BP 124/80 mmHg  Pulse 59  Ht 5\' 4"  (1.626 m)  Wt 271 lb 1.9 oz (122.979 kg)  BMI 46.51 kg/m2  SpO2 95% Well nourished, well developed, in no acute distress HEENT: normal Neck: no JVD Cardiac:  normal S1, S2; RRR; no murmur Lungs:  clear to auscultation bilaterally, no wheezing, rhonchi or rales Abd: soft, nontender, no hepatomegaly Ext: no edemaOverweight Skin: warm and  dry Neuro: no focal abnormalities noted  EKG:  Patient refused today.  ASSESSMENT AND PLAN:  1. Paroxysmal atrial fibrillation - we will stop amiodarone at her request. She has had issues in the past with paroxysmal atrial fibrillation requiring cardioversion. Also, I recommended periodic EKG to ensure that there no electrical abnormalities or conduction abnormalities while on amiodarone. She did not wish to have one today. If atrial fibrillation returns becomes more problematic, we may need to reinitiate antiarrhythmic therapy. 2. Morbid obesity-encourage weight loss. She has lost weight in the past. I encouraged decreasing carbohydrates. 3. Essential hypertension-Well controlled today. Continue to monitor. 4.  Chronic anticoagulation with Coumadin-states that [name] has been refilling this medication for her. I'm fine with this. 5. We will see her back in one year or sooner if symptoms arise.  Signed, Ariel Furbish, MD [facility]  05/16/2014 10:59 AM

## 2014-05-16 NOTE — Patient Instructions (Signed)
Please stop your Amiodarone. Continue all other medications as listed.  Follow up in 1 year with Dr. Marlou Porch.  You will receive a letter in the mail 2 months before you are due.  Please call us when you receive this letter to schedule your follow up appointment.  Thank you for choosing [facility]!!

## 2015-01-05 ENCOUNTER — Telehealth: Payer: Self-pay | Admitting: Cardiology

## 2015-01-05 ENCOUNTER — Other Ambulatory Visit (HOSPITAL_COMMUNITY): Payer: Medicare Other

## 2015-01-05 ENCOUNTER — Encounter: Payer: Self-pay | Admitting: Physician Assistant

## 2015-01-05 ENCOUNTER — Ambulatory Visit (INDEPENDENT_AMBULATORY_CARE_PROVIDER_SITE_OTHER): Payer: Medicare Other | Admitting: Physician Assistant

## 2015-01-05 VITALS — BP 170/120 | HR 105 | Ht 64.0 in | Wt 267.8 lb

## 2015-01-05 DIAGNOSIS — I4891 Unspecified atrial fibrillation: Secondary | ICD-10-CM | POA: Diagnosis not present

## 2015-01-05 DIAGNOSIS — R0609 Other forms of dyspnea: Secondary | ICD-10-CM

## 2015-01-05 DIAGNOSIS — I481 Persistent atrial fibrillation: Secondary | ICD-10-CM | POA: Diagnosis not present

## 2015-01-05 DIAGNOSIS — I4819 Other persistent atrial fibrillation: Secondary | ICD-10-CM

## 2015-01-05 LAB — CBC WITH DIFFERENTIAL/PLATELET
BASOS ABS: 0.1 10*3/uL (ref 0.0–0.1)
BASOS PCT: 1 % (ref 0–1)
EOS ABS: 0.1 10*3/uL (ref 0.0–0.7)
EOS PCT: 1 % (ref 0–5)
HCT: 43.9 % (ref 36.0–46.0)
Hemoglobin: 14.4 g/dL (ref 12.0–15.0)
Lymphocytes Relative: 48 % — ABNORMAL HIGH (ref 12–46)
Lymphs Abs: 4 10*3/uL (ref 0.7–4.0)
MCH: 29.3 pg (ref 26.0–34.0)
MCHC: 32.8 g/dL (ref 30.0–36.0)
MCV: 89.2 fL (ref 78.0–100.0)
MPV: 9.4 fL (ref 8.6–12.4)
Monocytes Absolute: 0.6 10*3/uL (ref 0.1–1.0)
Monocytes Relative: 7 % (ref 3–12)
NEUTROS PCT: 43 % (ref 43–77)
Neutro Abs: 3.6 10*3/uL (ref 1.7–7.7)
PLATELETS: 513 10*3/uL — AB (ref 150–400)
RBC: 4.92 MIL/uL (ref 3.87–5.11)
RDW: 14.6 % (ref 11.5–15.5)
WBC: 8.3 10*3/uL (ref 4.0–10.5)

## 2015-01-05 LAB — BASIC METABOLIC PANEL
BUN: 10 mg/dL (ref 7–25)
CHLORIDE: 104 mmol/L (ref 98–110)
CO2: 26 mmol/L (ref 20–31)
CREATININE: 1 mg/dL — AB (ref 0.50–0.99)
Calcium: 9 mg/dL (ref 8.6–10.4)
Glucose, Bld: 98 mg/dL (ref 65–99)
Potassium: 4.5 mmol/L (ref 3.5–5.3)
Sodium: 140 mmol/L (ref 135–146)

## 2015-01-05 LAB — TSH: TSH: 3.765 u[IU]/mL (ref 0.350–4.500)

## 2015-01-05 MED ORDER — BENAZEPRIL HCL 10 MG PO TABS: 10.0000 mg | ORAL_TABLET | Freq: Every day | ORAL | Status: DC

## 2015-01-05 MED ORDER — WARFARIN SODIUM 5 MG PO TABS: 5.0000 mg | ORAL_TABLET | Freq: Every day | ORAL | Status: DC

## 2015-01-05 MED ORDER — FUROSEMIDE 80 MG PO TABS: 40.0000 mg | ORAL_TABLET | Freq: Every day | ORAL | Status: DC

## 2015-01-05 MED ORDER — METOPROLOL TARTRATE 25 MG PO TABS: 25.0000 mg | ORAL_TABLET | Freq: Two times a day (BID) | ORAL | Status: DC

## 2015-01-05 NOTE — Telephone Encounter (Signed)
Spoke with pt and she states that she seen [employer], PA-C at [facility] in [facility] today for her injection and they told her that she was in Afib and BP was elevated and that she needed to be seen in our office today. Pt states she has also been SOB for 3 weeks now and feels like she has been in Afib for about a month.  Pt states there was no EKG done at visit today at [facility]. Pt states that she has not been taking her Coumadin. Pt states that "they stopped my heart medicine so I didn't think I needed that anymore either." Spoke with Truitt Merle, NP and she said to have pt come in this afternoon at 1:45PM. Pt added to [facility] schedule at 1:45pm today. Spoke with pt and advised her of this appt and to be here at 1:30pm. Pt verbalized understanding and was in agreement with this plan.

## 2015-01-05 NOTE — Progress Notes (Signed)
CARDIOLOGY OFFICE NOTE  Date:  01/05/2015    [name] Date of Birth: March 10, 1945 Medical Record [id number]  PCP:  Robyne Peers., MD  Cardiologist:  Dr. Marlou Porch  Chief Complaint  Patient presents with  . Atrial Fibrillation  . Shortness of Breath    History of Present Illness: [name] is a 69 y.o. female who presents today for evaluation of recurrent atrial fibrillation and SOB. She has PMH of PAF, chronic diastolic HF, HTN, DM, obesity and remote h/o TTP in 2004. She denies any cardiac awareness, her previous afib was associated with worsening edema. She has a negative stress test in 03/2005. TEE during the same month prior to DCCV showed normal EF and normal LA size, mild MR. She was previously on Coumadin and amiodarone. She was last seen by Dr. Marlou Porch on 05/16/2014 at which time she has not had recurrent afib. Her previous atrial fibrillation has been brief. Her amiodarone was stopped at the patient's request. Patient felt since she no longer need one cardiac medication, she decided to stop other cardiac medication as well include lasix and coumadin. She has not taken coumadin for 6 month. She has only been taking lasix on an as needed basis. In the last 6 month, she only took lasix 3 times. Her previous weight on last office visit was 171 lbs.  She presented today for followup as her orthopedic doctor noted she was in afib. Per patient, she felt she was going in and out of afib for at least 1 month. In the last month, she also noted increasing DOE and fatigue. She denies any recent fever or chill, but does have a chronic cough. She denies increasing LE, orthopnea or PND. She occasionally wake up feeling flushed, but that's a chronic issue for her. She also noted her BP has been high, with SBP 170s today. She has been taking higher dose of 10mg  Benazepril. Her main problem seems to be SOB today     Past Medical History  Diagnosis Date  . Thrombocytopenia ([place])    . Hypertension   . 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 Atrial fibrillation status post cardioversion ([place]) 08/01/2011  . Benign essential HTN 08/01/2011  . Morbid obesity ([place]) 01/07/2013    Past Surgical History  Procedure Laterality Date  . Abdominal hysterectomy    . Appendectomy    . Abdominal surgery    . Splenectomy, total    . Tonsillectomy    . Back surgery       Medications: Current Outpatient Prescriptions  Medication Sig Dispense Refill  . levothyroxine (SYNTHROID, LEVOTHROID) 50 MCG tablet Take 25 mcg by mouth daily before breakfast.    . benazepril (LOTENSIN) 10 MG tablet Take 1 tablet (10 mg total) by mouth daily. 30 tablet 11  . furosemide (LASIX) 80 MG tablet Take 0.5 tablets (40 mg total) by mouth daily. 15 tablet 11  . HYDROcodone-acetaminophen (NORCO/VICODIN) 5-325 MG per tablet Take 2 tablets by mouth every 6 (six) hours as needed. (Patient not taking: Reported on 01/05/2015) 30 tablet 0  . metoprolol tartrate (LOPRESSOR) 25 MG tablet Take 1 tablet (25 mg total) by mouth 2 (two) times daily. 60 tablet 11  . oxyCODONE-acetaminophen (PERCOCET) 7.5-325 MG per tablet Take 1 tablet by mouth every 4 (four) hours as needed for pain. (Patient not taking: Reported on 01/05/2015) 30 tablet 0  . predniSONE (DELTASONE) 20 MG tablet Take 3 tablets (60 mg total) by mouth daily. (Patient not  taking: Reported on 01/05/2015) 15 tablet 0  . traZODone (DESYREL) 100 MG tablet Take 100 mg by mouth at bedtime.      Marland Kitchen warfarin (COUMADIN) 5 MG tablet Take 1 tablet (5 mg total) by mouth daily. 30 tablet 11   No current facility-administered medications for this visit.    Allergies: Allergies  Allergen Reactions  . Ceftriaxone Rash    ttp  . Levofloxacin Other (See Comments)    Dropped blood platelets extremely low  . Aspirin Other (See Comments)    Patient stated she is unable to take due to blood thinner  . Oxycodone-Acetaminophen Itching  . Penicillins Rash     Pt reports anything that ends with an in    Social History: The patient  reports that she has never smoked. She does not have any smokeless tobacco history on file. She reports that she does not drink alcohol or use illicit drugs.   Family History: The patient's family history includes Cancer in her brother and sister; Heart attack in her mother.   Review of Systems: Please see the history of present illness.   Otherwise, the review of systems is positive for fatigue, SOB, wheezing, DOE.   All other systems are reviewed and negative.   Physical Exam: VS:  BP 170/120 mmHg  Pulse 105  Ht 5\' 4"  (1.626 m)  Wt 267 lb 12.8 oz (121.473 kg)  BMI 45.95 kg/m2 .  BMI Body mass index is 45.95 kg/(m^2).  Wt Readings from Last 3 Encounters:  01/05/15 267 lb 12.8 oz (121.473 kg)  05/16/14 271 lb 1.9 oz (122.979 kg)  03/02/13 260 lb (117.935 kg)    General: Pleasant. Well developed, well nourished. Morbidly obese HEENT: Normal. Neck: Supple, carotid bruits, or masses noted. +JVD Cardiac: irregular. No murmurs, rubs, or gallops. Some nonpitting edema in the LE, but no obvious pitting edema Respiratory:  Lung largely clear except intermittent rale in RLL GI: Soft and nontender.  MS: No deformity or atrophy. Gait and ROM intact. Skin: Warm and dry. Color is normal.  Neuro:  Strength and sensation are intact and no gross focal deficits noted.  Psych: Alert, appropriate and with normal affect.   LABORATORY DATA:  EKG:  EKG is ordered today. This demonstrates atrial fibrillation with HR 105.  Lab Results  Component Value Date   WBC 7.9 08/06/2012   HGB 13.7 08/06/2012   HCT 42.1 08/06/2012   PLT 534* 08/06/2012   GLUCOSE 99 08/06/2012   ALT 14 08/06/2012   AST 17 08/06/2012   NA 142 08/06/2012   K 4.0 08/06/2012   CL 103 07/27/2012   CREATININE 0.9 08/06/2012   BUN 8.8 08/06/2012   CO2 29 08/06/2012   INR 3.78* 07/27/2012    Other Studies Reviewed Today:  TEE  03/2005 SUMMARY - Overall left ventricular systolic function was normal. There was    no diagnostic evidence of left ventricular regional wall    motion abnormalities. - There was no atheroma of the aortic arch. - There was mild mitral valvular regurgitation. - The left atrial appendage function was normal (normal emptying    velocity). There was no left atrial appendage thrombus    identified. IMPRESSIONS - Will proceed with DCCV.  Assessment/Plan:  1. Persistent atrial fibrillation  CHA2DS2-Vasc score 5 (HTN, DM, HF, age, female). I offered her to restart coumadin vs starting Xarelto vs eliquis, she is adamant she does not want NOAC as she has heard bad things about them, she agree to  restart coumadin. I will hold off restart amiodarone as her afib is likely subacute, and I do not want to cardiovert her at this time. She will likely need amiodarone before DCCV later as she has h/o failed cardioversion. However consideration of DCCV should be after 3-4 weeks of therapeutic INR. I will add metoprolol tartrate 25mg  BID for rate control.   - obtain CBC, BMET, TSH, BNP, PT/INR and TTE. Repeat PT/INR in 1 week at PCP's office and fax to [facility]  - restart daily lasix, coumadin, benazipril and add metoprolol  2. DOE: partially related to her morbid obesity, atrial fibrillation and possibly mild acute on chronic diastolic HF. She has mild rale in R base and elevated JVD on the L, however majority of the lung is clear. She does not have significant pitting edema in LE. Her weight is 267 lbs today, which is 4 lbs lighter than her previous weight of 271 on last visit on Apr. I discussed with Dr. Meda Coffee, will restart her previous lasix dose, hopefully can avoid hospitalization. But she will need close monitoring, I will see him in 1 week, I went over sign and symptom of HF with her and instructed her to seek medical attention or contact cardiology office if she still does not feel  better  3. HTN: uncontrolled with SBP 170s, add metoprolol 25mg  BID will help 4. DM II 5. Morbid obesity 6. H/o TTP with prolonged hospitalization in 2004  Current medicines are reviewed with the patient today.  The patient does not have concerns regarding medicines other than what has been noted above.  The following changes have been made:  See above.  Labs/ tests ordered today include:    Orders Placed This Encounter  Procedures  . Basic Metabolic Panel (BMET)  . CBC w/Diff  . TSH  . B Nat Peptide  . INR/PT  . EKG 12-Lead  . Echocardiogram     Disposition:   FU with me in 1 week.   Patient is agreeable to this plan and will call if any problems develop in the interim.   Signed: Hilbert Corrigan PA 01/05/2015 3:12 PM  [place] [address] [place], [place]  [address] Phone: [phone] Fax: [phone]

## 2015-01-05 NOTE — Patient Instructions (Signed)
Medication Instructions:  Your physician has recommended you make the following change in your medication:  1. Start Metoprolol ( 25 mg ) twice a day 2. Re-start lasix ( 40 mg ) daily 3. Re-start benazepril ( 10 mg ) daily 4. Re-start coumadin ( 5 mg ) daily   Labwork: Your physician recommends that you have lab work today   Testing/Procedures: Your physician has requested that you have an echocardiogram. Echocardiography is a painless test that uses sound waves to create images of your heart. It provides your doctor with information about the size and shape of your heart and how well your heart's chambers and valves are working. This procedure takes approximately one hour. There are no restrictions for this procedure.    Follow-Up: Your physician recommends that you keep your scheduled follow-up appointment in: one week   Any Other Special Instructions Will Be Listed Below (If Applicable).   Obtain PT/INR at Dr. Tad Moore office in one week per script. If symptoms don't improve with in 24 hours please call our office at [phone].  If you need a refill on your cardiac medications before your next appointment, please call your pharmacy.

## 2015-01-05 NOTE — Telephone Encounter (Signed)
Patient c/o Palpitations:  High priority if patient c/o lightheadedness and shortness of breath.  1. How long have you been having palpitations?3wks  2. Are you currently experiencing lightheadedness and shortness of breath?sob   3. Have you checked your BP and heart rate? (document readings) 179/120 hr 90  4. Are you experiencing any other symptoms? No  Pt went to see her osteo doctor and was told to come in office today.

## 2015-01-06 ENCOUNTER — Other Ambulatory Visit: Payer: Self-pay

## 2015-01-06 ENCOUNTER — Ambulatory Visit (HOSPITAL_COMMUNITY): Payer: Medicare Other | Attending: Cardiology

## 2015-01-06 DIAGNOSIS — Z6841 Body Mass Index (BMI) 40.0 and over, adult: Secondary | ICD-10-CM | POA: Diagnosis not present

## 2015-01-06 DIAGNOSIS — I071 Rheumatic tricuspid insufficiency: Secondary | ICD-10-CM | POA: Diagnosis not present

## 2015-01-06 DIAGNOSIS — I10 Essential (primary) hypertension: Secondary | ICD-10-CM | POA: Diagnosis not present

## 2015-01-06 DIAGNOSIS — R29898 Other symptoms and signs involving the musculoskeletal system: Secondary | ICD-10-CM | POA: Insufficient documentation

## 2015-01-06 DIAGNOSIS — I351 Nonrheumatic aortic (valve) insufficiency: Secondary | ICD-10-CM | POA: Insufficient documentation

## 2015-01-06 DIAGNOSIS — I371 Nonrheumatic pulmonary valve insufficiency: Secondary | ICD-10-CM | POA: Diagnosis not present

## 2015-01-06 DIAGNOSIS — I517 Cardiomegaly: Secondary | ICD-10-CM | POA: Diagnosis not present

## 2015-01-06 DIAGNOSIS — I340 Nonrheumatic mitral (valve) insufficiency: Secondary | ICD-10-CM | POA: Diagnosis not present

## 2015-01-06 DIAGNOSIS — I4891 Unspecified atrial fibrillation: Secondary | ICD-10-CM

## 2015-01-06 LAB — PROTIME-INR
INR: 1 (ref <1.50)
PROTHROMBIN TIME: 13.3 s (ref 11.6–15.2)

## 2015-01-06 LAB — BRAIN NATRIURETIC PEPTIDE: BRAIN NATRIURETIC PEPTIDE: 126.2 pg/mL — AB (ref 0.0–100.0)

## 2015-01-08 ENCOUNTER — Other Ambulatory Visit: Payer: Self-pay | Admitting: Physician Assistant

## 2015-01-08 ENCOUNTER — Telehealth: Payer: Self-pay | Admitting: Physician Assistant

## 2015-01-08 MED ORDER — NITROGLYCERIN 0.4 MG SL SUBL: 0.4000 mg | SUBLINGUAL_TABLET | SUBLINGUAL | Status: DC | PRN

## 2015-01-08 NOTE — Telephone Encounter (Signed)
Call the patient to inform her recent lab and echo result, she feels much better after her HR is better controlled and she is back on lasix. I asked her if she has significant chest pain before, she says she has a few acid reflux symptom resolved with burping, but a year ago, she did not a really bad episode. I told her I have send to her pharmacy a Rx for nitroglycerine in case she has significant chest pain again. Otherwise I will see her in the clinic to discuss potential cardiac catheterization. The echo image has been went over with Dr. Marlou Porch, who agree she will need a cath.  Hilbert Corrigan PA Pager: [phone]

## 2015-01-12 LAB — PROTIME-INR: INR: 1.7 — AB (ref 0.9–1.1)

## 2015-01-13 ENCOUNTER — Ambulatory Visit: Payer: Medicare Other | Admitting: Nurse Practitioner

## 2015-01-13 ENCOUNTER — Encounter: Payer: Self-pay | Admitting: Physician Assistant

## 2015-01-13 ENCOUNTER — Ambulatory Visit (INDEPENDENT_AMBULATORY_CARE_PROVIDER_SITE_OTHER): Payer: Medicare Other | Admitting: Physician Assistant

## 2015-01-13 VITALS — BP 148/80 | HR 72 | Ht 64.0 in | Wt 260.2 lb

## 2015-01-13 DIAGNOSIS — I10 Essential (primary) hypertension: Secondary | ICD-10-CM | POA: Diagnosis not present

## 2015-01-13 DIAGNOSIS — I481 Persistent atrial fibrillation: Secondary | ICD-10-CM

## 2015-01-13 DIAGNOSIS — I519 Heart disease, unspecified: Secondary | ICD-10-CM

## 2015-01-13 DIAGNOSIS — I4819 Other persistent atrial fibrillation: Secondary | ICD-10-CM

## 2015-01-13 LAB — BASIC METABOLIC PANEL
BUN: 19 mg/dL (ref 7–25)
CHLORIDE: 99 mmol/L (ref 98–110)
CO2: 27 mmol/L (ref 20–31)
CREATININE: 1.26 mg/dL — AB (ref 0.50–0.99)
Calcium: 9.3 mg/dL (ref 8.6–10.4)
Glucose, Bld: 95 mg/dL (ref 65–99)
POTASSIUM: 4.3 mmol/L (ref 3.5–5.3)
Sodium: 139 mmol/L (ref 135–146)

## 2015-01-13 MED ORDER — ISOSORBIDE MONONITRATE ER 30 MG PO TB24
30.0000 mg | ORAL_TABLET | Freq: Every day | ORAL | Status: DC
Start: 2015-01-13 — End: 2015-04-19

## 2015-01-13 MED ORDER — METOPROLOL SUCCINATE ER 50 MG PO TB24: 50.0000 mg | ORAL_TABLET | Freq: Every day | ORAL | Status: DC

## 2015-01-13 NOTE — Patient Instructions (Signed)
Medication Instructions:  Your physician has recommended you make the following change in your medication:  1. Discontinue Metoprolol Tartrate 2. Start Metoprolol succinate ( 50 mg ) daily 3. Start Imdur ( 30 mg ) daily Medication sent into [facility] on [address]   Labwork: Your physician recommends that you have  lab work in: bmet   Testing/Procedures: -None  Follow-Up: Your physician recommends that you keep your scheduled  follow-up appointment with Dr. Marlou Porch   Any Other Special Instructions Will Be Listed Below (If Applicable).     If you need a refill on your cardiac medications before your next appointment, please call your pharmacy.

## 2015-01-13 NOTE — Progress Notes (Signed)
Cardiology Office Note   Date:  01/13/2015   ID:  [name], DOB 1945-03-09, MRN [id number]  PCP:  Robyne Peers., MD  Cardiologist:  Dr. Marlou Porch   Chief Complaint  Patient presents with  . Follow-up    seen for Dr. Marlou Porch, atrial fibrillation, LV dysfunction  . LV dysfunction  . Atrial Fibrillation      History of Present Illness: [name] is a 69 y.o. female who presents today for evaluation of recurrent atrial fibrillation and SOB. She has PMH of PAF, chronic diastolic HF, HTN, DM, obesity and remote h/o TTP in 2004. She denies any cardiac awareness, her previous afib was associated with worsening edema. She has a negative stress test in 03/2005. TEE during the same month prior to DCCV showed normal EF and normal LA size, mild MR. She was previously on Coumadin and amiodarone. She was last seen by Dr. Marlou Porch on 05/16/2014 at which time she has not had recurrent afib. Her previous atrial fibrillation has been brief. Her amiodarone was stopped at the patient's request. Patient felt since she no longer need one cardiac medication, she decided to stop other cardiac medication as well include lasix and coumadin. She has not taken coumadin for 6 month. She has only been taking lasix on an as needed basis. In the last 6 month, she only took lasix 3 times. Her previous weight on office visit in Apr was 171 lbs. She does have occasional chest discomfort relieved with burping. She has a particularly worse episode 1 year ago. I saw her in the clinic on 01/05/2015 at which time she was complaining of significant SOB, her SBP was 170s. Her HR was 100-110s and was in atrial fibrillation on EKG. I started her on metoprolol tartrate 25mg  BID for rate control and restarted her on lasix 80mg  daily. She has been restarted on higher dose of 10mg  benazepril. Outpatient echo was obtained on 01/06/2015 which showed EF 25-30%, akinesis of entire inferoseptal and entire anterior myocardium, mild  MR, PA peak pressure 51mmHG.   She presents today for followup, she says her symptom has significantly improved. She no longer require wheelchair in the office, she has been out shopping. She denies recent chest pain. Her BP still mildly high at 148/80, but much improved compare to previous BP.     Past Medical History  Diagnosis Date  . Thrombocytopenia ([place])   . Hypertension   . 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 Atrial fibrillation status post cardioversion ([place]) 08/01/2011  . Benign essential HTN 08/01/2011  . Morbid obesity ([place]) 01/07/2013    Past Surgical History  Procedure Laterality Date  . Abdominal hysterectomy    . Appendectomy    . Abdominal surgery    . Splenectomy, total    . Tonsillectomy    . Back surgery       Current Outpatient Prescriptions  Medication Sig Dispense Refill  . benazepril (LOTENSIN) 10 MG tablet Take 1 tablet (10 mg total) by mouth daily. 30 tablet 11  . furosemide (LASIX) 80 MG tablet Take 80 mg by mouth daily.    Marland Kitchen levothyroxine (SYNTHROID, LEVOTHROID) 50 MCG tablet Take 50 mcg by mouth daily before breakfast.    . nitroGLYCERIN (NITROSTAT) 0.4 MG SL tablet Place 1 tablet (0.4 mg total) under the tongue every 5 (five) minutes as needed for chest pain. 25 tablet 3  . warfarin (COUMADIN) 5 MG tablet Take 1 tablet (5 mg total) by mouth daily. [place]  tablet 11  . isosorbide mononitrate (IMDUR) 30 MG 24 hr tablet Take 1 tablet (30 mg total) by mouth daily. 30 tablet 11  . metoprolol succinate (TOPROL-XL) 50 MG 24 hr tablet Take 1 tablet (50 mg total) by mouth daily. Take with or immediately following a meal. 30 tablet 11   No current facility-administered medications for this visit.    Allergies:   Ceftriaxone; Levofloxacin; Aspirin; Oxycodone-acetaminophen; and Penicillins    Social History:  The patient  reports that she has never smoked. She does not have any smokeless tobacco history on file. She reports that she does not  drink alcohol or use illicit drugs.   Family History:  The patient's family history includes Cancer in her brother and sister; Heart attack in her mother.    ROS:  Please see the history of present illness.   Otherwise, review of systems are positive for none.   All other systems are reviewed and negative.    PHYSICAL EXAM: VS:  BP 148/80 mmHg  Pulse 72  Ht 5\' 4"  (1.626 m)  Wt 260 lb 3.2 oz (118.026 kg)  BMI 44.64 kg/m2 , BMI Body mass index is 44.64 kg/(m^2). GEN: Well nourished, well developed, in no acute distress HEENT: normal Neck: no JVD, carotid bruits, or masses Cardiac: irregularly irregular; no murmurs, rubs, or gallops,no edema  Respiratory:  clear to auscultation bilaterally, normal work of breathing GI: soft, nontender, nondistended, + BS MS: no deformity or atrophy Skin: warm and dry, no rash Neuro:  Strength and sensation are intact Psych: euthymic mood, full affect   EKG:  EKG is ordered today. The ekg ordered today demonstrates atrial fibrillation, rate controlled with HR 72   Recent Labs: 01/05/2015: BUN 10; Creat 1.00*; Hemoglobin 14.4; Platelets 513*; Potassium 4.5; Sodium 140; TSH 3.765    Lipid Panel No results found for: CHOL, TRIG, HDL, CHOLHDL, VLDL, LDLCALC, LDLDIRECT    Wt Readings from Last 3 Encounters:  01/13/15 260 lb 3.2 oz (118.026 kg)  01/05/15 267 lb 12.8 oz (121.473 kg)  05/16/14 271 lb 1.9 oz (122.979 kg)      Other studies Reviewed: Additional studies/ records that were reviewed today include: previous office visit. Recent echocardiogram. Review of the above records demonstrates: new LV dysfunction on recent echo compare to previous Echo in 2007   ASSESSMENT AND PLAN:  1. Chronic atrial fibrillation  - CHA2DS2-Vasc score 5 (HTN, DM, HF, age, female). She did not want to consider NOAC. I have restarted her on coumadin, she says her PCP will manage it. She did obtain PT/INR at her PCP's office yesterday, her PCP will fax it to [place]  once result is back. Her HR is much better controlled today with HR 72. Given LV dysfunction, I will change metoprolol tartrate 25mg  BID to metoprolol succinate 50mg  daily. She will followup with Dr. Marlou Porch in 2 month at which time, she will need to repeat echo  2. LV dysfunction: newly diagnosed on recent echo  - Echo 01/06/2015 EF 25-30%, akinesis of entire inferoseptal and entire anterior myocardium, mild MR, PA peak pressure 74mmHG  - discussed with Dr. Marlou Porch, since she is doing so well, without significant recent anginal symptom, plan for medical therapy for 3 month. Plan to schedule 3 month repeat echo on followup. If EF still low, will consider cardiac catheterization  - i restarted her lasix 80mg  daily, i will obtain a repeat BMET, if Cr trended up, I will cut it down to 40mg  daily. She appears to  be euvolemic on exam  3. Dyspnea on exertion- resolved after HTN and HR better controlled on metoprolol and she was restarted on lasix  4. HTN: SBP still high at 148/80. I changed metoprolol tartrate 25mg  BID to toprol XL 50mg  daily given LV dysfunction. I will also add Imdur 30mg  daily to help with BP  5. Morbid obesity: not a good candidate for myoview  6. H/o TTP with prolonged hospitalization in 2004   Current medicines are reviewed at length with the patient today.  The patient does not have concerns regarding medicines.  The following changes have been made:  Change metoprolol tartrate to metoprolol succinate 50mg  daily, add Imdur 30mg  daily.   Labs/ tests ordered today include: BMET  Orders Placed This Encounter  Procedures  . Basic Metabolic Panel (BMET)  . EKG 12-Lead     Disposition:   FU with Dr. Marlou Porch in 2 months  Signed, Almyra Deforest PA 01/13/2015 1:16 PM    [place], [place], [place]  [address] Phone: [phone]; Fax: [phone]

## 2015-01-14 ENCOUNTER — Telehealth: Payer: Self-pay | Admitting: Physician Assistant

## 2015-01-14 ENCOUNTER — Other Ambulatory Visit: Payer: Self-pay | Admitting: Physician Assistant

## 2015-01-14 DIAGNOSIS — I5022 Chronic systolic (congestive) heart failure: Secondary | ICD-10-CM

## 2015-01-14 MED ORDER — FUROSEMIDE 80 MG PO TABS: 40.0000 mg | ORAL_TABLET | Freq: Every day | ORAL | Status: DC

## 2015-01-14 NOTE — Telephone Encounter (Signed)
BMET came back, Cr up slightly. Concerned that 80mg  lasix is making her too dry. Patient is away visiting her sister. Discussed with husband, instructed patient to cut lasix down to 40mg  daily and call cardiology if has increasing SOB. She will obtain BMET at her PCP's office in 2 weeks to recheck renal function.  Hilbert Corrigan PA Pager: [phone]

## 2015-01-16 ENCOUNTER — Telehealth: Payer: Self-pay | Admitting: *Deleted

## 2015-01-16 NOTE — Telephone Encounter (Signed)
Follow up  ° ° ° °Returning call back to nurse  °

## 2015-01-16 NOTE — Telephone Encounter (Signed)
Spoke with [name] who states she will have to ask [place] on Monday if she will be willing to manage Coumadin since the pt has been non-complaint in the past.  She will call back on Monday to let us know.

## 2015-01-16 NOTE — Telephone Encounter (Signed)
Pt has a Hx of At Fib.  She had stopped her coumadin on her own but was seen here recently for recurrent At Fib and coumadin was restarted at 5 mg a day on 01/05/15.  Received INR from PCP dated 01/12/15 of 1.7/PT 17.7.  Pt would like to have INRs followed there if possible instead of her having to drive to our office.  We have not previous followed her INR and it had been done by PCP before.   Left message for Dr Carren Rang nurse to call back to discuss and make sure they will follow with pt.

## 2015-01-19 NOTE — Telephone Encounter (Signed)
Follow up      Calling to let the nurse know that [place] want cardiology to manage patient's PT/INR.

## 2015-01-20 NOTE — Telephone Encounter (Signed)
Scheduled to be seen in [place] 12/21 at 10:35 as a new pt since she has not been followed here before.  Pt is aware of the appt and aware her PCP told her she would need to be followed here since she was non-compliant for them.

## 2015-01-21 ENCOUNTER — Ambulatory Visit (INDEPENDENT_AMBULATORY_CARE_PROVIDER_SITE_OTHER): Payer: Medicare Other | Admitting: *Deleted

## 2015-01-21 DIAGNOSIS — I481 Persistent atrial fibrillation: Secondary | ICD-10-CM

## 2015-01-21 DIAGNOSIS — I4819 Other persistent atrial fibrillation: Secondary | ICD-10-CM

## 2015-01-21 DIAGNOSIS — Z5181 Encounter for therapeutic drug level monitoring: Secondary | ICD-10-CM | POA: Diagnosis not present

## 2015-01-21 LAB — POCT INR: INR: 2.9

## 2015-01-21 NOTE — Patient Instructions (Signed)

## 2015-01-29 ENCOUNTER — Ambulatory Visit (INDEPENDENT_AMBULATORY_CARE_PROVIDER_SITE_OTHER): Payer: Medicare Other | Admitting: Pharmacist

## 2015-01-29 DIAGNOSIS — Z5181 Encounter for therapeutic drug level monitoring: Secondary | ICD-10-CM

## 2015-01-29 DIAGNOSIS — I4819 Other persistent atrial fibrillation: Secondary | ICD-10-CM

## 2015-01-29 DIAGNOSIS — I481 Persistent atrial fibrillation: Secondary | ICD-10-CM | POA: Diagnosis not present

## 2015-01-29 LAB — POCT INR: INR: 1.9

## 2015-02-05 ENCOUNTER — Ambulatory Visit (INDEPENDENT_AMBULATORY_CARE_PROVIDER_SITE_OTHER): Payer: Medicare Other | Admitting: *Deleted

## 2015-02-05 DIAGNOSIS — I4819 Other persistent atrial fibrillation: Secondary | ICD-10-CM

## 2015-02-05 DIAGNOSIS — I481 Persistent atrial fibrillation: Secondary | ICD-10-CM

## 2015-02-05 DIAGNOSIS — Z5181 Encounter for therapeutic drug level monitoring: Secondary | ICD-10-CM | POA: Diagnosis not present

## 2015-02-05 LAB — POCT INR: INR: 2

## 2015-02-11 ENCOUNTER — Ambulatory Visit (INDEPENDENT_AMBULATORY_CARE_PROVIDER_SITE_OTHER): Payer: Medicare Other | Admitting: *Deleted

## 2015-02-11 DIAGNOSIS — Z5181 Encounter for therapeutic drug level monitoring: Secondary | ICD-10-CM | POA: Diagnosis not present

## 2015-02-11 DIAGNOSIS — I481 Persistent atrial fibrillation: Secondary | ICD-10-CM

## 2015-02-11 DIAGNOSIS — I4819 Other persistent atrial fibrillation: Secondary | ICD-10-CM

## 2015-02-11 LAB — POCT INR: INR: 2.5

## 2015-02-18 ENCOUNTER — Ambulatory Visit (INDEPENDENT_AMBULATORY_CARE_PROVIDER_SITE_OTHER): Payer: Medicare Other | Admitting: *Deleted

## 2015-02-18 DIAGNOSIS — I481 Persistent atrial fibrillation: Secondary | ICD-10-CM

## 2015-02-18 DIAGNOSIS — Z5181 Encounter for therapeutic drug level monitoring: Secondary | ICD-10-CM | POA: Diagnosis not present

## 2015-02-18 DIAGNOSIS — I4819 Other persistent atrial fibrillation: Secondary | ICD-10-CM

## 2015-02-18 LAB — POCT INR: INR: 1.9

## 2015-02-25 ENCOUNTER — Ambulatory Visit (INDEPENDENT_AMBULATORY_CARE_PROVIDER_SITE_OTHER): Payer: Medicare Other | Admitting: *Deleted

## 2015-02-25 DIAGNOSIS — Z5181 Encounter for therapeutic drug level monitoring: Secondary | ICD-10-CM

## 2015-02-25 DIAGNOSIS — I481 Persistent atrial fibrillation: Secondary | ICD-10-CM | POA: Diagnosis not present

## 2015-02-25 DIAGNOSIS — I4819 Other persistent atrial fibrillation: Secondary | ICD-10-CM

## 2015-02-25 LAB — POCT INR: INR: 2.3

## 2015-03-04 ENCOUNTER — Ambulatory Visit (INDEPENDENT_AMBULATORY_CARE_PROVIDER_SITE_OTHER): Payer: Medicare Other | Admitting: Pharmacist

## 2015-03-04 DIAGNOSIS — Z5181 Encounter for therapeutic drug level monitoring: Secondary | ICD-10-CM | POA: Diagnosis not present

## 2015-03-04 DIAGNOSIS — I481 Persistent atrial fibrillation: Secondary | ICD-10-CM

## 2015-03-04 DIAGNOSIS — I4819 Other persistent atrial fibrillation: Secondary | ICD-10-CM

## 2015-03-04 LAB — POCT INR: INR: 2.8

## 2015-03-11 ENCOUNTER — Ambulatory Visit (INDEPENDENT_AMBULATORY_CARE_PROVIDER_SITE_OTHER): Payer: Medicare Other | Admitting: Surgery

## 2015-03-11 DIAGNOSIS — Z5181 Encounter for therapeutic drug level monitoring: Secondary | ICD-10-CM

## 2015-03-11 DIAGNOSIS — I481 Persistent atrial fibrillation: Secondary | ICD-10-CM

## 2015-03-11 DIAGNOSIS — I4819 Other persistent atrial fibrillation: Secondary | ICD-10-CM

## 2015-03-11 LAB — POCT INR: INR: 2.3

## 2015-03-18 ENCOUNTER — Encounter: Payer: Self-pay | Admitting: Cardiology

## 2015-03-18 ENCOUNTER — Ambulatory Visit (INDEPENDENT_AMBULATORY_CARE_PROVIDER_SITE_OTHER): Payer: Medicare Other | Admitting: Surgery

## 2015-03-18 ENCOUNTER — Ambulatory Visit (INDEPENDENT_AMBULATORY_CARE_PROVIDER_SITE_OTHER): Payer: Medicare Other | Admitting: Cardiology

## 2015-03-18 VITALS — BP 130/80 | HR 74 | Ht 64.0 in | Wt 268.8 lb

## 2015-03-18 DIAGNOSIS — Z5181 Encounter for therapeutic drug level monitoring: Secondary | ICD-10-CM

## 2015-03-18 DIAGNOSIS — I481 Persistent atrial fibrillation: Secondary | ICD-10-CM

## 2015-03-18 DIAGNOSIS — I4819 Other persistent atrial fibrillation: Secondary | ICD-10-CM

## 2015-03-18 LAB — POCT INR: INR: 2.4

## 2015-03-18 MED ORDER — METOPROLOL SUCCINATE ER 100 MG PO TB24: 100.0000 mg | ORAL_TABLET | Freq: Every day | ORAL | Status: DC

## 2015-03-18 NOTE — Progress Notes (Signed)
[facility]. [address], [place]  [address] Phone: [phone] Fax:  [phone]  Date:  03/18/2015   ID:  [name], DOB 08-14-1945, MRN [id number]  PCP:  Robyne Peers., MD   History of Present Illness: [name] is a 70 y.o. female with paroxysmal atrial fibrillation, previously normal EF now 123456, prior diastolic dysfunction as presentation of atrial fibrillation, diabetes, obesity here for follow up.   She was seen last on 01/05/15 by [place], [place]. Note reviewed, is found to be back in atrial fibrillation by orthopedic doctor. This was after she discontinued her amiodarone. She stopped all other cardiac medications as well. She had not been on Coumadin as well for over 6 months prior to this. She noted increasing dyspnea and fatigue. Blood pressure was quite high 170s. ECHO was performed on 01/06/15 which showed an ejection fraction of 25-30%. This may be related to uncontrolled hypertension as well as potentially A. fib with RVR.  Anticoagulation with novel agent was offered adamant no. She agreed to start Coumadin again. She also did not wish to restart amiodarone. It is likely that rate control strategy will continue.   Weight has been 271 pounds. Husband has cancer of bladder.   Overall she is feeling much improved, less shortness of breath, no chest pain, no bleeding, no syncope. See below.   She's had prior episodes of atrial fibrillation that are brief, lasting approximately one hour that are relieved with rest with . Felt SOB during. Mild dizzy. Nausea.   October 2012 echocardiogram showed normal EF, borderline left atrial enlargement, trivial valvular lesions. Overall reassuring. Most recent echocardiogram December 2016 showed EF of 25%.    Wt Readings from Last 3 Encounters:  03/18/15 268 lb 12.8 oz (121.927 kg)  01/13/15 260 lb 3.2 oz (118.026 kg)  01/05/15 267 lb 12.8 oz (121.473 kg)     Past Medical History  Diagnosis Date  .  Thrombocytopenia ([place])   . Hypertension   . 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 Atrial fibrillation status post cardioversion ([place]) 08/01/2011  . Benign essential HTN 08/01/2011  . Morbid obesity ([place]) 01/07/2013    Past Surgical History  Procedure Laterality Date  . Abdominal hysterectomy    . Appendectomy    . Abdominal surgery    . Splenectomy, total    . Tonsillectomy    . Back surgery      Current Outpatient Prescriptions  Medication Sig Dispense Refill  . benazepril (LOTENSIN) 10 MG tablet Take 1 tablet (10 mg total) by mouth daily. 30 tablet 11  . furosemide (LASIX) 20 MG tablet Take 20 mg by mouth daily.    . isosorbide mononitrate (IMDUR) 30 MG 24 hr tablet Take 1 tablet (30 mg total) by mouth daily. 30 tablet 11  . levothyroxine (SYNTHROID, LEVOTHROID) 50 MCG tablet Take 50 mcg by mouth daily before breakfast.    . metoprolol succinate (TOPROL-XL) 50 MG 24 hr tablet Take 1 tablet (50 mg total) by mouth daily. Take with or immediately following a meal. 30 tablet 11  . nitroGLYCERIN (NITROSTAT) 0.4 MG SL tablet Place 1 tablet (0.4 mg total) under the tongue every 5 (five) minutes as needed for chest pain. 25 tablet 3  . warfarin (COUMADIN) 5 MG tablet Take 1 tablet (5 mg total) by mouth daily. 30 tablet 11   No current facility-administered medications for this visit.    Allergies:    Allergies  Allergen Reactions  .  Ceftriaxone Rash    ttp  . Levofloxacin Other (See Comments)    Dropped blood platelets extremely low  . Aspirin Other (See Comments)    Patient stated she is unable to take due to blood thinner  . Oxycodone-Acetaminophen Itching  . Penicillins Rash    Pt reports anything that ends with an in    Social History:  The patient  reports that she has never smoked. She does not have any smokeless tobacco history on file. She reports that she does not drink alcohol or use illicit drugs.   ROS:  Please see the history of present illness.    Occasional back pain. No palpitations.  PHYSICAL EXAM: VS:  BP 130/80 mmHg  Pulse 74  Ht 5\' 4"  (1.626 m)  Wt 268 lb 12.8 oz (121.927 kg)  BMI 46.12 kg/m2 Well nourished, well developed, in no acute distress HEENT: normal Neck: no JVD Cardiac:  normal S1, S2; RRR; no murmur Lungs:  clear to auscultation bilaterally, no wheezing, rhonchi or rales Abd: soft, nontender, no hepatomegalyobese Ext: no edemaOverweight Skin: warm and dry Neuro: no focal abnormalities noted  EKG:  EKG was ordered today 03/18/15-atrial fib heart rate 74 bpm, rightward axis. Much improved rate.  ASSESSMENT AND PLAN:  1. Paroxysmal atrial fibrillation - now appears fairly persistent. Thankfully, she is feeling much better with good rate control and good control of her blood pressure. We will go ahead and increase her Toprol to 100 mg once a day. Her ejection fraction was 25% in December 2016. Her breathing is improved. Because she is markedly improved from a symptom medic standpoint with rate controlled atrial fibrillation and because long-term successful rhythm control will be unlikely without antiarrhythmic (she used to be on amiodarone but does not wish to take this anymore), we will stop plans for cardioversion. Continue with rate control strategy.  2. Morbid obesity-encourage weight loss. She has lost weight in the past. I encouraged decreasing carbohydrates. 3. Essential hypertension-Well controlled today. Previously was uncontrolled. This may have led to her cardiomyopathy. Repeating echocardiogram in March. Continue to monitor. 4.  Chronic anticoagulation with Coumadin-we are watching this here in our clinic. 5. Chronic systolic heart failure/cardio myopathy-as above. Increasing beta blocker. She is on ACE inhibitor. Lasix is currently 20 mg. Maintaining weight. Feeling much better. 6. We will see her back in one month following echocardiogram.  Signed, [name], MD [facility]  03/18/2015 9:39 AM

## 2015-03-18 NOTE — Patient Instructions (Signed)
Medication Instructions:  Please increase your Toprol to 100 mg a day. Continue all other medications as listed.  Testing/Procedures: .Your physician has requested that you have an echocardiogram. Echocardiography is a painless test that uses sound waves to create images of your heart. It provides your doctor with information about the size and shape of your heart and how well your heart's chambers and valves are working. This procedure takes approximately one hour. There are no restrictions for this procedure.  Follow-Up: Follow up in 1 month with Dr Marlou Porch.  If you need a refill on your cardiac medications before your next appointment, please call your pharmacy.  Thank you for choosing [facility]!!

## 2015-03-31 ENCOUNTER — Ambulatory Visit (HOSPITAL_COMMUNITY): Payer: Medicare Other | Attending: Cardiovascular Disease

## 2015-03-31 ENCOUNTER — Other Ambulatory Visit: Payer: Self-pay | Admitting: Cardiology

## 2015-03-31 ENCOUNTER — Other Ambulatory Visit: Payer: Self-pay

## 2015-03-31 DIAGNOSIS — Z6841 Body Mass Index (BMI) 40.0 and over, adult: Secondary | ICD-10-CM | POA: Insufficient documentation

## 2015-03-31 DIAGNOSIS — E119 Type 2 diabetes mellitus without complications: Secondary | ICD-10-CM | POA: Insufficient documentation

## 2015-03-31 DIAGNOSIS — I4819 Other persistent atrial fibrillation: Secondary | ICD-10-CM

## 2015-03-31 DIAGNOSIS — I4891 Unspecified atrial fibrillation: Secondary | ICD-10-CM | POA: Diagnosis present

## 2015-03-31 DIAGNOSIS — I119 Hypertensive heart disease without heart failure: Secondary | ICD-10-CM | POA: Insufficient documentation

## 2015-03-31 DIAGNOSIS — I481 Persistent atrial fibrillation: Secondary | ICD-10-CM | POA: Insufficient documentation

## 2015-03-31 NOTE — Progress Notes (Signed)
   Patient ID: [name], female    DOB: 24-Apr-1945, 70 y.o.   MRN: [id number]  HPI    Review of Systems    Physical Exam     Limited EF check per Dr Acie Fredrickson.

## 2015-04-15 ENCOUNTER — Encounter: Payer: Self-pay | Admitting: Cardiology

## 2015-04-15 ENCOUNTER — Ambulatory Visit (INDEPENDENT_AMBULATORY_CARE_PROVIDER_SITE_OTHER): Payer: Medicare Other | Admitting: *Deleted

## 2015-04-15 ENCOUNTER — Encounter: Payer: Self-pay | Admitting: *Deleted

## 2015-04-15 ENCOUNTER — Ambulatory Visit (INDEPENDENT_AMBULATORY_CARE_PROVIDER_SITE_OTHER): Payer: Medicare Other | Admitting: Cardiology

## 2015-04-15 VITALS — BP 130/76 | HR 78 | Ht 64.0 in | Wt 270.0 lb

## 2015-04-15 DIAGNOSIS — I110 Hypertensive heart disease with heart failure: Secondary | ICD-10-CM

## 2015-04-15 DIAGNOSIS — F419 Anxiety disorder, unspecified: Secondary | ICD-10-CM | POA: Diagnosis not present

## 2015-04-15 DIAGNOSIS — Z5181 Encounter for therapeutic drug level monitoring: Secondary | ICD-10-CM

## 2015-04-15 DIAGNOSIS — I4819 Other persistent atrial fibrillation: Secondary | ICD-10-CM

## 2015-04-15 DIAGNOSIS — Z01818 Encounter for other preprocedural examination: Secondary | ICD-10-CM

## 2015-04-15 DIAGNOSIS — I481 Persistent atrial fibrillation: Secondary | ICD-10-CM

## 2015-04-15 DIAGNOSIS — Z7901 Long term (current) use of anticoagulants: Secondary | ICD-10-CM

## 2015-04-15 LAB — BASIC METABOLIC PANEL
BUN: 15 mg/dL (ref 7–25)
CALCIUM: 8.7 mg/dL (ref 8.6–10.4)
CO2: 25 mmol/L (ref 20–31)
Chloride: 103 mmol/L (ref 98–110)
Creat: 1.12 mg/dL — ABNORMAL HIGH (ref 0.50–0.99)
Glucose, Bld: 100 mg/dL — ABNORMAL HIGH (ref 65–99)
POTASSIUM: 4.3 mmol/L (ref 3.5–5.3)
SODIUM: 141 mmol/L (ref 135–146)

## 2015-04-15 LAB — CBC
HEMATOCRIT: 47.7 % — AB (ref 36.0–46.0)
HEMOGLOBIN: 16 g/dL — AB (ref 12.0–15.0)
MCH: 30 pg (ref 26.0–34.0)
MCHC: 33.5 g/dL (ref 30.0–36.0)
MCV: 89.5 fL (ref 78.0–100.0)
MPV: 9 fL (ref 8.6–12.4)
Platelets: 547 10*3/uL — ABNORMAL HIGH (ref 150–400)
RBC: 5.33 MIL/uL — AB (ref 3.87–5.11)
RDW: 14.7 % (ref 11.5–15.5)
WBC: 10 10*3/uL (ref 4.0–10.5)

## 2015-04-15 LAB — POCT INR: INR: 1.7

## 2015-04-15 NOTE — Patient Instructions (Signed)
Medication Instructions:  The current medical regimen is effective;  continue present plan and medications. You will need to hold your Warfarin 4 days prior to your cath.  Labwork: [facility]  Follow-Up: Follow up in approximately 2 weeks after your cardiac cath.  If you need a refill on your cardiac medications before your next appointment, please call your pharmacy.  Thank you for choosing [facility]!!

## 2015-04-15 NOTE — Progress Notes (Addendum)
[facility] [place]. [address], [address], [place]  [address] Phone: [phone] Fax:  [phone]  Date:  04/15/2015   ID:  [name], DOB November 19, 1945, MRN [id number]  PCP:  Robyne Peers., MD   History of Present Illness: [name] is a 70 y.o. female with paroxysmal atrial fibrillation, previously normal EF now 123456, prior diastolic dysfunction as presentation of atrial fibrillation, diabetes, obesity here for follow up.   She was seen last on 01/05/15 by [place], [place]. Note reviewed, is found to be back in atrial fibrillation by orthopedic doctor. This was after she discontinued her amiodarone. She stopped all other cardiac medications as well. She had not been on Coumadin as well for over 6 months prior to this. She noted increasing dyspnea and fatigue. Blood pressure was quite high 170s. ECHO was performed on 01/06/15 which showed an ejection fraction of 25-30%. This may be related to uncontrolled hypertension as well as potentially A. fib with RVR.  Anticoagulation with novel agent was offered adamant no. She agreed to start Coumadin again. She also did not wish to restart amiodarone. It is likely that rate control strategy will continue.   Weight has been 271 pounds. Husband has cancer of bladder.   Overall she is feeling much improved, less shortness of breath, no chest pain, no bleeding, no syncope. See below.   She's had prior episodes of atrial fibrillation that are brief, lasting approximately one hour that are relieved with rest with . Felt SOB during. Mild dizzy. Nausea.   October 2012 echocardiogram showed normal EF, borderline left atrial enlargement, trivial valvular lesions. Overall reassuring. Most recent echocardiogram December 2016 showed EF of 25%.   04/15/15-she is currently crying at times with anxiety surrounding her husband who has bladder cancer. Her daughter [name] from [place] is here today. We discussed the findings of the repeat echocardiogram  after adequate rate control of her atrial fibrillation and her ejection fraction remains low in the 25% range. Previously several years ago, her EF was normal. She does overall report that she is feeling better with current medical therapy. She is never had a heart catheterization. She has high anxiety.   Note, she had prior history of TTP. No further sequela currently.    Wt Readings from Last 3 Encounters:  04/15/15 270 lb (122.471 kg)  03/18/15 268 lb 12.8 oz (121.927 kg)  01/13/15 260 lb 3.2 oz (118.026 kg)     Past Medical History  Diagnosis Date  . Thrombocytopenia ([place])   . Hypertension   . 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 Atrial fibrillation status post cardioversion ([place]) 08/01/2011  . Benign essential HTN 08/01/2011  . Morbid obesity ([place]) 01/07/2013    Past Surgical History  Procedure Laterality Date  . Abdominal hysterectomy    . Appendectomy    . Abdominal surgery    . Splenectomy, total    . Tonsillectomy    . Back surgery      Current Outpatient Prescriptions  Medication Sig Dispense Refill  . benazepril (LOTENSIN) 10 MG tablet Take 1 tablet (10 mg total) by mouth daily. 30 tablet 11  . furosemide (LASIX) 20 MG tablet Take 20 mg by mouth daily.    . isosorbide mononitrate (IMDUR) 30 MG 24 hr tablet Take 1 tablet (30 mg total) by mouth daily. 30 tablet 11  . levothyroxine (SYNTHROID, LEVOTHROID) 50 MCG tablet Take 50 mcg by mouth daily before breakfast.    . metoprolol  succinate (TOPROL-XL) 100 MG 24 hr tablet Take 1 tablet (100 mg total) by mouth daily. Take with or immediately following a meal. 30 tablet 6  . nitroGLYCERIN (NITROSTAT) 0.4 MG SL tablet Place 1 tablet (0.4 mg total) under the tongue every 5 (five) minutes as needed for chest pain. 25 tablet 3  . warfarin (COUMADIN) 5 MG tablet Take 1 tablet (5 mg total) by mouth daily. 30 tablet 11   No current facility-administered medications for this visit.    Allergies:    Allergies   Allergen Reactions  . Ceftriaxone Rash    ttp  . Levofloxacin Other (See Comments)    Dropped blood platelets extremely low  . Aspirin Other (See Comments)    Patient stated she is unable to take due to blood thinner  . Oxycodone-Acetaminophen Itching  . Penicillins Rash    Pt reports anything that ends with an in    Social History:  The patient  reports that she has never smoked. She does not have any smokeless tobacco history on file. She reports that she does not drink alcohol or use illicit drugs.   ROS:  Please see the history of present illness.   Occasional back pain. No palpitations.  PHYSICAL EXAM: VS:  BP 130/76 mmHg  Pulse 78  Ht 5\' 4"  (1.626 m)  Wt 270 lb (122.471 kg)  BMI 46.32 kg/m2 Well nourished, well developed, in no acute distressCrying at times HEENT: normal Neck: no JVD Cardiac:  normal S1, S2; irregularly irregular; no murmur Lungs:  clear to auscultation bilaterally, no wheezing, rhonchi or rales Abd: soft, nontender, no hepatomegalyobese Ext: no edemaOverweight excellent radial pulse Skin: warm and dry Neuro: no focal abnormalities noted  EKG:  EKG was ordered today 03/18/15-atrial fib heart rate 74 bpm, rightward axis. Much improved rate.  ECHO: 03/31/15: - Left ventricle: The cavity size was moderately dilated. Wall  thickness was increased in a pattern of mild LVH. Systolic  function was severely reduced. The estimated ejection fraction  was in the range of 25% to 30%. Diffuse hypokinesis. - Left atrium: The atrium was mildly dilated. - Atrial septum: No defect or patent foramen ovale was identified.  ASSESSMENT AND PLAN:  1. Paroxysmal atrial fibrillation - now appears fairly persistent. Thankfully, she is feeling much better with good rate control and good control of her blood pressure. We will go ahead and increase her Toprol to 100 mg once a day. Her ejection fraction was 25% in December 2016 and currently remains 25% 3 months later. Her  breathing is improved however. Because she is markedly improved from a symptom medic standpoint with rate controlled atrial fibrillation and because long-term successful rhythm control will be unlikely without antiarrhythmic (she used to be on amiodarone but does not wish to take this anymore), we will stop plans for cardioversion. Continue with rate control strategy.  2. Chronic systolic heart failure/cardiomegaly-we will however proceed with diagnostic coronary angiography, radial approach. She has high anxiety surrounding this. We discussed risks and benefits, stroke, MI, death, bleeding. Her daughter [name] was present for discussion from [place]. Willing to proceed. Given her current hemodynamic stability, I do not think that a right heart catheterization as absolutely needed but it would not be unreasonable to consider. If possible, perform via brachial vein approach. Gentle hydration because of underlying cardiomyopathy. 3. Morbid obesity-encourage weight loss. She has lost weight in the past. I encouraged decreasing carbohydrates. 4. Essential hypertension-Well controlled today.  5. Chronic anticoagulation with Coumadin-we are watching  this here in our clinic. She will need to hold her Coumadin prior to cardiac catheterization, 4 days. 6. Chronic systolic heart failure/cardio myopathy-as above. Increasing beta blocker. She is on ACE inhibitor. Lasix is currently 20 mg. Maintaining weight. Feeling much better. 7. We will see her back in one month post catheterization. Checking labs prior to cath.  Signed, [name], MD [facility]  04/15/2015 8:57 AM   Addendum: INR 1.7, coumadin held. Orders placed.  [name], MD

## 2015-04-16 NOTE — Addendum Note (Signed)
Addended by: Jerline Pain on: 04/16/2015 06:39 AM   Modules accepted: Orders

## 2015-04-17 ENCOUNTER — Encounter (HOSPITAL_COMMUNITY)
Admission: RE | Disposition: A | Discharge status: Home / Self Care | Payer: Self-pay | Source: Ambulatory Visit | Attending: Interventional Cardiology

## 2015-04-17 ENCOUNTER — Ambulatory Visit (HOSPITAL_COMMUNITY)
Admission: RE | Admit: 2015-04-17 | Discharge: 2015-04-19 | Disposition: A | Discharge status: Home / Self Care | Payer: Medicare Other | Source: Ambulatory Visit | Attending: Interventional Cardiology | Admitting: Interventional Cardiology

## 2015-04-17 DIAGNOSIS — E119 Type 2 diabetes mellitus without complications: Secondary | ICD-10-CM | POA: Diagnosis not present

## 2015-04-17 DIAGNOSIS — I110 Hypertensive heart disease with heart failure: Secondary | ICD-10-CM | POA: Insufficient documentation

## 2015-04-17 DIAGNOSIS — I481 Persistent atrial fibrillation: Secondary | ICD-10-CM | POA: Diagnosis not present

## 2015-04-17 DIAGNOSIS — I2510 Atherosclerotic heart disease of native coronary artery without angina pectoris: Secondary | ICD-10-CM | POA: Diagnosis not present

## 2015-04-17 DIAGNOSIS — Z6841 Body Mass Index (BMI) 40.0 and over, adult: Secondary | ICD-10-CM | POA: Diagnosis not present

## 2015-04-17 DIAGNOSIS — R262 Difficulty in walking, not elsewhere classified: Secondary | ICD-10-CM | POA: Insufficient documentation

## 2015-04-17 DIAGNOSIS — M311 Thrombotic microangiopathy: Secondary | ICD-10-CM | POA: Diagnosis not present

## 2015-04-17 DIAGNOSIS — Z79899 Other long term (current) drug therapy: Secondary | ICD-10-CM | POA: Insufficient documentation

## 2015-04-17 DIAGNOSIS — I255 Ischemic cardiomyopathy: Secondary | ICD-10-CM | POA: Diagnosis not present

## 2015-04-17 DIAGNOSIS — R531 Weakness: Secondary | ICD-10-CM | POA: Diagnosis not present

## 2015-04-17 DIAGNOSIS — Z90710 Acquired absence of both cervix and uterus: Secondary | ICD-10-CM | POA: Insufficient documentation

## 2015-04-17 DIAGNOSIS — I480 Paroxysmal atrial fibrillation: Secondary | ICD-10-CM | POA: Insufficient documentation

## 2015-04-17 DIAGNOSIS — Z880 Allergy status to penicillin: Secondary | ICD-10-CM | POA: Diagnosis not present

## 2015-04-17 DIAGNOSIS — Z7901 Long term (current) use of anticoagulants: Secondary | ICD-10-CM | POA: Insufficient documentation

## 2015-04-17 DIAGNOSIS — I5022 Chronic systolic (congestive) heart failure: Secondary | ICD-10-CM | POA: Insufficient documentation

## 2015-04-17 DIAGNOSIS — R609 Edema, unspecified: Secondary | ICD-10-CM

## 2015-04-17 DIAGNOSIS — M3119 Other thrombotic microangiopathy: Secondary | ICD-10-CM | POA: Diagnosis present

## 2015-04-17 DIAGNOSIS — I4819 Other persistent atrial fibrillation: Secondary | ICD-10-CM

## 2015-04-17 HISTORY — PX: CARDIAC CATHETERIZATION: SHX172

## 2015-04-17 LAB — GLUCOSE, CAPILLARY: Glucose-Capillary: 73 mg/dL (ref 65–99)

## 2015-04-17 LAB — PROTIME-INR
INR: 1.19 (ref 0.00–1.49)
Prothrombin Time: 15.2 seconds (ref 11.6–15.2)

## 2015-04-17 LAB — SURGICAL PCR SCREEN
MRSA, PCR: NEGATIVE
Staphylococcus aureus: NEGATIVE

## 2015-04-17 LAB — POCT ACTIVATED CLOTTING TIME: ACTIVATED CLOTTING TIME: 533 s

## 2015-04-17 SURGERY — LEFT HEART CATH AND CORONARY ANGIOGRAPHY

## 2015-04-17 MED ORDER — IOHEXOL 350 MG/ML SOLN
INTRAVENOUS | Status: DC | PRN
  Administered 2015-04-17: 150 mL via INTRAVENOUS

## 2015-04-17 MED ORDER — SODIUM CHLORIDE 0.9 % IV SOLN
INTRAVENOUS | Status: DC
  Administered 2015-04-17: 11:00:00 via INTRAVENOUS

## 2015-04-17 MED ORDER — BENAZEPRIL HCL 10 MG PO TABS
10.0000 mg | ORAL_TABLET | Freq: Every day | ORAL | Status: DC
  Administered 2015-04-17 – 2015-04-19 (×3): 10 mg via ORAL
  Filled 2015-04-17 (×4): qty 1

## 2015-04-17 MED ORDER — SODIUM CHLORIDE 0.9% FLUSH: 3.0000 mL | Freq: Two times a day (BID) | INTRAVENOUS | Status: DC

## 2015-04-17 MED ORDER — SODIUM CHLORIDE 0.9% FLUSH: 3.0000 mL | INTRAVENOUS | Status: DC | PRN

## 2015-04-17 MED ORDER — FENTANYL CITRATE (PF) 100 MCG/2ML IJ SOLN
INTRAMUSCULAR | Status: AC
  Filled 2015-04-17: qty 2

## 2015-04-17 MED ORDER — NITROGLYCERIN 0.4 MG SL SUBL: 0.4000 mg | SUBLINGUAL_TABLET | SUBLINGUAL | Status: DC | PRN

## 2015-04-17 MED ORDER — BIVALIRUDIN 250 MG IV SOLR
INTRAVENOUS | Status: AC
  Filled 2015-04-17: qty 250

## 2015-04-17 MED ORDER — MIDAZOLAM HCL 2 MG/2ML IJ SOLN
INTRAMUSCULAR | Status: DC | PRN
  Administered 2015-04-17: 2 mg via INTRAVENOUS
  Administered 2015-04-17: 1 mg via INTRAVENOUS
  Administered 2015-04-17: 0.5 mg via INTRAVENOUS

## 2015-04-17 MED ORDER — MIDAZOLAM HCL 2 MG/2ML IJ SOLN
INTRAMUSCULAR | Status: AC
  Filled 2015-04-17: qty 2

## 2015-04-17 MED ORDER — SODIUM CHLORIDE 0.9 % IV SOLN: 1.7500 mg/kg/h | INTRAVENOUS | Status: DC

## 2015-04-17 MED ORDER — MORPHINE SULFATE (PF) 2 MG/ML IV SOLN: 1.0000 mg | INTRAVENOUS | Status: DC | PRN

## 2015-04-17 MED ORDER — CLOPIDOGREL BISULFATE 75 MG PO TABS
75.0000 mg | ORAL_TABLET | Freq: Every day | ORAL | Status: DC
  Administered 2015-04-18 – 2015-04-19 (×2): 75 mg via ORAL
  Filled 2015-04-17 (×2): qty 1

## 2015-04-17 MED ORDER — HEPARIN SODIUM (PORCINE) 1000 UNIT/ML IJ SOLN
INTRAMUSCULAR | Status: DC | PRN
  Administered 2015-04-17: 6000 [IU] via INTRAVENOUS

## 2015-04-17 MED ORDER — NITROGLYCERIN 1 MG/10 ML FOR IR/CATH LAB
INTRA_ARTERIAL | Status: AC
  Filled 2015-04-17: qty 10

## 2015-04-17 MED ORDER — METOPROLOL SUCCINATE ER 100 MG PO TB24
100.0000 mg | ORAL_TABLET | Freq: Every day | ORAL | Status: DC
  Administered 2015-04-17 – 2015-04-19 (×3): 100 mg via ORAL
  Filled 2015-04-17 (×2): qty 2
  Filled 2015-04-17: qty 1

## 2015-04-17 MED ORDER — SODIUM CHLORIDE 0.9% FLUSH
3.0000 mL | Freq: Two times a day (BID) | INTRAVENOUS | Status: DC
  Administered 2015-04-17 – 2015-04-19 (×4): 3 mL via INTRAVENOUS

## 2015-04-17 MED ORDER — FUROSEMIDE 20 MG PO TABS
20.0000 mg | ORAL_TABLET | Freq: Every day | ORAL | Status: DC
  Administered 2015-04-18 – 2015-04-19 (×2): 20 mg via ORAL
  Filled 2015-04-17 (×2): qty 1

## 2015-04-17 MED ORDER — LIDOCAINE HCL (PF) 1 % IJ SOLN
INTRAMUSCULAR | Status: AC
  Filled 2015-04-17: qty 30

## 2015-04-17 MED ORDER — ISOSORBIDE MONONITRATE ER 30 MG PO TB24
30.0000 mg | ORAL_TABLET | Freq: Every day | ORAL | Status: DC
  Administered 2015-04-17: 17:00:00 30 mg via ORAL
  Filled 2015-04-17: qty 1

## 2015-04-17 MED ORDER — SODIUM CHLORIDE 0.9 % IV SOLN: 250.0000 mL | INTRAVENOUS | Status: DC | PRN

## 2015-04-17 MED ORDER — SODIUM CHLORIDE 0.9 % WEIGHT BASED INFUSION: 1.0000 mL/kg/h | INTRAVENOUS | Status: DC

## 2015-04-17 MED ORDER — ONDANSETRON HCL 4 MG/2ML IJ SOLN: 4.0000 mg | Freq: Four times a day (QID) | INTRAMUSCULAR | Status: DC | PRN

## 2015-04-17 MED ORDER — LORAZEPAM 0.5 MG PO TABS
0.5000 mg | ORAL_TABLET | Freq: Three times a day (TID) | ORAL | Status: DC
  Administered 2015-04-17 – 2015-04-18 (×3): 0.5 mg via ORAL
  Filled 2015-04-17 (×5): qty 1

## 2015-04-17 MED ORDER — SODIUM CHLORIDE 0.9 % IV SOLN
250.0000 mg | INTRAVENOUS | Status: DC | PRN
  Administered 2015-04-17 (×2): 1.75 mg/kg/h via INTRAVENOUS

## 2015-04-17 MED ORDER — ACETAMINOPHEN 325 MG PO TABS: 650.0000 mg | ORAL_TABLET | ORAL | Status: DC | PRN

## 2015-04-17 MED ORDER — LIDOCAINE HCL (PF) 1 % IJ SOLN
INTRAMUSCULAR | Status: DC | PRN
  Administered 2015-04-17: 3 mL

## 2015-04-17 MED ORDER — LEVOTHYROXINE SODIUM 50 MCG PO TABS
50.0000 ug | ORAL_TABLET | Freq: Every day | ORAL | Status: DC
  Administered 2015-04-18 – 2015-04-19 (×2): 50 ug via ORAL
  Filled 2015-04-17 (×2): qty 1

## 2015-04-17 MED ORDER — VERAPAMIL HCL 2.5 MG/ML IV SOLN
INTRAVENOUS | Status: AC
  Filled 2015-04-17: qty 2

## 2015-04-17 MED ORDER — CLOPIDOGREL BISULFATE 300 MG PO TABS
ORAL_TABLET | ORAL | Status: DC | PRN
  Administered 2015-04-17: 600 mg via ORAL

## 2015-04-17 MED ORDER — HEPARIN (PORCINE) IN NACL 2-0.9 UNIT/ML-% IJ SOLN
INTRAMUSCULAR | Status: DC | PRN
  Administered 2015-04-17: 1000 mL

## 2015-04-17 MED ORDER — WARFARIN SODIUM 5 MG PO TABS
5.0000 mg | ORAL_TABLET | Freq: Every day | ORAL | Status: DC
  Administered 2015-04-17 – 2015-04-18 (×2): 5 mg via ORAL
  Filled 2015-04-17 (×2): qty 1

## 2015-04-17 MED ORDER — BIVALIRUDIN BOLUS VIA INFUSION - CUPID
INTRAVENOUS | Status: DC | PRN
  Administered 2015-04-17: 91.875 mg via INTRAVENOUS

## 2015-04-17 MED ORDER — WARFARIN - PHYSICIAN DOSING INPATIENT
Freq: Every day | Status: DC
  Administered 2015-04-17: 19:00:00

## 2015-04-17 MED ORDER — FENTANYL CITRATE (PF) 100 MCG/2ML IJ SOLN
INTRAMUSCULAR | Status: DC | PRN
  Administered 2015-04-17: 25 ug via INTRAVENOUS
  Administered 2015-04-17: 50 ug via INTRAVENOUS
  Administered 2015-04-17 (×2): 25 ug via INTRAVENOUS

## 2015-04-17 MED ORDER — NITROGLYCERIN 1 MG/10 ML FOR IR/CATH LAB
INTRA_ARTERIAL | Status: DC | PRN
  Administered 2015-04-17: 200 ug via INTRA_ARTERIAL

## 2015-04-17 MED ORDER — HEPARIN SODIUM (PORCINE) 1000 UNIT/ML IJ SOLN
INTRAMUSCULAR | Status: AC
  Filled 2015-04-17: qty 1

## 2015-04-17 SURGICAL SUPPLY — 21 items
BALLN EUPHORA RX 3.0X15 (BALLOONS) ×4
BALLN ~~LOC~~ EUPHORA RX 3.75X8 (BALLOONS) ×4
BALLOON EUPHORA RX 3.0X15 (BALLOONS) IMPLANT
BALLOON ~~LOC~~ EUPHORA RX 3.75X8 (BALLOONS) IMPLANT
CATH INFINITI 5 FR JL3.5 (CATHETERS) ×2 IMPLANT
CATH INFINITI 5FR ANG PIGTAIL (CATHETERS) ×2 IMPLANT
CATH INFINITI JR4 5F (CATHETERS) ×2 IMPLANT
DEVICE RAD COMP TR BAND LRG (VASCULAR PRODUCTS) ×4 IMPLANT
GLIDESHEATH SLEND SS 6F .021 (SHEATH) ×4 IMPLANT
GUIDE CATH RUNWAY 6FR CLS3 (CATHETERS) ×2 IMPLANT
KIT ENCORE 26 ADVANTAGE (KITS) ×2 IMPLANT
KIT HEART LEFT (KITS) ×4 IMPLANT
PACK CARDIAC CATHETERIZATION (CUSTOM PROCEDURE TRAY) ×4 IMPLANT
STENT SYNERGY DES 3.5X16 (Permanent Stent) ×2 IMPLANT
SYR MEDRAD MARK V 150ML (SYRINGE) ×4 IMPLANT
TRANSDUCER W/STOPCOCK (MISCELLANEOUS) ×4 IMPLANT
TUBING CIL FLEX 10 FLL-RA (TUBING) ×4 IMPLANT
VALVE GUARDIAN II ~~LOC~~ HEMO (MISCELLANEOUS) ×2 IMPLANT
WIRE ASAHI PROWATER 180CM (WIRE) ×2 IMPLANT
WIRE HI TORQ BMW 190CM (WIRE) ×2 IMPLANT
WIRE SAFE-T 1.5MM-J .035X260CM (WIRE) ×2 IMPLANT

## 2015-04-17 NOTE — Interval H&P Note (Signed)
Cath Lab Visit (complete for each Cath Lab visit)  Clinical Evaluation Leading to the Procedure:   ACS: No.  Non-ACS:    Anginal Classification: CCS II  Anti-ischemic medical therapy: Minimal Therapy (1 class of medications)  Non-Invasive Test Results: High-risk stress test findings: cardiac mortality >3%/year  Prior CABG: No previous CABG      History and Physical Interval Note:  04/17/2015 1:40 PM  [name]  has presented today for surgery, with the diagnosis of hf  The various methods of treatment have been discussed with the patient and family. After consideration of risks, benefits and other options for treatment, the patient has consented to  Procedure(s): Left Heart Cath and Coronary Angiography (N/A) as a surgical intervention .  The patient's history has been reviewed, patient examined, no change in status, stable for surgery.  I have reviewed the patient's chart and labs.  Questions were answered to the patient's satisfaction.     Dayton Kenley S.

## 2015-04-17 NOTE — Progress Notes (Signed)
Margaret RN for Infection control was called. Pt does not need to be in precautions, PCR will be obtained to check status and will remove MRSA from chart if negative.

## 2015-04-17 NOTE — Progress Notes (Signed)
TR BAND REMOVAL  LOCATION:    right radial  DEFLATED PER PROTOCOL:    Yes.    TIME BAND OFF / DRESSING APPLIED:    2015   SITE UPON ARRIVAL:    Level 1(hematoma)  SITE AFTER BAND REMOVAL:    Level 1( bruised,soft)  CIRCULATION SENSATION AND MOVEMENT:    Within Normal Limits   Yes.    COMMENTS:received pt in unit with hematoma noted at site , manual  pressure held x10 min , hematoma resolved, site bruised but soft,.level 1

## 2015-04-17 NOTE — Progress Notes (Signed)
Upon arrival to unit pt noted to have swelling around top sides of TR band; cath lab staff that transported pt were not with patient. Supervisor called. Staff in case looked at band, and states it was not like that when they left the lab. Pressure held manually. Ace wrap placed. Arm continues to bruise above site; pulses are good. Level 1.

## 2015-04-17 NOTE — H&P (View-Only) (Signed)
[facility] [address], [address], [place]  [address] Phone: [phone] Fax:  [phone]  Date:  04/15/2015   ID:  [name], DOB [birth year], MRN [id number]  PCP:  [name]., MD   History of Present Illness: [name] is a 70 y.o. female with paroxysmal atrial fibrillation, previously normal EF now [id number], prior diastolic dysfunction as presentation of atrial fibrillation, diabetes, obesity here for follow up.   She was seen last on 01/05/15 by [place], [place]. Note reviewed, is found to be back in atrial fibrillation by orthopedic doctor. This was after she discontinued her amiodarone. She stopped all other cardiac medications as well. She had not been on Coumadin as well for over 6 months prior to this. She noted increasing dyspnea and fatigue. Blood pressure was quite high 170s. ECHO was performed on 01/06/15 which showed an ejection fraction of 25-30%. This may be related to uncontrolled hypertension as well as potentially A. fib with RVR.  Anticoagulation with novel agent was offered adamant no. She agreed to start Coumadin again. She also did not wish to restart amiodarone. It is likely that rate control strategy will continue.   Weight has been 271 pounds. Husband has cancer of bladder.   Overall she is feeling much improved, less shortness of breath, no chest pain, no bleeding, no syncope. See below.   She's had prior episodes of atrial fibrillation that are brief, lasting approximately one hour that are relieved with rest with . Felt SOB during. Mild dizzy. Nausea.   October 2012 echocardiogram showed normal EF, borderline left atrial enlargement, trivial valvular lesions. Overall reassuring. Most recent echocardiogram December 2016 showed EF of 25%.   04/15/15-she is currently crying at times with anxiety surrounding her husband who has bladder cancer. Her daughter [name] from [place] is here today. We discussed the findings of the repeat echocardiogram  after adequate rate control of her atrial fibrillation and her ejection fraction remains low in the 25% range. Previously several years ago, her EF was normal. She does overall report that she is feeling better with current medical therapy. She is never had a [name] catheterization. She has high anxiety.   Note, she had prior history of TTP. No further sequela currently.    Wt Readings from Last 3 Encounters:  04/15/15 270 lb (122.471 kg)  03/18/15 268 lb 12.8 oz (121.927 kg)  01/13/15 260 lb 3.2 oz (118.026 kg)     Past Medical History  Diagnosis Date  . Thrombocytopenia ([place])   . Hypertension   . 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 Atrial fibrillation status post cardioversion ([place]) 08/01/2011  . Benign essential HTN 08/01/2011  . Morbid obesity ([place]) 01/07/2013    Past Surgical History  Procedure Laterality Date  . Abdominal hysterectomy    . Appendectomy    . Abdominal surgery    . Splenectomy, total    . Tonsillectomy    . Back surgery      Current Outpatient Prescriptions  Medication Sig Dispense Refill  . benazepril (LOTENSIN) 10 MG tablet Take 1 tablet (10 mg total) by mouth daily. 30 tablet 11  . furosemide (LASIX) 20 MG tablet Take 20 mg by mouth daily.    . isosorbide mononitrate (IMDUR) 30 MG 24 hr tablet Take 1 tablet (30 mg total) by mouth daily. 30 tablet 11  . levothyroxine (SYNTHROID, LEVOTHROID) 50 MCG tablet Take 50 mcg by mouth daily before breakfast.    . metoprolol  succinate (TOPROL-XL) 100 MG 24 hr tablet Take 1 tablet (100 mg total) by mouth daily. Take with or immediately following a meal. 30 tablet 6  . nitroGLYCERIN (NITROSTAT) 0.4 MG SL tablet Place 1 tablet (0.4 mg total) under the tongue every 5 (five) minutes as needed for chest pain. 25 tablet 3  . warfarin (COUMADIN) 5 MG tablet Take 1 tablet (5 mg total) by mouth daily. 30 tablet 11   No current facility-administered medications for this visit.    Allergies:    Allergies   Allergen Reactions  . Ceftriaxone Rash    ttp  . Levofloxacin Other (See Comments)    Dropped blood platelets extremely low  . Aspirin Other (See Comments)    Patient stated she is unable to take due to blood thinner  . Oxycodone-Acetaminophen Itching  . Penicillins Rash    Pt reports anything that ends with an in    Social History:  The patient  reports that she has never smoked. She does not have any smokeless tobacco history on file. She reports that she does not drink alcohol or use illicit drugs.   ROS:  Please see the history of present illness.   Occasional back pain. No palpitations.  PHYSICAL EXAM: VS:  BP 130/76 mmHg  Pulse 78  Ht 5\' 4"  (1.626 m)  Wt 270 lb (122.471 kg)  BMI 46.32 kg/m2 Well nourished, well developed, in no acute distressCrying at times HEENT: normal Neck: no JVD Cardiac:  normal S1, S2; irregularly irregular; no murmur Lungs:  clear to auscultation bilaterally, no wheezing, rhonchi or rales Abd: soft, nontender, no hepatomegalyobese Ext: no edemaOverweight excellent radial pulse Skin: warm and dry Neuro: no focal abnormalities noted  EKG:  EKG was ordered today 03/18/15-atrial fib [name] rate 74 bpm, rightward axis. Much improved rate.  ECHO: 03/31/15: - Left ventricle: The cavity size was moderately dilated. Wall  thickness was increased in a pattern of mild LVH. Systolic  function was severely reduced. The estimated ejection fraction  was in the range of 25% to 30%. Diffuse hypokinesis. - Left atrium: The atrium was mildly dilated. - Atrial septum: No defect or patent foramen ovale was identified.  ASSESSMENT AND PLAN:  1. Paroxysmal atrial fibrillation - now appears fairly persistent. Thankfully, she is feeling much better with good rate control and good control of her blood pressure. We will go ahead and increase her Toprol to 100 mg once a day. Her ejection fraction was 25% in December 2016 and currently remains 25% 3 months later. Her  breathing is improved however. Because she is markedly improved from a symptom medic standpoint with rate controlled atrial fibrillation and because long-term successful rhythm control will be unlikely without antiarrhythmic (she used to be on amiodarone but does not wish to take this anymore), we will stop plans for cardioversion. Continue with rate control strategy.  2. Chronic systolic [name] failure/cardiomegaly-we will however proceed with diagnostic coronary angiography, radial approach. She has high anxiety surrounding this. We discussed risks and benefits, stroke, MI, death, bleeding. Her daughter [name] was present for discussion from [place]. Willing to proceed. Given her current hemodynamic stability, I do not think that a right [name] catheterization as absolutely needed but it would not be unreasonable to consider. If possible, perform via brachial vein approach. Gentle hydration because of underlying cardiomyopathy. 3. Morbid obesity-encourage weight loss. She has lost weight in the past. I encouraged decreasing carbohydrates. 4. Essential hypertension-Well controlled today.  5. Chronic anticoagulation with Coumadin-we are watching  this here in our clinic. She will need to hold her Coumadin prior to cardiac catheterization, 4 days. 6. Chronic systolic [name] failure/cardio myopathy-as above. Increasing beta blocker. She is on ACE inhibitor. Lasix is currently 20 mg. Maintaining weight. Feeling much better. 7. We will see her back in one month post catheterization. Checking labs prior to cath.  Signed, [name], MD [facility]  04/15/2015 8:57 AM   Addendum: INR 1.7, coumadin held. Orders placed.  [name], MD

## 2015-04-18 ENCOUNTER — Encounter (HOSPITAL_COMMUNITY): Payer: Self-pay | Admitting: Interventional Cardiology

## 2015-04-18 DIAGNOSIS — I255 Ischemic cardiomyopathy: Secondary | ICD-10-CM

## 2015-04-18 DIAGNOSIS — I110 Hypertensive heart disease with heart failure: Secondary | ICD-10-CM | POA: Diagnosis not present

## 2015-04-18 DIAGNOSIS — I5022 Chronic systolic (congestive) heart failure: Secondary | ICD-10-CM | POA: Diagnosis not present

## 2015-04-18 DIAGNOSIS — I4819 Other persistent atrial fibrillation: Secondary | ICD-10-CM

## 2015-04-18 DIAGNOSIS — I2510 Atherosclerotic heart disease of native coronary artery without angina pectoris: Secondary | ICD-10-CM

## 2015-04-18 DIAGNOSIS — I481 Persistent atrial fibrillation: Secondary | ICD-10-CM

## 2015-04-18 DIAGNOSIS — I480 Paroxysmal atrial fibrillation: Secondary | ICD-10-CM

## 2015-04-18 LAB — PROTIME-INR
INR: 1.21 (ref 0.00–1.49)
PROTHROMBIN TIME: 15.4 s — AB (ref 11.6–15.2)

## 2015-04-18 LAB — CBC
HCT: 40.8 % (ref 36.0–46.0)
HEMOGLOBIN: 13.4 g/dL (ref 12.0–15.0)
MCH: 29.3 pg (ref 26.0–34.0)
MCHC: 32.8 g/dL (ref 30.0–36.0)
MCV: 89.3 fL (ref 78.0–100.0)
PLATELETS: 404 10*3/uL — AB (ref 150–400)
RBC: 4.57 MIL/uL (ref 3.87–5.11)
RDW: 16 % — ABNORMAL HIGH (ref 11.5–15.5)
WBC: 9.6 10*3/uL (ref 4.0–10.5)

## 2015-04-18 LAB — BASIC METABOLIC PANEL
ANION GAP: 7 (ref 5–15)
BUN: 14 mg/dL (ref 6–20)
CALCIUM: 8 mg/dL — AB (ref 8.9–10.3)
CHLORIDE: 107 mmol/L (ref 101–111)
CO2: 27 mmol/L (ref 22–32)
CREATININE: 1.12 mg/dL — AB (ref 0.44–1.00)
GFR calc non Af Amer: 49 mL/min — ABNORMAL LOW (ref >60)
GFR, EST AFRICAN AMERICAN: 57 mL/min — AB (ref >60)
Glucose, Bld: 101 mg/dL — ABNORMAL HIGH (ref 65–99)
Potassium: 4.1 mmol/L (ref 3.5–5.1)
SODIUM: 141 mmol/L (ref 135–145)

## 2015-04-18 MED ORDER — ANGIOPLASTY BOOK
Freq: Once | Status: AC
  Administered 2015-04-18: 01:00:00
  Filled 2015-04-18: qty 1

## 2015-04-18 MED ORDER — ASPIRIN 81 MG PO CHEW
81.0000 mg | CHEWABLE_TABLET | Freq: Every day | ORAL | Status: DC
  Administered 2015-04-18 – 2015-04-19 (×2): 81 mg via ORAL
  Filled 2015-04-18 (×2): qty 1

## 2015-04-18 MED ORDER — ATORVASTATIN CALCIUM 80 MG PO TABS: 80.0000 mg | ORAL_TABLET | Freq: Every day | ORAL | Status: DC

## 2015-04-18 MED ORDER — ASPIRIN 81 MG PO CHEW: 81.0000 mg | CHEWABLE_TABLET | Freq: Every day | ORAL | Status: DC

## 2015-04-18 MED ORDER — CLOPIDOGREL BISULFATE 75 MG PO TABS: 75.0000 mg | ORAL_TABLET | Freq: Every day | ORAL | Status: DC

## 2015-04-18 MED ORDER — ATORVASTATIN CALCIUM 80 MG PO TABS
80.0000 mg | ORAL_TABLET | Freq: Every day | ORAL | Status: DC
  Administered 2015-04-18: 80 mg via ORAL
  Filled 2015-04-18: qty 1
  Filled 2015-04-18: qty 2

## 2015-04-18 NOTE — Progress Notes (Addendum)
CARDIAC REHAB PHASE II PRE:  Rate/Rhythm: 67 Afib  BP:   Sitting: 144/85     SaO2: 94% RA  MODE:  Ambulation: 336 ft   POST:  Rate/Rhythm: 95 AFib  BP:   Sitting: 127/61     SaO2: 95% RA  [id number] Pt ambulated 336 ft with one person assist, gait belt and later a wheel chair for assistance. Asked pt at start does she use a walker at home, she reported no. Pt felt dizzy and flush half way into walk. Had pt sit to rest for 3 minutes while waiting on wheel chair. Pt used wheel chair for balance and pushed it back to room. Returned pt to recliner with LE elevated. Pt felt clammy. Pt BP was 127/61. Sipped water and rested. Pt felt better with rest. Educated pt on importance of Plavix,ASA, coumadin, and statin. Pt stated that she will be better and more compliant with medical management. Discussed stent card, risks factors, restrictions/limitation,exercise guidelines, HH diet, and weight management.stress management and weighting daily. Pt has anxiety about her husband who has bladder cancer. Pt was tearful in discussion and do not know how to handle both her and her husbands health concerns. Pt expressed interest in home health. Referred to nurse. Pt declined CRPII due to taking care of husband. Left handouts and brochure for CRPII.   Ellery Meroney D Lundynn Cohoon,MS,ACSM-RCEP 04/18/2015 9:21 AM

## 2015-04-18 NOTE — Discharge Summary (Addendum)
Discharge Summary    Patient ID: [name],  MRN: [id number], DOB/AGE: 08/01/1945 70 y.o.  Admit date: 04/17/2015 Discharge date: 04/19/2015  Primary Care Provider: Robyne Peers. Primary Cardiologist: M. Gillian Shields, MD   Discharge Diagnoses    Principal Problem:   CAD (coronary artery disease)  **S/P PCI/DES to the LAD this admission.  Active Problems:   Cardiomyopathy, ischemic   Hypertensive heart disease with heart failure (HCC)   Persistent atrial fibrillation (HCC)   Chronic systolic (congestive) heart failure (HCC)   TTP (thrombotic thrombocytopenic purpura) (HCC)   Chronic anticoagulation  Allergies Allergies  Allergen Reactions  . Ceftriaxone Rash    ttp  . Levofloxacin Other (See Comments)    Dropped blood platelets extremely low  . Aspirin Other (See Comments)    Patient stated she is unable to take due to blood thinner  . Oxycodone-Acetaminophen Itching  . Penicillins Rash    Pt reports anything that ends with an in    Diagnostic Studies/Procedures    Cardiac Catheterization and Percutaneous Coronary Intervention 3.17.2017  Coronary Findings     Dominance: Right    Left Anterior Descending   . Mid LAD lesion, 80% stenosed. The lesion is type C Discrete located at the major branch.   . PCI: The mid LAD was successfully stented using a 3.5 x 16 mm Synergy DES.  Marland Kitchen There is no residual stenosis post intervention.     . First Diagonal Branch   . 1st Diag lesion, 50% stenosed.      Right Coronary Artery  The vessel exhibits minimal luminal irregularities.   . Prox RCA lesion, 30% stenosed.     Left Ventricle The left ventricle is enlarged. There is moderate left ventricular systolic dysfunction. The left ventricular ejection fraction is 25-35% by visual estimate. There are wall motion abnormalities in the left ventricle. There is global hypokinesis of the left ventricle.   Aortic Valve There is no aortic valve  stenosis. _____________  Right Upper Extremity Venous Duplex 3.19.2017  There is no DVT or SVT noted in the right upper extremity. There is no evidence of pseudoaneurysm or AV fistula at cath site.  _____________   History of Present Illness     70 year old female with a prior history of paroxysmal atrial fibrillation, morbid obesity, and hypertension. A. fib was previously managed with amiodarone but in late 2016, she discontinued this on her own and when she was seen in urology clinic in December 2016, she was found to be back in atrial fibrillation. She was reluctant to go back on amiodarone and instead was placed on beta blocker therapy. She was also placed on Coumadin. An echocardiogram was performed and showed an EF of 25-30%. This was a new finding and decision was made to follow-up an echo in the future once her A. fib and hypertension were better controlled. Follow-up echo in February 2017 showed persistent LV dysfunction with an EF of 25-30%. She had not been having chest pain or dyspnea. In light of ongoing LV dysfunction, decision was made to pursue diagnostic catheterization.  Hospital Course     Consultants: None   Patient presented to the [facility] cardiac catheterization laboratory on 04/17/2015, and underwent diagnostic catheterization revealing severe mid LAD disease with otherwise nonobstructive diagonal and proximal right coronary artery disease. EF was 25-35%. The LAD was successfully stented using a 3.5 x 16 mm Synergy drug-eluting stent. Patient tolerated procedure well and post procedure has been ablated without symptoms or  limitations. She will remain on ASA, Plavix, and warfarin for one month at which time ASA can be discontinued and she will then remain on Plavix and coumadin.  We have also added high potency statin therapy.  She has been seen by cardiac rehabilitation and outpatient referral has been made. She was due to be discharged on 3/18 however she c/o feeling weak  and so d/c was delayed.  She also c/o right wrist discomfort at the site of her catheterization.  The site has been ecchymotic however it has been without bleeding, bruit, or hematoma.  An ultrasound was performed this morning and is negative for pseudoaneurysm or AV fistula.  She has been feeling much stronger and is ready for discharge today.  Home health physical therapy has been ordered at the request of inpatient physical therapy. _____________  Discharge Vitals Blood pressure 135/67, pulse 69, temperature 98 F (36.7 C), temperature source Oral, resp. rate 19, height 5\' 4"  (1.626 m), weight 270 lb 12.8 oz (122.834 kg), SpO2 97 %.  Filed Weights   04/17/15 1020 04/18/15 0430 04/19/15 0533  Weight: 270 lb (122.471 kg) 277 lb 1.9 oz (125.7 kg) 270 lb 12.8 oz (122.834 kg)    Labs & Radiologic Studies    CBC  Recent Labs  04/18/15 0247  WBC 9.6  HGB 13.4  HCT 40.8  MCV 89.3  PLT [id number]*   Basic Metabolic Panel  Recent Labs  04/18/15 0247  NA 141  K 4.1  CL 107  CO2 27  GLUCOSE 101*  BUN 14  CREATININE 1.12*  CALCIUM 8.0*    Disposition   Pt is being discharged home today in good condition.  Follow-up Plans & Appointments    Follow-up Information    Follow up with [name], MD On 05/07/2015.   Specialty:  Cardiology   Why:  9:00 AM   Contact information:   [id number] [place] [address] [phone]       Follow up with [facility] On 05/06/2015.   Why:  10:15 AM - Coumadin Clinic   Contact information:   [place] [phone]     Discharge Instructions    Amb Referral to Cardiac Rehabilitation    Complete by:  As directed   Diagnosis:   PCI Heart Failure (see criteria below)    Heart Failure Type:  Chronic Systolic     Amb Referral to Cardiac Rehabilitation    Complete by:  As directed   Diagnosis:   Heart Failure (see  criteria below) PCI    Heart Failure Type:  Chronic Systolic          Discharge Medications   Current Discharge Medication List    START taking these medications   Details  aspirin 81 MG chewable tablet Chew 1 tablet (81 mg total) by mouth daily.    atorvastatin (LIPITOR) 80 MG tablet Take 1 tablet (80 mg total) by mouth daily at 6 PM. Qty: 30 tablet, Refills: 6    clopidogrel (PLAVIX) 75 MG tablet Take 1 tablet (75 mg total) by mouth daily with breakfast. Qty: 30 tablet, Refills: 6      CONTINUE these medications which have NOT CHANGED   Details  benazepril (LOTENSIN) 10 MG tablet Take 1 tablet (10 mg total) by mouth daily. Qty: 30 tablet, Refills: 11    furosemide (LASIX) 20 MG tablet Take 20 mg by mouth daily.  levothyroxine (SYNTHROID, LEVOTHROID) 50 MCG tablet Take 50 mcg by mouth daily before breakfast.    LORazepam (ATIVAN) 0.5 MG tablet Take 0.5 mg by mouth every 8 (eight) hours.    metoprolol succinate (TOPROL-XL) 100 MG 24 hr tablet Take 1 tablet (100 mg total) by mouth daily. Take with or immediately following a meal. Qty: 30 tablet, Refills: 6   Associated Diagnoses: Persistent atrial fibrillation (HCC)    warfarin (COUMADIN) 5 MG tablet Take 1 tablet (5 mg total) by mouth daily. Qty: 30 tablet, Refills: 11    nitroGLYCERIN (NITROSTAT) 0.4 MG SL tablet Place 1 tablet (0.4 mg total) under the tongue every 5 (five) minutes as needed for chest pain. Qty: 25 tablet, Refills: 3      STOP taking these medications     isosorbide mononitrate (IMDUR) 30 MG 24 hr tablet          Outstanding Labs/Studies   Follow-up CBC @ f/u visit to assess platelets. Follow-up Lipids/LFT's in 8 wks. Follow-up INR as scheudled.  Duration of Discharge Encounter   Greater than 30 minutes including physician time.  Signed, Murray Hodgkins NP 04/19/2015, 9:23 AM See progress note from 3/19. Kirk Ruths

## 2015-04-18 NOTE — Progress Notes (Signed)
Called report to Isabela, RN on 3W, patient transferred with all belongings.

## 2015-04-18 NOTE — Progress Notes (Signed)
Patient Name: [name] Date of Encounter: 04/18/2015  Hospital Problem List     Principal Problem:   CAD (coronary artery disease) Active Problems:   Cardiomyopathy, ischemic   Hypertensive heart disease with heart failure (HCC)   Persistent atrial fibrillation (HCC)   Chronic systolic (congestive) heart failure (HCC)   TTP (thrombotic thrombocytopenic purpura) (HCC)   Chronic anticoagulation    Subjective   No c/p or sob overnight.  R wrist bruised and mildly tender.  Eager to go home.  Inpatient Medications    . benazepril  10 mg Oral Daily  . clopidogrel  75 mg Oral Q breakfast  . furosemide  20 mg Oral Daily  . isosorbide mononitrate  30 mg Oral Daily  . levothyroxine  50 mcg Oral QAC breakfast  . LORazepam  0.5 mg Oral 3 times per day  . metoprolol succinate  100 mg Oral Daily  . sodium chloride flush  3 mL Intravenous Q12H  . warfarin  5 mg Oral q1800  . Warfarin - Physician Dosing Inpatient   Does not apply q1800    Vital Signs    Filed Vitals:   04/17/15 2100 04/17/15 2200 04/18/15 0430 04/18/15 0739  BP: 138/80 122/56 135/73 138/70  Pulse:   74 67  Temp:   98.2 F (36.8 C) 98.1 F (36.7 C)  TempSrc:   Oral Oral  Resp: 17 18 19 18   Height:      Weight:   277 lb 1.9 oz (125.7 kg)   SpO2:   98% 98%    Intake/Output Summary (Last 24 hours) at 04/18/15 0804 Last data filed at 04/18/15 0107  Gross per 24 hour  Intake 1268.33 ml  Output   1050 ml  Net 218.33 ml   Filed Weights   04/17/15 1020 04/18/15 0430  Weight: 270 lb (122.471 kg) 277 lb 1.9 oz (125.7 kg)    Physical Exam    General: Pleasant, NAD. Neuro: Alert and oriented X 3. Moves all extremities spontaneously. Psych: Normal affect. HEENT:  Normal  Neck: Supple, obese, no bruits.  Difficult to gauge jvp 2/2 girth. Lungs:  Resp regular and unlabored, CTA. Heart: IR, IR no s3, s4, or murmurs. Abdomen: Soft, obese, non-tender, non-distended, BS + x 4.  Extremities: No  clubbing, cyanosis or edema. DP/PT/Radials 2+ and equal bilaterally.  R wrist cath site ecchymotic and mildly tender.  No bleeding/bruit/hematoma.  Labs    CBC  Recent Labs  04/15/15 0954 04/18/15 0247  WBC 10.0 9.6  HGB 16.0* 13.4  HCT 47.7* 40.8  MCV 89.5 89.3  PLT 547* [id number]*   Basic Metabolic Panel  Recent Labs  04/15/15 0954 04/18/15 0247  NA 141 141  K 4.3 4.1  CL 103 107  CO2 25 27  GLUCOSE 100* 101*  BUN 15 14  CREATININE 1.12* 1.12*  CALCIUM 8.7 8.0*   Lab Results  Component Value Date   INR 1.21 04/18/2015   INR 1.19 04/17/2015   INR 1.7 04/15/2015    Telemetry    AF 70's.  ECG    AF, 77, RAD, LPFB, poor R progression/prior ant infarct  Radiology    No results found.  Assessment & Plan    1.  CAD:  S/P cath yesterday revealing severe LAD dzs.  Now s/p PCI.  Residual, non-obs Diag and RCA dzs.  No c/p or dyspnea.  Ambulating w/o difficutly.  Cont plavix in addition to coumadin.  Inv cardiology recs plavix x at least  six mos.  Pt has refused ASA.  Cont  blocker, acei.  Add statin.  F/u platelets @ f/u appt given h/o TTP.  2.  ICM/Chronic Systolic CHF:  Euvolemic on exam.  EDP 12 mmHg yesterday.  Cont  blocker, acei.  F/u echo in 3 mos post-pci and consider EP eval @ that time.  3.  Persistent AFib: rate controled on  blocker.  INR 1.2 (CHA2DS2VASc =  5).  Cont coumadin.  4.  Hypertensive Heart Disease:  Stable on  blocker, acei, nitrate.  5.  HL:  Not previously on statin.  In setting of LAD dzs with residual diag and RCA dzs, will add high potency statin.  She will need f/u lipids/lft's in 8 wks.  6.  Morbid Obesity:  Would really benefit from cardiac rehab and aggressive outpt nutritional counseling.  Signed, Murray Hodgkins NP As above, patient seen and examined. Patient denies chest pain or dyspnea. She is now status post PCI of LAD. Add aspirin 81 mg daily and continue Plavix and statin. Discontinue aspirin after one month of therapy  and continue Plavix/Coumadin long-term. Continue beta blocker. She remains in atrial fibrillation and rate controlled. Continue Coumadin. Continue ACE inhibitor and beta blocker for cardiomyopathy. Discontinue nitrates. Advanced ACE inhibitor as an outpatient as tolerated by blood pressure.She will need a repeat echocardiogram 3 months after medications titrated. Patient complains of weakness and states she is not ready for discharge. We will plan discharge tomorrow. If stable and ambulating without symptoms. Kirk Ruths

## 2015-04-19 ENCOUNTER — Ambulatory Visit (HOSPITAL_BASED_OUTPATIENT_CLINIC_OR_DEPARTMENT_OTHER): Payer: Medicare Other

## 2015-04-19 DIAGNOSIS — R609 Edema, unspecified: Secondary | ICD-10-CM

## 2015-04-19 DIAGNOSIS — I2510 Atherosclerotic heart disease of native coronary artery without angina pectoris: Secondary | ICD-10-CM | POA: Diagnosis not present

## 2015-04-19 DIAGNOSIS — I110 Hypertensive heart disease with heart failure: Secondary | ICD-10-CM | POA: Diagnosis not present

## 2015-04-19 LAB — PROTIME-INR
INR: 1.1 (ref 0.00–1.49)
PROTHROMBIN TIME: 14.3 s (ref 11.6–15.2)

## 2015-04-19 NOTE — Care Management Note (Signed)
Case Management Note  Patient Details  Name: SAFARI CINQUE MRN: [id number] Date of Birth: 09-15-1945  Subjective/Objective:                  atrial fibrillation, diabetes, and obesity Action/Plan: Discharge planning Expected Discharge Date:  04/19/15               Expected Discharge Plan:  [place]  In-House Referral:     Discharge planning Services  CM Consult  Post Acute Care Choice:  Home Health Choice offered to:  Patient  DME Arranged:  N/A DME Agency:  NA  HH Arranged:  PT [place] Agency:  [place]  Status of Service:  Completed, signed off  Medicare Important Message Given:    Date Medicare IM Given:    Medicare IM give by:    Date Additional Medicare IM Given:    Additional Medicare Important Message give by:     If discussed at [place] of Stay Meetings, dates discussed:    Additional Comments: CM met with pt in room to offer choice of home health agency.  Pt chooses to render HHPT.  Referral called to [facility] rep, Tiffany.  Pt states she does not need/want any DME.  No other CM needs were communicated. Dellie Catholic, RN 04/19/2015, 11:36 AM

## 2015-04-19 NOTE — Progress Notes (Signed)
VASCULAR LAB PRELIMINARY  PRELIMINARY  PRELIMINARY  PRELIMINARY  Right upper extremity venous duplex completed.    Preliminary report:  There is no DVT or SVT noted in the right upper extremity.  There is no evidence of pseudoaneurysm or AV fistula at cath site.   Christell Steinmiller, RVT 04/19/2015, 9:36 AM

## 2015-04-19 NOTE — Discharge Instructions (Signed)
**PLEASE REMEMBER TO BRING ALL OF YOUR MEDICATIONS TO EACH OF YOUR FOLLOW-UP OFFICE VISITS.  NO HEAVY LIFTING OR SEXUAL ACTIVITY X 7 DAYS. NO DRIVING X 3-5 DAYS. NO SOAKING BATHS, HOT TUBS, POOLS, ETC., X 7 DAYS.  Radial Site Care Refer to this sheet in the next few weeks. These instructions provide you with information on caring for yourself after your procedure. Your caregiver may also give you more specific instructions. Your treatment has been planned according to current medical practices, but problems sometimes occur. Call your caregiver if you have any problems or questions after your procedure. HOME CARE INSTRUCTIONS  You may shower the day after the procedure.Remove the bandage (dressing) and gently wash the site with plain soap and water.Gently pat the site dry.   Do not apply powder or lotion to the site.   Do not submerge the affected site in water for 3 to 5 days.   Inspect the site at least twice daily.   Do not flex or bend the affected arm for 24 hours.   No lifting over 5 pounds (2.3 kg) for 5 days after your procedure.   Do not drive home if you are discharged the same day of the procedure. Have someone else drive you.   What to expect:  Any bruising will usually fade within 1 to 2 weeks.   Blood that collects in the tissue (hematoma) may be painful to the touch. It should usually decrease in size and tenderness within 1 to 2 weeks.  SEEK IMMEDIATE MEDICAL CARE IF:  You have unusual pain at the radial site.   You have redness, warmth, swelling, or pain at the radial site.   You have drainage (other than a small amount of blood on the dressing).   You have chills.   You have a fever or persistent symptoms for more than 72 hours.   You have a fever and your symptoms suddenly get worse.   Your arm becomes pale, cool, tingly, or numb.   You have heavy bleeding from the site. Hold pressure on the site.   Heart-Healthy Eating Plan Many factors influence  your heart health, including eating and exercise habits. Heart (coronary) risk increases with abnormal blood fat (lipid) levels. Heart-healthy meal planning includes limiting unhealthy fats, increasing healthy fats, and making other small dietary changes. This includes maintaining a healthy body weight to help keep lipid levels within a normal range. WHAT IS MY PLAN?  Your health care provider recommends that you:  Get no more than _________% of the total calories in your daily diet from fat.  Limit your intake of saturated fat to less than _________% of your total calories each day.  Limit the amount of cholesterol in your diet to less than _________ mg per day. WHAT TYPES OF FAT SHOULD I CHOOSE?  Choose healthy fats more often. Choose monounsaturated and polyunsaturated fats, such as olive oil and canola oil, flaxseeds, walnuts, almonds, and seeds.  Eat more omega-3 fats. Good choices include salmon, mackerel, sardines, tuna, flaxseed oil, and ground flaxseeds. Aim to eat fish at least two times each week.  Limit saturated fats. Saturated fats are primarily found in animal products, such as meats, butter, and cream. Plant sources of saturated fats include palm oil, palm kernel oil, and coconut oil.  Avoid foods with partially hydrogenated oils in them. These contain trans fats. Examples of foods that contain trans fats are stick margarine, some tub margarines, cookies, crackers, and other baked goods. WHAT GENERAL  GUIDELINES DO I NEED TO FOLLOW?  Check food labels carefully to identify foods with trans fats or high amounts of saturated fat.  Fill one half of your plate with vegetables and green salads. Eat 4-5 servings of vegetables per day. A serving of vegetables equals 1 cup of raw leafy vegetables,  cup of raw or cooked cut-up vegetables, or  cup of vegetable juice.  Fill one fourth of your plate with whole grains. Look for the word "whole" as the first word in the ingredient  list.  Fill one fourth of your plate with lean protein foods.  Eat 4-5 servings of fruit per day. A serving of fruit equals one medium whole fruit,  cup of dried fruit,  cup of fresh, frozen, or canned fruit, or  cup of 100% fruit juice.  Eat more foods that contain soluble fiber. Examples of foods that contain this type of fiber are apples, broccoli, carrots, beans, peas, and barley. Aim to get 20-30 g of fiber per day.  Eat more home-cooked food and less restaurant, buffet, and fast food.  Limit or avoid alcohol.  Limit foods that are high in starch and sugar.  Avoid fried foods.  Cook foods by using methods other than frying. Baking, boiling, grilling, and broiling are all great options. Other fat-reducing suggestions include:  Removing the skin from poultry.  Removing all visible fats from meats.  Skimming the fat off of stews, soups, and gravies before serving them.  Steaming vegetables in water or broth.  Lose weight if you are overweight. Losing just 5-10% of your initial body weight can help your overall health and prevent diseases such as diabetes and heart disease.  Increase your consumption of nuts, legumes, and seeds to 4-5 servings per week. One serving of dried beans or legumes equals  cup after being cooked, one serving of nuts equals 1 ounces, and one serving of seeds equals  ounce or 1 tablespoon.  You may need to monitor your salt (sodium) intake, especially if you have high blood pressure. Talk with your health care provider or dietitian to get more information about reducing sodium. WHAT FOODS CAN I EAT? Grains Breads, including [place], white, pita, wheat, raisin, rye, oatmeal, and [place]. Tortillas that are neither fried nor made with lard or trans fat. Low-fat rolls, including hotdog and hamburger buns and English muffins. Biscuits. Muffins. Waffles. Pancakes. Light popcorn. Whole-grain cereals. Flatbread. Melba toast. Pretzels. Breadsticks. Rusks.  Low-fat snacks and crackers, including oyster, saltine, matzo, graham, animal, and rye. Rice and pasta, including brown rice and those that are made with whole wheat. Vegetables All vegetables. Fruits All fruits, but limit coconut. Meats and Other Protein Sources Lean, well-trimmed beef, veal, pork, and lamb. Chicken and [place] without skin. All fish and shellfish. Wild duck, rabbit, pheasant, and venison. Egg whites or low-cholesterol egg substitutes. Dried beans, peas, lentils, and tofu.Seeds and most nuts. Dairy Low-fat or nonfat cheeses, including ricotta, string, and mozzarella. Skim or 1% milk that is liquid, powdered, or evaporated. Buttermilk that is made with low-fat milk. Nonfat or low-fat yogurt. Beverages Mineral water. Diet carbonated beverages. Sweets and Desserts Sherbets and fruit ices. Honey, jam, marmalade, jelly, and syrups. Meringues and gelatins. Pure sugar candy, such as hard candy, jelly beans, gumdrops, mints, marshmallows, and small amounts of dark chocolate. [employer] Eat all sweets and desserts in moderation. Fats and Oils Nonhydrogenated (trans-free) margarines. Vegetable oils, including soybean, sesame, sunflower, olive, peanut, safflower, corn, canola, and cottonseed. Salad dressings or mayonnaise  that are made with a vegetable oil. Limit added fats and oils that you use for cooking, baking, salads, and as spreads. Other Cocoa powder. Coffee and tea. All seasonings and condiments. The items listed above may not be a complete list of recommended foods or beverages. Contact your dietitian for more options. WHAT FOODS ARE NOT RECOMMENDED? Grains Breads that are made with saturated or trans fats, oils, or whole milk. Croissants. Butter rolls. Cheese breads. Sweet rolls. Donuts. Buttered popcorn. Chow mein noodles. High-fat crackers, such as cheese or butter crackers. Meats and Other Protein Sources Fatty meats, such as hotdogs, short ribs, sausage, spareribs,  bacon, ribeye roast or steak, and mutton. High-fat deli meats, such as salami and bologna. Caviar. Domestic duck and goose. Organ meats, such as kidney, liver, sweetbreads, brains, gizzard, chitterlings, and heart. Dairy Cream, sour cream, cream cheese, and creamed cottage cheese. Whole milk cheeses, including blue (bleu), Monte[place] Jack, [place], [place], American, [place], [place], [place], [place], and [place]. Whole or 2% milk that is liquid, evaporated, or condensed. Whole buttermilk. Cream sauce or high-fat cheese sauce. Yogurt that is made from whole milk. Beverages Regular sodas and drinks with added sugar. Sweets and Desserts Frosting. Pudding. Cookies. Cakes other than angel food cake. Candy that has milk chocolate or white chocolate, hydrogenated fat, butter, coconut, or unknown ingredients. Buttered syrups. Full-fat ice cream or ice cream drinks. Fats and Oils Gravy that has suet, meat fat, or shortening. Cocoa butter, hydrogenated oils, palm oil, coconut oil, palm kernel oil. These can often be found in baked products, candy, fried foods, nondairy creamers, and whipped toppings. Solid fats and shortenings, including bacon fat, salt pork, lard, and butter. Nondairy cream substitutes, such as coffee creamers and sour cream substitutes. Salad dressings that are made of unknown oils, cheese, or sour cream. The items listed above may not be a complete list of foods and beverages to avoid. Contact your dietitian for more information.   This information is not intended to replace advice given to you by your health care provider. Make sure you discuss any questions you have with your health care provider.   Document Released: 10/27/2007 Document Revised: 02/07/2014 Document Reviewed: 07/11/2013 Elsevier Interactive Patient Education [employer].

## 2015-04-19 NOTE — Progress Notes (Signed)
Patient Name: [name] Date of Encounter: 04/19/2015  Hospital Problem List     Principal Problem:   CAD (coronary artery disease) Active Problems:   Cardiomyopathy, ischemic   Hypertensive heart disease with heart failure (HCC)   Persistent atrial fibrillation (HCC)   Chronic systolic (congestive) heart failure (HCC)   TTP (thrombotic thrombocytopenic purpura) (HCC)   Chronic anticoagulation    Subjective   No c/p or sob.  C/O right wrist discomfort and bruising - u/s ordered yesterday - pending this AM.  Has ambulated and now eager to go home.  Inpatient Medications    . aspirin  81 mg Oral Daily  . atorvastatin  80 mg Oral q1800  . benazepril  10 mg Oral Daily  . clopidogrel  75 mg Oral Q breakfast  . furosemide  20 mg Oral Daily  . levothyroxine  50 mcg Oral QAC breakfast  . LORazepam  0.5 mg Oral 3 times per day  . metoprolol succinate  100 mg Oral Daily  . sodium chloride flush  3 mL Intravenous Q12H  . warfarin  5 mg Oral q1800  . Warfarin - Physician Dosing Inpatient   Does not apply q1800    Vital Signs    Filed Vitals:   04/18/15 1432 04/18/15 1949 04/19/15 0533 04/19/15 0817  BP: 122/67 150/73 153/83 135/67  Pulse: 72 86 90 69  Temp: 98 F (36.7 C) 98 F (36.7 C) 98 F (36.7 C)   TempSrc: Oral Oral    Resp: 19 14 19    Height:      Weight:   270 lb 12.8 oz (122.834 kg)   SpO2: 98% 98% 97%     Intake/Output Summary (Last 24 hours) at 04/19/15 0911 Last data filed at 04/19/15 0536  Gross per 24 hour  Intake    763 ml  Output   1900 ml  Net  -1137 ml   Filed Weights   04/17/15 1020 04/18/15 0430 04/19/15 0533  Weight: 270 lb (122.471 kg) 277 lb 1.9 oz (125.7 kg) 270 lb 12.8 oz (122.834 kg)    Physical Exam    General: Pleasant, NAD. Neuro: Alert and oriented X 3. Moves all extremities spontaneously. Psych: Normal affect. HEENT:  Normal  Neck: Supple without bruits or JVD. Lungs:  Resp regular and unlabored, CTA. Heart: IR, IR  no s3, s4, or murmurs. Abdomen: Soft, non-tender, non-distended, BS + x 4.  Extremities: No clubbing, cyanosis or edema. DP/PT/Radials 2+ and equal bilaterally.  R wrist cath site is ecchymotic and mildly swollen.  No bleeding/bruit/hematoma.  Labs    CBC  Recent Labs  04/18/15 0247  WBC 9.6  HGB 13.4  HCT 40.8  MCV 89.3  PLT [id number]*   Basic Metabolic Panel  Recent Labs  04/18/15 0247  NA 141  K 4.1  CL 107  CO2 27  GLUCOSE 101*  BUN 14  CREATININE 1.12*  CALCIUM 8.0*   Lab Results  Component Value Date   INR 1.10 04/19/2015   INR 1.21 04/18/2015   INR 1.19 04/17/2015     Telemetry    Afib - 70's to 80's.  Radiology    No results found.  Assessment & Plan    1. CAD: S/P cath 3/17 revealing severe LAD dzs. Now s/p PCI. Residual, non-obs Diag and RCA dzs. No c/p or dyspnea. Stayed yesterday b/c she felt weak - has since been ambulating w/o difficutly. Cont asa, plavix, coumadin with discontinuation of asa after 30 days.  Cont  blocker, acei, statin. F/u platelets @ f/u appt given h/o TTP.  2. ICM/Chronic Systolic CHF: Euvolemic on exam. EDP 12 mmHg on cath.  Cont  blocker, acei. F/u echo in 3 mos post-pci and consider EP eval @ that time.  3. Persistent AFib: rate controled on  blocker. INR 1.1 (CHA2DS2VASc = 5). Cont coumadin.  4. Hypertensive Heart Disease: Stable on  blocker, acei, nitrate.  5. HL: Not previously on statin. In setting of LAD dzs with residual diag and RCA dzs, lipitor 80 added. She will need f/u lipids/lft's in 8 wks.  6. Morbid Obesity: Would really benefit from cardiac rehab and aggressive outpt nutritional counseling.  7.  R radial/wrist ecchymosis:  Site stable.  No hematoma, bleeding, bruit - u/s ordered for this AM.    8.  Dispo:  Plan d/c home provided u/s neg.  Signed, Murray Hodgkins NP As above, patient seen and examined. Patient denies chest pain or dyspnea. She is now status post PCI of LAD.  Continue ASA, Plavix and statin. Discontinue aspirin after one month of therapy and continue Plavix/Coumadin long-term. Continue beta blocker. She remains in atrial fibrillation and rate controlled. Continue Coumadin. Continue ACE inhibitor and beta blocker for cardiomyopathy. Advanced ACE inhibitor as an outpatient as tolerated by blood pressure.She will need a repeat echocardiogram 3 months after medications titrated. Radial cath site with no hematoma. DC today and FU Dr Marlou Porch.  > 30 min PA and physician time D2 Kirk Ruths

## 2015-04-19 NOTE — Plan of Care (Signed)
Problem: Cardiac: Goal: Vascular access site(s) Level 0-1 will be maintained Outcome: Progressing Upper extremity ultrasound ordered due to bruising and warmth

## 2015-04-19 NOTE — Evaluation (Signed)
Physical Therapy Evaluation Patient Details Name: [name] MRN: [id number] DOB: Nov 01, 1945 Today's Date: 04/19/2015   History of Present Illness  70 y.o. female with history of paroxysmal atrial fibrillation, previously normal EF now 50%, prior diastolic dysfunction as presentation of atrial fibrillation, diabetes, and obesity. She underwent heart cath 04-17-15.  Clinical Impression  PT eval complete. Pt is independent to modified independent with bed mobility, transfers, and ambulation household distances. Recommend OPPT to address further strengthening for community ambulation. PT signing off.    Follow Up Recommendations Outpatient PT;Supervision - Intermittent    Equipment Recommendations  None recommended by PT    Recommendations for Other Services       Precautions / Restrictions Precautions Precautions: None      Mobility  Bed Mobility Overal bed mobility: Modified Independent                Transfers Overall transfer level: Modified independent Equipment used: None                Ambulation/Gait Ambulation/Gait assistance: Supervision Ambulation Distance (Feet): 180 Feet Assistive device: None Gait Pattern/deviations: Step-through pattern;Decreased stride length;Wide base of support   Gait velocity interpretation: Below normal speed for age/gender General Gait Details: Distance limited by SOB.  Stairs            Wheelchair Mobility    Modified Rankin (Stroke Patients Only)       Balance Overall balance assessment: No apparent balance deficits (not formally assessed)                                           Pertinent Vitals/Pain Pain Assessment: No/denies pain    Home Living Family/patient expects to be discharged to:: Private residence Living Arrangements: Spouse/significant other Available Help at Discharge: Family;Available 24 hours/day Type of Home: House Home Access: Level entry     Home  Layout: One level Home Equipment: Shower seat      Prior Function Level of Independence: Independent               Hand Dominance        Extremity/Trunk Assessment   Upper Extremity Assessment: Overall WFL for tasks assessed           Lower Extremity Assessment: Generalized weakness;RLE deficits/detail RLE Deficits / Details: Pt reports R knee pain limits her gait distance.    Cervical / Trunk Assessment: Normal  Communication   Communication: No difficulties  Cognition Arousal/Alertness: Awake/alert Behavior During Therapy: WFL for tasks assessed/performed Overall Cognitive Status: Within Functional Limits for tasks assessed                      General Comments      Exercises        Assessment/Plan    PT Assessment All further PT needs can be met in the next venue of care  PT Diagnosis Difficulty walking;Generalized weakness   PT Problem List Decreased activity tolerance;Decreased mobility;Pain;Decreased strength  PT Treatment Interventions     PT Goals (Current goals can be found in the Care Plan section) Acute Rehab PT Goals Patient Stated Goal: home today PT Goal Formulation: All assessment and education complete, DC therapy    Frequency     Barriers to discharge        Co-evaluation  End of Session Equipment Utilized During Treatment: Gait belt Activity Tolerance: Patient tolerated treatment well Patient left: in chair;with call bell/phone within reach Nurse Communication: Mobility status    Functional Assessment Tool Used: clinical judgement Functional Limitation: Mobility: Walking and moving around Mobility: Walking and Moving Around Current Status ([id number]): At least 1 percent but less than 20 percent impaired, limited or restricted Mobility: Walking and Moving Around Goal Status [phone]): At least 1 percent but less than 20 percent impaired, limited or restricted Mobility: Walking and Moving Around Discharge  Status [phone]): At least 1 percent but less than 20 percent impaired, limited or restricted    Time: 1016-1028 PT Time Calculation (min) (ACUTE ONLY): 12 min   Charges:   PT Evaluation $PT Eval Moderate Complexity: 1 Procedure     PT G Codes:   PT G-Codes **NOT FOR INPATIENT CLASS** Functional Assessment Tool Used: clinical judgement Functional Limitation: Mobility: Walking and moving around Mobility: Walking and Moving Around Current Status ([id number]): At least 1 percent but less than 20 percent impaired, limited or restricted Mobility: Walking and Moving Around Goal Status [phone]): At least 1 percent but less than 20 percent impaired, limited or restricted Mobility: Walking and Moving Around Discharge Status [phone]): At least 1 percent but less than 20 percent impaired, limited or restricted    Lorriane Shire 04/19/2015, 10:56 AM

## 2015-04-20 MED FILL — Verapamil HCl IV Soln 2.5 MG/ML: INTRAVENOUS | Qty: 2 | Status: AC

## 2015-05-07 ENCOUNTER — Encounter: Payer: Self-pay | Admitting: Cardiology

## 2015-05-07 ENCOUNTER — Ambulatory Visit (INDEPENDENT_AMBULATORY_CARE_PROVIDER_SITE_OTHER): Payer: Medicare Other | Admitting: *Deleted

## 2015-05-07 ENCOUNTER — Ambulatory Visit (INDEPENDENT_AMBULATORY_CARE_PROVIDER_SITE_OTHER): Payer: Medicare Other | Admitting: Cardiology

## 2015-05-07 VITALS — BP 116/64 | HR 62 | Ht 64.0 in | Wt 266.8 lb

## 2015-05-07 DIAGNOSIS — I481 Persistent atrial fibrillation: Secondary | ICD-10-CM | POA: Diagnosis not present

## 2015-05-07 DIAGNOSIS — I4819 Other persistent atrial fibrillation: Secondary | ICD-10-CM

## 2015-05-07 DIAGNOSIS — Z7901 Long term (current) use of anticoagulants: Secondary | ICD-10-CM | POA: Diagnosis not present

## 2015-05-07 DIAGNOSIS — Z5181 Encounter for therapeutic drug level monitoring: Secondary | ICD-10-CM

## 2015-05-07 DIAGNOSIS — I519 Heart disease, unspecified: Secondary | ICD-10-CM

## 2015-05-07 DIAGNOSIS — F419 Anxiety disorder, unspecified: Secondary | ICD-10-CM

## 2015-05-07 LAB — POCT INR: INR: 2.4

## 2015-05-07 MED ORDER — ATORVASTATIN CALCIUM 80 MG PO TABS: 80.0000 mg | ORAL_TABLET | Freq: Every day | ORAL | Status: DC

## 2015-05-07 MED ORDER — CLOPIDOGREL BISULFATE 75 MG PO TABS: 75.0000 mg | ORAL_TABLET | Freq: Every day | ORAL | Status: DC

## 2015-05-07 MED ORDER — ISOSORBIDE MONONITRATE ER 30 MG PO TB24: 30.0000 mg | ORAL_TABLET | Freq: Every day | ORAL | Status: DC

## 2015-05-07 MED ORDER — METOPROLOL SUCCINATE ER 100 MG PO TB24: 100.0000 mg | ORAL_TABLET | Freq: Every day | ORAL | Status: DC

## 2015-05-07 MED ORDER — BENAZEPRIL HCL 10 MG PO TABS: 10.0000 mg | ORAL_TABLET | Freq: Every day | ORAL | Status: DC

## 2015-05-07 MED ORDER — FUROSEMIDE 20 MG PO TABS: 20.0000 mg | ORAL_TABLET | Freq: Every day | ORAL | Status: DC

## 2015-05-07 NOTE — Patient Instructions (Signed)
Medication Instructions:  The current medical regimen is effective;  continue present plan and medications.  Follow-Up: Follow up in 3 months with Richardson Dopp, PA.  If you need a refill on your cardiac medications before your next appointment, please call your pharmacy.  Thank you for choosing [facility]!!

## 2015-05-07 NOTE — Progress Notes (Signed)
[facility]. [address], [address] [place], [place]  [address] Phone: [phone] Fax:  [phone]  Date:  05/07/2015   ID:  [name], DOB 1945/08/19, MRN [id number]  PCP:  Robyne Peers., MD   History of Present Illness: [name] is a 70 y.o. female with persistent atrial fibrillation, previously normal EF now [id number], prior diastolic dysfunction as presentation of atrial fibrillation, diabetes, obesity here for follow up.   She was seen last on 01/05/15 by [place], [place]. Note reviewed, is found to be back in atrial fibrillation by orthopedic doctor. This was after she discontinued Ariel amiodarone. She stopped all other cardiac medications as well. She had not been on Coumadin as well for over 6 months prior to this. She noted increasing dyspnea and fatigue. Blood pressure was quite high 170s. ECHO was performed on 01/06/15 which showed an ejection fraction of 25-30%. This may be related to uncontrolled hypertension as well as potentially A. fib with RVR.  Anticoagulation with novel agent was offered adamant no. She agreed to start Coumadin again. She also did not wish to restart amiodarone. It is likely that rate control strategy will continue.   Weight has been 271 pounds. Husband has cancer of bladder.    October 2012 echocardiogram showed normal EF, borderline left atrial enlargement, trivial valvular lesions. Overall reassuring. Most recent echocardiogram December 2016 showed EF of 25%.   Anxiety surrounding Ariel husband who has bladder cancer. [name] from [place]. We discussed the findings of the repeat echocardiogram after adequate rate control of Ariel atrial fibrillation and Ariel ejection fraction remains low in the 25% range. Previously several years ago, Ariel EF was normal. She does overall report that she is feeling better with current medical therapy. Cath demonstrated 80% mid LAD - DES was placed. 50% 1st diag.    Note, she had prior history of TTP. No further  sequela currently.   Today she is crying once again worried about Ariel husband understandably so. She also told me that she thinks that she has had a stroke. She is having some trouble with weakness on the right side. She ambulated out of the car earlier today to the Coumadin clinic but this was a struggle for Ariel to walk the hallway down here. I immediately recommended calling 911 and sending Ariel to the emergency room. After examining Ariel, Ariel right leg extension seems to be slightly weaker than Ariel left. Hand grip appears normal bilaterally as well as upper arm strength. Cranial nerves appear intact. I once again insisted that she go to the emergency room and she refuses. She just states that she was to go home and be with Ariel husband. She is crying. Ariel daughter asked about lorazepam for anxiety on Ariel medication list.   Overall, she states that she just wants to feel better. She wants an ablation done. I expressed that given Ariel current comorbidities, 6S rate of ablation be quite low and she may not be a candidate. If after Ariel next visit, she is still wishing to pursue this, we could always asked Dr. Rayann Heman to see Ariel in consultation for his opinion.    Wt Readings from Last 3 Encounters:  05/07/15 266 lb 12 oz (120.997 kg)  04/19/15 270 lb 12.8 oz (122.834 kg)  04/15/15 270 lb (122.471 kg)     Past Medical History  Diagnosis Date  . Thrombocytopenia ([facility])   . Hypertension   . 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 Atrial fibrillation status post cardioversion ([facility]) 08/01/2011  . Benign essential HTN 08/01/2011  . Morbid obesity ([facility]) 01/07/2013    Past Surgical History  Procedure Laterality Date  . Abdominal hysterectomy    . Appendectomy    . Abdominal surgery    . Splenectomy, total    . Tonsillectomy    . Back surgery    . Cardiac catheterization N/A 04/17/2015    Procedure: Left Heart Cath and Coronary Angiography;  Surgeon: Jettie Booze, MD;  Location:  [facility];  Service: Cardiovascular;  Laterality: N/A;  . Cardiac catheterization  04/17/2015    Procedure: Coronary Stent Intervention;  Surgeon: Jettie Booze, MD;  Location: [facility];  Service: Cardiovascular;;    Current Outpatient Prescriptions  Medication Sig Dispense Refill  . aspirin 81 MG chewable tablet Chew 1 tablet (81 mg total) by mouth daily.    Marland Kitchen atorvastatin (LIPITOR) 80 MG tablet Take 1 tablet (80 mg total) by mouth daily at 6 PM. 30 tablet 6  . benazepril (LOTENSIN) 10 MG tablet Take 1 tablet (10 mg total) by mouth daily. 30 tablet 11  . clopidogrel (PLAVIX) 75 MG tablet Take 1 tablet (75 mg total) by mouth daily with breakfast. 30 tablet 6  . furosemide (LASIX) 20 MG tablet Take 20 mg by mouth daily.    . isosorbide mononitrate (IMDUR) 30 MG 24 hr tablet Take 30 mg by mouth daily.  11  . levothyroxine (SYNTHROID, LEVOTHROID) 50 MCG tablet Take 50 mcg by mouth daily before breakfast.    . LORazepam (ATIVAN) 0.5 MG tablet Take 0.5 mg by mouth every 8 (eight) hours.    . metoprolol succinate (TOPROL-XL) 100 MG 24 hr tablet Take 1 tablet (100 mg total) by mouth daily. Take with or immediately following a meal. 30 tablet 6  . nitroGLYCERIN (NITROSTAT) 0.4 MG SL tablet Place 1 tablet (0.4 mg total) under the tongue every 5 (five) minutes as needed for chest pain. 25 tablet 3  . warfarin (COUMADIN) 5 MG tablet Take 1 tablet (5 mg total) by mouth daily. 30 tablet 11   No current facility-administered medications for this visit.    Allergies:    Allergies  Allergen Reactions  . Ceftriaxone Rash    ttp  . Levofloxacin Other (See Comments)    Dropped blood platelets extremely low  . Aspirin Other (See Comments)    Patient stated she is unable to take due to blood thinner  . Oxycodone-Acetaminophen Itching  . Penicillins Rash    Pt reports anything that ends with an in    Social History:  The patient  reports that she has never smoked. She does not  have any smokeless tobacco history on file. She reports that she does not drink alcohol or use illicit drugs.   ROS:  Please see the history of present illness.   Occasional back pain. No palpitations.  PHYSICAL EXAM: VS:  BP 116/64 mmHg  Pulse 62  Ht 5\' 4"  (1.626 m)  Wt 266 lb 12 oz (120.997 kg)  BMI 45.77 kg/m2 Well nourished, well developed, in no acute distressCrying at times HEENT: normal Neck: no JVD Cardiac:  normal S1, S2; irregularly irregular; no murmur Lungs:  clear to auscultation bilaterally, no wheezing, rhonchi or rales Abd: soft, nontender, no hepatomegalyobese Ext: no edemaOverweight excellent radial pulse Skin: warm and dry Neuro: Right leg extension at knee may be slightly weaker than left, normal grip strength bilaterally, no cranial  nerves are intact.  EKG:  EKG was ordered today 03/18/15-atrial fib heart rate 74 bpm, rightward axis. Much improved rate.  ECHO: 03/31/15: - Left ventricle: The cavity size was moderately dilated. Wall  thickness was increased in a pattern of mild LVH. Systolic  function was severely reduced. The estimated ejection fraction  was in the range of 25% to 30%. Diffuse hypokinesis. - Left atrium: The atrium was mildly dilated. - Atrial septum: No defect or patent foramen ovale was identified.  Cath 04/17/15:  1st Diag lesion, 50% stenosed.  Mid LAD lesion, 80% stenosed. Post intervention with a 3.5 x 16 Synergy drug eluting stent, there is a 0% residual stenosis.  There is moderate left ventricular systolic dysfunction.  She'll need clopidogrel along with Ariel warfarin for at least several months. Ideally, she could have at least 6 months of antiplatelet therapy. I did not start aspirin because she has refused it earlier today. We'll restart Coumadin. Continue Angiomax for another hour or so. She'll be watched overnight with plan for discharge tomorrow.   ASSESSMENT AND PLAN:  1. Paroxysmal atrial fibrillation - now appears  persistent. She was previously feeling much better with good rate control and good control of Ariel blood pressure. Today however she mentioned she just was to feel better, ablation as described above. We will go ahead and increase Ariel Toprol to 100 mg once a day. Ariel ejection fraction was 25% in December 2016 and currently remains 25% 3 months later. Ariel breathing is improved however. Because she is markedly improved from a symptom medic standpoint with rate controlled atrial fibrillation and because long-term successful rhythm control will be unlikely without antiarrhythmic (she used to be on amiodarone but does not wish to take this anymore), we will stop plans for cardioversion. Continue with rate control strategy. Consider consultative visit with Dr. Rayann Heman in the future to discuss the same issues. 2. Possible stroke-please see above history of present illness. Refuses to go to emergency room. 3. Anxiety surrounding Ariel husband's illness. Please discuss further treatment strategies with Dr. Pricilla Holm. I'm quite concerned about Ariel. 4. Chronic systolic heart failure/cardiomegaly-Cath showed mid LAD 80% lesion stented with DES, 1st diag 50%. Plavix and Coumadin. [name] was present for discussion from [place]. Willing to proceed.  5. Morbid obesity-encourage weight loss. She has lost weight in the past. I encouraged decreasing carbohydrates. 6. Essential hypertension-Well controlled today.  7. Chronic anticoagulation with Coumadin-we are watching this here in our clinic. On Plavix as well because of recent stent placement. 8. Chronic systolic heart failure/cardiomyopathy-as above. beta blocker. She is on ACE inhibitor. Lasix is currently 20 mg. Maintaining weight. Feeling much better previously, now states that she feels awful. No signs of crackles on exam. 9. Maintain close follow-up. after pale, nurse, talked with Ariel for a while at the end of the appointment, she was feeling better. I spoke with  Ariel daughter at length at the end of the appointment and she understands that if Ariel symptoms worsen please encourage Ariel to go to the emergency room. She mentioned that she has been very high anxiety lately.    Signed, [name], MD [facility]  05/07/2015 9:20 AM

## 2015-06-01 ENCOUNTER — Ambulatory Visit (INDEPENDENT_AMBULATORY_CARE_PROVIDER_SITE_OTHER): Payer: Medicare Other | Admitting: *Deleted

## 2015-06-01 DIAGNOSIS — Z5181 Encounter for therapeutic drug level monitoring: Secondary | ICD-10-CM

## 2015-06-01 DIAGNOSIS — I4819 Other persistent atrial fibrillation: Secondary | ICD-10-CM

## 2015-06-01 DIAGNOSIS — I481 Persistent atrial fibrillation: Secondary | ICD-10-CM

## 2015-06-01 LAB — POCT INR: INR: 2.3

## 2015-06-26 ENCOUNTER — Ambulatory Visit (INDEPENDENT_AMBULATORY_CARE_PROVIDER_SITE_OTHER): Payer: Medicare Other | Admitting: Pharmacist

## 2015-06-26 DIAGNOSIS — I481 Persistent atrial fibrillation: Secondary | ICD-10-CM | POA: Diagnosis not present

## 2015-06-26 DIAGNOSIS — Z5181 Encounter for therapeutic drug level monitoring: Secondary | ICD-10-CM | POA: Diagnosis not present

## 2015-06-26 DIAGNOSIS — I4819 Other persistent atrial fibrillation: Secondary | ICD-10-CM

## 2015-06-26 LAB — POCT INR: INR: 2.4

## 2015-08-05 NOTE — Progress Notes (Signed)
Cardiology Office Note:    Date:  08/06/2015   ID:  ARRIEL ONTIVEROS, DOB September 17, 1945, MRN [id number]  PCP:  Robyne Peers., MD  Cardiologist:  Dr. [name]   Electrophysiologist:  n/a  Referring MD: Robyne Peers, MD   Chief Complaint  Patient presents with  . Follow-up    CHF, AFib    History of Present Illness:     WYNDY BEILMAN is a 70 y.o. female with a hx of PAF s/p prior DCCV in [id number], diastolic HF, HTN, DM, obesity, remote TTP (in 2004).  She was noted to be in recurrent atrial fibrillation in 12/16. Echocardiogram at that time demonstrated reduced LV function with an EF of 25-30%.  CHADS2-VASc=5 (DM, HTN, CHF, female, age 29).  She was placed back on Coumadin for anticoagulation. The patient had previously stopped taking amiodarone years ago.  Therefore, rate control strategy was chosen.  Repeat echocardiogram in 3/17 demonstrated continued LV dysfunction with an EF of 25-30%.  Therefore, cardiac catheterization was arranged. This demonstrated severe mid LAD disease and otherwise nonobstructive diagonal and proximal RCA disease. Her LAD was treated successfully with a Synergy DES. She was continued on triple therapy for one month and then remained on Plavix and Coumadin. Last seen by Dr. Marlou Porch 05/07/15.  At that time, she was concerned about her husband who is undergoing treatment for bladder CA. At that appointment, she complained of sudden onset of right sided weakness. Dr. Marlou Porch recommended that she go to the emergency room for evaluation of possible CVA. She refused this. She did question whether or not she could undergo PVI ablation for atrial fibrillation. Successive this was felt to be limited. However, if still interested at follow-up visit, we could refer her to Dr. Rayann Heman for his opinion.  She returns for FU.  Here with her husband.  The patient tells me that she continues to feel poorly.  She notes significant fatigue and DOE since being found to be back in  AFib. She wants to get out of AFib.  She is sedentary due to fatigue from AFib. She notes occ chest discomfort. This has happed a few times at rest.  She denies exertional chest pain.  She thinks it is related to stress from her husband's illness.  She denies assoc symptoms.  She has not taken NTG.  She denies syncope. She denies orthopnea, PND, significant edema.  She does note some dysphagia.  She relates this to prior neck surgery.  She denies bleeding issues.     Past Medical History  Diagnosis Date  . Hypertension   . 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 Chronic atrial fibrillation ([facility]) 08/01/2011  . Morbid obesity ([facility]) 01/07/2013  . CAD (coronary artery disease)     a. LHC 3/17: mLAD 80, D1 50, pRCA 30, EF 25-35%; PCI: 3.5 x 16 mm Synergy DES to mLAD  . Ischemic cardiomyopathy   . Chronic systolic CHF (congestive heart failure) ([facility])     a. Echo 12/16: Mild concentric LVH, EF 25-30%, anteroseptal akinesis, inferoseptal akinesis, anterior akinesis, trivial AI, mild MR, moderate LAE, trivial TR, trivial PI, PASP 33 mmHg  //  b. Echo 2/17: Mild LVH, EF 25-30%, diffuse HK, mild LAE    Past Surgical History  Procedure Laterality Date  . Abdominal hysterectomy    . Appendectomy    . Abdominal surgery    . Splenectomy, total    . Tonsillectomy    . Back surgery    .  Cardiac catheterization N/A 04/17/2015    Procedure: Left Heart Cath and Coronary Angiography;  Surgeon: Jettie Booze, MD;  Location: [facility];  Service: Cardiovascular;  Laterality: N/A;  . Cardiac catheterization  04/17/2015    Procedure: Coronary Stent Intervention;  Surgeon: Jettie Booze, MD;  Location: [facility];  Service: Cardiovascular;;    Current Medications: Outpatient Prescriptions Prior to Visit  Medication Sig Dispense Refill  . atorvastatin (LIPITOR) 80 MG tablet Take 1 tablet (80 mg total) by mouth daily at 6 PM. 90 tablet 3  . benazepril (LOTENSIN) 10 MG  tablet Take 1 tablet (10 mg total) by mouth daily. 90 tablet 3  . clopidogrel (PLAVIX) 75 MG tablet Take 1 tablet (75 mg total) by mouth daily with breakfast. 90 tablet 3  . furosemide (LASIX) 20 MG tablet Take 1 tablet (20 mg total) by mouth daily. 90 tablet 3  . isosorbide mononitrate (IMDUR) 30 MG 24 hr tablet Take 1 tablet (30 mg total) by mouth daily. 90 tablet 3  . levothyroxine (SYNTHROID, LEVOTHROID) 50 MCG tablet Take 50 mcg by mouth daily before breakfast.    . nitroGLYCERIN (NITROSTAT) 0.4 MG SL tablet Place 1 tablet (0.4 mg total) under the tongue every 5 (five) minutes as needed for chest pain. 25 tablet 3  . warfarin (COUMADIN) 5 MG tablet Take 1 tablet (5 mg total) by mouth daily. 30 tablet 11  . aspirin 81 MG chewable tablet Chew 1 tablet (81 mg total) by mouth daily.    . metoprolol succinate (TOPROL-XL) 100 MG 24 hr tablet Take 1 tablet (100 mg total) by mouth daily. Take with or immediately following a meal. 90 tablet 3  . LORazepam (ATIVAN) 0.5 MG tablet Take 0.5 mg by mouth every 8 (eight) hours. Reported on 08/06/2015     No facility-administered medications prior to visit.      Allergies:   Ceftriaxone; Levofloxacin; Levofloxacin; Oxycodone; Alendronate; Aspirin; Oxycodone-acetaminophen; Oxycodone-acetaminophen; Penicillin g; and Penicillins   Social History   Social History  . Marital Status: Married    Spouse Name: N/A  . Number of Children: N/A  . Years of Education: N/A   Social History Main Topics  . Smoking status: Never Smoker   . Smokeless tobacco: None  . Alcohol Use: No  . Drug Use: No  . Sexual Activity: Not Asked   Other Topics Concern  . None   Social History Narrative     Family History:  The patient's family history includes Cancer in her brother and sister; Heart attack in her mother.   ROS:   Please see the history of present illness.    Review of Systems  Cardiovascular: Positive for irregular heartbeat.  Hematologic/Lymphatic:  Bruises/bleeds easily.   All other systems reviewed and are negative.   Physical Exam:    VS:  BP 140/80 mmHg  Pulse 74  Ht 5\' 4"  (1.626 m)  Wt 270 lb (122.471 kg)  BMI 46.32 kg/m2  SpO2 96%   Physical Exam  Constitutional: She is oriented to person, place, and time. She appears well-developed and well-nourished.  HENT:  Head: Normocephalic and atraumatic.  Neck: No JVD present.  Cardiovascular: Normal rate and normal heart sounds.  An irregularly irregular rhythm present.  No murmur heard. Pulmonary/Chest: Effort normal and breath sounds normal. She has no wheezes. She has no rales.  Abdominal: Soft. There is no tenderness.  Musculoskeletal: She exhibits no edema.  Neurological: She is alert and oriented to person,  place, and time.  Skin: Skin is warm and dry.  Psychiatric: She has a normal mood and affect.    Wt Readings from Last 3 Encounters:  08/06/15 270 lb (122.471 kg)  05/07/15 266 lb 12 oz (120.997 kg)  04/19/15 270 lb 12.8 oz (122.834 kg)      Studies/Labs Reviewed:     EKG:  EKG is  ordered today.  The ekg ordered today demonstrates atrial fibrillation, HR 74  Recent Labs: 01/05/2015: Brain Natriuretic Peptide 126.2*; TSH 3.765 04/18/2015: BUN 14; Creatinine, Ser 1.12*; Hemoglobin 13.4; Platelets 404*; Potassium 4.1; Sodium 141   Recent Lipid Panel No results found for: CHOL, TRIG, HDL, CHOLHDL, VLDL, LDLCALC, LDLDIRECT  Additional studies/ records that were reviewed today include:   LHC 04/17/15 LAD mid 80%, D1 50% RCA proximal 30% EF 25-35% PCI: STENT SYNERGY DES 3.5X16 to mLAD   Echo 03/31/15 Mild LVH, EF 25-30%, diffuse HK, mild LAE  Echo 01/06/15 Mild concentric LVH, EF 25-30%, anteroseptal akinesis, inferoseptal akinesis, anterior akinesis, trivial AI, mild MR, moderate LAE, trivial TR, trivial PI, PASP 33 mmHg   ASSESSMENT:     1. Chronic atrial fibrillation ([facility])   2. Chronic systolic (congestive) heart failure ([facility])   3. Coronary artery  disease involving native coronary artery of native heart without angina pectoris   4. Ischemic cardiomyopathy   5. Benign essential HTN   6. Hyperlipidemia     PLAN:     In order of problems listed above:  1. Chronic atrial fibrillation - She remains in atrial fibrillation. Heart rate is fairly well controlled. She notes significant symptoms since noting that she was back in atrial fibrillation. She would like to pursue rhythm control. As noted, this was previously discussed with Dr. Marlou Porch. His recommendation was to refer to EP if she continued to experience symptoms and desired rhythm control. She previously refused amiodarone. She does note that she be willing to retry it.  She has had left atrial enlargement noted on prior echocardiograms. Success of PVI ablation may be limited.  It has been greater than 90 days since her PCI. She needs a follow-up echocardiogram to reassess her LV function.  -  Obtain echocardiogram  -  Refer to Dr. Rayann Heman or Dr. Curt Bears to discuss treatment of atrial fibrillation +/- ICD (if EF < 35%)  2. Chronic systolic CHF - Her volume appears stable on exam. Her weights have been stable. It seems that most of her dyspnea with exertion is related to her atrial fibrillation. Continue current medical regimen which includes beta blocker, ACE inhibitor, nitrates, Lasix. Her heart rate and blood pressure could certainly tolerate increasing her beta blocker dose.  I did consider adding Spironolactone, but she is somewhat resistant to adding more medications to her regimen.  -  Increase Toprol-XL to 100 mg in A and 50 mg in P  -  Obtain FU echo as noted.   3. CAD - She has some atypical chest pain.  This is not c/w angina.  ECG is unchanged.  She is > 90 days out from PCI and remains on triple Rx with ASA, Plavix, Coumadin.  I reviewed her hospital records.  Plan was to DC ASA 30 days after PCI.    -  DC ASA  -  Continue Plavix, Coumadin, statin, beta-blocker, ACE  inhibitor.  4. Ischemic cardiomyopathy - Ejection fraction is 25-30% by echocardiogram in 2/17. She has undergone PCI of the LAD with a DES in 3/17. It has been greater than  90 days since her PCI. Arrange follow-up echocardiogram. If EF remains <35%, discuss possible ICD implantation with EP.   5. HTN - Borderline control.  Continue current regimen.   6. HL - Continue statin.  Arrange FU CMET, Lipids.    Medication Adjustments/Labs and Tests Ordered: Current medicines are reviewed at length with the patient today.  Concerns regarding medicines are outlined above.  Medication changes, Labs and Tests ordered today are outlined in the Patient Instructions noted below. Patient Instructions  Medication Instructions:  1. INCREASE METOPROLOL TO 100 MG IN THE MORNING AND 50 MG IN THE PM; NEW RX HAS BEEN SENT 2. STOP ASPIRIN Labwork: CMET, FASTING LIPID PANEL; THIS CAN BE DONE WHEN YOU COME FOR YOUR ECHO Testing/Procedures: Your physician has requested that you have an echocardiogram. Echocardiography is a painless test that uses sound waves to create images of your heart. It provides your doctor with information about the size and shape of your heart and how well your heart's chambers and valves are working. This procedure takes approximately one hour. There are no restrictions for this procedure. Follow-Up: YOU ARE BEING REFERRED TO ELECTROPHYSIOLOGY WITH EITHER DR. ALLRED OR DR. Curt Bears; DX A-FIB; THIS APPT WILL NEED TO BE AFTER YOUR ECHO HAS BEEN COMPLETED Any Other Special Instructions Will Be Listed Below (If Applicable). If you need a refill on your cardiac medications before your next appointment, please call your pharmacy.   Signed, Richardson Dopp, PA-C  08/06/2015 10:14 AM    [facility] [place], [place], [place]  [address] Phone: [phone]; Fax: [phone]

## 2015-08-06 ENCOUNTER — Ambulatory Visit (INDEPENDENT_AMBULATORY_CARE_PROVIDER_SITE_OTHER): Payer: Medicare Other | Admitting: Physician Assistant

## 2015-08-06 ENCOUNTER — Encounter: Payer: Self-pay | Admitting: Physician Assistant

## 2015-08-06 ENCOUNTER — Ambulatory Visit (INDEPENDENT_AMBULATORY_CARE_PROVIDER_SITE_OTHER): Payer: Medicare Other | Admitting: *Deleted

## 2015-08-06 VITALS — BP 140/80 | HR 74 | Ht 64.0 in | Wt 270.0 lb

## 2015-08-06 DIAGNOSIS — I482 Chronic atrial fibrillation, unspecified: Secondary | ICD-10-CM

## 2015-08-06 DIAGNOSIS — I481 Persistent atrial fibrillation: Secondary | ICD-10-CM | POA: Diagnosis not present

## 2015-08-06 DIAGNOSIS — I2510 Atherosclerotic heart disease of native coronary artery without angina pectoris: Secondary | ICD-10-CM | POA: Diagnosis not present

## 2015-08-06 DIAGNOSIS — I5022 Chronic systolic (congestive) heart failure: Secondary | ICD-10-CM | POA: Diagnosis not present

## 2015-08-06 DIAGNOSIS — I10 Essential (primary) hypertension: Secondary | ICD-10-CM | POA: Diagnosis not present

## 2015-08-06 DIAGNOSIS — Z5181 Encounter for therapeutic drug level monitoring: Secondary | ICD-10-CM | POA: Diagnosis not present

## 2015-08-06 DIAGNOSIS — I255 Ischemic cardiomyopathy: Secondary | ICD-10-CM

## 2015-08-06 DIAGNOSIS — E785 Hyperlipidemia, unspecified: Secondary | ICD-10-CM | POA: Insufficient documentation

## 2015-08-06 DIAGNOSIS — I4819 Other persistent atrial fibrillation: Secondary | ICD-10-CM

## 2015-08-06 LAB — POCT INR: INR: 2.9

## 2015-08-06 MED ORDER — METOPROLOL SUCCINATE ER 100 MG PO TB24: 100.0000 mg | ORAL_TABLET | ORAL | Status: DC

## 2015-08-06 NOTE — Patient Instructions (Addendum)
Medication Instructions:  1. INCREASE METOPROLOL TO 100 MG IN THE MORNING AND 50 MG IN THE PM; NEW RX HAS BEEN SENT 2. STOP ASPIRIN Labwork: CMET, FASTING LIPID PANEL; THIS CAN BE DONE WHEN YOU COME FOR YOUR ECHO Testing/Procedures: Your physician has requested that you have an echocardiogram. Echocardiography is a painless test that uses sound waves to create images of your heart. It provides your doctor with information about the size and shape of your heart and how well your heart's chambers and valves are working. This procedure takes approximately one hour. There are no restrictions for this procedure. Follow-Up: YOU ARE BEING REFERRED TO ELECTROPHYSIOLOGY WITH EITHER DR. ALLRED OR DR. Curt Bears; DX A-FIB; THIS APPT WILL NEED TO BE AFTER YOUR ECHO HAS BEEN COMPLETED Any Other Special Instructions Will Be Listed Below (If Applicable). If you need a refill on your cardiac medications before your next appointment, please call your pharmacy.

## 2015-08-18 ENCOUNTER — Encounter: Payer: Self-pay | Admitting: *Deleted

## 2015-08-19 ENCOUNTER — Institutional Professional Consult (permissible substitution): Payer: Medicare Other | Admitting: Cardiology

## 2015-08-21 ENCOUNTER — Ambulatory Visit (HOSPITAL_COMMUNITY): Payer: Medicare Other | Attending: Cardiovascular Disease

## 2015-08-21 ENCOUNTER — Encounter (INDEPENDENT_AMBULATORY_CARE_PROVIDER_SITE_OTHER): Payer: Self-pay

## 2015-08-21 ENCOUNTER — Other Ambulatory Visit: Payer: Self-pay

## 2015-08-21 ENCOUNTER — Other Ambulatory Visit (INDEPENDENT_AMBULATORY_CARE_PROVIDER_SITE_OTHER): Payer: Medicare Other | Admitting: *Deleted

## 2015-08-21 DIAGNOSIS — I255 Ischemic cardiomyopathy: Secondary | ICD-10-CM | POA: Diagnosis present

## 2015-08-21 DIAGNOSIS — E785 Hyperlipidemia, unspecified: Secondary | ICD-10-CM | POA: Insufficient documentation

## 2015-08-21 DIAGNOSIS — I482 Chronic atrial fibrillation, unspecified: Secondary | ICD-10-CM

## 2015-08-21 DIAGNOSIS — I509 Heart failure, unspecified: Secondary | ICD-10-CM | POA: Insufficient documentation

## 2015-08-21 DIAGNOSIS — I351 Nonrheumatic aortic (valve) insufficiency: Secondary | ICD-10-CM | POA: Insufficient documentation

## 2015-08-21 DIAGNOSIS — Z8249 Family history of ischemic heart disease and other diseases of the circulatory system: Secondary | ICD-10-CM | POA: Diagnosis not present

## 2015-08-21 DIAGNOSIS — I2510 Atherosclerotic heart disease of native coronary artery without angina pectoris: Secondary | ICD-10-CM | POA: Diagnosis not present

## 2015-08-21 DIAGNOSIS — E119 Type 2 diabetes mellitus without complications: Secondary | ICD-10-CM | POA: Diagnosis not present

## 2015-08-21 DIAGNOSIS — I110 Hypertensive heart disease with heart failure: Secondary | ICD-10-CM | POA: Insufficient documentation

## 2015-08-21 LAB — COMPREHENSIVE METABOLIC PANEL
ALBUMIN: 3.2 g/dL — AB (ref 3.6–5.1)
ALK PHOS: 64 U/L (ref 33–130)
ALT: 17 U/L (ref 6–29)
AST: 16 U/L (ref 10–35)
BUN: 14 mg/dL (ref 7–25)
CALCIUM: 8.6 mg/dL (ref 8.6–10.4)
CHLORIDE: 103 mmol/L (ref 98–110)
CO2: 27 mmol/L (ref 20–31)
Creat: 0.99 mg/dL (ref 0.50–0.99)
Glucose, Bld: 96 mg/dL (ref 65–99)
POTASSIUM: 4.2 mmol/L (ref 3.5–5.3)
Sodium: 141 mmol/L (ref 135–146)
TOTAL PROTEIN: 5.9 g/dL — AB (ref 6.1–8.1)
Total Bilirubin: 0.7 mg/dL (ref 0.2–1.2)

## 2015-08-21 LAB — LIPID PANEL
CHOL/HDL RATIO: 2.5 ratio (ref <=5.0)
CHOLESTEROL: 118 mg/dL — AB (ref 125–200)
HDL: 47 mg/dL (ref >=46)
LDL Cholesterol: 53 mg/dL (ref <130)
TRIGLYCERIDES: 89 mg/dL (ref <150)
VLDL: 18 mg/dL (ref <30)

## 2015-08-23 ENCOUNTER — Encounter: Payer: Self-pay | Admitting: Physician Assistant

## 2015-08-27 ENCOUNTER — Other Ambulatory Visit: Payer: Medicare Other

## 2015-08-27 DIAGNOSIS — E559 Vitamin D deficiency, unspecified: Secondary | ICD-10-CM | POA: Insufficient documentation

## 2015-08-27 DIAGNOSIS — M810 Age-related osteoporosis without current pathological fracture: Secondary | ICD-10-CM | POA: Insufficient documentation

## 2015-09-06 NOTE — Progress Notes (Signed)
Electrophysiology Office Note   Date:  09/07/2015   ID:  [name], DOB 1945-07-13, MRN [id number]  PCP:  Robyne Peers., [name]  Cardiologist:  Marlou Porch Primary Electrophysiologist:  Kevia Zaucha Meredith Leeds, [name]    Chief Complaint  Patient presents with  . Advice Only    Afib  . Shortness of Breath     History of Present Illness: [name] is a 70 y.o. female who presents today for electrophysiology evaluation.   Hx of PAF s/p prior DCCV in [id number], diastolic HF, HTN, DM, obesity, remote TTP (in 2004).  She was noted to be in recurrent atrial fibrillation in 12/16. Echocardiogram at that time demonstrated reduced LV function with an EF of 25-30%.  CHADS2-VASc=5 (DM, HTN, CHF, female, age 39).  She was placed on Coumadin for anticoagulation. The patient had previously stopped taking amiodarone years ago.  Therefore, rate control strategy was chosen.  Repeat echocardiogram in 3/17 demonstrated continued LV dysfunction with an EF of 25-30%.  Therefore, cardiac catheterization was arranged. This demonstrated severe mid LAD disease and otherwise nonobstructive diagonal and proximal RCA disease. Her LAD was treated successfully with a Synergy DES. She was continued on triple therapy for one month and then remained on Plavix and Coumadin. Last seen by Dr. Marlou Porch 05/07/15.  At that appointment, she complained of sudden onset of right sided weakness. Dr. Marlou Porch recommended that she go to the emergency room for evaluation of possible CVA. She refused this. She did question whether or not she could undergo PVI ablation for atrial fibrillation.    Today, she denies symptoms of palpitations, chest pain, shortness of breath, orthopnea, PND, lower extremity edema, claudication, dizziness, presyncope, syncope, bleeding, or neurologic sequela. The patient is tolerating medications without difficulties.Her main complaint today is of fatigue. She says that she is able to do some of her activities, but she  is sleeping a lot of the day. She is also been gaining weight. Her husband was diagnosed with bladder cancer and has since had a cystectomy and has been trying to gain weight.    Past Medical History:  Diagnosis Date  . CAD (coronary artery disease)    a. LHC 3/17: mLAD 80, D1 50, pRCA 30, EF 25-35%; PCI: 3.5 x 16 mm Synergy DES to mLAD  . Chronic atrial fibrillation ([place]) 08/01/2011  . Chronic systolic CHF (congestive heart failure) ([place])    a. Echo 12/16: Mild concentric LVH, EF 25-30%, anteroseptal akinesis, inferoseptal akinesis, anterior akinesis, trivial AI, mild MR, moderate LAE, trivial TR, trivial PI, PASP 33 mmHg  //  b. Echo 2/17: Mild LVH, EF 25-30%, diffuse HK, mild LAE  //  c. Echo 7/17: mild LVH, EF 50-55%, no RWMA, trivial AI, mild to mod LAE, mild RAE  . Hypertension   . Ischemic cardiomyopathy   . Morbid obesity ([place]) 01/07/2013  . 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 ABDOMINAL HYSTERECTOMY    . ABDOMINAL SURGERY    . APPENDECTOMY    . BACK SURGERY    . CARDIAC CATHETERIZATION N/A 04/17/2015   Procedure: Left Heart Cath and Coronary Angiography;  Surgeon: Ariel Booze, [name];  Location: [facility];  Service: Cardiovascular;  Laterality: N/A;  . CARDIAC CATHETERIZATION  04/17/2015   Procedure: Coronary Stent Intervention;  Surgeon: Ariel Booze, [name];  Location: [name];  Service: Cardiovascular;;  . SPLENECTOMY, TOTAL    . TONSILLECTOMY  Current Outpatient Prescriptions  Medication Sig Dispense Refill  . atorvastatin (LIPITOR) 80 MG tablet Take 1 tablet (80 mg total) by mouth daily at 6 PM. 90 tablet 3  . benazepril (LOTENSIN) 10 MG tablet Take 1 tablet (10 mg total) by mouth daily. 90 tablet 3  . clopidogrel (PLAVIX) 75 MG tablet Take 1 tablet (75 mg total) by mouth daily with breakfast. 90 tablet 3  . furosemide (LASIX) 20 MG tablet Take 1 tablet (20 mg total)  by mouth daily. 90 tablet 3  . isosorbide mononitrate (IMDUR) 30 MG 24 hr tablet Take 1 tablet (30 mg total) by mouth daily. 90 tablet 3  . levothyroxine (SYNTHROID, LEVOTHROID) 50 MCG tablet Take 50 mcg by mouth daily before breakfast.    . metoprolol succinate (TOPROL-XL) 100 MG 24 hr tablet Take 1 tablet (100 mg total) by mouth as directed. Take 1 tab = (100 mg) in the AM and 1/2 tab = (50 mg)in the PM 180 tablet 3  . nitroGLYCERIN (NITROSTAT) 0.4 MG SL tablet Place 1 tablet (0.4 mg total) under the tongue every 5 (five) minutes as needed for chest pain. 25 tablet 3  . warfarin (COUMADIN) 5 MG tablet Take 1 tablet (5 mg total) by mouth daily. 30 tablet 11   No current facility-administered medications for this visit.     Allergies:   Ceftriaxone; Levofloxacin; Levofloxacin; Oxycodone; Alendronate; Aspirin; Oxycodone-acetaminophen; Oxycodone-acetaminophen; Penicillin g; and Penicillins   Social History:  The patient  reports that she has never smoked. She does not have any smokeless tobacco history on file. She reports that she does not drink alcohol or use drugs.   Family History:  The patient's family history includes Cancer in her brother and sister; Heart attack in her mother.    ROS:  Please see the history of present illness.   Otherwise, review of systems is positive for fatigue.   All other systems are reviewed and negative.    PHYSICAL EXAM: VS:  BP 140/82   Pulse 80   Ht 5\' 4"  (1.626 m)   Wt 276 lb 3.2 oz (125.3 kg)   BMI 47.41 kg/m  , BMI Body mass index is 47.41 kg/m. GEN: Well nourished, well developed, in no acute distress  HEENT: normal  Neck: no JVD, carotid bruits, or masses Cardiac: irregular; no murmurs, rubs, or gallops,no edema  Respiratory:  clear to auscultation bilaterally, normal work of breathing GI: soft, nontender, nondistended, + BS MS: no deformity or atrophy  Skin: warm and dry Neuro:  Strength and sensation are intact Psych: euthymic mood, full  affect  EKG:  EKG is ordered today. Personal review of the ekg ordered shows atrial fibrillation, rate 80, RAD, rate 80  Recent Labs: 01/05/2015: Brain Natriuretic Peptide 126.2; TSH 3.765 04/18/2015: Hemoglobin 13.4; Platelets 404 08/21/2015: ALT 17; BUN 14; Creat 0.99; Potassium 4.2; Sodium 141    Lipid Panel     Component Value Date/Time   CHOL 118 (L) 08/21/2015 1037   TRIG 89 08/21/2015 1037   HDL 47 08/21/2015 1037   CHOLHDL 2.5 08/21/2015 1037   VLDL 18 08/21/2015 1037   LDLCALC 53 08/21/2015 1037     Wt Readings from Last 3 Encounters:  09/07/15 276 lb 3.2 oz (125.3 kg)  08/06/15 270 lb (122.5 kg)  05/07/15 266 lb 12 oz (121 kg)      Other studies Reviewed: Additional studies/ records that were reviewed today include: TTE 08/21/15  Review of the above records today demonstrates:  -  Left ventricle: The cavity size was normal. There was mild   concentric hypertrophy. Systolic function was normal. The   estimated ejection fraction was in the range of 50% to 55%. Wall   motion was normal; there were no regional wall motion   abnormalities. - Aortic valve: There was trivial regurgitation. - Left atrium: The atrium was mildly to moderately dilated. - Right atrium: The atrium was mildly dilated.  Cardiac cath 04/17/15  1st Diag lesion, 50% stenosed.  Mid LAD lesion, 80% stenosed. Post intervention with a 3.5 x 16 Synergy drug eluting stent, there is a 0% residual stenosis.  There is moderate left ventricular systolic dysfunction.   ASSESSMENT AND PLAN:  1. Chronic atrial fibrillation - She remains in atrial fibrillation. Heart rate is fairly well controlled. She notes significant symptoms since noting that she was back in atrial fibrillation. She would like to pursue rhythm control. I discussed with her the options of medical management. She had previously been on amiodarone and was taken off for some reason. She does have coronary disease with class 1 are not  appropriate. We'll therefore plan to admit her for dofetilide loading. Discussed with her the risks and benefits of this medication and she has agreed.  This patients CHA2DS2-VASc Score and unadjusted Ischemic Stroke Rate (% per year) is equal to 4.8 % stroke rate/year from a score of 4  Above score calculated as 1 point each if present [CHF, HTN, DM, Vascular=MI/PAD/Aortic Plaque, Age if 65-74, or Female] Above score calculated as 2 points each if present [Age > 75, or Stroke/TIA/TE]   2. Near. Chroni orc systolic CHF - Improved to normal EF. Continue current medical regimen which includes beta blocker, ACE inhibitor, nitrates, Lasix.  3. CAD - currently no chest pain.  4. HTN - Borderline control.  Continue current regimen.   5. HL - Continue statin.       Current medicines are reviewed at length with the patient today.   The patient does not have concerns regarding her medicines.  The following changes were made today:  Admit for tikosyn loading  Labs/ tests ordered today include:  Orders Placed This Encounter  Procedures  . Basic Metabolic Panel (BMET)  . Magnesium  . EKG 12-Lead     Disposition:   FU with Sayde Lish 3 months   Signed, Lanaya Bennis Meredith Leeds, [name]  09/07/2015 10:47 AM     [facility] [address] [place] [address] [phone] (office) [phone] (fax)

## 2015-09-07 ENCOUNTER — Encounter: Payer: Self-pay | Admitting: Cardiology

## 2015-09-07 ENCOUNTER — Ambulatory Visit (INDEPENDENT_AMBULATORY_CARE_PROVIDER_SITE_OTHER): Payer: Medicare Other | Admitting: Cardiology

## 2015-09-07 VITALS — BP 140/82 | HR 80 | Ht 64.0 in | Wt 276.2 lb

## 2015-09-07 DIAGNOSIS — I482 Chronic atrial fibrillation, unspecified: Secondary | ICD-10-CM

## 2015-09-07 LAB — BASIC METABOLIC PANEL
BUN: 16 mg/dL (ref 7–25)
CHLORIDE: 102 mmol/L (ref 98–110)
CO2: 26 mmol/L (ref 20–31)
Calcium: 8.6 mg/dL (ref 8.6–10.4)
Creat: 0.97 mg/dL — ABNORMAL HIGH (ref 0.60–0.93)
GLUCOSE: 85 mg/dL (ref 65–99)
POTASSIUM: 5 mmol/L (ref 3.5–5.3)
SODIUM: 137 mmol/L (ref 135–146)

## 2015-09-07 LAB — MAGNESIUM: Magnesium: 2.3 mg/dL (ref 1.5–2.5)

## 2015-09-07 NOTE — Patient Instructions (Addendum)
Medication Instructions:  You will be started on Tikosyn to control your Atrial fibrillation   Labwork: TODAY - Basic metabolic panel, Magnesium level   Testing/Procedures: None Ordered   Follow-Up: Your physician recommends that you schedule an appointment for Tikosyn loading with PharmD Your physician recommends that you schedule a follow-up appointment in: 3 months with Dr. Curt Bears.     If you need a refill on your cardiac medications before your next appointment, please call your pharmacy.   Thank you for choosing [facility]! Christen Bame, RN [phone]

## 2015-09-09 ENCOUNTER — Encounter (INDEPENDENT_AMBULATORY_CARE_PROVIDER_SITE_OTHER): Payer: Self-pay

## 2015-09-09 ENCOUNTER — Ambulatory Visit (INDEPENDENT_AMBULATORY_CARE_PROVIDER_SITE_OTHER): Payer: Medicare Other | Admitting: *Deleted

## 2015-09-09 DIAGNOSIS — I482 Chronic atrial fibrillation, unspecified: Secondary | ICD-10-CM

## 2015-09-09 DIAGNOSIS — I481 Persistent atrial fibrillation: Secondary | ICD-10-CM | POA: Diagnosis not present

## 2015-09-09 DIAGNOSIS — I4819 Other persistent atrial fibrillation: Secondary | ICD-10-CM

## 2015-09-09 DIAGNOSIS — Z5181 Encounter for therapeutic drug level monitoring: Secondary | ICD-10-CM

## 2015-09-09 LAB — POCT INR: INR: 1.5

## 2015-09-15 ENCOUNTER — Ambulatory Visit (INDEPENDENT_AMBULATORY_CARE_PROVIDER_SITE_OTHER): Payer: Medicare Other | Admitting: Pharmacist

## 2015-09-15 DIAGNOSIS — I481 Persistent atrial fibrillation: Secondary | ICD-10-CM

## 2015-09-15 DIAGNOSIS — Z5181 Encounter for therapeutic drug level monitoring: Secondary | ICD-10-CM | POA: Diagnosis not present

## 2015-09-15 DIAGNOSIS — I4819 Other persistent atrial fibrillation: Secondary | ICD-10-CM

## 2015-09-15 DIAGNOSIS — I482 Chronic atrial fibrillation, unspecified: Secondary | ICD-10-CM

## 2015-09-15 LAB — POCT INR: INR: 2

## 2015-09-17 ENCOUNTER — Ambulatory Visit: Payer: Medicare Other

## 2015-09-22 ENCOUNTER — Ambulatory Visit: Payer: Medicare Other

## 2015-09-22 ENCOUNTER — Ambulatory Visit (INDEPENDENT_AMBULATORY_CARE_PROVIDER_SITE_OTHER): Payer: Medicare Other | Admitting: *Deleted

## 2015-09-22 DIAGNOSIS — I481 Persistent atrial fibrillation: Secondary | ICD-10-CM

## 2015-09-22 DIAGNOSIS — I482 Chronic atrial fibrillation, unspecified: Secondary | ICD-10-CM

## 2015-09-22 DIAGNOSIS — Z5181 Encounter for therapeutic drug level monitoring: Secondary | ICD-10-CM

## 2015-09-22 DIAGNOSIS — I4819 Other persistent atrial fibrillation: Secondary | ICD-10-CM

## 2015-09-22 LAB — POCT INR: INR: 2

## 2015-09-29 ENCOUNTER — Ambulatory Visit (INDEPENDENT_AMBULATORY_CARE_PROVIDER_SITE_OTHER): Payer: Medicare Other | Admitting: Pharmacist

## 2015-09-29 ENCOUNTER — Inpatient Hospital Stay (HOSPITAL_COMMUNITY)
Admission: AD | Admit: 2015-09-29 | Discharge: 2015-10-04 | DRG: 309 | Disposition: A | Discharge status: Home / Self Care | Payer: Medicare Other | Source: Ambulatory Visit | Attending: Cardiology | Admitting: Cardiology

## 2015-09-29 ENCOUNTER — Encounter (HOSPITAL_COMMUNITY): Payer: Self-pay | Admitting: General Practice

## 2015-09-29 ENCOUNTER — Telehealth: Payer: Self-pay | Admitting: Cardiology

## 2015-09-29 DIAGNOSIS — I481 Persistent atrial fibrillation: Secondary | ICD-10-CM

## 2015-09-29 DIAGNOSIS — Z7901 Long term (current) use of anticoagulants: Secondary | ICD-10-CM | POA: Diagnosis not present

## 2015-09-29 DIAGNOSIS — E7800 Pure hypercholesterolemia, unspecified: Secondary | ICD-10-CM | POA: Diagnosis present

## 2015-09-29 DIAGNOSIS — I10 Essential (primary) hypertension: Secondary | ICD-10-CM | POA: Diagnosis present

## 2015-09-29 DIAGNOSIS — E785 Hyperlipidemia, unspecified: Secondary | ICD-10-CM | POA: Diagnosis not present

## 2015-09-29 DIAGNOSIS — Z886 Allergy status to analgesic agent status: Secondary | ICD-10-CM | POA: Diagnosis not present

## 2015-09-29 DIAGNOSIS — Z9081 Acquired absence of spleen: Secondary | ICD-10-CM

## 2015-09-29 DIAGNOSIS — I110 Hypertensive heart disease with heart failure: Secondary | ICD-10-CM | POA: Diagnosis present

## 2015-09-29 DIAGNOSIS — I255 Ischemic cardiomyopathy: Secondary | ICD-10-CM | POA: Diagnosis present

## 2015-09-29 DIAGNOSIS — Z8249 Family history of ischemic heart disease and other diseases of the circulatory system: Secondary | ICD-10-CM | POA: Diagnosis not present

## 2015-09-29 DIAGNOSIS — Z9842 Cataract extraction status, left eye: Secondary | ICD-10-CM | POA: Diagnosis not present

## 2015-09-29 DIAGNOSIS — I2510 Atherosclerotic heart disease of native coronary artery without angina pectoris: Secondary | ICD-10-CM | POA: Diagnosis present

## 2015-09-29 DIAGNOSIS — Z885 Allergy status to narcotic agent status: Secondary | ICD-10-CM

## 2015-09-29 DIAGNOSIS — I4819 Other persistent atrial fibrillation: Secondary | ICD-10-CM

## 2015-09-29 DIAGNOSIS — Z5181 Encounter for therapeutic drug level monitoring: Secondary | ICD-10-CM | POA: Diagnosis not present

## 2015-09-29 DIAGNOSIS — Z961 Presence of intraocular lens: Secondary | ICD-10-CM | POA: Diagnosis present

## 2015-09-29 DIAGNOSIS — Z880 Allergy status to penicillin: Secondary | ICD-10-CM | POA: Diagnosis not present

## 2015-09-29 DIAGNOSIS — I480 Paroxysmal atrial fibrillation: Secondary | ICD-10-CM

## 2015-09-29 DIAGNOSIS — I5022 Chronic systolic (congestive) heart failure: Secondary | ICD-10-CM | POA: Diagnosis present

## 2015-09-29 DIAGNOSIS — Z7902 Long term (current) use of antithrombotics/antiplatelets: Secondary | ICD-10-CM | POA: Diagnosis not present

## 2015-09-29 DIAGNOSIS — Z881 Allergy status to other antibiotic agents status: Secondary | ICD-10-CM

## 2015-09-29 DIAGNOSIS — R0602 Shortness of breath: Secondary | ICD-10-CM

## 2015-09-29 DIAGNOSIS — Z9841 Cataract extraction status, right eye: Secondary | ICD-10-CM | POA: Diagnosis not present

## 2015-09-29 DIAGNOSIS — Z6841 Body Mass Index (BMI) 40.0 and over, adult: Secondary | ICD-10-CM | POA: Diagnosis not present

## 2015-09-29 DIAGNOSIS — Z79899 Other long term (current) drug therapy: Secondary | ICD-10-CM

## 2015-09-29 DIAGNOSIS — E039 Hypothyroidism, unspecified: Secondary | ICD-10-CM | POA: Diagnosis present

## 2015-09-29 DIAGNOSIS — I25810 Atherosclerosis of coronary artery bypass graft(s) without angina pectoris: Secondary | ICD-10-CM | POA: Diagnosis not present

## 2015-09-29 DIAGNOSIS — I4891 Unspecified atrial fibrillation: Secondary | ICD-10-CM | POA: Diagnosis not present

## 2015-09-29 DIAGNOSIS — I340 Nonrheumatic mitral (valve) insufficiency: Secondary | ICD-10-CM | POA: Diagnosis not present

## 2015-09-29 HISTORY — DX: Pure hypercholesterolemia, unspecified: E78.00

## 2015-09-29 HISTORY — DX: Hypothyroidism, unspecified: E03.9

## 2015-09-29 HISTORY — DX: Pneumonia, unspecified organism: J18.9

## 2015-09-29 HISTORY — DX: Personal history of other medical treatment: Z92.89

## 2015-09-29 HISTORY — DX: Spondylosis, unspecified: M47.9

## 2015-09-29 HISTORY — DX: Simple febrile convulsions: R56.00

## 2015-09-29 HISTORY — DX: Spondylosis without myelopathy or radiculopathy, thoracolumbar region: M47.815

## 2015-09-29 HISTORY — DX: Scoliosis, unspecified: M41.9

## 2015-09-29 HISTORY — DX: Nontoxic single thyroid nodule: E04.1

## 2015-09-29 HISTORY — DX: Paroxysmal atrial fibrillation: I48.0

## 2015-09-29 LAB — BASIC METABOLIC PANEL
Anion gap: 7 (ref 5–15)
BUN: 13 mg/dL (ref 6–20)
CHLORIDE: 106 mmol/L (ref 101–111)
CO2: 28 mmol/L (ref 22–32)
Calcium: 9 mg/dL (ref 8.9–10.3)
Creatinine, Ser: 1.07 mg/dL — ABNORMAL HIGH (ref 0.44–1.00)
GFR calc Af Amer: 60 mL/min — ABNORMAL LOW (ref >60)
GFR calc non Af Amer: 51 mL/min — ABNORMAL LOW (ref >60)
GLUCOSE: 100 mg/dL — AB (ref 65–99)
POTASSIUM: 3.8 mmol/L (ref 3.5–5.1)
Sodium: 141 mmol/L (ref 135–145)

## 2015-09-29 LAB — POCT INR: INR: 2.3

## 2015-09-29 LAB — MAGNESIUM: MAGNESIUM: 2.2 mg/dL (ref 1.7–2.4)

## 2015-09-29 MED ORDER — METOPROLOL SUCCINATE ER 100 MG PO TB24: 100.0000 mg | ORAL_TABLET | ORAL | Status: DC

## 2015-09-29 MED ORDER — ISOSORBIDE MONONITRATE ER 30 MG PO TB24
30.0000 mg | ORAL_TABLET | Freq: Every day | ORAL | Status: DC
  Administered 2015-09-30 – 2015-10-04 (×5): 30 mg via ORAL
  Filled 2015-09-29 (×5): qty 1

## 2015-09-29 MED ORDER — SODIUM CHLORIDE 0.9 % IV SOLN
250.0000 mL | INTRAVENOUS | Status: DC | PRN
  Administered 2015-09-30: 10:00:00 via INTRAVENOUS

## 2015-09-29 MED ORDER — WARFARIN SODIUM 7.5 MG PO TABS
7.5000 mg | ORAL_TABLET | ORAL | Status: DC
  Administered 2015-09-30: 7.5 mg via ORAL
  Filled 2015-09-29: qty 1

## 2015-09-29 MED ORDER — NITROGLYCERIN 0.4 MG SL SUBL: 0.4000 mg | SUBLINGUAL_TABLET | SUBLINGUAL | Status: DC | PRN

## 2015-09-29 MED ORDER — FUROSEMIDE 20 MG PO TABS
20.0000 mg | ORAL_TABLET | Freq: Every day | ORAL | Status: DC
  Administered 2015-10-01 – 2015-10-04 (×4): 20 mg via ORAL
  Filled 2015-09-29 (×4): qty 1

## 2015-09-29 MED ORDER — SODIUM CHLORIDE 0.9% FLUSH: 3.0000 mL | INTRAVENOUS | Status: DC | PRN

## 2015-09-29 MED ORDER — SODIUM CHLORIDE 0.9 % IV SOLN
INTRAVENOUS | Status: DC
  Administered 2015-09-30: via INTRAVENOUS

## 2015-09-29 MED ORDER — SODIUM CHLORIDE 0.9% FLUSH
3.0000 mL | Freq: Two times a day (BID) | INTRAVENOUS | Status: DC
  Administered 2015-09-30: 3 mL via INTRAVENOUS

## 2015-09-29 MED ORDER — LEVOTHYROXINE SODIUM 50 MCG PO TABS
50.0000 ug | ORAL_TABLET | Freq: Every day | ORAL | Status: DC
  Administered 2015-10-01 – 2015-10-04 (×4): 50 ug via ORAL
  Filled 2015-09-29 (×4): qty 1

## 2015-09-29 MED ORDER — CLOPIDOGREL BISULFATE 75 MG PO TABS
75.0000 mg | ORAL_TABLET | Freq: Every day | ORAL | Status: DC
  Administered 2015-09-30 – 2015-10-04 (×5): 75 mg via ORAL
  Filled 2015-09-29 (×5): qty 1

## 2015-09-29 MED ORDER — BENAZEPRIL HCL 10 MG PO TABS
10.0000 mg | ORAL_TABLET | Freq: Every day | ORAL | Status: DC
  Administered 2015-10-01 – 2015-10-04 (×4): 10 mg via ORAL
  Filled 2015-09-29 (×6): qty 1

## 2015-09-29 MED ORDER — WARFARIN SODIUM 5 MG PO TABS: 5.0000 mg | ORAL_TABLET | ORAL | Status: DC

## 2015-09-29 MED ORDER — ATORVASTATIN CALCIUM 80 MG PO TABS
80.0000 mg | ORAL_TABLET | Freq: Every day | ORAL | Status: DC
  Administered 2015-09-29 – 2015-10-03 (×5): 80 mg via ORAL
  Filled 2015-09-29 (×5): qty 1

## 2015-09-29 MED ORDER — WARFARIN - PHARMACIST DOSING INPATIENT
Freq: Every day | Status: DC
  Administered 2015-10-03: 18:00:00

## 2015-09-29 MED ORDER — POTASSIUM CHLORIDE CRYS ER 20 MEQ PO TBCR
40.0000 meq | EXTENDED_RELEASE_TABLET | Freq: Once | ORAL | Status: AC
  Administered 2015-09-29: 40 meq via ORAL
  Filled 2015-09-29: qty 2

## 2015-09-29 NOTE — Telephone Encounter (Signed)
Attempted to call Cathy at [facility] x 2 with no VM set-up and no answer.

## 2015-09-29 NOTE — Progress Notes (Signed)
Pt has arrived as a direct admit for tikosyn administration. Telemetry box has been applied and CCMD has been notified. MD, admissions nurse, and pharmacy tech have been notified of pt's arrival. No orders at this time. Pt has no complaints and vitals are stable. Pt has been oriented to room. Pt is resting in chair at this time.   Grant Fontana BSN, RN

## 2015-09-29 NOTE — Telephone Encounter (Signed)
[name] is calling because she has a question about the medications Plavix and Coumadin and trying to find out if that is supposed to be. Please call   Thanks

## 2015-09-29 NOTE — Progress Notes (Signed)
Patient ID: [name]                 DOB: [birth year]                    MRN: [id number]     HPI: [name] is a 70 y.o. female patient of Dr. Curt Bears who presents today for Tikosyn initiation. Hx of PAF s/p prior DCCV in [id number], diastolic HF, HTN, DM, obesity, remote TTP (in 2004). She was noted to be in recurrent atrial fibrillation in 12/16. Echocardiogram at that time demonstrated reduced LV function with an EF of 25-30%. CHADS2-VASc=5 (DM, HTN, CHF, female, age 89). She was placed on Coumadin for anticoagulation. The patient had previously stopped taking amiodarone years ago. Seen by Dr. Curt Bears earlier this month and decision was made to initiate Tikosyn.  Reviewed pt's medication list. Pt does not take any contraindicated or QTc prolonging medications. She is anticoagulated with warfarin - today is her 3rd weekly INR check at goal > 2.Discussed importance of compliance with patient. She is aware he will have to report to the hospital if she misses more than 2 doses in a row. Discussed cost with patient and generic dofetilide is covered with monthly copay of $95 - this is affordable for pt.  EKG reviewed by Dr. Lovena Le and Dr. Curt Bears. Pt in atrial fibrillation. QTc 470msec, vent rate of 84bpm. Labs: K 3.8, Mg 2.2, SCr 1.07, Wt 276 lb, CrCl 33mL/min  Past Medical History:  Diagnosis Date  . CAD (coronary artery disease)    a. LHC 3/17: mLAD 80, D1 50, pRCA 30, EF 25-35%; PCI: 3.5 x 16 mm Synergy DES to mLAD  . Chronic atrial fibrillation ([place]) 08/01/2011  . Chronic systolic CHF (congestive heart failure) ([place])    a. Echo 12/16: Mild concentric LVH, EF 25-30%, anteroseptal akinesis, inferoseptal akinesis, anterior akinesis, trivial AI, mild MR, moderate LAE, trivial TR, trivial PI, PASP 33 mmHg  //  b. Echo 2/17: Mild LVH, EF 25-30%, diffuse HK, mild LAE  //  c. Echo 7/17: mild LVH, EF 50-55%, no RWMA, trivial AI, mild to mod LAE, mild RAE  . Hypertension   . Ischemic  cardiomyopathy   . Morbid obesity ([place]) 01/07/2013  . TTP (thrombotic thrombocytopenic purpura) ([place]) 05/03/2011    Current Outpatient Prescriptions on File Prior to Visit  Medication Sig Dispense Refill  . atorvastatin (LIPITOR) 80 MG tablet Take 1 tablet (80 mg total) by mouth daily at 6 PM. 90 tablet 3  . benazepril (LOTENSIN) 10 MG tablet Take 1 tablet (10 mg total) by mouth daily. 90 tablet 3  . clopidogrel (PLAVIX) 75 MG tablet Take 1 tablet (75 mg total) by mouth daily with breakfast. 90 tablet 3  . furosemide (LASIX) 20 MG tablet Take 1 tablet (20 mg total) by mouth daily. 90 tablet 3  . isosorbide mononitrate (IMDUR) 30 MG 24 hr tablet Take 1 tablet (30 mg total) by mouth daily. 90 tablet 3  . levothyroxine (SYNTHROID, LEVOTHROID) 50 MCG tablet Take 50 mcg by mouth daily before breakfast.    . metoprolol succinate (TOPROL-XL) 100 MG 24 hr tablet Take 1 tablet (100 mg total) by mouth as directed. Take 1 tab = (100 mg) in the AM and 1/2 tab = (50 mg)in the PM 180 tablet 3  . nitroGLYCERIN (NITROSTAT) 0.4 MG SL tablet Place 1 tablet (0.4 mg total) under the tongue every 5 (five) minutes as needed for chest pain. 25 tablet 3  .  warfarin (COUMADIN) 5 MG tablet Take 1 tablet (5 mg total) by mouth daily. 30 tablet 11   No current facility-administered medications on file prior to visit.     Allergies  Allergen Reactions  . Ceftriaxone Rash    ttp  . Levofloxacin Other (See Comments)    Dropped blood platelets extremely low  . Levofloxacin     Other reaction(s): Thrombocytopenia (intolerance)  . Oxycodone Itching  . Alendronate Rash  . Aspirin Other (See Comments)    Patient stated she is unable to take due to blood thinner  . Oxycodone-Acetaminophen Itching  . Oxycodone-Acetaminophen Itching  . Penicillin G Rash  . Penicillins Rash    Pt reports anything that ends with an in    Assessment/Plan:  1. Atrial fibrillation - EKG reviewed by Dr. Curt Bears and acceptable for Tikosyn  load. Labs reviewed - Mg acceptable and K a little low at 3.8 - will need supplementation at the hospital. CrCl 75mL/min, anticipated starting dose of Tikosyn is 540mcg BID. Cost is affordable - pt will require generic dofetilide at discharge since insurance does not cover brand. Pt aware to report to admitting.   Megan E. Supple, PharmD, [place] [id number], [place], [place] [address] Phone: [phone]; Fax: [phone] 09/29/2015 10:24 AM

## 2015-09-29 NOTE — H&P (Signed)
ELECTROPHYSIOLOGY HISTORY AND PHYSICAL     Patient ID: AMARELY WHITCOMB MRN: [id number], DOB/AGE: 70-May-1947 70 y.o.  Admit date: 09/29/2015 Date of Consult: 09/29/2015  Primary Physician: Robyne Peers., MD Primary Cardiologist: Marlou Porch Electrophysiologist: Curt Bears  CC: here for Tikosyn load   HPI:  [name] is a 70 y.o. female with a past medical history significant for CAD, chronic systolic heart failure (EF normalized post revascularization), hypertension, and morbid obesity. She has symptomatic atrial fibrillation and was seen in the office recently by Dr Curt Bears to discuss treatment options.  She elected to pursue rhythm control with Tikosyn and presents today for admission. She currently denies chest pain, recent fevers, chills, nausea or vomiting. She is on Warfarin and has had therapeutic INR's for the last 2 weeks.  She reports ongoing shortness of breath with exertion and fatigue related to atrial fibrillation. She has not had recent fevers, chills, nausea, vomiting, dizziness, syncope, or pre-syncope.   Echo 08/2015 demonstrated EF 50-55%, no RWMA, LA 44  Past Medical History:  Diagnosis Date  . Anginal pain ([place])   . CAD (coronary artery disease)    a. LHC 3/17: mLAD 80, D1 50, pRCA 30, EF 25-35%; PCI: 3.5 x 16 mm Synergy DES to mLAD  . Chronic atrial fibrillation ([place]) 08/01/2011  . Chronic systolic CHF (congestive heart failure) ([place])    a. Echo 12/16: Mild concentric LVH, EF 25-30%, anteroseptal akinesis, inferoseptal akinesis, anterior akinesis, trivial AI, mild MR, moderate LAE, trivial TR, trivial PI, PASP 33 mmHg  //  b. Echo 2/17: Mild LVH, EF 25-30%, diffuse HK, mild LAE  //  c. Echo 7/17: mild LVH, EF 50-55%, no RWMA, trivial AI, mild to mod LAE, mild RAE  . Febrile seizure ([place]) 2004   "during TTP thing; cause my temp was 106"  . High cholesterol   . History of blood transfusion 2004   "several; related to TTP"  . Hypertension   . Hypothyroidism     . Ischemic cardiomyopathy   . Morbid obesity ([place]) 01/07/2013  . Osteoarthritis of back   . Pneumonia ?1999  . Scoliosis   . Thyroid nodule    "grew back after 2 partial thyroid surgeries" (09/29/2015)  . TTP (thrombotic thrombocytopenic purpura) ([place]) 05/03/2011     Surgical History:  Past Surgical History:  Procedure Laterality Date  . ABDOMINAL EXPLORATION SURGERY     "opened me up 2-3 times for blocked intestines"  . APPENDECTOMY    . BACK SURGERY    . BASAL CELL CARCINOMA EXCISION Right    "neck"  . CARDIAC CATHETERIZATION N/A 04/17/2015   Procedure: Left Heart Cath and Coronary Angiography;  Surgeon: Jettie Booze, MD;  Location: [facility];  Service: Cardiovascular;  Laterality: N/A;  . CARDIAC CATHETERIZATION  04/17/2015   Procedure: Coronary Stent Intervention;  Surgeon: Jettie Booze, MD;  Location: [facility];  Service: Cardiovascular;;  . CARDIOVERSION  06/2002; 03/2005; 05/2005   Archie Endo 06/16/2010  . CATARACT EXTRACTION W/ INTRAOCULAR LENS  IMPLANT, BILATERAL Bilateral   . CHOLECYSTECTOMY OPEN    . CORONARY ANGIOPLASTY    . DILATION AND CURETTAGE OF UTERUS     S/P "miscarriage"  . FOOT SURGERY Right    "for nerve damage"  . SALIVARY STONE REMOVAL Left    "had tumor wrapped around it"  . SPLENECTOMY, TOTAL     Archie Endo 06/16/2010  . THORACIC DISCECTOMY  05/2002  . THYROIDECTOMY, PARTIAL  X 2   "grew  back"  . TONSILLECTOMY    . TUBAL LIGATION    . TUMOR EXCISION     "benign tumor in my neck"  . VAGINAL HYSTERECTOMY       Prescriptions Prior to Admission  Medication Sig Dispense Refill Last Dose  . atorvastatin (LIPITOR) 80 MG tablet Take 1 tablet (80 mg total) by mouth daily at 6 PM. 90 tablet 3 Taking  . benazepril (LOTENSIN) 10 MG tablet Take 1 tablet (10 mg total) by mouth daily. 90 tablet 3 Taking  . clopidogrel (PLAVIX) 75 MG tablet Take 1 tablet (75 mg total) by mouth daily with breakfast. 90 tablet 3 Taking  . furosemide (LASIX)  20 MG tablet Take 1 tablet (20 mg total) by mouth daily. 90 tablet 3 Taking  . isosorbide mononitrate (IMDUR) 30 MG 24 hr tablet Take 1 tablet (30 mg total) by mouth daily. 90 tablet 3 Taking  . levothyroxine (SYNTHROID, LEVOTHROID) 50 MCG tablet Take 50 mcg by mouth daily before breakfast.   Taking  . metoprolol succinate (TOPROL-XL) 100 MG 24 hr tablet Take 1 tablet (100 mg total) by mouth as directed. Take 1 tab = (100 mg) in the AM and 1/2 tab = (50 mg)in the PM 180 tablet 3 Taking  . nitroGLYCERIN (NITROSTAT) 0.4 MG SL tablet Place 1 tablet (0.4 mg total) under the tongue every 5 (five) minutes as needed for chest pain. 25 tablet 3 Taking  . warfarin (COUMADIN) 5 MG tablet Take 1 tablet (5 mg total) by mouth daily. 30 tablet 11 Taking    Inpatient Medications:   Allergies:  Allergies  Allergen Reactions  . Ceftriaxone Rash    ttp  . Levofloxacin Other (See Comments)    Dropped blood platelets extremely low  . Levofloxacin     Other reaction(s): Thrombocytopenia (intolerance)  . Oxycodone Itching  . Alendronate Rash  . Aspirin Other (See Comments)    Patient stated she is unable to take due to blood thinner  . Oxycodone-Acetaminophen Itching  . Oxycodone-Acetaminophen Itching  . Penicillin G Rash  . Penicillins Rash    Pt reports anything that ends with an in    Social History   Social History  . Marital status: Married    Spouse name: N/A  . Number of children: N/A  . Years of education: N/A   Occupational History  . Not on file.   Social History Main Topics  . Smoking status: Never Smoker  . Smokeless tobacco: Never Used  . Alcohol use No  . Drug use: No  . Sexual activity: No   Other Topics Concern  . Not on file   Social History Narrative  . No narrative on file     Family History  Problem Relation Age of Onset  . Heart attack Mother   . Cancer Sister   . Cancer Brother      Review of Systems: All other systems reviewed and are otherwise negative  except as noted above.  Physical Exam: Vitals:   09/29/15 1400  BP: 116/64  Pulse: 93  SpO2: 96%    GEN- The patient is morbidly obese appearing, alert and oriented x 3 today.   HEENT: normocephalic, atraumatic; sclera clear, conjunctiva pink; hearing intact; oropharynx clear; neck supple  Lungs- Clear to ausculation bilaterally, normal work of breathing.  No wheezes, rales, rhonchi Heart- Irregular rate and rhythm  GI- obese, non-tender, non-distended, bowel sounds present  Extremities- no clubbing, cyanosis, or edema  MS- no significant deformity or atrophy  Skin- warm and dry, no rash or lesion Psych- euthymic mood, full affect Neuro- strength and sensation are intact  Labs:   Lab Results  Component Value Date   WBC 9.6 04/18/2015   HGB 13.4 04/18/2015   HCT 40.8 04/18/2015   MCV 89.3 04/18/2015   PLT 404 (H) 04/18/2015     Recent Labs Lab 09/29/15 1034  NA 141  K 3.8  CL 106  CO2 28  BUN 13  CREATININE 1.07*  CALCIUM 9.0  GLUCOSE 100*      Radiology/Studies: No results found.  [id number] fibrillation, rate 84, QTc acceptable   Assessment/Plan: 1.  Persistent atrial fibrillation The patient has symptomatic persistent atrial fibrillation.   She has had therapeutic INR's for the last 2 weeks. [name] need TEE prior to initiation of Tikosyn.  TEE tomorrow morning - risks, benefits discussed with patient by Dr Curt Bears K 3.8 - [name] supplement today Continue Warfarin for CHADS2VASC of 4 - pharmacy to dose   2. Morbid obesity Weight loss encouraged Our ability to maintain SR is reduced with obesity  3.  CAD No recent ischemic symptoms  Continue current therapy   4.  HTN Stable No change required today   Signed, Ariel Marshall, NP 09/29/2015 3:38 PM      I have seen and examined this patient with [name].  Agree with above, note added to reflect my findings.  On exam, irregular rhythm, no murmurs, lungs clear.  Admit for tikosyn loading.   Unfortunately, did not have 3 weeks of thearputic INRs, and thus [name] need TEE to rule out clot.  If no clot, [name] start tikosyn tomorrow.    [name] M. Cicley Ganesh MD 09/29/2015 3:50 PM

## 2015-09-29 NOTE — Progress Notes (Signed)
ANTICOAGULATION CONSULT NOTE - Initial Consult  Pharmacy Consult for Coumadin Indication: atrial fibrillation  Allergies  Allergen Reactions  . Ceftriaxone Rash    "TTP"/Was hospitalized and in a coma for 60 days, per patient  . Levofloxacin Other (See Comments)    Dropped blood platelets extremely low  . Levofloxacin     Other reaction(s): Thrombocytopenia (intolerance)  . Oxycodone Itching  . Alendronate Rash  . Aspirin Other (See Comments)    Patient stated she is unable to take due to blood thinner  . Oxycodone-Acetaminophen Itching  . Oxycodone-Acetaminophen Itching  . Penicillin G Rash  . Penicillins Rash and Other (See Comments)    Cannot tolerate any "-CILLINS" Has patient had a PCN reaction causing immediate rash, facial/tongue/throat swelling, SOB or lightheadedness with hypotension: Yes Has patient had a PCN reaction causing severe rash involving mucus membranes or skin necrosis: No Has patient had a PCN reaction that required hospitalization No Has patient had a PCN reaction occurring within the last 10 years: Yes If all of the above answers are "NO", then may proceed with Cephalosporin use. Causes WELTS!!    Patient Measurements: Height: 5\' 4"  (162.6 cm) Weight: 278 lb 11.2 oz (126.4 kg) IBW/kg (Calculated) : 54.7  Vital Signs: BP: 116/64 (08/29 1400) Pulse Rate: 93 (08/29 1400)  Labs:  Recent Labs  09/29/15 0928 09/29/15 1034  INR 2.3  --   CREATININE  --  1.07*    Estimated Creatinine Clearance: 64.4 mL/min (by C-G formula based on SCr of 1.07 mg/dL).   Medical History: Past Medical History:  Diagnosis Date  . Anginal pain ([place])   . CAD (coronary artery disease)    a. LHC 3/17: mLAD 80, D1 50, pRCA 30, EF 25-35%; PCI: 3.5 x 16 mm Synergy DES to mLAD  . Chronic atrial fibrillation ([place]) 08/01/2011  . Chronic systolic CHF (congestive heart failure) ([place])    a. Echo 12/16: Mild concentric LVH, EF 25-30%, anteroseptal akinesis, inferoseptal  akinesis, anterior akinesis, trivial AI, mild MR, moderate LAE, trivial TR, trivial PI, PASP 33 mmHg  //  b. Echo 2/17: Mild LVH, EF 25-30%, diffuse HK, mild LAE  //  c. Echo 7/17: mild LVH, EF 50-55%, no RWMA, trivial AI, mild to mod LAE, mild RAE  . Febrile seizure ([place]) 2004   "during TTP thing; cause my temp was 106"  . High cholesterol   . History of blood transfusion 2004   "several; related to TTP"  . Hypertension   . Hypothyroidism   . Ischemic cardiomyopathy   . Morbid obesity ([place]) 01/07/2013  . Osteoarthritis of back   . Pneumonia ?1999  . Scoliosis   . Thyroid nodule    "grew back after 2 partial thyroid surgeries" (09/29/2015)  . 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 atorvastatin  80 mg Oral q1800  . benazepril  10 mg Oral Daily  . [START ON 09/30/2015] clopidogrel  75 mg Oral Q breakfast  . furosemide  20 mg Oral Daily  . isosorbide mononitrate  30 mg Oral Daily  . [START ON 09/30/2015] levothyroxine  50 mcg Oral QAC breakfast  . metoprolol succinate  100 mg Oral UD  . sodium chloride flush  3 mL Intravenous Q12H    Assessment: 70yo morbidly obese female on Coumadin for AFib and being admitted for Tikosyn initiation.  Coumadin clinic note today, with therapeutic INR and told to continue current dosing of 5mg  daily except 7.5mg  on Wed and  Sundays.  Per d/w patient, she has taken dose for today.  K was 3.8 in office and Mg 2.2.  TEE in AM prior to starting Tikosyn and plan to supplement K noted, but not ordered  Goal of Therapy:  INR 2-3   Plan:  Daily INR Coumadin 5mg  daily x 7.5mg  on Wed and Sun, next dose 8/30 Watch for s/s of bleeding F/U K & Mg with Tikosyn loading  Gracy Bruins, PharmD Clinical Pharmacist [facility]

## 2015-09-30 ENCOUNTER — Encounter (HOSPITAL_COMMUNITY)
Admission: AD | Disposition: A | Discharge status: Home / Self Care | Payer: Self-pay | Source: Ambulatory Visit | Attending: Cardiology

## 2015-09-30 ENCOUNTER — Inpatient Hospital Stay (HOSPITAL_COMMUNITY): Payer: Medicare Other | Admitting: Anesthesiology

## 2015-09-30 ENCOUNTER — Encounter (HOSPITAL_COMMUNITY): Payer: Self-pay | Admitting: Cardiovascular Disease

## 2015-09-30 ENCOUNTER — Inpatient Hospital Stay (HOSPITAL_COMMUNITY): Payer: Medicare Other

## 2015-09-30 DIAGNOSIS — I340 Nonrheumatic mitral (valve) insufficiency: Secondary | ICD-10-CM

## 2015-09-30 DIAGNOSIS — I4891 Unspecified atrial fibrillation: Secondary | ICD-10-CM

## 2015-09-30 HISTORY — PX: TEE WITHOUT CARDIOVERSION: SHX5443

## 2015-09-30 LAB — BASIC METABOLIC PANEL
Anion gap: 7 (ref 5–15)
BUN: 15 mg/dL (ref 6–20)
CHLORIDE: 108 mmol/L (ref 101–111)
CO2: 25 mmol/L (ref 22–32)
Calcium: 8.3 mg/dL — ABNORMAL LOW (ref 8.9–10.3)
Creatinine, Ser: 0.94 mg/dL (ref 0.44–1.00)
GFR calc Af Amer: >60 mL/min (ref >60)
GFR calc non Af Amer: >60 mL/min (ref >60)
Glucose, Bld: 103 mg/dL — ABNORMAL HIGH (ref 65–99)
POTASSIUM: 4 mmol/L (ref 3.5–5.1)
SODIUM: 140 mmol/L (ref 135–145)

## 2015-09-30 LAB — HEPARIN LEVEL (UNFRACTIONATED): Heparin Unfractionated: 0.12 IU/mL — ABNORMAL LOW (ref 0.30–0.70)

## 2015-09-30 LAB — MAGNESIUM: Magnesium: 2 mg/dL (ref 1.7–2.4)

## 2015-09-30 LAB — PROTIME-INR
INR: 1.99
PROTHROMBIN TIME: 22.9 s — AB (ref 11.4–15.2)

## 2015-09-30 SURGERY — ECHOCARDIOGRAM, TRANSESOPHAGEAL
Anesthesia: Monitor Anesthesia Care

## 2015-09-30 MED ORDER — DOFETILIDE 250 MCG PO CAPS
500.0000 ug | ORAL_CAPSULE | Freq: Two times a day (BID) | ORAL | Status: DC
  Administered 2015-09-30: 500 ug via ORAL
  Filled 2015-09-30: qty 2

## 2015-09-30 MED ORDER — SODIUM CHLORIDE 0.9% FLUSH: 3.0000 mL | Freq: Two times a day (BID) | INTRAVENOUS | Status: DC

## 2015-09-30 MED ORDER — DOFETILIDE 250 MCG PO CAPS
500.0000 ug | ORAL_CAPSULE | Freq: Two times a day (BID) | ORAL | Status: DC
Start: 2015-10-02 — End: 2015-10-01

## 2015-09-30 MED ORDER — DOFETILIDE 250 MCG PO CAPS
500.0000 ug | ORAL_CAPSULE | Freq: Two times a day (BID) | ORAL | Status: DC
  Administered 2015-10-01: 500 ug via ORAL
  Filled 2015-09-30: qty 2

## 2015-09-30 MED ORDER — DOFETILIDE 250 MCG PO CAPS: 500.0000 ug | ORAL_CAPSULE | Freq: Two times a day (BID) | ORAL | Status: DC

## 2015-09-30 MED ORDER — METOPROLOL SUCCINATE ER 100 MG PO TB24
100.0000 mg | ORAL_TABLET | Freq: Every day | ORAL | Status: DC
  Administered 2015-09-30 – 2015-10-04 (×5): 100 mg via ORAL
  Filled 2015-09-30 (×5): qty 1

## 2015-09-30 MED ORDER — SODIUM CHLORIDE 0.9% FLUSH: 3.0000 mL | INTRAVENOUS | Status: DC | PRN

## 2015-09-30 MED ORDER — HEPARIN (PORCINE) IN NACL 100-0.45 UNIT/ML-% IJ SOLN
1450.0000 [IU]/h | INTRAMUSCULAR | Status: DC
  Administered 2015-09-30: 1250 [IU]/h via INTRAVENOUS
  Administered 2015-10-01 (×2): 1450 [IU]/h via INTRAVENOUS
  Filled 2015-09-30 (×2): qty 250

## 2015-09-30 MED ORDER — PROPOFOL 500 MG/50ML IV EMUL
INTRAVENOUS | Status: DC | PRN
  Administered 2015-09-30: 100 ug/kg/min via INTRAVENOUS

## 2015-09-30 MED ORDER — SODIUM CHLORIDE 0.9 % IV SOLN: 250.0000 mL | INTRAVENOUS | Status: DC | PRN

## 2015-09-30 MED ORDER — BUTAMBEN-TETRACAINE-BENZOCAINE 2-2-14 % EX AERO
INHALATION_SPRAY | CUTANEOUS | Status: DC | PRN
Start: 2015-09-30 — End: 2015-09-30
  Administered 2015-09-30: 2 via TOPICAL

## 2015-09-30 NOTE — Telephone Encounter (Signed)
Spoke with staff at [facility] and advised staff that patient is on both warfarin and plavix.

## 2015-09-30 NOTE — Progress Notes (Signed)
[facility] for warfarin/Heparin Indication: atrial fibrillation  Patient Measurements: Height: 5\' 4"  (162.6 cm) Weight: 278 lb 11.2 oz (126.4 kg) IBW/kg (Calculated) : 54.7  Heparin dosing weight 86 kg  Vital Signs: Temp: 97.5 F (36.4 C) (08/30 1428) Temp Source: Oral (08/30 1428) BP: 119/91 (08/30 1428) Pulse Rate: 71 (08/30 1428)   Assessment: 70yo morbidly obese female on warfarin 5mg  daily exc for 7.5mg  on Wed/Sun PTA for Afib. INR was therapeutic on admit at 2.3. Last dose was on 8/29. CHADsVASC = 4. INR is down to 1.99 today. No CBC on admit, but no s/s of bleed. Pharmacy consulted to bridge with heparin since INR < 2.  Heparin level is subtherapeutic this evening at 0.12. RN endorses some bleeding at IV site but it has not worsened and is minimal. Will increase heparin and continue to monitor.  Goal of Therapy:  INR 2-3 Heparin level 0.3-0.7 units/ml   Plan:  Increase heparin gtt to 1450 units/hr with no bolus Check 8 hr HL Monitor daily HL / INR, CBC, s/s of bleed  Thank you for allowing [place] to participate in this patients care. Jens Som, PharmD Pager: [phone] 09/30/2015 5:54 PM

## 2015-09-30 NOTE — Progress Notes (Signed)
Per insurance check for [employer] [facility] @ [place] RX # [phone]   6. TIKOSYN  500 MCG BID ( 30 )  NONE FORMULARY  PRIOR APPROVAL - YES # [id number]   2. DOFETILIDE 500 MCG BID ( 30 )  COVER- YES  CO-PAY- $ 250.00  60 TAB  TIER- 4 DRUG  PRIOR APPROVAL-NO  PHARMACY : [facility].   PATIENT HAS NOT MET HER DEDUCTILBES $155.00

## 2015-09-30 NOTE — Progress Notes (Signed)
  Echocardiogram Echocardiogram Transesophageal has been performed.  [name] M 09/30/2015, 10:57 AM

## 2015-09-30 NOTE — Progress Notes (Signed)
SUBJECTIVE: The patient is doing well today.  At this time, she denies chest pain, shortness of breath, or any new concerns.  Doug Sou Hold] atorvastatin  80 mg Oral q1800  . [MAR Hold] benazepril  10 mg Oral Daily  . [MAR Hold] clopidogrel  75 mg Oral Q breakfast  . [MAR Hold] furosemide  20 mg Oral Daily  . [MAR Hold] isosorbide mononitrate  30 mg Oral Daily  . [MAR Hold] levothyroxine  50 mcg Oral QAC breakfast  . [MAR Hold] metoprolol succinate  100 mg Oral UD  . [MAR Hold] sodium chloride flush  3 mL Intravenous Q12H  . [MAR Hold] warfarin  5 mg Oral Once per day on Mon Tue Thu Fri Sat  . [MAR Hold] warfarin  7.5 mg Oral Once per day on Sun Wed  . [MAR Hold] Warfarin - Pharmacist Dosing Inpatient   Does not apply q1800   . sodium chloride Stopped (09/30/15 0030)  . heparin 1,250 Units/hr (09/30/15 0855)    OBJECTIVE: Physical Exam: Vitals:   09/29/15 1743 09/29/15 2049 09/30/15 0244 09/30/15 0923  BP:  135/77 129/77 100/75  Pulse:  73 92 80  Resp:  17 17 18   Temp: 97.9 F (36.6 C) 97.6 F (36.4 C) 98 F (36.7 C) 97.4 F (36.3 C)  TempSrc: Oral Oral Oral Oral  SpO2:  96% 96% 99%  Weight:      Height:        Intake/Output Summary (Last 24 hours) at 09/30/15 1016 Last data filed at 09/30/15 0753  Gross per 24 hour  Intake                0 ml  Output              600 ml  Net             -600 ml    Telemetry reveals AFib, CVR  GEN- The patient is well appearing, alert and oriented x 3 today.   Head- normocephalic, atraumatic Eyes-  Sclera clear, conjunctiva pink Ears- hearing intact Oropharynx- clear Neck- supple, no JVP Lungs- Clear to ausculation bilaterally, normal work of breathing Heart- IRRR, no significant murmurs, no rubs or gallops GI- soft, NT, ND Extremities- no clubbing, cyanosis, or edema Skin- no rash or lesion Psych- euthymic mood, full affect Neuro- no gross deficits appreciated  LABS: Basic Metabolic Panel:  Recent Labs   09/29/15 1034 09/30/15 0240  NA 141 140  K 3.8 4.0  CL 106 108  CO2 28 25  GLUCOSE 100* 103*  BUN 13 15  CREATININE 1.07* 0.94  CALCIUM 9.0 8.3*  MG 2.2 2.0    ASSESSMENT AND PLAN:  1.  Persistent atrial fibrillation The patient has symptomatic persistent atrial fibrillation.   She had therapeutic INR's for the last 2 weeks, INR this morning 1.99, [name] get her started on heparin gtt TEE this morning Continue Warfarin for CHADS2VASC of 4 - pharmacy to continue to dose to stop heparin gtt when INR >/= 2.0 K+ 4.0 Mag 2.0 Creat 0.94 QTc 455ms To start Tikosyn post TEE if no thrombus  2. Morbid obesity Weight loss encouraged Our ability to maintain SR is reduced with obesity  3.  CAD No recent ischemic symptoms  Continue current therapy   4.  HTN Stable No change required today  Ariel Standard, PA-C 09/30/2015 10:16 AM  I have seen and examined this patient with [name].  Agree with above, note added to  reflect my findings.  On exam, irregular rhythm, no murmurs, lungs clear.  Plan for TEE today to rule out LAA thrombus prior to tikosyn.  [name] put on heparin as INR low today.  [name] plan for tikosyn after TEE if no clot.  [name] M. Ariel Yore MD 09/30/2015 1:53 PM

## 2015-09-30 NOTE — Interval H&P Note (Signed)
History and Physical Interval Note:  09/30/2015 8:16 AM  [name]  has presented today for surgery, with the diagnosis of atrial fibrillation  The various methods of treatment have been discussed with the patient and family. After consideration of risks, benefits and other options for treatment, the patient has consented to  Procedure(s): TRANSESOPHAGEAL ECHOCARDIOGRAM (TEE) (N/A) as a surgical intervention .  The patient's history has been reviewed, patient examined, no change in status, stable for surgery.  I have reviewed the patient's chart and labs.  Questions were answered to the patient's satisfaction.     Skeet Latch, MD

## 2015-09-30 NOTE — Progress Notes (Signed)
TEE with no LA/LAA thrombus, will proceed with Tikosyn 549mcg PO BID, (Calc Cr. Cl is 111).  Discussed with pharmacy, we will back up the time of each following dose by 1 hour to a goal of 0800 and 2000.   Tommye Standard, PA-C  Allegra Lai, MD

## 2015-09-30 NOTE — Anesthesia Postprocedure Evaluation (Signed)
Anesthesia Post Note  Patient: [name]  Procedure(s) Performed: Procedure(s) (LRB): TRANSESOPHAGEAL ECHOCARDIOGRAM (TEE) (N/A)  Patient location during evaluation: PACU Anesthesia Type: MAC Level of consciousness: awake and alert Pain management: pain level controlled Vital Signs Assessment: post-procedure vital signs reviewed and stable Respiratory status: spontaneous breathing, nonlabored ventilation, respiratory function stable and patient connected to nasal cannula oxygen Cardiovascular status: stable and blood pressure returned to baseline Anesthetic complications: no    Last Vitals:  Vitals:   09/30/15 0244 09/30/15 0923  BP: 129/77 100/75  Pulse: 92 80  Resp: 17 18  Temp: 36.7 C 36.3 C    Last Pain:  Vitals:   09/30/15 0923  TempSrc: Oral                 Lequan Dobratz S

## 2015-09-30 NOTE — Transfer of Care (Signed)
Immediate Anesthesia Transfer of Care Note  Patient: [name]  Procedure(s) Performed: Procedure(s): TRANSESOPHAGEAL ECHOCARDIOGRAM (TEE) (N/A)  Patient Location: PACU  Anesthesia Type:MAC  Level of Consciousness: patient cooperative and responds to stimulation  Airway & Oxygen Therapy: Patient Spontanous Breathing and Patient connected to nasal cannula oxygen  Post-op Assessment: Report given to RN and Post -op Vital signs reviewed and stable  Post vital signs: Reviewed and stable  Last Vitals:  Vitals:   09/30/15 0244 09/30/15 0923  BP: 129/77 100/75  Pulse: 92 80  Resp: 17 18  Temp: 36.7 C 36.3 C    Last Pain:  Vitals:   09/30/15 0923  TempSrc: Oral         Complications: No apparent anesthesia complications

## 2015-09-30 NOTE — Progress Notes (Signed)
ANTICOAGULATION CONSULT NOTE - Initial Consult  Pharmacy Consult for Coumadin / Heparin Indication: atrial fibrillation  Patient Measurements: Height: 5\' 4"  (162.6 cm) Weight: 278 lb 11.2 oz (126.4 kg) IBW/kg (Calculated) : 54.7  Heparin dosing weight 86 kg  Vital Signs: Temp: 98 F (36.7 C) (08/30 0244) Temp Source: Oral (08/30 0244) BP: 129/77 (08/30 0244) Pulse Rate: 92 (08/30 0244)   Assessment: 70yo morbidly obese female on Coumadin 5mg  daily exc for 7.5mg  on Wed/Sun PTA for Afib. INR was therapeutic on admit at 2.3. Last dose was on 8/29. CHADsVASC = 4. INR is down to 1.99 today. No CBC on admit, but no s/s of bleed. Pharmacy consulted to bridge with heparin since INR < 2.   Goal of Therapy:  INR 2-3   Plan:  Give Coumadin 7.5mg  PO x 1 tonight Start heparin gtt at 1,250 units/hr with no bolus Check 8 hr HL Monitor daily HL / INR, CBC, s/s of bleed  Elenor Quinones, PharmD, BCPS Clinical Pharmacist Pager [phone] 09/30/2015 8:16 AM

## 2015-09-30 NOTE — Care Management Note (Signed)
Case Management Note Marvetta Gibbons RN, BSN Unit 2W-Case Manager [phone]  Patient Details  Name: [name] MRN: [id number] Date of Birth: July 12, 1945  Subjective/Objective:    Pt admitted with afib for Tikosyn load                Action/Plan: PTA pt lived at home- anticipate return home- referral received for Tikosyn assistance- per insurance check- [facility] @ [place] # [phone]   1. TIKOSYN  500 MCG BID ( 30 )  NONE FORMULARY  PRIOR APPROVAL - YES # [id number]   2. DOFETILIDE 500 MCG BID ( 30 )  COVER- YES  CO-PAY- $ 250.00  60 TAB  TIER- 4 DRUG  PRIOR APPROVAL-NO  PHARMACY : [facility].   PATIENT HAS NOT MET HER DEDUCTILBES $155.00   Spoke with pt at bedside- coverage info shared- per pt she uses [facility] on [address]- pt will need 7 day supply of Tikosyn from [facility] main [facility] on discharge will also need script for [facility] with refills.   Expected Discharge Date:                  Expected Discharge Plan:  Home/Self Care  In-House Referral:     Discharge planning Services  CM Consult, Medication Assistance  Post Acute Care Choice:    Choice offered to:     DME Arranged:    DME Agency:     HH Arranged:    HH Agency:     Status of Service:  Completed, signed off  If discussed at [employer] of Stay Meetings, dates discussed:    Additional Comments:  Dawayne Patricia, RN 09/30/2015, 4:59 PM

## 2015-09-30 NOTE — CV Procedure (Signed)
Brief TEE Note:  LVEF 50-55% Trivial MR and AR  No LA/LAA thrombus   For additional details see full report  Sedation with propofol via Anesthesia.  Dareld Mcauliffe C. Oval Linsey, MD, [facility]  09/30/2015  10:40 AM

## 2015-09-30 NOTE — Progress Notes (Signed)
Pharmacy Review for Dofetilide (Tikosyn) Initiation  Admit Complaint: 70 y.o. female admitted 09/29/2015 with atrial fibrillation to be initiated on dofetilide.   Assessment:  Patient Exclusion Criteria: If any screening criteria checked as "Yes", then  patient  should NOT receive dofetilide until criteria item is corrected. If "Yes" please indicate correction plan.  YES  NO Patient  Exclusion Criteria Correction Plan  []  [x]  Baseline QTc interval is greater than or equal to 440 msec. IF above YES box checked dofetilide contraindicated unless patient has ICD; then may proceed if QTc 500-550 msec or with known ventricular conduction abnormalities may proceed with QTc 550-600 msec. QTc = 413   []  [x]  Magnesium level is less than 1.8 mEq/l : Last magnesium:  Lab Results  Component Value Date   MG 2.0 09/30/2015         []  [x]  Potassium level is less than 4 mEq/l : Last potassium:  Lab Results  Component Value Date   K 4.0 09/30/2015         []  [x]  Patient is known or suspected to have a digoxin level greater than 2 ng/ml: No results found for: DIGOXIN    []  [x]  Creatinine clearance less than 20 ml/min (calculated using Cockcroft-Gault, actual body weight and serum creatinine): Estimated Creatinine Clearance: 73.3 mL/min (by C-G formula based on SCr of 0.94 mg/dL).    []  [x]  Patient has received drugs known to prolong the QT intervals within the last 48 hours (phenothiazines, tricyclics or tetracyclic antidepressants, erythromycin, H-1 antihistamines, cisapride, fluoroquinolones, azithromycin). Drugs not listed above may have an, as yet, undetected potential to prolong the QT interval, updated information on QT prolonging agents is available at this website:QT prolonging agents   []  [x]  Patient received a dose of hydrochlorothiazide (Oretic) alone or in any combination including triamterene (Dyazide, Maxzide) in the last 48 hours.   []  [x]  Patient received a medication known to increase  dofetilide plasma concentrations prior to initial dofetilide dose:  . Trimethoprim (Primsol, Proloprim) in the last 36 hours . Verapamil (Calan, Verelan) in the last 36 hours or a sustained release dose in the last 72 hours . Megestrol (Megace) in the last 5 days  . Cimetidine (Tagamet) in the last 6 hours . Ketoconazole (Nizoral) in the last 24 hours . Itraconazole (Sporanox) in the last 48 hours  . Prochlorperazine (Compazine) in the last 36 hours    []  [x]  Patient is known to have a history of torsades de pointes; congenital or acquired long QT syndromes.   []  [x]  Patient has received a Class 1 antiarrhythmic with less than 2 half-lives since last dose. (Disopyramide, Quinidine, Procainamide, Lidocaine, Mexiletine, Flecainide, Propafenone)   []  [x]  Patient has received amiodarone therapy in the past 3 months or amiodarone level is greater than 0.3 ng/ml.    Patient has been appropriately anticoagulated with Coumadin.  Ordering provider was confirmed at [email or web address] if they are not listed on the [place] Prescribers list.  Goal of Therapy: Follow renal function, electrolytes, potential drug interactions, and dose adjustment. Provide education and 1 week supply at discharge.  Plan:  [x]   Physician selected initial dose within range recommended for patients level of renal function - will monitor for response.  []   Physician selected initial dose outside of range recommended for patients level of renal function - will discuss if the dose should be altered at this time.   Select One Calculated CrCl  Dose q12h  [x]  > 60 ml/min 500 mcg  []   40-60 ml/min 250 mcg  []  20-40 ml/min 125 mcg   2. Follow up QTc after the first 5 doses, renal function, electrolytes (K & Mg) daily x 3     days, dose adjustment, success of initiation and facilitate 1 week discharge supply as     clinically indicated.  3. Initiate Tikosyn education video (Call [phone] and ask for video # [phone]).  4.  Place Enrollment Form on the chart for discharge supply of dofetilide.   Elenor Quinones, PharmD, BCPS Clinical Pharmacist Pager [phone] 09/30/2015 12:25 PM

## 2015-09-30 NOTE — Anesthesia Preprocedure Evaluation (Signed)
Anesthesia Evaluation  Patient identified by MRN, date of birth, ID band Patient awake    Reviewed: Allergy & Precautions, NPO status , Patient's Chart, lab work & pertinent test results  Airway Mallampati: II  TM Distance: >3 FB Neck ROM: Full    Dental no notable dental hx.    Pulmonary neg pulmonary ROS,    breath sounds clear to auscultation + decreased breath sounds      Cardiovascular hypertension, + CAD and +CHF  Normal cardiovascular exam+ dysrhythmias Atrial Fibrillation  Rhythm:Regular Rate:Normal  a. Echo 12/16: Mild concentric LVH, EF 25-30%, anteroseptal akinesis, inferoseptal akinesis, anterior akinesis, trivial AI, mild MR, moderate LAE, trivial TR, trivial PI, PASP 33 mmHg  //  b. Echo 2/17: Mild LVH, EF 25-30%, diffuse HK, mild LAE  //  c. Echo 7/17: mild LVH, EF 50-55%, no RWMA, trivial AI, mild to mod LAE, mild RAE   Neuro/Psych negative neurological ROS  negative psych ROS   GI/Hepatic negative GI ROS, Neg liver ROS,   Endo/Other  Hypothyroidism Morbid obesity  Renal/GU negative Renal ROS  negative genitourinary   Musculoskeletal negative musculoskeletal ROS (+)   Abdominal   Peds negative pediatric ROS (+)  Hematology negative hematology ROS (+)   Anesthesia Other Findings   Reproductive/Obstetrics negative OB ROS                             Anesthesia Physical Anesthesia Plan  ASA: IV  Anesthesia Plan: MAC   Post-op Pain Management:    Induction: Intravenous  Airway Management Planned: Nasal Cannula  Additional Equipment:   Intra-op Plan:   Post-operative Plan:   Informed Consent: I have reviewed the patients History and Physical, chart, labs and discussed the procedure including the risks, benefits and alternatives for the proposed anesthesia with the patient or authorized representative who has indicated his/her understanding and acceptance.   Dental  advisory given  Plan Discussed with: CRNA and Surgeon  Anesthesia Plan Comments:         Anesthesia Quick Evaluation

## 2015-09-30 NOTE — Progress Notes (Signed)
Fluids were started around 0000. Patient requested that the fluids be stopped because the pump was making too much noise and that she could not sleep with it going. This RN stopped the fluids per patient request (see MAR). Will restart the fluids in AM once the patient is awake. Will continue to monitor.

## 2015-10-01 ENCOUNTER — Inpatient Hospital Stay (HOSPITAL_COMMUNITY): Payer: Medicare Other

## 2015-10-01 LAB — CBC
HEMATOCRIT: 41.9 % (ref 36.0–46.0)
Hemoglobin: 13.9 g/dL (ref 12.0–15.0)
MCH: 30.6 pg (ref 26.0–34.0)
MCHC: 33.2 g/dL (ref 30.0–36.0)
MCV: 92.3 fL (ref 78.0–100.0)
Platelets: 481 10*3/uL — ABNORMAL HIGH (ref 150–400)
RBC: 4.54 MIL/uL (ref 3.87–5.11)
RDW: 15.8 % — AB (ref 11.5–15.5)
WBC: 9.7 10*3/uL (ref 4.0–10.5)

## 2015-10-01 LAB — BASIC METABOLIC PANEL
Anion gap: 6 (ref 5–15)
BUN: 13 mg/dL (ref 6–20)
CO2: 24 mmol/L (ref 22–32)
Calcium: 8 mg/dL — ABNORMAL LOW (ref 8.9–10.3)
Chloride: 108 mmol/L (ref 101–111)
Creatinine, Ser: 0.85 mg/dL (ref 0.44–1.00)
GFR calc Af Amer: >60 mL/min (ref >60)
GFR calc non Af Amer: >60 mL/min (ref >60)
GLUCOSE: 102 mg/dL — AB (ref 65–99)
POTASSIUM: 3.9 mmol/L (ref 3.5–5.1)
SODIUM: 138 mmol/L (ref 135–145)

## 2015-10-01 LAB — PROTIME-INR
INR: 1.93
Prothrombin Time: 22.4 seconds — ABNORMAL HIGH (ref 11.4–15.2)

## 2015-10-01 LAB — MAGNESIUM: MAGNESIUM: 2 mg/dL (ref 1.7–2.4)

## 2015-10-01 LAB — HEPARIN LEVEL (UNFRACTIONATED)
HEPARIN UNFRACTIONATED: 0.51 [IU]/mL (ref 0.30–0.70)
Heparin Unfractionated: 0.42 IU/mL (ref 0.30–0.70)
Heparin Unfractionated: 0.51 IU/mL (ref 0.30–0.70)

## 2015-10-01 MED ORDER — POTASSIUM CHLORIDE CRYS ER 20 MEQ PO TBCR
20.0000 meq | EXTENDED_RELEASE_TABLET | Freq: Once | ORAL | Status: AC
  Administered 2015-10-01: 20 meq via ORAL
  Filled 2015-10-01: qty 1

## 2015-10-01 MED ORDER — WARFARIN SODIUM 7.5 MG PO TABS
7.5000 mg | ORAL_TABLET | Freq: Once | ORAL | Status: AC
  Administered 2015-10-01: 7.5 mg via ORAL
  Filled 2015-10-01: qty 1

## 2015-10-01 MED ORDER — WARFARIN SODIUM 5 MG PO TABS: 5.0000 mg | ORAL_TABLET | ORAL | Status: DC

## 2015-10-01 MED ORDER — FUROSEMIDE 20 MG PO TABS
20.0000 mg | ORAL_TABLET | Freq: Once | ORAL | Status: AC
  Administered 2015-10-01: 20 mg via ORAL
  Filled 2015-10-01: qty 1

## 2015-10-01 NOTE — Progress Notes (Addendum)
Patient with c/o feeling some mild SOB, lung exam is clear b/l, sats 98-100% on RA, in NAD, RR 20, VSS, no edema, pt did not get her lasix yesterday, will give an additional 20mg  today.  Patient prefers PO.  Discussed co-pay for Sotalol, the patient says this will be affordable, and will plan to start tomorrow.  Ariel Standard, PA-C

## 2015-10-01 NOTE — Progress Notes (Signed)
ANTICOAGULATION CONSULT NOTE - Follow Up Consult  Pharmacy Consult for Heparin (while INR is <2)  Indication: atrial fibrillation  Allergies  Allergen Reactions  . Ceftriaxone Rash    "TTP"/Was hospitalized and in a coma for 60 days, per patient  . Levofloxacin Other (See Comments)    Dropped blood platelets extremely low  . Levofloxacin     Other reaction(s): Thrombocytopenia (intolerance)  . Oxycodone Itching  . Tape Other (See Comments)    Prefers paper or cloth tape  . Alendronate Rash  . Aspirin Other (See Comments)    Patient stated she is unable to take due to blood thinner  . Oxycodone-Acetaminophen Itching  . Oxycodone-Acetaminophen Itching  . Penicillin G Rash  . Penicillins Rash and Other (See Comments)    Cannot tolerate any "-CILLINS" Has patient had a PCN reaction causing immediate rash, facial/tongue/throat swelling, SOB or lightheadedness with hypotension: Yes Has patient had a PCN reaction causing severe rash involving mucus membranes or skin necrosis: No Has patient had a PCN reaction that required hospitalization No Has patient had a PCN reaction occurring within the last 10 years: Yes If all of the above answers are "NO", then may proceed with Cephalosporin use. Causes WELTS!!    Patient Measurements: Height: 5\' 4"  (162.6 cm) Weight: 278 lb 11.2 oz (126.4 kg) IBW/kg (Calculated) : 54.7  Vital Signs: Temp: 98.2 F (36.8 C) (08/30 2100) Temp Source: Oral (08/30 2100) BP: 132/73 (08/30 2100) Pulse Rate: 66 (08/30 2100)  Labs:  Recent Labs  09/29/15 0928 09/29/15 1034 09/30/15 0240 09/30/15 1653 10/01/15 0207  HGB  --   --   --   --  13.9  HCT  --   --   --   --  41.9  PLT  --   --   --   --  481*  LABPROT  --   --  22.9*  --  22.4*  INR 2.3  --  1.99  --  1.93  HEPARINUNFRC  --   --   --  0.12* 0.42  CREATININE  --  1.07* 0.94  --  0.85    Estimated Creatinine Clearance: 81.1 mL/min (by C-G formula based on SCr of 0.85  mg/dL).   Assessment: Heparin while INR is sub-therapeutic, INR 1.93 this AM, HL therapeutic x 1 after rate increase  Goal of Therapy:  Heparin level 0.3-0.7 units/ml Monitor platelets by anticoagulation protocol: Yes   Plan:  -Cont heparin at 1450 units/hr -1200 HL  Narda Bonds 10/01/2015,3:24 AM

## 2015-10-01 NOTE — Progress Notes (Signed)
SUBJECTIVE: The patient is doing well today.  At this time, she denies chest pain, shortness of breath, or any new concerns.  She does mention though that she just realized, once the new year comes around her Rx benefits will be readjusted to her deductible and while she could afford at the higher coverage amount, she will not be able to afford it in the first several months of the year and would like to try a different medicine.  Marland Kitchen atorvastatin  80 mg Oral q1800  . benazepril  10 mg Oral Daily  . clopidogrel  75 mg Oral Q breakfast  . furosemide  20 mg Oral Daily  . isosorbide mononitrate  30 mg Oral Daily  . levothyroxine  50 mcg Oral QAC breakfast  . metoprolol succinate  100 mg Oral Daily  . sodium chloride flush  3 mL Intravenous Q12H  . sodium chloride flush  3 mL Intravenous Q12H  . warfarin  5 mg Oral Once per day on Mon Tue Thu Fri Sat  . warfarin  7.5 mg Oral Once per day on Sun Wed  . Warfarin - Pharmacist Dosing Inpatient   Does not apply q1800   . heparin 1,450 Units/hr (10/01/15 0804)    OBJECTIVE: Physical Exam: Vitals:   09/30/15 1105 09/30/15 1428 09/30/15 2100 10/01/15 0426  BP: (!) 171/86 (!) 119/91 132/73 132/77  Pulse: 63 71 66 94  Resp: 14 18 18 20   Temp:  97.5 F (36.4 C) 98.2 F (36.8 C) 97.9 F (36.6 C)  TempSrc:  Oral Oral Oral  SpO2: 99% 98% 96% 98%  Weight:      Height:        Intake/Output Summary (Last 24 hours) at 10/01/15 0842 Last data filed at 09/30/15 1900  Gross per 24 hour  Intake          1191.04 ml  Output              300 ml  Net           891.04 ml    Telemetry reveals AFib, CVR  GEN- The patient is well appearing, alert and oriented x 3 today.   Head- normocephalic, atraumatic Eyes-  Sclera clear, conjunctiva pink Ears- hearing intact Oropharynx- clear Neck- supple, no JVP Lungs- Clear to ausculation bilaterally, normal work of breathing Heart- IRRR, no significant murmurs, no rubs or gallops GI- soft, NT,  ND Extremities- no clubbing, cyanosis, or edema Skin- no rash or lesion Psych- euthymic mood, full affect Neuro- no gross deficits appreciated  LABS: Basic Metabolic Panel:  Recent Labs  09/30/15 0240 10/01/15 0207  NA 140 138  K 4.0 3.9  CL 108 108  CO2 25 24  GLUCOSE 103* 102*  BUN 15 13  CREATININE 0.94 0.85  CALCIUM 8.3* 8.0*  MG 2.0 2.0    ASSESSMENT AND PLAN:  1.  Persistent atrial fibrillation The patient has symptomatic persistent atrial fibrillation.   Continue Warfarin for CHADS2VASC of 4 - pharmacy to continue to dose to stop heparin gtt when INR >/= 2.0 K+ 3.9 Mag 2.0 Creat 0.85 (Calc Cr. Cl 123) QTc 555ms  Given new financial concerns, the patient does not want to continue with Tikosyn, she has concerns about potential side effects of amiodarone, so we will ask case management to her cost for Sotalol, discussed with pharmacy, recommended to wait 24 hours given she only received 2 doses and not to steady state.   2. Morbid obesity Weight loss  encouraged Our ability to maintain SR is reduced with obesity  3.  CAD No recent ischemic symptoms  Continue current therapy   4.  HTN Stable No change required today  Tommye Standard, PA-C 10/01/2015 8:42 AM  I have seen and examined this patient with Tommye Standard.  Agree with above, note added to reflect my findings.  On exam, irregular rhythm, no murmurs, lungs clear. Had TEE performed yesterday which showed no evidence of clot in the left atrium. Was started on dofetilide, but patient is concerned about her being able to afford the medication. We'll therefore potentially switch her to sotalol or amiodarone depending on the price of sotalol.    Will M. Camnitz MD 10/01/2015 9:05 AM

## 2015-10-01 NOTE — Progress Notes (Signed)
Insurance check completed for Sotalol Sotalol 80 mg bid--Brand is not covered   Pt. Co pay  $7.55 for generic.- And no Auth.req.

## 2015-10-01 NOTE — Progress Notes (Signed)
ANTICOAGULATION CONSULT NOTE - Follow Up Consult  Pharmacy Consult for Heparin Indication: atrial fibrillation  Allergies  Allergen Reactions  . Ceftriaxone Rash    "TTP"/Was hospitalized and in a coma for 60 days, per patient  . Levofloxacin Other (See Comments)    Dropped blood platelets extremely low  . Levofloxacin     Other reaction(s): Thrombocytopenia (intolerance)  . Oxycodone Itching  . Tape Other (See Comments)    Prefers paper or cloth tape  . Alendronate Rash  . Aspirin Other (See Comments)    Patient stated she is unable to take due to blood thinner  . Oxycodone-Acetaminophen Itching  . Oxycodone-Acetaminophen Itching  . Penicillin G Rash  . Penicillins Rash and Other (See Comments)    Cannot tolerate any "-CILLINS" Has patient had a PCN reaction causing immediate rash, facial/tongue/throat swelling, SOB or lightheadedness with hypotension: Yes Has patient had a PCN reaction causing severe rash involving mucus membranes or skin necrosis: No Has patient had a PCN reaction that required hospitalization No Has patient had a PCN reaction occurring within the last 10 years: Yes If all of the above answers are "NO", then may proceed with Cephalosporin use. Causes WELTS!!    Patient Measurements: Height: 5\' 4"  (162.6 cm) Weight: 278 lb 11.2 oz (126.4 kg) IBW/kg (Calculated) : 54.7  Vital Signs: Temp: 97.9 F (36.6 C) (08/31 2002) Temp Source: Oral (08/31 2002) BP: 131/58 (08/31 2002) Pulse Rate: 72 (08/31 2002)  Labs:  Recent Labs  09/29/15 0928 09/29/15 1034 09/30/15 0240  10/01/15 0207 10/01/15 1143 10/01/15 2043  HGB  --   --   --   --  13.9  --   --   HCT  --   --   --   --  41.9  --   --   PLT  --   --   --   --  481*  --   --   LABPROT  --   --  22.9*  --  22.4*  --   --   INR 2.3  --  1.99  --  1.93  --   --   HEPARINUNFRC  --   --   --   < > 0.42 0.51 0.51  CREATININE  --  1.07* 0.94  --  0.85  --   --   < > = values in this interval not  displayed.  Estimated Creatinine Clearance: 81.1 mL/min (by C-G formula based on SCr of 0.85 mg/dL).   Medications:  Scheduled:  . atorvastatin  80 mg Oral q1800  . benazepril  10 mg Oral Daily  . clopidogrel  75 mg Oral Q breakfast  . furosemide  20 mg Oral Daily  . isosorbide mononitrate  30 mg Oral Daily  . levothyroxine  50 mcg Oral QAC breakfast  . metoprolol succinate  100 mg Oral Daily  . sodium chloride flush  3 mL Intravenous Q12H  . sodium chloride flush  3 mL Intravenous Q12H  . [START ON 10/02/2015] warfarin  5 mg Oral Once per day on Mon Tue Thu Fri Sat  . warfarin  7.5 mg Oral Once per day on Sun Wed  . Warfarin - Pharmacist Dosing Inpatient   Does not apply q1800    Assessment: 70yo female on heparin bridge to Coumadin. Heparin level remains therapeutic at 0.51.  No bleeding problems noted.  Hg and pltc wnl.  Goal of Therapy:  INR 2-3 Heparin level 0.3-0.7 units/ml Monitor platelets by anticoagulation  protocol: Yes   Plan:  Continue heparin 1450 units/hr Monitor daily HL, CBC, s/s of bleed   Thank you for allowing [place] to participate in this patients care. Jens Som, PharmD Pager: [phone] 10/01/2015 9:24 PM

## 2015-10-01 NOTE — Progress Notes (Signed)
ANTICOAGULATION CONSULT NOTE - Follow Up Consult  Pharmacy Consult for Heparin and Coumadin Indication: atrial fibrillation  Allergies  Allergen Reactions  . Ceftriaxone Rash    "TTP"/Was hospitalized and in a coma for 60 days, per patient  . Levofloxacin Other (See Comments)    Dropped blood platelets extremely low  . Levofloxacin     Other reaction(s): Thrombocytopenia (intolerance)  . Oxycodone Itching  . Tape Other (See Comments)    Prefers paper or cloth tape  . Alendronate Rash  . Aspirin Other (See Comments)    Patient stated she is unable to take due to blood thinner  . Oxycodone-Acetaminophen Itching  . Oxycodone-Acetaminophen Itching  . Penicillin G Rash  . Penicillins Rash and Other (See Comments)    Cannot tolerate any "-CILLINS" Has patient had a PCN reaction causing immediate rash, facial/tongue/throat swelling, SOB or lightheadedness with hypotension: Yes Has patient had a PCN reaction causing severe rash involving mucus membranes or skin necrosis: No Has patient had a PCN reaction that required hospitalization No Has patient had a PCN reaction occurring within the last 10 years: Yes If all of the above answers are "NO", then may proceed with Cephalosporin use. Causes WELTS!!    Patient Measurements: Height: 5\' 4"  (162.6 cm) Weight: 278 lb 11.2 oz (126.4 kg) IBW/kg (Calculated) : 54.7  Vital Signs: Temp: 97.7 F (36.5 C) (08/31 0930) Temp Source: Oral (08/31 0930) BP: 138/82 (08/31 0930) Pulse Rate: 92 (08/31 0930)  Labs:  Recent Labs  09/29/15 0928 09/29/15 1034 09/30/15 0240 09/30/15 1653 10/01/15 0207 10/01/15 1143  HGB  --   --   --   --  13.9  --   HCT  --   --   --   --  41.9  --   PLT  --   --   --   --  481*  --   LABPROT  --   --  22.9*  --  22.4*  --   INR 2.3  --  1.99  --  1.93  --   HEPARINUNFRC  --   --   --  0.12* 0.42 0.51  CREATININE  --  1.07* 0.94  --  0.85  --     Estimated Creatinine Clearance: 81.1 mL/min (by C-G  formula based on SCr of 0.85 mg/dL).   Medications:  Scheduled:  . atorvastatin  80 mg Oral q1800  . benazepril  10 mg Oral Daily  . clopidogrel  75 mg Oral Q breakfast  . furosemide  20 mg Oral Daily  . isosorbide mononitrate  30 mg Oral Daily  . levothyroxine  50 mcg Oral QAC breakfast  . metoprolol succinate  100 mg Oral Daily  . sodium chloride flush  3 mL Intravenous Q12H  . sodium chloride flush  3 mL Intravenous Q12H  . warfarin  5 mg Oral Once per day on Mon Tue Thu Fri Sat  . warfarin  7.5 mg Oral Once per day on Sun Wed  . Warfarin - Pharmacist Dosing Inpatient   Does not apply q1800    Assessment: 70yo female on heparin bridge to Coumadin.  INR remains just below goal and Heparin level is therapeutic.  No bleeding problems noted.  Hg and pltc wnl.  Goal of Therapy:  INR 2-3 Heparin level 0.3-0.7 units/ml Monitor platelets by anticoagulation protocol: Yes   Plan:  Give Coumadin 7.5mg  PO x 1 tonight Cont heparin 1450 units/hr Repeat HL 6hr Monitor daily HL, CBC, s/s of  bleed  Gracy Bruins, PharmD Clinical Pharmacist [facility]

## 2015-10-02 LAB — PROTIME-INR
INR: 2.07
PROTHROMBIN TIME: 23.6 s — AB (ref 11.4–15.2)

## 2015-10-02 LAB — CBC
HCT: 42.1 % (ref 36.0–46.0)
HEMOGLOBIN: 13.4 g/dL (ref 12.0–15.0)
MCH: 29.4 pg (ref 26.0–34.0)
MCHC: 31.8 g/dL (ref 30.0–36.0)
MCV: 92.3 fL (ref 78.0–100.0)
PLATELETS: 469 10*3/uL — AB (ref 150–400)
RBC: 4.56 MIL/uL (ref 3.87–5.11)
RDW: 15.8 % — ABNORMAL HIGH (ref 11.5–15.5)
WBC: 10.9 10*3/uL — AB (ref 4.0–10.5)

## 2015-10-02 LAB — HEPARIN LEVEL (UNFRACTIONATED): HEPARIN UNFRACTIONATED: 0.62 [IU]/mL (ref 0.30–0.70)

## 2015-10-02 LAB — BASIC METABOLIC PANEL
ANION GAP: 6 (ref 5–15)
BUN: 10 mg/dL (ref 6–20)
CALCIUM: 8 mg/dL — AB (ref 8.9–10.3)
CO2: 29 mmol/L (ref 22–32)
CREATININE: 0.92 mg/dL (ref 0.44–1.00)
Chloride: 107 mmol/L (ref 101–111)
Glucose, Bld: 97 mg/dL (ref 65–99)
Potassium: 3.9 mmol/L (ref 3.5–5.1)
SODIUM: 142 mmol/L (ref 135–145)

## 2015-10-02 LAB — MAGNESIUM: MAGNESIUM: 2.2 mg/dL (ref 1.7–2.4)

## 2015-10-02 MED ORDER — WARFARIN SODIUM 7.5 MG PO TABS
7.5000 mg | ORAL_TABLET | Freq: Once | ORAL | Status: AC
  Administered 2015-10-02: 7.5 mg via ORAL
  Filled 2015-10-02: qty 1

## 2015-10-02 MED ORDER — TRAZODONE HCL 50 MG PO TABS
50.0000 mg | ORAL_TABLET | Freq: Once | ORAL | Status: AC
  Administered 2015-10-02: 50 mg via ORAL
  Filled 2015-10-02: qty 1

## 2015-10-02 MED ORDER — SOTALOL HCL 80 MG PO TABS
120.0000 mg | ORAL_TABLET | Freq: Two times a day (BID) | ORAL | Status: DC
  Administered 2015-10-02 – 2015-10-04 (×5): 120 mg via ORAL
  Filled 2015-10-02 (×5): qty 2

## 2015-10-02 MED ORDER — SODIUM CHLORIDE 0.9 % IV SOLN: 250.0000 mL | INTRAVENOUS | Status: DC

## 2015-10-02 MED ORDER — SODIUM CHLORIDE 0.9% FLUSH: 3.0000 mL | INTRAVENOUS | Status: DC | PRN

## 2015-10-02 MED ORDER — SODIUM CHLORIDE 0.9% FLUSH
3.0000 mL | Freq: Two times a day (BID) | INTRAVENOUS | Status: DC
  Administered 2015-10-02: 3 mL via INTRAVENOUS

## 2015-10-02 NOTE — Progress Notes (Signed)
SUBJECTIVE: The patient is doing well today.  At this time, she denies chest pain, no further SOB, no cough, or any new concerns.    Marland Kitchen atorvastatin  80 mg Oral q1800  . benazepril  10 mg Oral Daily  . clopidogrel  75 mg Oral Q breakfast  . furosemide  20 mg Oral Daily  . isosorbide mononitrate  30 mg Oral Daily  . levothyroxine  50 mcg Oral QAC breakfast  . metoprolol succinate  100 mg Oral Daily  . sodium chloride flush  3 mL Intravenous Q12H  . sodium chloride flush  3 mL Intravenous Q12H  . warfarin  5 mg Oral Once per day on Mon Tue Thu Fri Sat  . warfarin  7.5 mg Oral Once per day on Sun Wed  . Warfarin - Pharmacist Dosing Inpatient   Does not apply q1800   . heparin 1,450 Units/hr (10/01/15 0804)    OBJECTIVE: Physical Exam: Vitals:   10/01/15 0930 10/01/15 1356 10/01/15 2002 10/02/15 0516  BP: 138/82 129/67 (!) 131/58 128/89  Pulse: 92 66 72 74  Resp:  18 18 18   Temp: 97.7 F (36.5 C) 97.7 F (36.5 C) 97.9 F (36.6 C) 98.4 F (36.9 C)  TempSrc: Oral Oral Oral Oral  SpO2: 97% 97% 97% 96%  Weight:      Height:        Intake/Output Summary (Last 24 hours) at 10/02/15 0756 Last data filed at 10/01/15 2121  Gross per 24 hour  Intake              120 ml  Output                0 ml  Net              120 ml    Telemetry reveals AFib, CVR  GEN- The patient is well appearing, alert and oriented x 3 today.   Head- normocephalic, atraumatic Eyes-  Sclera clear, conjunctiva pink Ears- hearing intact Oropharynx- clear Neck- supple, no JVP Lungs- Clear to ausculation bilaterally, normal work of breathing Heart- IRRR, no significant murmurs, no rubs or gallops GI- soft, NT, ND Extremities- no clubbing, cyanosis, or edema Skin- no rash or lesion Psych- euthymic mood, full affect Neuro- no gross deficits appreciated  LABS: Basic Metabolic Panel:  Recent Labs  10/01/15 0207 10/02/15 0515  NA 138 142  K 3.9 3.9  CL 108 107  CO2 24 29  GLUCOSE 102* 97    BUN 13 10  CREATININE 0.85 0.92  CALCIUM 8.0* 8.0*  MG 2.0 2.2   09/30/15: TEE Study Conclusions - Left ventricle: Systolic function was normal. The estimated   ejection fraction was in the range of 50% to 55%. Wall motion was   normal; there were no regional wall motion abnormalities. - Aortic valve: There was mild regurgitation. - Mitral valve: There was mild regurgitation, with multiple jets   directed centrally. - Left atrium: No evidence of thrombus in the atrial cavity or   appendage. No evidence of thrombus in the atrial cavity or   appendage. - Right atrium: No evidence of thrombus in the atrial cavity or   appendage. - Atrial septum: No defect or patent foramen ovale was identified   by color flow Doppler or saline microcavitation study.    ASSESSMENT AND PLAN:  1.  Persistent atrial fibrillation The patient has symptomatic persistent atrial fibrillation.   Continue Warfarin for CHADS2VASC of 4 - pharmacy to continue  to dose INR today is 2.07 to stop heparin gtt  Creat 0.92, GFR >60 EKG this morning with QTc 438, last dose of Tikosyn was 0059, yesterday 10/01/15  Given new financial concerns, the patient did not feel like she could continue with Tikosyn, she has concerns about potential side effects of amiodarone, Sotalol co-pay is reasonable for her, will begin today. Will try to get DCCV done today  2. Morbid obesity Weight loss encouraged Our ability to maintain SR is reduced with obesity  3.  CAD No recent ischemic symptoms  Continue current therapy   4.  HTN Stable No change required today  5. SOB is resolved     S/p additional dose lasix yesterday, is resolved     CXR noted, no symptoms of illness, afebrile, no cough, no wheezing   Tommye Standard, PA-C 10/02/2015 7:56 AM   Hx reviewed   THis is first episode of a fib in years.  I had thought initially about just cardioverting and skipping AADs but she relates taht shedailed her first drug free  cardioversion  She ate breakfast, so will try this afternon or tomorrow with plans to discharge on saturday

## 2015-10-02 NOTE — Progress Notes (Addendum)
ANTICOAGULATION CONSULT NOTE - Follow Up Consult  Pharmacy Consult for Heparin / Coumadin Indication: atrial fibrillation  Allergies  Allergen Reactions  . Ceftriaxone Rash    "TTP"/Was hospitalized and in a coma for 60 days, per patient  . Levofloxacin Other (See Comments)    Dropped blood platelets extremely low  . Levofloxacin     Other reaction(s): Thrombocytopenia (intolerance)  . Oxycodone Itching  . Tape Other (See Comments)    Prefers paper or cloth tape  . Alendronate Rash  . Aspirin Other (See Comments)    Patient stated she is unable to take due to blood thinner  . Oxycodone-Acetaminophen Itching  . Oxycodone-Acetaminophen Itching  . Penicillin G Rash  . Penicillins Rash and Other (See Comments)    Cannot tolerate any "-CILLINS" Has patient had a PCN reaction causing immediate rash, facial/tongue/throat swelling, SOB or lightheadedness with hypotension: Yes Has patient had a PCN reaction causing severe rash involving mucus membranes or skin necrosis: No Has patient had a PCN reaction that required hospitalization No Has patient had a PCN reaction occurring within the last 10 years: Yes If all of the above answers are "NO", then may proceed with Cephalosporin use. Causes WELTS!!    Patient Measurements: Height: 5\' 4"  (162.6 cm) Weight: 278 lb 11.2 oz (126.4 kg) IBW/kg (Calculated) : 54.7  Vital Signs: Temp: 98.4 F (36.9 C) (09/01 0516) Temp Source: Oral (09/01 0516) BP: 128/89 (09/01 0516) Pulse Rate: 74 (09/01 0516)  Labs:  Recent Labs  09/30/15 0240  10/01/15 0207 10/01/15 1143 10/01/15 2043 10/02/15 0515  HGB  --   --  13.9  --   --  13.4  HCT  --   --  41.9  --   --  42.1  PLT  --   --  481*  --   --  469*  LABPROT 22.9*  --  22.4*  --   --  23.6*  INR 1.99  --  1.93  --   --  2.07  HEPARINUNFRC  --   < > 0.42 0.51 0.51 0.62  CREATININE 0.94  --  0.85  --   --  0.92  < > = values in this interval not displayed.  Estimated Creatinine  Clearance: 74.9 mL/min (by C-G formula based on SCr of 0.92 mg/dL).   Medications:  Scheduled:  . atorvastatin  80 mg Oral q1800  . benazepril  10 mg Oral Daily  . clopidogrel  75 mg Oral Q breakfast  . furosemide  20 mg Oral Daily  . isosorbide mononitrate  30 mg Oral Daily  . levothyroxine  50 mcg Oral QAC breakfast  . metoprolol succinate  100 mg Oral Daily  . sodium chloride flush  3 mL Intravenous Q12H  . sodium chloride flush  3 mL Intravenous Q12H  . warfarin  5 mg Oral Once per day on Mon Tue Thu Fri Sat  . warfarin  7.5 mg Oral Once per day on Sun Wed  . Warfarin - Pharmacist Dosing Inpatient   Does not apply q1800    Assessment: 70yo female on heparin bridge to Coumadin. Heparin level remains therapeutic.  No bleeding problems noted.  Hg and pltc wnl.  INR therapeutic at 2.07  Goal of Therapy:  INR 2-3 Heparin level 0.3-0.7 units/ml Monitor platelets by anticoagulation protocol: Yes   Plan:  Continue heparin 1450 units/hr Coumadin 7.5 mg po x 1 tonight Follow up AM labs  Thank you Anette Guarneri, PharmD [phone] 10/02/2015 8:24 AM

## 2015-10-02 NOTE — Care Management Important Message (Signed)
Important Message  Patient Details  Name: [name] MRN: [id number] Date of Birth: 1945-06-05   Medicare Important Message Given:  Yes    Jeananne Bedwell Abena 10/02/2015, 10:03 AM

## 2015-10-03 ENCOUNTER — Inpatient Hospital Stay (HOSPITAL_COMMUNITY): Payer: Medicare Other | Admitting: Anesthesiology

## 2015-10-03 ENCOUNTER — Encounter (HOSPITAL_COMMUNITY)
Admission: AD | Disposition: A | Discharge status: Home / Self Care | Payer: Self-pay | Source: Ambulatory Visit | Attending: Cardiology

## 2015-10-03 DIAGNOSIS — I4891 Unspecified atrial fibrillation: Secondary | ICD-10-CM

## 2015-10-03 HISTORY — PX: CARDIOVERSION: SHX1299

## 2015-10-03 LAB — CBC
HEMATOCRIT: 43.2 % (ref 36.0–46.0)
Hemoglobin: 13.6 g/dL (ref 12.0–15.0)
MCH: 29.3 pg (ref 26.0–34.0)
MCHC: 31.5 g/dL (ref 30.0–36.0)
MCV: 93.1 fL (ref 78.0–100.0)
Platelets: 452 10*3/uL — ABNORMAL HIGH (ref 150–400)
RBC: 4.64 MIL/uL (ref 3.87–5.11)
RDW: 16 % — ABNORMAL HIGH (ref 11.5–15.5)
WBC: 9.6 10*3/uL (ref 4.0–10.5)

## 2015-10-03 LAB — BASIC METABOLIC PANEL
ANION GAP: 4 — AB (ref 5–15)
BUN: 11 mg/dL (ref 6–20)
CHLORIDE: 108 mmol/L (ref 101–111)
CO2: 25 mmol/L (ref 22–32)
Calcium: 7.9 mg/dL — ABNORMAL LOW (ref 8.9–10.3)
Creatinine, Ser: 0.94 mg/dL (ref 0.44–1.00)
GFR calc Af Amer: >60 mL/min (ref >60)
Glucose, Bld: 102 mg/dL — ABNORMAL HIGH (ref 65–99)
POTASSIUM: 4.2 mmol/L (ref 3.5–5.1)
SODIUM: 137 mmol/L (ref 135–145)

## 2015-10-03 LAB — PROTIME-INR
INR: 2.23
INR: 2.34
PROTHROMBIN TIME: 26 s — AB (ref 11.4–15.2)
Prothrombin Time: 25.1 seconds — ABNORMAL HIGH (ref 11.4–15.2)

## 2015-10-03 SURGERY — CARDIOVERSION
Anesthesia: General

## 2015-10-03 MED ORDER — POTASSIUM CHLORIDE CRYS ER 20 MEQ PO TBCR
40.0000 meq | EXTENDED_RELEASE_TABLET | Freq: Two times a day (BID) | ORAL | Status: AC
  Administered 2015-10-03 (×2): 40 meq via ORAL
  Filled 2015-10-03 (×2): qty 2

## 2015-10-03 MED ORDER — SODIUM CHLORIDE 0.9 % IV SOLN: INTRAVENOUS | Status: DC

## 2015-10-03 MED ORDER — SODIUM CHLORIDE 0.9 % IV SOLN: 250.0000 mL | INTRAVENOUS | Status: DC

## 2015-10-03 MED ORDER — SODIUM CHLORIDE 0.9% FLUSH
3.0000 mL | Freq: Two times a day (BID) | INTRAVENOUS | Status: DC
  Administered 2015-10-03: 3 mL via INTRAVENOUS

## 2015-10-03 MED ORDER — SODIUM CHLORIDE 0.9% FLUSH: 3.0000 mL | INTRAVENOUS | Status: DC | PRN

## 2015-10-03 MED ORDER — LIDOCAINE 2% (20 MG/ML) 5 ML SYRINGE
INTRAMUSCULAR | Status: DC | PRN
  Administered 2015-10-03: 100 mg via INTRAVENOUS

## 2015-10-03 MED ORDER — PROMETHAZINE HCL 25 MG/ML IJ SOLN: 6.2500 mg | INTRAMUSCULAR | Status: DC | PRN

## 2015-10-03 MED ORDER — LACTATED RINGERS IV SOLN
INTRAVENOUS | Status: DC | PRN
  Administered 2015-10-03: 09:00:00 via INTRAVENOUS

## 2015-10-03 MED ORDER — MUPIROCIN 2 % EX OINT
TOPICAL_OINTMENT | CUTANEOUS | Status: AC
  Filled 2015-10-03: qty 22

## 2015-10-03 MED ORDER — PROPOFOL 10 MG/ML IV BOLUS
INTRAVENOUS | Status: DC | PRN
  Administered 2015-10-03: 60 mg via INTRAVENOUS

## 2015-10-03 MED ORDER — WARFARIN SODIUM 5 MG PO TABS
5.0000 mg | ORAL_TABLET | Freq: Once | ORAL | Status: AC
  Administered 2015-10-03: 5 mg via ORAL
  Filled 2015-10-03: qty 1

## 2015-10-03 MED ORDER — HYDROMORPHONE HCL 1 MG/ML IJ SOLN: 0.2500 mg | INTRAMUSCULAR | Status: DC | PRN

## 2015-10-03 NOTE — Progress Notes (Signed)
SUBJECTIVE: The patient is doing well today.  At this time, she denies chest pain, no further SOB, no cough, or any new concerns.    Marland Kitchen atorvastatin  80 mg Oral q1800  . benazepril  10 mg Oral Daily  . clopidogrel  75 mg Oral Q breakfast  . furosemide  20 mg Oral Daily  . isosorbide mononitrate  30 mg Oral Daily  . levothyroxine  50 mcg Oral QAC breakfast  . metoprolol succinate  100 mg Oral Daily  . sodium chloride flush  3 mL Intravenous Q12H  . sodium chloride flush  3 mL Intravenous Q12H  . sodium chloride flush  3 mL Intravenous Q12H  . sotalol  120 mg Oral Q12H  . Warfarin - Pharmacist Dosing Inpatient   Does not apply q1800   . sodium chloride      OBJECTIVE: Physical Exam: Vitals:   10/02/15 0950 10/02/15 1403 10/02/15 2025 10/03/15 0517  BP: 139/66 122/73 127/62 (!) 118/53  Pulse: 82 76 78 69  Resp:  18 18 18   Temp:  97.6 F (36.4 C) 98.3 F (36.8 C) 98.2 F (36.8 C)  TempSrc:  Oral Oral Oral  SpO2:  96% 98% 94%  Weight:      Height:        Intake/Output Summary (Last 24 hours) at 10/03/15 [id number] Last data filed at 10/02/15 1700  Gross per 24 hour  Intake              240 ml  Output                0 ml  Net              240 ml    Telemetry reveals AFib, CVR  GEN- The patient is well appearing, alert and oriented x 3 today.   Head- normocephalic, atraumatic Eyes-  Sclera clear, conjunctiva pink Ears- hearing intact Oropharynx- clear Neck- supple, no JVP Lungs- Clear to ausculation bilaterally, normal work of breathing Heart- IRRR, no significant murmurs, no rubs or gallops GI- soft, NT, ND Extremities- no clubbing, cyanosis, or edema Skin- no rash or lesion Psych- euthymic mood, full affect Neuro- no gross deficits appreciated  LABS: Basic Metabolic Panel:  Recent Labs  10/01/15 0207 10/02/15 0515  NA 138 142  K 3.9 3.9  CL 108 107  CO2 24 29  GLUCOSE 102* 97  BUN 13 10  CREATININE 0.85 0.92  CALCIUM 8.0* 8.0*  MG 2.0 2.2    09/30/15: TEE Study Conclusions - Left ventricle: Systolic function was normal. The estimated   ejection fraction was in the range of 50% to 55%. Wall motion was   normal; there were no regional wall motion abnormalities. - Aortic valve: There was mild regurgitation. - Mitral valve: There was mild regurgitation, with multiple jets   directed centrally. - Left atrium: No evidence of thrombus in the atrial cavity or   appendage. No evidence of thrombus in the atrial cavity or   appendage. - Right atrium: No evidence of thrombus in the atrial cavity or   appendage. - Atrial septum: No defect or patent foramen ovale was identified   by color flow Doppler or saline microcavitation study.  INR 2.33  ASSESSMENT AND PLAN:  1.  Persistent atrial fibrillation The patient has symptomatic persistent atrial fibrillation.   Continue Warfarin for CHADS2VASC of 4 - pharmacy to continue to dose INR today is 2.07 to stop heparin gtt     2.  Morbid obesity Weight loss encouraged Our ability to maintain SR is reduced with obesity  3.  CAD No recent ischemic symptoms  Continue current therapy   4.  HTN Stable No change required today  5. SOB is resolved     S/p additional dose lasix yesterday, is resolved     CXR noted, no symptoms of illness, afebrile, no cough, no wheezing   For DCCV this am Continue current meds Needs K repletion

## 2015-10-03 NOTE — Anesthesia Postprocedure Evaluation (Signed)
Anesthesia Post Note  Patient: [name]  Procedure(s) Performed: Procedure(s) (LRB): CARDIOVERSION (N/A)  Patient location during evaluation: PACU Anesthesia Type: General Level of consciousness: awake and awake and alert Pain management: pain level controlled Vital Signs Assessment: post-procedure vital signs reviewed and stable Respiratory status: spontaneous breathing and patient connected to nasal cannula oxygen Cardiovascular status: blood pressure returned to baseline and stable Anesthetic complications: no    Last Vitals:  Vitals:   10/02/15 2025 10/03/15 0517  BP: 127/62 (!) 118/53  Pulse: 78 69  Resp: 18 18  Temp: 36.8 C 36.8 C    Last Pain:  Vitals:   10/03/15 0517  TempSrc: Oral                 Awad Gladd

## 2015-10-03 NOTE — Transfer of Care (Signed)
Immediate Anesthesia Transfer of Care Note  Patient: [name]  Procedure(s) Performed: Procedure(s): CARDIOVERSION (N/A)  Patient Location: PACU  Anesthesia Type:General  Level of Consciousness: awake  Airway & Oxygen Therapy: Patient Spontanous Breathing and Patient connected to nasal cannula oxygen  Post-op Assessment: Report given to RN and Post -op Vital signs reviewed and stable  Post vital signs: Reviewed and stable  Last Vitals:  Vitals:   10/02/15 2025 10/03/15 0517  BP: 127/62 (!) 118/53  Pulse: 78 69  Resp: 18 18  Temp: 36.8 C 36.8 C    Last Pain:  Vitals:   10/03/15 0517  TempSrc: Oral         Complications: No apparent anesthesia complications

## 2015-10-03 NOTE — Anesthesia Preprocedure Evaluation (Signed)
Anesthesia Evaluation  Patient identified by MRN, date of birth, ID band Patient awake    Reviewed: Allergy & Precautions, NPO status , Patient's Chart, lab work & pertinent test results  Airway Mallampati: II  TM Distance: >3 FB Neck ROM: Full    Dental no notable dental hx.    Pulmonary neg pulmonary ROS,    breath sounds clear to auscultation + decreased breath sounds      Cardiovascular hypertension, + CAD and +CHF  Normal cardiovascular exam+ dysrhythmias Atrial Fibrillation  Rhythm:Regular Rate:Normal  a. Echo 12/16: Mild concentric LVH, EF 25-30%, anteroseptal akinesis, inferoseptal akinesis, anterior akinesis, trivial AI, mild MR, moderate LAE, trivial TR, trivial PI, PASP 33 mmHg  //  b. Echo 2/17: Mild LVH, EF 25-30%, diffuse HK, mild LAE  //  c. Echo 7/17: mild LVH, EF 50-55%, no RWMA, trivial AI, mild to mod LAE, mild RAE   Neuro/Psych PSYCHIATRIC DISORDERS Anxiety negative neurological ROS  negative psych ROS   GI/Hepatic negative GI ROS, Neg liver ROS,   Endo/Other  Hypothyroidism Morbid obesity  Renal/GU negative Renal ROS  negative genitourinary   Musculoskeletal negative musculoskeletal ROS (+)   Abdominal   Peds negative pediatric ROS (+)  Hematology negative hematology ROS (+)   Anesthesia Other Findings   Reproductive/Obstetrics negative OB ROS                             Anesthesia Physical  Anesthesia Plan  ASA: IV  Anesthesia Plan: General   Post-op Pain Management:    Induction: Intravenous  Airway Management Planned: Nasal Cannula  Additional Equipment:   Intra-op Plan:   Post-operative Plan:   Informed Consent: I have reviewed the patients History and Physical, chart, labs and discussed the procedure including the risks, benefits and alternatives for the proposed anesthesia with the patient or authorized representative who has indicated his/her  understanding and acceptance.   Dental advisory given  Plan Discussed with: CRNA and Surgeon  Anesthesia Plan Comments:         Anesthesia Quick Evaluation

## 2015-10-03 NOTE — CV Procedure (Signed)
     DIRECT CURRENT CARDIOVERSION  NAME:  [name]   MRN: [id number] DOB:  03-12-1945   ADMIT DATE: 09/29/2015   INDICATIONS: Atrial fibrillation    PROCEDURE:   Informed consent was obtained prior to the procedure. The risks, benefits and alternatives for the procedure were discussed and the patient comprehended these risks. Recent INR was 1.9 but she was promptly bridged with heparin and did not have a significant anticoagulation lapse. This was discussed with Dr. Caryl Comes.   Once an appropriate time out was taken, the patient had the defibrillator pads placed in the anterior and posterior position. The patient then underwent sedation by the anesthesia service. Once an appropriate level of sedation was achieved, the patient received a single biphasic, synchronized 200J shock with no response. I then held manual pressure on the anterior pad and a repeat 200J synchronized shock was delivered with prompt conversion to sinus rhythm. No apparent complications.   Talon Witting,MD 9:26 AM

## 2015-10-03 NOTE — Progress Notes (Signed)
ANTICOAGULATION CONSULT NOTE - Follow Up Consult  Pharmacy Consult for Coumadin Indication: atrial fibrillation  Assessment: 70yo female now s/p cardioversion this am and off heparin bridge. No bleeding problems noted. CBC stable  INR therapeutic at 2.3.  Goal of Therapy:  INR 2-3 Monitor platelets by anticoagulation protocol: Yes   Plan:  Coumadin 5mg  tonight with plans to transition back to home dosing. Follow up AM labs   Patient Measurements: Height: 5\' 4"  (162.6 cm) Weight: 278 lb 11.2 oz (126.4 kg) IBW/kg (Calculated) : 54.7  Vital Signs: Temp: 98.2 F (36.8 C) (09/02 0517) Temp Source: Oral (09/02 0517) BP: 132/63 (09/02 1034) Pulse Rate: 60 (09/02 1034)  Labs:  Recent Labs  10/01/15 0207 10/01/15 1143 10/01/15 2043 10/02/15 0515 10/03/15 0228 10/03/15 1004  HGB 13.9  --   --  13.4 13.6  --   HCT 41.9  --   --  42.1 43.2  --   PLT 481*  --   --  469* 452*  --   LABPROT 22.4*  --   --  23.6* 25.1* 26.0*  INR 1.93  --   --  2.07 2.23 2.34  HEPARINUNFRC 0.42 0.51 0.51 0.62  --   --   CREATININE 0.85  --   --  0.92  --   --     Estimated Creatinine Clearance: 74.9 mL/min (by C-G formula based on SCr of 0.92 mg/dL).   Thank you,  Erin Hearing PharmD., BCPS Clinical Pharmacist Pager [phone] 10/03/2015 11:03 AM

## 2015-10-04 ENCOUNTER — Telehealth: Payer: Self-pay | Admitting: Physician Assistant

## 2015-10-04 ENCOUNTER — Encounter (HOSPITAL_COMMUNITY): Payer: Self-pay | Admitting: Internal Medicine

## 2015-10-04 DIAGNOSIS — E785 Hyperlipidemia, unspecified: Secondary | ICD-10-CM

## 2015-10-04 DIAGNOSIS — I25810 Atherosclerosis of coronary artery bypass graft(s) without angina pectoris: Secondary | ICD-10-CM

## 2015-10-04 LAB — CBC
HCT: 40.7 % (ref 36.0–46.0)
Hemoglobin: 12.8 g/dL (ref 12.0–15.0)
MCH: 29.4 pg (ref 26.0–34.0)
MCHC: 31.4 g/dL (ref 30.0–36.0)
MCV: 93.6 fL (ref 78.0–100.0)
PLATELETS: 433 10*3/uL — AB (ref 150–400)
RBC: 4.35 MIL/uL (ref 3.87–5.11)
RDW: 16.1 % — AB (ref 11.5–15.5)
WBC: 9.5 10*3/uL (ref 4.0–10.5)

## 2015-10-04 LAB — PROTIME-INR
INR: 2.37
PROTHROMBIN TIME: 26.3 s — AB (ref 11.4–15.2)

## 2015-10-04 MED ORDER — ATORVASTATIN CALCIUM 80 MG PO TABS: 80.0000 mg | ORAL_TABLET | Freq: Every day | ORAL | 3 refills | Status: DC

## 2015-10-04 MED ORDER — SOTALOL HCL 120 MG PO TABS: 120.0000 mg | ORAL_TABLET | Freq: Two times a day (BID) | ORAL | 1 refills | Status: DC

## 2015-10-04 NOTE — Progress Notes (Signed)
SUBJECTIVE:  No complaints  OBJECTIVE:   Vitals:   Vitals:   10/03/15 1034 10/03/15 1502 10/03/15 2021 10/04/15 0454  BP: 132/63 96/63 (!) 105/55 (!) 109/51  Pulse: 60 (!) 58 (!) 57 (!) 54  Resp:  18 18 18   Temp:  98.3 F (36.8 C) 98.2 F (36.8 C) 98 F (36.7 C)  TempSrc:  Oral Oral Oral  SpO2: 97% 97% 97% 96%  Weight:      Height:       I&O's:   Intake/Output Summary (Last 24 hours) at 10/04/15 0834 Last data filed at 10/03/15 1502  Gross per 24 hour  Intake              240 ml  Output                0 ml  Net              240 ml   TELEMETRY: Reviewed telemetry pt in NSR:     PHYSICAL EXAM General: Well developed, well nourished, in no acute distress Head: Eyes PERRLA, No xanthomas.   Normal cephalic and atramatic  Lungs:   Clear bilaterally to auscultation and percussion. Heart:   HRRR S1 S2 Pulses are 2+ & equal. Abdomen: Bowel sounds are positive, abdomen soft and non-tender without masses  Msk:  Back normal, normal gait. Normal strength and tone for age. Extremities:   No clubbing, cyanosis or edema.  DP +1 Neuro: Alert and oriented X 3. Psych:  Good affect, responds appropriately   LABS: Basic Metabolic Panel:  Recent Labs  10/02/15 0515 10/03/15 1004  NA 142 137  K 3.9 4.2  CL 107 108  CO2 29 25  GLUCOSE 97 102*  BUN 10 11  CREATININE 0.92 0.94  CALCIUM 8.0* 7.9*  MG 2.2  --    Liver Function Tests: No results for input(s): AST, ALT, ALKPHOS, BILITOT, PROT, ALBUMIN in the last 72 hours. No results for input(s): LIPASE, AMYLASE in the last 72 hours. CBC:  Recent Labs  10/03/15 0228 10/04/15 0244  WBC 9.6 9.5  HGB 13.6 12.8  HCT 43.2 40.7  MCV 93.1 93.6  PLT 452* 433*   Cardiac Enzymes: No results for input(s): CKTOTAL, CKMB, CKMBINDEX, TROPONINI in the last 72 hours. BNP: Invalid input(s): POCBNP D-Dimer: No results for input(s): DDIMER in the last 72 hours. Hemoglobin A1C: No results for input(s): HGBA1C in the last 72  hours. Fasting Lipid Panel: No results for input(s): CHOL, HDL, LDLCALC, TRIG, CHOLHDL, LDLDIRECT in the last 72 hours. Thyroid Function Tests: No results for input(s): TSH, T4TOTAL, T3FREE, THYROIDAB in the last 72 hours.  Invalid input(s): FREET3 Anemia Panel: No results for input(s): VITAMINB12, FOLATE, FERRITIN, TIBC, IRON, RETICCTPCT in the last 72 hours. Coag Panel:   Lab Results  Component Value Date   INR 2.37 10/04/2015   INR 2.34 10/03/2015   INR 2.23 10/03/2015    RADIOLOGY: Dg Chest 2 View  Result Date: 10/01/2015 CLINICAL DATA:  Shortness of breath EXAM: CHEST  2 VIEW COMPARISON:  10/18/2008 FINDINGS: Levoconvex thoracic scoliosis. Mild enlargement of the cardiopericardial silhouette, without edema. Tortuous thoracic aorta. Thoracic spondylosis. Bony demineralization. Mild airway thickening. IMPRESSION: 1. Stable mild cardiomegaly, without edema. 2. Airway thickening is present, suggesting bronchitis or reactive airways disease. 3. Bony demineralization. 4. Thoracic spondylosis with some mild levoconvex thoracic scoliosis. Electronically Signed   By: Van Clines M.D.   On: 10/01/2015 14:55    ASSESSMENT AND PLAN:  1. Persistent atrial fibrillation The patient has symptomatic persistent atrial fibrillation. Continue Warfarin for CHADS2VASC of 4 - INR therapeutic at 2.37. S/P DCCV yesterday and maintaining NSR.  EKG pending today.    2. Morbid obesity Weight loss encouraged Our ability to maintain SR is reduced with obesity  3. CAD No recent ischemic symptoms  Continue current therapy with statin/Plavix/long acting nitrate/BB.  No ASA as she is on warfarin.  4. HTN Stable and controlled.   No change required today  5. SOB is resolved     CXR noted, no symptoms of illness, afebrile, no cough, no wheezing  Will check EKG and if QTc stable then ok for discharge today. She will need follow in Afib clinic with Roderic Palau, NP in 10-14 days.       Fransico Him, MD  10/04/2015  8:34 AM

## 2015-10-04 NOTE — Telephone Encounter (Signed)
Dr. Radford Pax called me - she was reviewing DC paperwork from earlier and noticed that pt's sotalol was not processed. I confirmed that [facility] [address] does in fact [facility] this in stock. I sent in RX, called patient and informed her of importance of picking this up today to take next dose this evening. She verbalized understanding and gratitude. Dayna Dunn PA-C

## 2015-10-04 NOTE — Discharge Summary (Signed)
Discharge Summary    Patient ID: [name],  MRN: [id number], DOB/AGE: 09/29/1945 70 y.o.  Admit date: 09/29/2015 Discharge date: 10/04/2015  Primary Care Provider: Robyne Peers. Primary Cardiologist: Dr. Marlou Porch  Electrophysiologist: Dr. Curt Bears   Discharge Diagnoses    Principal Problem:   Persistent atrial fibrillation [facility]) Active Problems:   Benign essential HTN   Morbid obesity ([place])   Hypertensive heart disease with heart failure (H[facility])   Chronic anticoagulation   CAD (coronary artery disease)   Hyperlipidemia   Allergies Allergies  Allergen Reactions  . Ceftriaxone Rash    "TTP"/Was hospitalized and in a coma for 60 days, per patient  . Levofloxacin Other (See Comments)    Dropped blood platelets extremely low  . Levofloxacin     Other reaction(s): Thrombocytopenia (intolerance)  . Oxycodone Itching  . Tape Other (See Comments)    Prefers paper or cloth tape  . Alendronate Rash  . Aspirin Other (See Comments)    Patient stated she is unable to take due to blood thinner  . Oxycodone-Acetaminophen Itching  . Oxycodone-Acetaminophen Itching  . Penicillin G Rash  . Penicillins Rash and Other (See Comments)    Cannot tolerate any "-CILLINS" Has patient had a PCN reaction causing immediate rash, facial/tongue/throat swelling, SOB or lightheadedness with hypotension: Yes Has patient had a PCN reaction causing severe rash involving mucus membranes or skin necrosis: No Has patient had a PCN reaction that required hospitalization No Has patient had a PCN reaction occurring within the last 10 years: Yes If all of the above answers are "NO", then may proceed with Cephalosporin use. Causes WELTS!!     History of Present Illness     [name] is a 70 y.o. female with a past medical history significant for CAD, chronic systolic heart failure (EF normalized post revascularization), hypertension, morbid obesity and paroxysmal atrial fibrillation  on Coumadin who presented to [facility] on 09/29[facility]15 for planned Tikosyn loading.   She has symptomatic atrial fibrillation and was seen in the office recently by Dr Curt Bears to discuss treatment options. She elected to pursue rhythm control with Tikosyn. Echo 08/2015 demonstrated EF 50-55%, no RWMA, LA 44. She was admitted on 09/29/15 for loading.   Hospital Course     Consultants: none   Paroxysmal atrial fibrillation:  She presented with symptomatic persistent atrial fibrillation. She had had therapeutic INR's for 2 weeks prior to admission but INR dropped to 1.99 on 09/30/15 and she was started on heparin gtt and set up for TEE on 09/30/15 to make sure there was no clot before initiating Tikosyn. TEE with no LA/LAA thrombus so she was started on Tikosyn 542mcg PO BID. Patient then realized that her insurance would not cover Tikosyn and so she was switched to Sotalol (patient worried about SE's of amiodarone). After a 24 hour waiting period, she was started on Sotalol and underwent DCCV on 10/03/15. INR 2.37 today. QTc stable at 472 and maintaining NSR. Will discharge her on home coumadin dosing. Will arrange for coumadin clinic appt this Tuesday on 10/06/15 and follow with Roderic Palau NP in the afib clinic.   Morbid obesity: Body mass index is 47.84 kg/m. Weight loss encouraged as our ability to maintain SR is reduced with obesity  CAD: no recent ischemic symptoms. Continue current therapy with statin/Plavix/long acting nitrate/BB.  No ASA as she is on warfarin.  HTN: stable and controlled.    Ischemic CM: EF now normalized after revascularization. Echo 08/2015 demonstrated  EF 50-55%, no RWMA, LA 44. Continue home lasix 20 mg dailu  SOB: is resolved. CXR noted, no symptoms of illness, afebrile, no cough, no wheezing  The patient has had an uncomplicated hospital course and is recovering well. She has been seen by Dr. Radford Pax today and deemed ready for discharge home. A staff message for all follow-up  appointments has been sent. Discharge medications are listed below.  _____________  Discharge Vitals Blood pressure (!) 109/51, pulse (!) 54, temperature 98 F (36.7 C), temperature source Oral, resp. rate 18, height 5\' 4"  (1.626 m), weight 278 lb 11.2 oz (126.4 kg), SpO2 96 %.  Filed Weights   09/29/15 1547  Weight: 278 lb 11.2 oz (126.4 kg)    Labs & Radiologic Studies     CBC  Recent Labs  10/03/15 0228 10/04/15 0244  WBC 9.6 9.5  HGB 13.6 12.8  HCT 43.2 40.7  MCV 93.1 93.6  PLT 452* [id number]*   Basic Metabolic Panel  Recent Labs  10/02/15 0515 10/03/15 1004  NA 142 137  K 3.9 4.2  CL 107 108  CO2 29 25  GLUCOSE 97 102*  BUN 10 11  CREATININE 0.92 0.94  CALCIUM 8.0* 7.9*  MG 2.2  --     Dg Chest 2 View  Result Date: 10/01/2015 CLINICAL DATA:  Shortness of breath EXAM: CHEST  2 VIEW COMPARISON:  10/18/2008 FINDINGS: Levoconvex thoracic scoliosis. Mild enlargement of the cardiopericardial silhouette, without edema. Tortuous thoracic aorta. Thoracic spondylosis. Bony demineralization. Mild airway thickening. IMPRESSION: 1. Stable mild cardiomegaly, without edema. 2. Airway thickening is present, suggesting bronchitis or reactive airways disease. 3. Bony demineralization. 4. Thoracic spondylosis with some mild levoconvex thoracic scoliosis. Electronically Signed   By: Van Clines M.D.   On: 10/01/2015 14:55     Diagnostic Studies/Procedures    TEE: 09/30/2015 LV EF: 50% -   55% Study Conclusions - Left ventricle: Systolic function was normal. The estimated   ejection fraction was in the range of 50% to 55%. Wall motion was   normal; there were no regional wall motion abnormalities. - Aortic valve: There was mild regurgitation. - Mitral valve: There was mild regurgitation, with multiple jets   directed centrally. - Left atrium: No evidence of thrombus in the atrial cavity or   appendage. No evidence of thrombus in the atrial cavity or   appendage. -  Right atrium: No evidence of thrombus in the atrial cavity or   appendage. - Atrial septum: No defect or patent foramen ovale was identified   by color flow Doppler or saline microcavitation study. _____________    Disposition   Pt is being discharged home today in good condition.  Follow-up Plans & Appointments    Follow-up Information    [facility] Follow up on 10/09/2015.   Specialty:  Cardiology Why:  11:30AM Contact information: [address] [id number] [place] [phone]       [facility] Follow up on 10/12/2015.   Specialty:  Cardiology Why:  10:15AM, lab/coumadin clinic Contact information: [address], [address] [place] [phone]       Will Meredith Leeds, MD Follow up on 10/28/2015.   Specialty:  Cardiology Why:  11:00AM Contact information: [address] [place] [address] [phone]        [facility] Follow up on 10/06/2015.   Why:  you need to be  seen in the coumadin clinic on Tuesday. The office will call you to set this up.  Contact information: [place] [phone]           Discharge Medications     Medication List    TAKE these medications   atorvastatin 80 MG tablet Commonly known as:  LIPITOR Take 1 tablet (80 mg total) by mouth daily at 6 PM.   benazepril 10 MG tablet Commonly known as:  LOTENSIN Take 1 tablet (10 mg total) by mouth daily.   clopidogrel 75 MG tablet Commonly known as:  PLAVIX Take 1 tablet (75 mg total) by mouth daily with breakfast.   furosemide 20 MG tablet Commonly known as:  LASIX Take 1 tablet (20 mg total) by mouth daily.   isosorbide mononitrate 30 MG 24 hr tablet Commonly known as:  IMDUR Take 1 tablet (30 mg total) by mouth daily.   levothyroxine 50 MCG  tablet Commonly known as:  SYNTHROID, LEVOTHROID Take 50 mcg by mouth daily before breakfast.   metoprolol succinate 100 MG 24 hr tablet Commonly known as:  TOPROL-XL Take 1 tablet (100 mg total) by mouth as directed. Take 1 tab = (100 mg) in the AM and 1/2 tab = (50 mg)in the PM What changed:  when to take this  additional instructions   nitroGLYCERIN 0.4 MG SL tablet Commonly known as:  NITROSTAT Place 1 tablet (0.4 mg total) under the tongue every 5 (five) minutes as needed for chest pain.   Vitamin D3 2000 units capsule Take 2,000 Units by mouth every morning.   Sotolol 120mg  tablet Take 1 tablet by mouth every 12 hours daily.   warfarin 5 MG tablet Commonly known as:  COUMADIN Take 1 tablet (5 mg total) by mouth daily. What changed:  how much to take  when to take this  additional instructions           Outstanding Labs/Studies   Coumadin clinic on Tuesday 10/06/15- needs to be scheduled.  Duration of Discharge Encounter   Greater than 30 minutes including physician time.  Signed, Angelena Form PA-C 10/04/2015, 9:19 AM  Agree with discharge summary as outlined above Fransico Him, MD

## 2015-10-08 ENCOUNTER — Ambulatory Visit (INDEPENDENT_AMBULATORY_CARE_PROVIDER_SITE_OTHER): Payer: Medicare Other | Admitting: *Deleted

## 2015-10-08 DIAGNOSIS — Z5181 Encounter for therapeutic drug level monitoring: Secondary | ICD-10-CM

## 2015-10-08 DIAGNOSIS — I4819 Other persistent atrial fibrillation: Secondary | ICD-10-CM

## 2015-10-08 DIAGNOSIS — I481 Persistent atrial fibrillation: Secondary | ICD-10-CM | POA: Diagnosis not present

## 2015-10-08 LAB — POCT INR: INR: 3

## 2015-10-09 ENCOUNTER — Ambulatory Visit (HOSPITAL_COMMUNITY): Payer: Medicare Other | Admitting: Nurse Practitioner

## 2015-10-27 ENCOUNTER — Encounter: Payer: Self-pay | Admitting: Cardiology

## 2015-10-27 NOTE — Progress Notes (Signed)
Electrophysiology Office Note   Date:  10/28/2015   ID:  [name], DOB [birth year], MRN [id number]  PCP:  Robyne Peers., MD  Cardiologist:  Marlou Porch Primary Electrophysiologist:  Ariel Trawick Meredith Leeds, MD    Chief Complaint  Patient presents with  . Follow-up    post Sotalol start/  . Atrial Fibrillation     History of Present Illness: [name] is a 70 y.o. female who presents today for electrophysiology evaluation.   History of AF, obesity, HTN, CHF, CAD, HLD. Presented to the hospital 9/3 for tikosyn loading. Switched to sotalol as tikosyn not covered by [employer]. Cardioversion 10/03/15.    Today, she denies symptoms of palpitations, chest pain, shortness of breath, orthopnea, PND, lower extremity edema, claudication, dizziness, presyncope, syncope, bleeding, or neurologic sequela. The patient is tolerating medications without difficulties and is otherwise without complaint today.   Since starting sotalol, she has felt much improved, with more energy and significantly less shortness of breath.   Past Medical History:  Diagnosis Date  . CAD (coronary artery disease)    a. LHC 3/17: mLAD 80, D1 50, pRCA 30, EF 25-35%; PCI: 3.5 x 16 mm Synergy DES to mLAD  . Chronic systolic CHF (congestive heart failure) ([place])    a. Echo 12/16: Mild concentric LVH, EF 25-30%, anteroseptal akinesis, inferoseptal akinesis, anterior akinesis, trivial AI, mild MR, moderate LAE, trivial TR, trivial PI, PASP 33 mmHg  //  b. Echo 2/17: Mild LVH, EF 25-30%, diffuse HK, mild LAE  //  c. Echo 7/17: mild LVH, EF 50-55%, no RWMA, trivial AI, mild to mod LAE, mild RAE  . Febrile seizure ([place]) 2004   "during TTP thing; cause my temp was 106"  . High cholesterol   . History of blood transfusion 2004   "several; related to TTP"  . Hypertension   . Hypothyroidism   . Ischemic cardiomyopathy   . Morbid obesity ([place]) 01/07/2013  . Osteoarthritis of back   . Paroxysmal atrial fibrillation  ([place]) 08/01/2011   a. started on Sotalol during 10/2015 admission (Tikosyn too expensive)  . Pneumonia ?1999  . Scoliosis   . Thyroid nodule    "grew back after 2 partial thyroid surgeries" (09/29/2015)  . 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 ABDOMINAL EXPLORATION SURGERY     "opened me up 2-3 times for blocked intestines"  . APPENDECTOMY    . BACK SURGERY    . BASAL CELL CARCINOMA EXCISION Right    "neck"  . CARDIAC CATHETERIZATION N/A 04/17/2015   Procedure: Left Heart Cath and Coronary Angiography;  Surgeon: Jettie Booze, MD;  Location: [facility];  Service: Cardiovascular;  Laterality: N/A;  . CARDIAC CATHETERIZATION  04/17/2015   Procedure: Coronary Stent Intervention;  Surgeon: Jettie Booze, MD;  Location: [facility];  Service: Cardiovascular;;  . CARDIOVERSION  06/2002; 03/2005; 05/2005   Archie Endo 06/16/2010  . CARDIOVERSION N/A 10/03/2015   Procedure: CARDIOVERSION;  Surgeon: Deboraha Sprang, MD;  Location: [facility];  Service: Cardiovascular;  Laterality: N/A;  . CATARACT EXTRACTION W/ INTRAOCULAR LENS  IMPLANT, BILATERAL Bilateral   . CHOLECYSTECTOMY OPEN    . CORONARY ANGIOPLASTY    . DILATION AND CURETTAGE OF UTERUS     S/P "miscarriage"  . FOOT SURGERY Right    "for nerve damage"  . SALIVARY STONE REMOVAL Left    "had tumor wrapped around it"  . SPLENECTOMY, TOTAL     /  notes 06/16/2010  . TEE WITHOUT CARDIOVERSION N/A 09/30/2015   Procedure: TRANSESOPHAGEAL ECHOCARDIOGRAM (TEE);  Surgeon: Skeet Latch, MD;  Location: [facility];  Service: Cardiovascular;  Laterality: N/A;  . THORACIC DISCECTOMY  05/2002  . THYROIDECTOMY, PARTIAL  X 2   "grew back"  . TONSILLECTOMY    . TUBAL LIGATION    . TUMOR EXCISION     "benign tumor in my neck"  . VAGINAL HYSTERECTOMY       Current Outpatient Prescriptions  Medication Sig Dispense Refill  . atorvastatin (LIPITOR) 80 MG  tablet Take 1 tablet (80 mg total) by mouth daily at 6 PM. 90 tablet 3  . benazepril (LOTENSIN) 10 MG tablet Take 1 tablet (10 mg total) by mouth daily. 90 tablet 3  . Cholecalciferol (VITAMIN D3) 2000 units capsule Take 2,000 Units by mouth every morning.    . clopidogrel (PLAVIX) 75 MG tablet Take 1 tablet (75 mg total) by mouth daily with breakfast. 90 tablet 3  . furosemide (LASIX) 20 MG tablet Take 1 tablet (20 mg total) by mouth daily. 90 tablet 3  . isosorbide mononitrate (IMDUR) 30 MG 24 hr tablet Take 1 tablet (30 mg total) by mouth daily. 90 tablet 3  . levothyroxine (SYNTHROID, LEVOTHROID) 50 MCG tablet Take 50 mcg by mouth daily before breakfast.    . metoprolol succinate (TOPROL-XL) 100 MG 24 hr tablet Take 1 tablet (100 mg total) by mouth as directed. Take 1 tab = (100 mg) in the AM and 1/2 tab = (50 mg)in the PM (Patient taking differently: Take 100 mg by mouth every morning. ) 180 tablet 3  . nitroGLYCERIN (NITROSTAT) 0.4 MG SL tablet Place 1 tablet (0.4 mg total) under the tongue every 5 (five) minutes as needed for chest pain. 25 tablet 3  . sotalol (BETAPACE) 120 MG tablet Take 1 tablet (120 mg total) by mouth 2 (two) times daily. 60 tablet 1  . warfarin (COUMADIN) 5 MG tablet Take 1 tablet (5 mg total) by mouth daily. (Patient taking differently: Take 5-7.5 mg by mouth See admin instructions. 7.5 mg in the morning on Sun/Wed and 5 mg on Mon/Tues/Thurs/Fri/Sat) 30 tablet 11   No current facility-administered medications for this visit.     Allergies:   Ceftriaxone; Levofloxacin; Levofloxacin; Oxycodone; Tape; Alendronate; Aspirin; Oxycodone-acetaminophen; Oxycodone-acetaminophen; Penicillin g; and Penicillins   Social History:  The patient  reports that she has never smoked. She has never used smokeless tobacco. She reports that she does not drink alcohol or use drugs.   Family History:  The patient's family history includes Cancer in her brother and sister; Heart attack in her  mother.    ROS:  Please see the history of present illness.   Otherwise, review of systems is positive for none.   All other systems are reviewed and negative.    PHYSICAL EXAM: VS:  BP 130/82   Pulse (!) 51   Ht 5\' 5"  (1.651 m)   Wt 276 lb 6.4 oz (125.4 kg)   BMI 46.00 kg/m  , BMI Body mass index is 46 kg/m. GEN: Well nourished, well developed, in no acute distress  HEENT: normal  Neck: no JVD, carotid bruits, or masses Cardiac: RRR; no murmurs, rubs, or gallops,no edema  Respiratory:  clear to auscultation bilaterally, normal work of breathing GI: soft, nontender, nondistended, + BS MS: no deformity or atrophy  Skin: warm and dry Neuro:  Strength and sensation are intact Psych: euthymic mood, full affect  EKG:  EKG  is ordered today. Personal review of the ekg ordered shows sinus rhythm, rate 51, QTc 435  Recent Labs: 01/05/2015: Brain Natriuretic Peptide 126.2; TSH 3.765 08/21/2015: ALT 17 10/02/2015: Magnesium 2.2 10/03/2015: BUN 11; Creatinine, Ser 0.94; Potassium 4.2; Sodium 137 10/04/2015: Hemoglobin 12.8; Platelets 433    Lipid Panel     Component Value Date/Time   CHOL 118 (L) 08/21/2015 1037   TRIG 89 08/21/2015 1037   HDL 47 08/21/2015 1037   CHOLHDL 2.5 08/21/2015 1037   VLDL 18 08/21/2015 1037   LDLCALC 53 08/21/2015 1037     Wt Readings from Last 3 Encounters:  10/28/15 276 lb 6.4 oz (125.4 kg)  09/29/15 278 lb 11.2 oz (126.4 kg)  09/07/15 276 lb 3.2 oz (125.3 kg)      Other studies Reviewed: Additional studies/ records that were reviewed today include: TEE 09/30/15  Review of the above records today demonstrates:  - Left ventricle: Systolic function was normal. The estimated   ejection fraction was in the range of 50% to 55%. Wall motion was   normal; there were no regional wall motion abnormalities. - Aortic valve: There was mild regurgitation. - Mitral valve: There was mild regurgitation, with multiple jets   directed centrally. - Left atrium: No  evidence of thrombus in the atrial cavity or   appendage. No evidence of thrombus in the atrial cavity or   appendage. - Right atrium: No evidence of thrombus in the atrial cavity or   appendage. - Atrial septum: No defect or patent foramen ovale was identified   by color flow Doppler or saline microcavitation study.   ASSESSMENT AND PLAN:  1.  Atrial fibrillation: on sotalol and coumadin.  She is feeling much better after initiation of sotalol and being in sinus rhythm. She is doing well without any major issues. We'll continue her on her sotalol. I Humberto Addo refer  her to A. Fib clinic, and she did express an interest in lifestyle modification and weight loss.  This patients CHA2DS2-VASc Score and unadjusted Ischemic Stroke Rate (% per year) is equal to 4.8 % stroke rate/year from a score of 4  Above score calculated as 1 point each if present [CHF, HTN, DM, Vascular=MI/PAD/Aortic Plaque, Age if 65-74, or Female] Above score calculated as 2 points each if present [Age > 75, or Stroke/TIA/TE]  2. Morbid obesity:  Discussed exercise  And weight loss as a method of improving fibrillation therapy.  Keziyah Kneale refer to A. Fib clinic for further discussion.  3. CAD: Currently asymptomatic  4. Hypertension: well controlled today     Current medicines are reviewed at length with the patient today.   The patient does not have concerns regarding her medicines.  The following changes were made today:  none  Labs/ tests ordered today include:  No orders of the defined types were placed in this encounter.    Disposition:   FU with Derya Dettmann 6 months  Signed, Yara Tomkinson Meredith Leeds, MD  10/28/2015 11:15 AM     [facility] [address] [place] [address] [phone] (office) [phone] (fax)

## 2015-10-28 ENCOUNTER — Ambulatory Visit (INDEPENDENT_AMBULATORY_CARE_PROVIDER_SITE_OTHER): Payer: Medicare Other | Admitting: *Deleted

## 2015-10-28 ENCOUNTER — Ambulatory Visit (INDEPENDENT_AMBULATORY_CARE_PROVIDER_SITE_OTHER): Payer: Medicare Other | Admitting: Cardiology

## 2015-10-28 ENCOUNTER — Encounter: Payer: Self-pay | Admitting: Cardiology

## 2015-10-28 VITALS — BP 130/82 | HR 51 | Ht 65.0 in | Wt 276.4 lb

## 2015-10-28 DIAGNOSIS — Z5181 Encounter for therapeutic drug level monitoring: Secondary | ICD-10-CM

## 2015-10-28 DIAGNOSIS — I481 Persistent atrial fibrillation: Secondary | ICD-10-CM

## 2015-10-28 DIAGNOSIS — I4819 Other persistent atrial fibrillation: Secondary | ICD-10-CM

## 2015-10-28 DIAGNOSIS — I480 Paroxysmal atrial fibrillation: Secondary | ICD-10-CM

## 2015-10-28 LAB — POCT INR: INR: 2.3

## 2015-10-28 NOTE — Patient Instructions (Signed)
Medication Instructions:    Your physician recommends that you continue on your current medications as directed. Please refer to the Current Medication list given to you today.  --- If you need a refill on your cardiac medications before your next appointment, please call your pharmacy. ---  Labwork:  None ordered  Testing/Procedures:  None ordered  Follow-Up:  Your physician recommends that you schedule a follow-up appointment in: 1 month with Roderic Palau, NP in the AFib clinic -- to discuss lifestyle modifications.   Your physician wants you to follow-up in: 6 months with Dr. Curt Bears.  You will receive a reminder letter in the mail two months in advance. If you don't receive a letter, please call our office to schedule the follow-up appointment.  Thank you for choosing [facility]!!   Trinidad Curet, RN [phone]

## 2015-11-06 ENCOUNTER — Ambulatory Visit: Payer: Medicare Other | Admitting: Physician Assistant

## 2015-11-25 ENCOUNTER — Ambulatory Visit (INDEPENDENT_AMBULATORY_CARE_PROVIDER_SITE_OTHER): Payer: Medicare Other | Admitting: *Deleted

## 2015-11-25 DIAGNOSIS — I481 Persistent atrial fibrillation: Secondary | ICD-10-CM

## 2015-11-25 DIAGNOSIS — Z5181 Encounter for therapeutic drug level monitoring: Secondary | ICD-10-CM | POA: Diagnosis not present

## 2015-11-25 DIAGNOSIS — I4819 Other persistent atrial fibrillation: Secondary | ICD-10-CM

## 2015-11-25 LAB — POCT INR: INR: 1.7

## 2015-11-28 ENCOUNTER — Other Ambulatory Visit: Payer: Self-pay | Admitting: Physician Assistant

## 2015-12-09 ENCOUNTER — Encounter (INDEPENDENT_AMBULATORY_CARE_PROVIDER_SITE_OTHER): Payer: Self-pay

## 2015-12-09 ENCOUNTER — Ambulatory Visit (INDEPENDENT_AMBULATORY_CARE_PROVIDER_SITE_OTHER): Payer: Medicare Other | Admitting: *Deleted

## 2015-12-09 DIAGNOSIS — I481 Persistent atrial fibrillation: Secondary | ICD-10-CM | POA: Diagnosis not present

## 2015-12-09 DIAGNOSIS — Z5181 Encounter for therapeutic drug level monitoring: Secondary | ICD-10-CM

## 2015-12-09 DIAGNOSIS — I4819 Other persistent atrial fibrillation: Secondary | ICD-10-CM

## 2015-12-09 LAB — POCT INR: INR: 1.7

## 2015-12-10 ENCOUNTER — Ambulatory Visit: Payer: Medicare Other | Admitting: Cardiology

## 2015-12-22 ENCOUNTER — Ambulatory Visit (INDEPENDENT_AMBULATORY_CARE_PROVIDER_SITE_OTHER): Payer: Medicare Other | Admitting: *Deleted

## 2015-12-22 DIAGNOSIS — I4819 Other persistent atrial fibrillation: Secondary | ICD-10-CM

## 2015-12-22 DIAGNOSIS — Z5181 Encounter for therapeutic drug level monitoring: Secondary | ICD-10-CM | POA: Diagnosis not present

## 2015-12-22 DIAGNOSIS — I481 Persistent atrial fibrillation: Secondary | ICD-10-CM | POA: Diagnosis not present

## 2015-12-22 LAB — POCT INR: INR: 2.5

## 2015-12-23 ENCOUNTER — Telehealth: Payer: Self-pay | Admitting: *Deleted

## 2015-12-23 NOTE — Telephone Encounter (Addendum)
Pt called & stated that her PCP Dr. Ruben Gottron will be taking over following her INR & dosing her Coumadin. She states she has an appointment on 01/11/16 with their office. She states it is too far for her to drive to keep coming to our office & will cost her less money. Advised to call back if anything changes with her PCP following her & she verbalized understanding.    Called Dr. Carren Rang office & they stated the pt normally sees the [place] & that they stated they will not be following the pt's INR/Coumadin. Called the pt back & advised of this & she stated that they just told her on 12/22/15 that they would. Advised that they will not & they wanted her to remain with our office & she stated she can't come this far without a driver & she would have to continue to find a driver. Advised to call her driver today so she can return to our office to have INR checked on 01/12/16 & she stated she would until she found another doctor in [facility].

## 2015-12-30 ENCOUNTER — Other Ambulatory Visit: Payer: Self-pay | Admitting: Physician Assistant

## 2015-12-30 NOTE — Telephone Encounter (Signed)
Review for refill. 

## 2016-01-12 ENCOUNTER — Ambulatory Visit (INDEPENDENT_AMBULATORY_CARE_PROVIDER_SITE_OTHER): Payer: Medicare Other | Admitting: *Deleted

## 2016-01-12 DIAGNOSIS — Z5181 Encounter for therapeutic drug level monitoring: Secondary | ICD-10-CM | POA: Diagnosis not present

## 2016-01-12 DIAGNOSIS — I481 Persistent atrial fibrillation: Secondary | ICD-10-CM | POA: Diagnosis not present

## 2016-01-12 DIAGNOSIS — I4819 Other persistent atrial fibrillation: Secondary | ICD-10-CM

## 2016-01-12 LAB — POCT INR: INR: 2.3

## 2016-01-14 ENCOUNTER — Other Ambulatory Visit: Payer: Self-pay | Admitting: Physician Assistant

## 2016-01-14 DIAGNOSIS — I4819 Other persistent atrial fibrillation: Secondary | ICD-10-CM

## 2016-01-14 NOTE — Telephone Encounter (Signed)
Please review for refill.  

## 2016-02-10 ENCOUNTER — Ambulatory Visit (INDEPENDENT_AMBULATORY_CARE_PROVIDER_SITE_OTHER): Payer: Medicare Other | Admitting: *Deleted

## 2016-02-10 DIAGNOSIS — I481 Persistent atrial fibrillation: Secondary | ICD-10-CM

## 2016-02-10 DIAGNOSIS — I4819 Other persistent atrial fibrillation: Secondary | ICD-10-CM

## 2016-02-10 DIAGNOSIS — Z5181 Encounter for therapeutic drug level monitoring: Secondary | ICD-10-CM | POA: Diagnosis not present

## 2016-02-10 LAB — POCT INR: INR: 1.9

## 2016-03-14 ENCOUNTER — Ambulatory Visit (INDEPENDENT_AMBULATORY_CARE_PROVIDER_SITE_OTHER): Payer: Medicare Other | Admitting: *Deleted

## 2016-03-14 DIAGNOSIS — I4819 Other persistent atrial fibrillation: Secondary | ICD-10-CM

## 2016-03-14 DIAGNOSIS — Z5181 Encounter for therapeutic drug level monitoring: Secondary | ICD-10-CM | POA: Diagnosis not present

## 2016-03-14 DIAGNOSIS — I481 Persistent atrial fibrillation: Secondary | ICD-10-CM | POA: Diagnosis not present

## 2016-03-14 LAB — POCT INR: INR: 3.2

## 2016-04-13 ENCOUNTER — Ambulatory Visit (INDEPENDENT_AMBULATORY_CARE_PROVIDER_SITE_OTHER): Payer: Medicare Other | Admitting: *Deleted

## 2016-04-13 DIAGNOSIS — I481 Persistent atrial fibrillation: Secondary | ICD-10-CM

## 2016-04-13 DIAGNOSIS — Z5181 Encounter for therapeutic drug level monitoring: Secondary | ICD-10-CM | POA: Diagnosis not present

## 2016-04-13 DIAGNOSIS — I4819 Other persistent atrial fibrillation: Secondary | ICD-10-CM

## 2016-04-13 LAB — POCT INR: INR: 2.2

## 2016-04-18 ENCOUNTER — Other Ambulatory Visit: Payer: Self-pay | Admitting: *Deleted

## 2016-04-18 MED ORDER — WARFARIN SODIUM 5 MG PO TABS: ORAL_TABLET | ORAL | 3 refills | Status: DC

## 2016-04-19 ENCOUNTER — Other Ambulatory Visit: Payer: Self-pay | Admitting: *Deleted

## 2016-04-19 NOTE — Telephone Encounter (Signed)
Pharmacy requests ninety day. 

## 2016-05-09 ENCOUNTER — Other Ambulatory Visit: Payer: Self-pay | Admitting: Cardiology

## 2016-05-10 ENCOUNTER — Ambulatory Visit (INDEPENDENT_AMBULATORY_CARE_PROVIDER_SITE_OTHER): Payer: Medicare Other | Admitting: *Deleted

## 2016-05-10 DIAGNOSIS — I481 Persistent atrial fibrillation: Secondary | ICD-10-CM

## 2016-05-10 DIAGNOSIS — Z5181 Encounter for therapeutic drug level monitoring: Secondary | ICD-10-CM | POA: Diagnosis not present

## 2016-05-10 DIAGNOSIS — I4819 Other persistent atrial fibrillation: Secondary | ICD-10-CM

## 2016-05-10 LAB — POCT INR: INR: 3.3

## 2016-05-10 MED ORDER — WARFARIN SODIUM 5 MG PO TABS: ORAL_TABLET | ORAL | 3 refills | Status: DC

## 2016-05-25 ENCOUNTER — Encounter (INDEPENDENT_AMBULATORY_CARE_PROVIDER_SITE_OTHER): Payer: Self-pay

## 2016-05-25 ENCOUNTER — Ambulatory Visit (INDEPENDENT_AMBULATORY_CARE_PROVIDER_SITE_OTHER): Payer: Medicare Other | Admitting: Cardiology

## 2016-05-25 ENCOUNTER — Encounter: Payer: Self-pay | Admitting: Cardiology

## 2016-05-25 VITALS — BP 138/76 | HR 60 | Ht 64.0 in | Wt 280.2 lb

## 2016-05-25 DIAGNOSIS — I481 Persistent atrial fibrillation: Secondary | ICD-10-CM

## 2016-05-25 DIAGNOSIS — I4819 Other persistent atrial fibrillation: Secondary | ICD-10-CM

## 2016-05-25 NOTE — Progress Notes (Signed)
Electrophysiology Office Note   Date:  05/25/2016   ID:  [name], DOB 01-Mar-1945, MRN [id number]  PCP:  Elisabeth Cara, PA-C  Cardiologist:  Marlou Porch Primary Electrophysiologist:  Jiro Kiester Meredith Leeds, MD    Chief Complaint  Patient presents with  . Follow-up    Persistent Afib     History of Present Illness: [name] is a 71 y.o. female who presents today for electrophysiology evaluation.   History of AF, obesity, HTN, CHF, CAD, HLD. Presented to the hospital 9/3 for tikosyn loading. Switched to sotalol as tikosyn not covered by [employer]. Cardioversion 10/03/15.    Today, she denies symptoms of palpitations, chest pain, shortness of breath, orthopnea, PND, lower extremity edema, claudication, dizziness, presyncope, syncope, bleeding, or neurologic sequela. He is feeling well on her sotalol. She does say that she had an episode of atrial fibrillation 2 weeks ago. She feels that she was out of rhythm for 3 days. Her complaints were of fatigue and shortness of breath. She otherwise has been feeling well on her sotalol. Unfortunately, her husband died 2 months ago. She says that he had cancer and had a major bleeding episode at home. She has been dealing with his death over the last few months.   Past Medical History:  Diagnosis Date  . CAD (coronary artery disease)    a. LHC 3/17: mLAD 80, D1 50, pRCA 30, EF 25-35%; PCI: 3.5 x 16 mm Synergy DES to mLAD  . Chronic systolic CHF (congestive heart failure) ([place])    a. Echo 12/16: Mild concentric LVH, EF 25-30%, anteroseptal akinesis, inferoseptal akinesis, anterior akinesis, trivial AI, mild MR, moderate LAE, trivial TR, trivial PI, PASP 33 mmHg  //  b. Echo 2/17: Mild LVH, EF 25-30%, diffuse HK, mild LAE  //  c. Echo 7/17: mild LVH, EF 50-55%, no RWMA, trivial AI, mild to mod LAE, mild RAE  . Febrile seizure ([place]) 2004   "during TTP thing; cause my temp was 106"  . High cholesterol   . History of blood  transfusion 2004   "several; related to TTP"  . Hypertension   . Hypothyroidism   . Ischemic cardiomyopathy   . Morbid obesity ([place]) 01/07/2013  . Osteoarthritis of back   . Paroxysmal atrial fibrillation ([place]) 08/01/2011   a. started on Sotalol during 10/2015 admission (Tikosyn too expensive)  . Pneumonia ?1999  . Scoliosis   . Thyroid nodule    "grew back after 2 partial thyroid surgeries" (09/29/2015)  . 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 ABDOMINAL EXPLORATION SURGERY     "opened me up 2-3 times for blocked intestines"  . APPENDECTOMY    . BACK SURGERY    . BASAL CELL CARCINOMA EXCISION Right    "neck"  . CARDIAC CATHETERIZATION N/A 04/17/2015   Procedure: Left Heart Cath and Coronary Angiography;  Surgeon: Jettie Booze, MD;  Location: [place];  Service: Cardiovascular;  Laterality: N/A;  . CARDIAC CATHETERIZATION  04/17/2015   Procedure: Coronary Stent Intervention;  Surgeon: Jettie Booze, MD;  Location: [facility];  Service: Cardiovascular;;  . CARDIOVERSION  06/2002; 03/2005; 05/2005   Archie Endo 06/16/2010  . CARDIOVERSION N/A 10/03/2015   Procedure: CARDIOVERSION;  Surgeon: Deboraha Sprang, MD;  Location: [facility];  Service: Cardiovascular;  Laterality: N/A;  . CATARACT EXTRACTION W/ INTRAOCULAR LENS  IMPLANT, BILATERAL Bilateral   . CHOLECYSTECTOMY OPEN    .  CORONARY ANGIOPLASTY    . DILATION AND CURETTAGE OF UTERUS     S/P "miscarriage"  . FOOT SURGERY Right    "for nerve damage"  . SALIVARY STONE REMOVAL Left    "had tumor wrapped around it"  . SPLENECTOMY, TOTAL     Archie Endo 06/16/2010  . TEE WITHOUT CARDIOVERSION N/A 09/30/2015   Procedure: TRANSESOPHAGEAL ECHOCARDIOGRAM (TEE);  Surgeon: Skeet Latch, MD;  Location: [place];  Service: Cardiovascular;  Laterality: N/A;  . THORACIC DISCECTOMY  05/2002  . THYROIDECTOMY, PARTIAL  X 2   "grew back"  . TONSILLECTOMY     . TUBAL LIGATION    . TUMOR EXCISION     "benign tumor in my neck"  . VAGINAL HYSTERECTOMY       Current Outpatient Prescriptions  Medication Sig Dispense Refill  . atorvastatin (LIPITOR) 80 MG tablet Take 1 tablet (80 mg total) by mouth daily at 6 PM. 90 tablet 3  . benazepril (LOTENSIN) 10 MG tablet TAKE 1 TABLET BY MOUTH EVERY DAY 90 tablet 1  . Cholecalciferol (VITAMIN D3) 2000 units capsule Take 2,000 Units by mouth every morning.    . clopidogrel (PLAVIX) 75 MG tablet TAKE 1 TABLET (75 MG TOTAL) BY MOUTH DAILY WITH BREAKFAST. 90 tablet 1  . furosemide (LASIX) 20 MG tablet Take 1 tablet (20 mg total) by mouth daily. 90 tablet 3  . isosorbide mononitrate (IMDUR) 30 MG 24 hr tablet Take 1 tablet (30 mg total) by mouth daily. 90 tablet 3  . levothyroxine (SYNTHROID, LEVOTHROID) 50 MCG tablet Take 50 mcg by mouth daily before breakfast.    . metoprolol succinate (TOPROL-XL) 100 MG 24 hr tablet Take 1 tablet (100 mg total) by mouth as directed. Take 1 tab = (100 mg) in the AM and 1/2 tab = (50 mg)in the PM (Patient taking differently: Take 100 mg by mouth every morning. ) 180 tablet 3  . nitroGLYCERIN (NITROSTAT) 0.4 MG SL tablet Place 1 tablet (0.4 mg total) under the tongue every 5 (five) minutes as needed for chest pain. 25 tablet 3  . sotalol (BETAPACE) 120 MG tablet TAKE 1 TABLET BY MOUTH TWICE A DAY 60 tablet 10  . warfarin (COUMADIN) 5 MG tablet Take as directed by Coumadin Clinic 50 tablet 3   No current facility-administered medications for this visit.     Allergies:   Ceftriaxone; Levofloxacin; Levofloxacin; Oxycodone; Tape; Alendronate; Aspirin; Oxycodone-acetaminophen; Oxycodone-acetaminophen; Penicillin g; and Penicillins   Social History:  The patient  reports that she has never smoked. She has never used smokeless tobacco. She reports that she does not drink alcohol or use drugs.   Family History:  The patient's family history includes Cancer in her brother and sister;  Heart attack in her mother.    ROS:  Please see the history of present illness.   Otherwise, review of systems is positive for none.   All other systems are reviewed and negative.      PHYSICAL EXAM: VS:  BP 138/76   Pulse 60   Ht 5\' 4"  (1.626 m)   Wt 280 lb 3.2 oz (127.1 kg)   BMI 48.10 kg/m  , BMI Body mass index is 48.1 kg/m. GEN: Well nourished, well developed, in no acute distress  HEENT: normal  Neck: no JVD, carotid bruits, or masses Cardiac: RRR; no murmurs, rubs, or gallops,no edema  Respiratory:  clear to auscultation bilaterally, normal work of breathing GI: soft, nontender, nondistended, + BS MS: no deformity or atrophy  Skin:  warm and dry Neuro:  Strength and sensation are intact Psych: euthymic mood, full affect   EKG:  EKG is ordered today. Personal review of the ekg ordered shows tennis rhythm, rate 60, QT 462 ms  Recent Labs: 08/21/2015: ALT 17 10/02/2015: Magnesium 2.2 10/03/2015: BUN 11; Creatinine, Ser 0.94; Potassium 4.2; Sodium 137 10/04/2015: Hemoglobin 12.8; Platelets 433    Lipid Panel     Component Value Date/Time   CHOL 118 (L) 08/21/2015 1037   TRIG 89 08/21/2015 1037   HDL 47 08/21/2015 1037   CHOLHDL 2.5 08/21/2015 1037   VLDL 18 08/21/2015 1037   LDLCALC 53 08/21/2015 1037     Wt Readings from Last 3 Encounters:  05/25/16 280 lb 3.2 oz (127.1 kg)  10/28/15 276 lb 6.4 oz (125.4 kg)  09/29/15 278 lb 11.2 oz (126.4 kg)      Other studies Reviewed: Additional studies/ records that were reviewed today include: TEE 09/30/15  Review of the above records today demonstrates:  - Left ventricle: Systolic function was normal. The estimated   ejection fraction was in the range of 50% to 55%. Wall motion was   normal; there were no regional wall motion abnormalities. - Aortic valve: There was mild regurgitation. - Mitral valve: There was mild regurgitation, with multiple jets   directed centrally. - Left atrium: No evidence of thrombus in the  atrial cavity or   appendage. No evidence of thrombus in the atrial cavity or   appendage. - Right atrium: No evidence of thrombus in the atrial cavity or   appendage. - Atrial septum: No defect or patent foramen ovale was identified   by color flow Doppler or saline microcavitation study.   ASSESSMENT AND PLAN:  1.  Atrial fibrillation: on sotalol and coumadin.  Been feeling well on her sotalol aside from her one episode of atrial fibrillation. We'll continue her sotalol at the current dose. Have encouraged weight loss. She has gained weight since her husband has died.  This patients CHA2DS2-VASc Score and unadjusted Ischemic Stroke Rate (% per year) is equal to 4.8 % stroke rate/year from a score of 4  Above score calculated as 1 point each if present [CHF, HTN, DM, Vascular=MI/PAD/Aortic Plaque, Age if 65-74, or Female] Above score calculated as 2 points each if present [Age > 75, or Stroke/TIA/TE]  2. Morbid obesity:  Exercise and weight loss. She has been trying to lose weight  3. CAD: Currently symptomatic. Continue current management.  4. Hypertension: Well-controlled today.     Current medicines are reviewed at length with the patient today.   The patient does not have concerns regarding her medicines.  The following changes were made today:    Labs/ tests ordered today include:  Orders Placed This Encounter  Procedures  . EKG 12-Lead     Disposition:   FU with Korrina Zern 6 months  Signed, Zachry Hopfensperger Meredith Leeds, MD  05/25/2016 11:26 AM     [facility] [address] [place] [address] [phone] (office) [phone] (fax)

## 2016-05-25 NOTE — Patient Instructions (Signed)
Medication Instructions:    Your physician recommends that you continue on your current medications as directed. Please refer to the Current Medication list given to you today.  Labwork:  None ordered  Testing/Procedures:  None ordered  Follow-Up:  Your physician wants you to follow-up in: 6 months with Dr. Camnitz.  You will receive a reminder letter in the mail two months in advance. If you don't receive a letter, please call our office to schedule the follow-up appointment.  - If you need a refill on your cardiac medications before your next appointment, please call your pharmacy.    Thank you for choosing [facility]!!   Sherri Price, RN [phone]         

## 2016-06-08 ENCOUNTER — Ambulatory Visit (INDEPENDENT_AMBULATORY_CARE_PROVIDER_SITE_OTHER): Payer: Medicare Other | Admitting: *Deleted

## 2016-06-08 DIAGNOSIS — I481 Persistent atrial fibrillation: Secondary | ICD-10-CM | POA: Diagnosis not present

## 2016-06-08 DIAGNOSIS — Z5181 Encounter for therapeutic drug level monitoring: Secondary | ICD-10-CM | POA: Diagnosis not present

## 2016-06-08 DIAGNOSIS — I4819 Other persistent atrial fibrillation: Secondary | ICD-10-CM

## 2016-06-08 LAB — POCT INR: INR: 2.8

## 2016-07-06 ENCOUNTER — Ambulatory Visit (INDEPENDENT_AMBULATORY_CARE_PROVIDER_SITE_OTHER): Payer: Medicare Other

## 2016-07-06 DIAGNOSIS — Z5181 Encounter for therapeutic drug level monitoring: Secondary | ICD-10-CM

## 2016-07-06 DIAGNOSIS — I481 Persistent atrial fibrillation: Secondary | ICD-10-CM

## 2016-07-06 DIAGNOSIS — I4819 Other persistent atrial fibrillation: Secondary | ICD-10-CM

## 2016-07-06 LAB — POCT INR: INR: 2.2

## 2016-07-13 ENCOUNTER — Other Ambulatory Visit: Payer: Self-pay | Admitting: Cardiology

## 2016-07-13 NOTE — Telephone Encounter (Signed)
Pt's medications was sent to pt's pharmacy as requested. Confirmation received.  

## 2016-08-10 ENCOUNTER — Encounter (INDEPENDENT_AMBULATORY_CARE_PROVIDER_SITE_OTHER): Payer: Self-pay

## 2016-08-10 ENCOUNTER — Ambulatory Visit (INDEPENDENT_AMBULATORY_CARE_PROVIDER_SITE_OTHER): Payer: Medicare Other | Admitting: Pharmacist

## 2016-08-10 DIAGNOSIS — Z5181 Encounter for therapeutic drug level monitoring: Secondary | ICD-10-CM

## 2016-08-10 DIAGNOSIS — I481 Persistent atrial fibrillation: Secondary | ICD-10-CM | POA: Diagnosis not present

## 2016-08-10 DIAGNOSIS — I4819 Other persistent atrial fibrillation: Secondary | ICD-10-CM

## 2016-08-10 LAB — POCT INR: INR: 2

## 2016-08-26 ENCOUNTER — Other Ambulatory Visit: Payer: Self-pay | Admitting: Physician Assistant

## 2016-09-22 ENCOUNTER — Ambulatory Visit (INDEPENDENT_AMBULATORY_CARE_PROVIDER_SITE_OTHER): Payer: Medicare Other | Admitting: *Deleted

## 2016-09-22 ENCOUNTER — Encounter (HOSPITAL_COMMUNITY): Payer: Self-pay | Admitting: Emergency Medicine

## 2016-09-22 ENCOUNTER — Observation Stay (HOSPITAL_COMMUNITY)
Admission: EM | Admit: 2016-09-22 | Discharge: 2016-09-24 | Disposition: A | Discharge status: Home / Self Care | Payer: Medicare Other | Attending: Internal Medicine | Admitting: Internal Medicine

## 2016-09-22 ENCOUNTER — Emergency Department (HOSPITAL_COMMUNITY): Payer: Medicare Other

## 2016-09-22 DIAGNOSIS — Z880 Allergy status to penicillin: Secondary | ICD-10-CM | POA: Diagnosis not present

## 2016-09-22 DIAGNOSIS — I480 Paroxysmal atrial fibrillation: Secondary | ICD-10-CM | POA: Diagnosis not present

## 2016-09-22 DIAGNOSIS — I10 Essential (primary) hypertension: Secondary | ICD-10-CM | POA: Diagnosis not present

## 2016-09-22 DIAGNOSIS — Z6841 Body Mass Index (BMI) 40.0 and over, adult: Secondary | ICD-10-CM | POA: Diagnosis not present

## 2016-09-22 DIAGNOSIS — E785 Hyperlipidemia, unspecified: Secondary | ICD-10-CM | POA: Diagnosis not present

## 2016-09-22 DIAGNOSIS — I481 Persistent atrial fibrillation: Secondary | ICD-10-CM

## 2016-09-22 DIAGNOSIS — Z66 Do not resuscitate: Secondary | ICD-10-CM | POA: Diagnosis not present

## 2016-09-22 DIAGNOSIS — I255 Ischemic cardiomyopathy: Secondary | ICD-10-CM | POA: Insufficient documentation

## 2016-09-22 DIAGNOSIS — R739 Hyperglycemia, unspecified: Secondary | ICD-10-CM

## 2016-09-22 DIAGNOSIS — E1165 Type 2 diabetes mellitus with hyperglycemia: Secondary | ICD-10-CM | POA: Diagnosis not present

## 2016-09-22 DIAGNOSIS — Z79899 Other long term (current) drug therapy: Secondary | ICD-10-CM | POA: Insufficient documentation

## 2016-09-22 DIAGNOSIS — I509 Heart failure, unspecified: Secondary | ICD-10-CM

## 2016-09-22 DIAGNOSIS — M8580 Other specified disorders of bone density and structure, unspecified site: Secondary | ICD-10-CM | POA: Insufficient documentation

## 2016-09-22 DIAGNOSIS — M419 Scoliosis, unspecified: Secondary | ICD-10-CM | POA: Insufficient documentation

## 2016-09-22 DIAGNOSIS — Z955 Presence of coronary angioplasty implant and graft: Secondary | ICD-10-CM | POA: Insufficient documentation

## 2016-09-22 DIAGNOSIS — Z7901 Long term (current) use of anticoagulants: Secondary | ICD-10-CM | POA: Diagnosis not present

## 2016-09-22 DIAGNOSIS — E039 Hypothyroidism, unspecified: Secondary | ICD-10-CM | POA: Insufficient documentation

## 2016-09-22 DIAGNOSIS — M311 Thrombotic microangiopathy: Secondary | ICD-10-CM | POA: Insufficient documentation

## 2016-09-22 DIAGNOSIS — I2510 Atherosclerotic heart disease of native coronary artery without angina pectoris: Secondary | ICD-10-CM | POA: Diagnosis not present

## 2016-09-22 DIAGNOSIS — M4854XA Collapsed vertebra, not elsewhere classified, thoracic region, initial encounter for fracture: Secondary | ICD-10-CM | POA: Diagnosis not present

## 2016-09-22 DIAGNOSIS — E7800 Pure hypercholesterolemia, unspecified: Secondary | ICD-10-CM | POA: Insufficient documentation

## 2016-09-22 DIAGNOSIS — I5023 Acute on chronic systolic (congestive) heart failure: Secondary | ICD-10-CM | POA: Insufficient documentation

## 2016-09-22 DIAGNOSIS — I4819 Other persistent atrial fibrillation: Secondary | ICD-10-CM

## 2016-09-22 DIAGNOSIS — F419 Anxiety disorder, unspecified: Secondary | ICD-10-CM | POA: Diagnosis not present

## 2016-09-22 DIAGNOSIS — Z85828 Personal history of other malignant neoplasm of skin: Secondary | ICD-10-CM | POA: Insufficient documentation

## 2016-09-22 DIAGNOSIS — I110 Hypertensive heart disease with heart failure: Secondary | ICD-10-CM | POA: Diagnosis not present

## 2016-09-22 DIAGNOSIS — Z5181 Encounter for therapeutic drug level monitoring: Secondary | ICD-10-CM | POA: Diagnosis not present

## 2016-09-22 DIAGNOSIS — I5032 Chronic diastolic (congestive) heart failure: Secondary | ICD-10-CM

## 2016-09-22 LAB — CBC
HCT: 42.5 % (ref 36.0–46.0)
HEMOGLOBIN: 13.9 g/dL (ref 12.0–15.0)
MCH: 29.6 pg (ref 26.0–34.0)
MCHC: 32.7 g/dL (ref 30.0–36.0)
MCV: 90.4 fL (ref 78.0–100.0)
Platelets: 514 10*3/uL — ABNORMAL HIGH (ref 150–400)
RBC: 4.7 MIL/uL (ref 3.87–5.11)
RDW: 16.4 % — ABNORMAL HIGH (ref 11.5–15.5)
WBC: 9.7 10*3/uL (ref 4.0–10.5)

## 2016-09-22 LAB — BASIC METABOLIC PANEL
Anion gap: 5 (ref 5–15)
BUN: 12 mg/dL (ref 6–20)
CHLORIDE: 109 mmol/L (ref 101–111)
CO2: 26 mmol/L (ref 22–32)
CREATININE: 0.99 mg/dL (ref 0.44–1.00)
Calcium: 8 mg/dL — ABNORMAL LOW (ref 8.9–10.3)
GFR calc non Af Amer: 56 mL/min — ABNORMAL LOW (ref >60)
Glucose, Bld: 110 mg/dL — ABNORMAL HIGH (ref 65–99)
Potassium: 4.2 mmol/L (ref 3.5–5.1)
SODIUM: 140 mmol/L (ref 135–145)

## 2016-09-22 LAB — I-STAT TROPONIN, ED
TROPONIN I, POC: 0 ng/mL (ref 0.00–0.08)
Troponin i, poc: 0 ng/mL (ref 0.00–0.08)

## 2016-09-22 LAB — POCT INR: INR: 1.9

## 2016-09-22 LAB — BRAIN NATRIURETIC PEPTIDE: B Natriuretic Peptide: 107.2 pg/mL — ABNORMAL HIGH (ref 0.0–100.0)

## 2016-09-22 MED ORDER — FUROSEMIDE 10 MG/ML IJ SOLN
40.0000 mg | Freq: Once | INTRAMUSCULAR | Status: AC
  Administered 2016-09-22: 40 mg via INTRAVENOUS
  Filled 2016-09-22: qty 4

## 2016-09-22 MED ORDER — NITROGLYCERIN 2 % TD OINT
1.0000 [in_us] | TOPICAL_OINTMENT | Freq: Once | TRANSDERMAL | Status: AC
  Administered 2016-09-22: 1 [in_us] via TOPICAL
  Filled 2016-09-22: qty 1

## 2016-09-22 NOTE — ED Provider Notes (Signed)
[facility] DEPT Provider Note   CSN: [id number] Arrival date & time: 09/22/16  1551     History   Chief Complaint Chief Complaint  Patient presents with  . Hypertension    HPI [name] is a 71 y.o. female.  HPI Pt has been having worsening shortness of breath over the last couple of weeks.  SHe has noticed increased edema and has been having some intermittent pain on the left side of the chest.  That has been going off and on for the last couple of weeks.  She mentioned these sx at the coumadin clinic and they suggested she come to the ED Past Medical History:  Diagnosis Date  . CAD (coronary artery disease)    a. LHC 3/17: mLAD 80, D1 50, pRCA 30, EF 25-35%; PCI: 3.5 x 16 mm Synergy DES to mLAD  . Chronic systolic CHF (congestive heart failure) ([place])    a. Echo 12/16: Mild concentric LVH, EF 25-30%, anteroseptal akinesis, inferoseptal akinesis, anterior akinesis, trivial AI, mild MR, moderate LAE, trivial TR, trivial PI, PASP 33 mmHg  //  b. Echo 2/17: Mild LVH, EF 25-30%, diffuse HK, mild LAE  //  c. Echo 7/17: mild LVH, EF 50-55%, no RWMA, trivial AI, mild to mod LAE, mild RAE  . Febrile seizure ([place]) 2004   "during TTP thing; cause my temp was 106"  . High cholesterol   . History of blood transfusion 2004   "several; related to TTP"  . Hypertension   . Hypothyroidism   . Ischemic cardiomyopathy   . Morbid obesity ([place]) 01/07/2013  . Osteoarthritis of back   . Paroxysmal atrial fibrillation ([place]) 08/01/2011   a. started on Sotalol during 10/2015 admission (Tikosyn too expensive)  . Pneumonia ?1999  . Scoliosis   . Thyroid nodule    "grew back after 2 partial thyroid surgeries" (09/29/2015)  . 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 Hyperlipidemia 08/06/2015  . Persistent atrial fibrillation ([place]) 04/18/2015  . CAD (coronary artery disease) 04/18/2015  . Cardiomyopathy, ischemic  04/18/2015  . Hypertensive heart disease with heart failure ([place]) 04/15/2015  . Anxiety 04/15/2015  . Chronic anticoagulation 04/15/2015  . Encounter for therapeutic drug monitoring 01/21/2015  . Dyspnea on exertion 01/05/2015  . Morbid obesity ([place]) 01/07/2013  . Benign essential HTN 08/01/2011  . 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 ABDOMINAL EXPLORATION SURGERY     "opened me up 2-3 times for blocked intestines"  . APPENDECTOMY    . BACK SURGERY    . BASAL CELL CARCINOMA EXCISION Right    "neck"  . CARDIAC CATHETERIZATION N/A 04/17/2015   Procedure: Left Heart Cath and Coronary Angiography;  Surgeon: Jettie Booze, MD;  Location: [facility];  Service: Cardiovascular;  Laterality: N/A;  . CARDIAC CATHETERIZATION  04/17/2015   Procedure: Coronary Stent Intervention;  Surgeon: Jettie Booze, MD;  Location: [facility];  Service: Cardiovascular;;  . CARDIOVERSION  06/2002; 03/2005; 05/2005   Archie Endo 06/16/2010  . CARDIOVERSION N/A 10/03/2015   Procedure: CARDIOVERSION;  Surgeon: Deboraha Sprang, MD;  Location: [facility];  Service: Cardiovascular;  Laterality: N/A;  . CATARACT EXTRACTION W/ INTRAOCULAR LENS  IMPLANT, BILATERAL Bilateral   . CHOLECYSTECTOMY OPEN    . CORONARY ANGIOPLASTY    . DILATION AND CURETTAGE OF UTERUS     S/P "miscarriage"  .  FOOT SURGERY Right    "for nerve damage"  . SALIVARY STONE REMOVAL Left    "had tumor wrapped around it"  . SPLENECTOMY, TOTAL     Archie Endo 06/16/2010  . TEE WITHOUT CARDIOVERSION N/A 09/30/2015   Procedure: TRANSESOPHAGEAL ECHOCARDIOGRAM (TEE);  Surgeon: Skeet Latch, MD;  Location: [facility];  Service: Cardiovascular;  Laterality: N/A;  . THORACIC DISCECTOMY  05/2002  . THYROIDECTOMY, PARTIAL  X 2   "grew back"  . TONSILLECTOMY    . TUBAL LIGATION    . TUMOR EXCISION     "benign tumor in my neck"  . VAGINAL HYSTERECTOMY      OB  History    No data available       Home Medications    Prior to Admission medications   Medication Sig Start Date End Date Taking? Authorizing Provider  benazepril (LOTENSIN) 10 MG tablet TAKE 1 TABLET BY MOUTH EVERY DAY 05/09/16  Yes Jerline Pain, MD  Cholecalciferol (VITAMIN D3) 2000 units capsule Take 2,000 Units by mouth every morning.   Yes [provider]  clopidogrel (PLAVIX) 75 MG tablet TAKE 1 TABLET (75 MG TOTAL) BY MOUTH DAILY WITH BREAKFAST. 05/09/16  Yes Jerline Pain, MD  furosemide (LASIX) 20 MG tablet TAKE 1 TABLET BY MOUTH EVERY DAY 07/13/16  Yes Jerline Pain, MD  isosorbide mononitrate (IMDUR) 30 MG 24 hr tablet TAKE 1 TABLET BY MOUTH EVERY DAY 07/13/16  Yes Jerline Pain, MD  levothyroxine (SYNTHROID, LEVOTHROID) 50 MCG tablet Take 50 mcg by mouth daily before breakfast.   Yes [provider]  metoprolol succinate (TOPROL-XL) 100 MG 24 hr tablet Take 1 tablet (100 mg total) by mouth as directed. Take 1 tab = (100 mg) in the AM and 1/2 tab = (50 mg)in the PM Patient taking differently: Take 100 mg by mouth every morning.  08/06/15  Yes Weaver, Scott T, PA-C  sotalol (BETAPACE) 120 MG tablet TAKE 1 TABLET BY MOUTH TWICE A DAY 08/26/16  Yes Camnitz, Will Hassell Done, MD  warfarin (COUMADIN) 5 MG tablet Take as directed by Coumadin Clinic 05/10/16  Yes Jerline Pain, MD  atorvastatin (LIPITOR) 80 MG tablet Take 1 tablet (80 mg total) by mouth daily at 6 PM. Patient not taking: Reported on 09/22/2016 10/04/15   Eileen Stanford, PA-C  nitroGLYCERIN (NITROSTAT) 0.4 MG SL tablet Place 1 tablet (0.4 mg total) under the tongue every 5 (five) minutes as needed for chest pain. 01/08/15   Almyra Deforest, PA    Family History Family History  Problem Relation Age of Onset  . Heart attack Mother   . Cancer Sister   . Cancer Brother     Social History Social History  Substance Use Topics  . Smoking status: Never Smoker  . Smokeless tobacco: Never Used  . Alcohol use No      Allergies   Ceftriaxone; Levofloxacin; Levofloxacin; Oxycodone; Tape; Alendronate; Aspirin; Oxycodone-acetaminophen; Oxycodone-acetaminophen; Penicillin g; and Penicillins   Review of Systems Review of Systems  All other systems reviewed and are negative.    Physical Exam Updated Vital Signs BP (!) 173/82   Pulse (!) 54   Temp 97.9 F (36.6 C) (Oral)   Resp 17   Wt 127 kg (280 lb)   SpO2 98%   BMI 48.06 kg/m   Physical Exam  Constitutional: She appears well-developed and well-nourished. No distress.  Obese  HENT:  Head: Normocephalic and atraumatic.  Right Ear: External ear normal.  Left Ear: External  ear normal.  Eyes: Conjunctivae are normal. Right eye exhibits no discharge. Left eye exhibits no discharge. No scleral icterus.  Neck: Neck supple. No tracheal deviation present.  Cardiovascular: Normal rate, regular rhythm and intact distal pulses.   Pulmonary/Chest: Effort normal and breath sounds normal. No stridor. No respiratory distress. She has no wheezes. She has no rales.  Abdominal: Soft. Bowel sounds are normal. She exhibits no distension. There is no tenderness. There is no rebound and no guarding.  Musculoskeletal: She exhibits edema. She exhibits no tenderness.  Neurological: She is alert. She has normal strength. No cranial nerve deficit (no facial droop, extraocular movements intact, no slurred speech) or sensory deficit. She exhibits normal muscle tone. She displays no seizure activity. Coordination normal.  Skin: Skin is warm and dry. No rash noted.  Psychiatric: She has a normal mood and affect.  Nursing note and vitals reviewed.    ED Treatments / Results  Labs (all labs ordered are listed, but only abnormal results are displayed) Labs Reviewed  BASIC METABOLIC PANEL - Abnormal; Notable for the following:       Result Value   Glucose, Bld 110 (*)    Calcium 8.0 (*)    GFR calc non Af Amer 56 (*)    All other components within normal limits   CBC - Abnormal; Notable for the following:    RDW 16.4 (*)    Platelets 514 (*)    All other components within normal limits  BRAIN NATRIURETIC PEPTIDE  PROTIME-INR  I-STAT TROPONIN, ED  I-STAT TROPONIN, ED    EKG  EKG Interpretation  Date/Time:  Thursday September 22 2016 16:40:41 EDT Ventricular Rate:  50 PR Interval:  156 QRS Duration: 86 QT Interval:  458 QTC Calculation: 417 R Axis:   72 Text Interpretation:  Sinus bradycardia with sinus arrhythmia Low voltage QRS Cannot rule out Anterior infarct , age undetermined Abnormal ECG No old tracing to compare Confirmed by Dorie Rank [phone]) on 09/22/2016 8:32:17 PM       Radiology Dg Chest 2 View  Result Date: 09/22/2016 CLINICAL DATA:  Hypertensive.  Intermittent chest pain for 2 weeks. EXAM: CHEST  2 VIEW COMPARISON:  Chest radiograph October 01, 2015 FINDINGS: The cardiac silhouette is mildly enlarged and unchanged. Tortuous aorta associated with hypertension. Mildly calcified aortic knob. Fullness of pulmonary hila with mild interstitial prominence. No pleural effusion or focal consolidation. No pneumothorax. Osteopenia. Old lower thoracic compression fractures. IMPRESSION: Mild cardiomegaly. Mild interstitial prominence concerning for pulmonary edema. Electronically Signed   By: Elon Alas M.D.   On: 09/22/2016 17:13    Procedures Procedures (including critical care time)  Medications Ordered in ED Medications  furosemide (LASIX) injection 40 mg (40 mg Intravenous Given 09/22/16 2113)  nitroGLYCERIN (NITROGLYN) 2 % ointment 1 inch (1 inch Topical Given 09/22/16 2113)     Initial Impression / Assessment and Plan / ED Course  I have reviewed the triage vital signs and the nursing notes.  Pertinent labs & imaging results that were available during my care of the patient were reviewed by me and considered in my medical decision making (see chart for details).   patient presents to emergency room with increasing  shortness of breath, dyspnea on exertion, weight gain and fatigue.  Concerning for chf. Patient was initially hypertensive in the emergency room. Chest x-ray showing pulmonary edema.  Laboratory tests are otherwise reassuring although repeat troponin and BNP is pending.  Patient is concerned about her dyspnea. She does not  feel like she can carry on her daily activities. I think it's reasonable to bring her in overnight for diuresis and further treatment.  Final Clinical Impressions(s) / ED Diagnoses   Final diagnoses:  Acute on chronic congestive heart failure, unspecified heart failure type [facility])      Dorie Rank, MD 09/22/16 2227

## 2016-09-22 NOTE — ED Triage Notes (Signed)
Patient at the coumadin clinic today and coumadin level 1.9. Patient states recently having high blood pressure and it was high at the clinic.  Clinic spoke with Doctors office instructed to go to the ED for evaluation.  Patient states also been having chest pain for 2 weeks intermittently denies chest pain or shortness of breath now.

## 2016-09-22 NOTE — ED Notes (Signed)
Delay in lab collect, pt on bedside commode

## 2016-09-22 NOTE — H&P (Signed)
History and Physical    [name]:[id number] DOB: 12-30-1945 DOA: 09/22/2016  PCP: Elisabeth Cara, PA-C Consultants:  Ellyn Hack - cardiology; Ronnald Ramp - endocrinology; Curt Bears - EP Patient coming from: Home - lives alone; NOK: Daughter, [phone]  Chief Complaint: SOB, edema  HPI: [name] is a 71 y.o. female with medical history significant of remote TTP; PAF on Sotalol and AC; morbid obesity; hypothyroidism; HTN; HLD; CAD; and and chronic systolic CHF (8/11 Echo with EF improved to 50-55% from 25-30% in 2/17) presenting with "fluid in my chest and feet", SOB.  She went to the Coumadin Clinic and told them her symptoms; her BP was high and they sent her to the ER. Symptoms have been present for 2 weeks but very bad for the last few days.  A few weeks ago she couldn't go to church because she couldn't wear shoes due to pedal edema.  It got a little better but not a lot.  Some left-sided chest pain with radiation up into the left neck and jaw.   SOB with exertion, very bad the last 3 days.  Also winded with excessive conversation.  Slight gagging cough.  1 pillow orthopnea chronically, slightly worse.  No PND.     ED Course:  CHF by symptoms, CXR with pulmonary edema.  No hypoxia.  Review of Systems: As per HPI; otherwise review of systems reviewed and negative.   Ambulatory Status:   Ambulates without assistance  Past Medical History:  Diagnosis Date  . CAD (coronary artery disease)    a. LHC 3/17: mLAD 80, D1 50, pRCA 30, EF 25-35%; PCI: 3.5 x 16 mm Synergy DES to mLAD  . Chronic systolic CHF (congestive heart failure) ([place])    a. Echo 12/16: Mild concentric LVH, EF 25-30%, anteroseptal akinesis, inferoseptal akinesis, anterior akinesis, trivial AI, mild MR, moderate LAE, trivial TR, trivial PI, PASP 33 mmHg  //  b. Echo 2/17: Mild LVH, EF 25-30%, diffuse HK, mild LAE  //  c. Echo 7/17: mild LVH, EF 50-55%, no RWMA, trivial AI, mild to mod LAE, mild RAE  .  Febrile seizure ([place]) 2004   "during TTP thing; cause my temp was 106"  . High cholesterol   . History of blood transfusion 2004   "several; related to TTP"  . Hypertension   . Hypothyroidism   . Ischemic cardiomyopathy   . Morbid obesity ([place]) 01/07/2013  . Osteoarthritis of back   . Paroxysmal atrial fibrillation ([place]) 08/01/2011   a. started on Sotalol during 10/2015 admission (Tikosyn too expensive)  . Pneumonia ?1999  . Scoliosis   . Thyroid nodule    "grew back after 2 partial thyroid surgeries" (09/29/2015)  . 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 ABDOMINAL EXPLORATION SURGERY     "opened me up 2-3 times for blocked intestines"  . APPENDECTOMY    . BACK SURGERY    . BASAL CELL CARCINOMA EXCISION Right    "neck"  . CARDIAC CATHETERIZATION N/A 04/17/2015   Procedure: Left Heart Cath and Coronary Angiography;  Surgeon: Jettie Booze, MD;  Location: [facility];  Service: Cardiovascular;  Laterality: N/A;  . CARDIAC CATHETERIZATION  04/17/2015   Procedure: Coronary Stent Intervention;  Surgeon: Jettie Booze, MD;  Location: [facility];  Service: Cardiovascular;;  . CARDIOVERSION  06/2002; 03/2005; 05/2005   Archie Endo 06/16/2010  . CARDIOVERSION N/A 10/03/2015   Procedure: CARDIOVERSION;  Surgeon: Deboraha Sprang, MD;  Location: [facility];  Service: Cardiovascular;  Laterality: N/A;  . CATARACT EXTRACTION W/ INTRAOCULAR LENS  IMPLANT, BILATERAL Bilateral   . CHOLECYSTECTOMY OPEN    . CORONARY ANGIOPLASTY    . DILATION AND CURETTAGE OF UTERUS     S/P "miscarriage"  . FOOT SURGERY Right    "for nerve damage"  . SALIVARY STONE REMOVAL Left    "had tumor wrapped around it"  . SPLENECTOMY, TOTAL     Archie Endo 06/16/2010  . TEE WITHOUT CARDIOVERSION N/A 09/30/2015   Procedure: TRANSESOPHAGEAL ECHOCARDIOGRAM (TEE);  Surgeon: Skeet Latch, MD;  Location: [facility];  Service: Cardiovascular;   Laterality: N/A;  . THORACIC DISCECTOMY  05/2002  . THYROIDECTOMY, PARTIAL  X 2   "grew back"  . TONSILLECTOMY    . TUBAL LIGATION    . TUMOR EXCISION     "benign tumor in my neck"  . VAGINAL HYSTERECTOMY      Social History   Social History  . Marital status: Married    Spouse name: N/A  . Number of children: N/A  . Years of education: N/A   Occupational History  . retired    Social History Main Topics  . Smoking status: Never Smoker  . Smokeless tobacco: Never Used  . Alcohol use No  . Drug use: No  . Sexual activity: No   Other Topics Concern  . Not on file   Social History Narrative  . No narrative on file    Allergies  Allergen Reactions  . Ceftriaxone Rash    "TTP"/Was hospitalized and in a coma for 60 days, per patient  . Levofloxacin Other (See Comments)    Dropped blood platelets extremely low  . Levofloxacin     Other reaction(s): Thrombocytopenia (intolerance)  . Oxycodone Itching  . Tape Other (See Comments)    Prefers paper or cloth tape  . Alendronate Rash  . Aspirin Other (See Comments)    Patient stated she is unable to take due to blood thinner  . Oxycodone-Acetaminophen Itching  . Oxycodone-Acetaminophen Itching  . Penicillin G Rash  . Penicillins Rash and Other (See Comments)    Cannot tolerate any "-CILLINS" Has patient had a PCN reaction causing immediate rash, facial/tongue/throat swelling, SOB or lightheadedness with hypotension: Yes Has patient had a PCN reaction causing severe rash involving mucus membranes or skin necrosis: No Has patient had a PCN reaction that required hospitalization No Has patient had a PCN reaction occurring within the last 10 years: Yes If all of the above answers are "NO", then may proceed with Cephalosporin use. Causes WELTS!!    Family History  Problem Relation Age of Onset  . Heart attack Mother   . Cancer Sister   . Cancer Brother     Prior to Admission medications   Medication Sig Start Date  End Date Taking? Authorizing Provider  benazepril (LOTENSIN) 10 MG tablet TAKE 1 TABLET BY MOUTH EVERY DAY 05/09/16  Yes Jerline Pain, MD  Cholecalciferol (VITAMIN D3) 2000 units capsule Take 2,000 Units by mouth every morning.   Yes [provider]  clopidogrel (PLAVIX) 75 MG tablet TAKE 1 TABLET (75 MG TOTAL) BY MOUTH DAILY WITH BREAKFAST. 05/09/16  Yes Jerline Pain, MD  furosemide (LASIX) 20 MG tablet TAKE 1 TABLET BY MOUTH EVERY DAY 07/13/16  Yes Jerline Pain, MD  isosorbide mononitrate (IMDUR) 30 MG 24 hr tablet TAKE 1 TABLET BY MOUTH EVERY DAY 07/13/16  Yes [name]  C, MD  levothyroxine (SYNTHROID, LEVOTHROID) 50 MCG tablet Take 50 mcg by mouth daily before breakfast.   Yes [provider]  metoprolol succinate (TOPROL-XL) 100 MG 24 hr tablet Take 1 tablet (100 mg total) by mouth as directed. Take 1 tab = (100 mg) in the AM and 1/2 tab = (50 mg)in the PM Patient taking differently: Take 100 mg by mouth every morning.  08/06/15  Yes Weaver, Scott T, PA-C  sotalol (BETAPACE) 120 MG tablet TAKE 1 TABLET BY MOUTH TWICE A DAY 08/26/16  Yes Camnitz, Will Hassell Done, MD  warfarin (COUMADIN) 5 MG tablet Take as directed by Coumadin Clinic 05/10/16  Yes Jerline Pain, MD  atorvastatin (LIPITOR) 80 MG tablet Take 1 tablet (80 mg total) by mouth daily at 6 PM. Patient not taking: Reported on 09/22/2016 10/04/15   Eileen Stanford, PA-C  nitroGLYCERIN (NITROSTAT) 0.4 MG SL tablet Place 1 tablet (0.4 mg total) under the tongue every 5 (five) minutes as needed for chest pain. 01/08/15   Almyra Deforest, PA    Physical Exam: Vitals:   09/22/16 1650 09/22/16 2021 09/22/16 2130 09/22/16 2245  BP:  (!) 138/116 (!) 173/82   Pulse:  (!) 58 (!) 54 (!) 53  Resp:  19 17 18   Temp:  97.9 F (36.6 C)    TempSrc:  Oral    SpO2:  100% 98% 96%  Weight: 127 kg (280 lb)        General:   Appears calm and comfortable and is NAD on RA Eyes:  PERRL, EOMI, normal lids, iris ENT:  grossly normal hearing,  lips & tongue, mmm; appropriate dentition Neck:  no LAD, masses or thyromegaly; no carotid bruits Cardiovascular:  Mild bradycardia, no m/r/g. 2+ LE edema.  Respiratory:   CTA bilaterally with no wheezes/rales/rhonchi, possibly very mild crackles in bases but particularly left lung base.  Normal respiratory effort. Abdomen:  soft, NT, ND, NABS Back:   normal alignment, no CVAT Skin:  no rash or induration seen on limited exam Musculoskeletal:  grossly normal tone BUE/BLE, good ROM, no bony abnormality Psychiatric:  blunted mood and affect, speech fluent and appropriate, AOx3 Neurologic:  CN 2-12 grossly intact, moves all extremities in coordinated fashion, sensation intact    Radiological Exams on Admission: Dg Chest 2 View  Result Date: 09/22/2016 CLINICAL DATA:  Hypertensive.  Intermittent chest pain for 2 weeks. EXAM: CHEST  2 VIEW COMPARISON:  Chest radiograph October 01, 2015 FINDINGS: The cardiac silhouette is mildly enlarged and unchanged. Tortuous aorta associated with hypertension. Mildly calcified aortic knob. Fullness of pulmonary hila with mild interstitial prominence. No pleural effusion or focal consolidation. No pneumothorax. Osteopenia. Old lower thoracic compression fractures. IMPRESSION: Mild cardiomegaly. Mild interstitial prominence concerning for pulmonary edema. Electronically Signed   By: Elon Alas M.D.   On: 09/22/2016 17:13    EKG: Independently reviewed.  Sinus bradycardia with rate 50; low voltage, nonspecific ST changes with no evidence of acute ischemia   Labs on Admission: I have personally reviewed the available labs and imaging studies at the time of the admission.  Pertinent labs:   Troponin 0.00 x 2 BNP 107.2, prior 126.2 on 01/15/15 Glucose 110 INR 1.9   Assessment/Plan Principal Problem:   CHF exacerbation ([place]) Active Problems:   Benign essential HTN   Morbid obesity (H[facility])   Chronic anticoagulation   Persistent atrial fibrillation  ([facility])   Hyperlipidemia   Hyperglycemia   Hypothyroidism   Acute on chronic systolic heart failure -  Patient without smoking history or prior h/o respiratory failure presenting with worsening SOB and edema -CXR consistent with pulmonary edema -Minimally elevated BNP -Rate controlled and in fact with bradycardia to 50 on presentation - this may be related to her taking both sotalol and metoprolol (will hold metoprolol for now, and she may need a lower dose of this medication when resumed) -With symptoms of CHF and abnl CXR, mild CHF exacerbation seems most likely diagnosis -She has received Lasix in the ER with good diuresis and is already feeling better -Will place in observation status with telemetry -Will request echocardiogram -Will start ASA (patient previously reported that she cannot take this medication due to anticoagulation) -Will continue Benazepril -Beta blockers as above -CHF order set utilized; may need CHF team consult but will hold until Echo results are available -Was given Lasix 40 mg x 1 in ER and will repeat with 20 mg BID -prn [place] O2 -Normal kidney function at this time, will follow -Repeat EKG in AM -Will not further r/o with serial troponins given 2 negative values and no further symptoms  HTN -Continue Lotensin, Sotalol for now -Consider resumption of Metoprolol at lower dose  HLD -Patient has not been taking her Lipitor -Will check FLP and resume Lipitor  Hyperglycemia --May be stress response -Will follow with fasting AM labs  Afib on Coumadin -Rate controlled -Continue Coumadin, pharmacy to dose  Morbid obesity -OHS may be contributing to SOB  Hypothyroidism -Check TSH -Continue Synthroid at current dose for now   DVT prophylaxis: Coumadin Code Status:  DNR - confirmed with patient Family Communication: None present Disposition Plan:  Home once clinically improved Consults called: None - consider cardiology if Echo has worsened  Admission  status: It is my clinical opinion that referral for OBSERVATION is reasonable and necessary in this patient based on the above information provided. The aforementioned taken together are felt to place the patient at high risk for further clinical deterioration. However it is anticipated that the patient may be medically stable for discharge from the hospital within 24 to 48 hours.    Karmen Bongo MD [facility]  If note is complete, please contact covering daytime or nighttime physician. [email or web address] Password [email or web address]  09/23/2016, 1:11 AM

## 2016-09-22 NOTE — ED Notes (Signed)
Pt reports nose bleeds - wants MD to know

## 2016-09-22 NOTE — ED Notes (Signed)
Daughter [name] cell [phone], work [phone]

## 2016-09-22 NOTE — ED Notes (Signed)
Pt refusing IV access where veins are best in [facility]. Only other venous access sites small, unable to carry adult sized venous catheter. Placed 24 G IV in R hand as pt was agreeable to this site.

## 2016-09-23 ENCOUNTER — Other Ambulatory Visit: Payer: Self-pay

## 2016-09-23 ENCOUNTER — Encounter (HOSPITAL_COMMUNITY): Payer: Self-pay | Admitting: Internal Medicine

## 2016-09-23 DIAGNOSIS — I481 Persistent atrial fibrillation: Secondary | ICD-10-CM | POA: Diagnosis not present

## 2016-09-23 DIAGNOSIS — I5031 Acute diastolic (congestive) heart failure: Secondary | ICD-10-CM | POA: Diagnosis not present

## 2016-09-23 DIAGNOSIS — I110 Hypertensive heart disease with heart failure: Secondary | ICD-10-CM | POA: Diagnosis not present

## 2016-09-23 DIAGNOSIS — Z7901 Long term (current) use of anticoagulants: Secondary | ICD-10-CM | POA: Diagnosis not present

## 2016-09-23 DIAGNOSIS — I10 Essential (primary) hypertension: Secondary | ICD-10-CM | POA: Diagnosis not present

## 2016-09-23 DIAGNOSIS — I5032 Chronic diastolic (congestive) heart failure: Secondary | ICD-10-CM

## 2016-09-23 DIAGNOSIS — R739 Hyperglycemia, unspecified: Secondary | ICD-10-CM | POA: Diagnosis present

## 2016-09-23 DIAGNOSIS — I509 Heart failure, unspecified: Secondary | ICD-10-CM | POA: Diagnosis not present

## 2016-09-23 DIAGNOSIS — E039 Hypothyroidism, unspecified: Secondary | ICD-10-CM

## 2016-09-23 HISTORY — DX: Chronic diastolic (congestive) heart failure: I50.32

## 2016-09-23 LAB — PROTIME-INR
INR: 1.6
Prothrombin Time: 19.3 seconds — ABNORMAL HIGH (ref 11.4–15.2)

## 2016-09-23 LAB — TSH: TSH: 3.711 u[IU]/mL (ref 0.350–4.500)

## 2016-09-23 LAB — LIPID PANEL
CHOL/HDL RATIO: 4.8 ratio
Cholesterol: 178 mg/dL (ref 0–200)
HDL: 37 mg/dL — ABNORMAL LOW (ref >40)
LDL CALC: 123 mg/dL — AB (ref 0–99)
Triglycerides: 90 mg/dL (ref <150)
VLDL: 18 mg/dL (ref 0–40)

## 2016-09-23 MED ORDER — SOTALOL HCL 80 MG PO TABS
120.0000 mg | ORAL_TABLET | Freq: Two times a day (BID) | ORAL | Status: DC
  Administered 2016-09-23 – 2016-09-24 (×3): 120 mg via ORAL
  Filled 2016-09-23: qty 1
  Filled 2016-09-23: qty 2
  Filled 2016-09-23: qty 1
  Filled 2016-09-23: qty 2

## 2016-09-23 MED ORDER — WARFARIN SODIUM 7.5 MG PO TABS
7.5000 mg | ORAL_TABLET | Freq: Once | ORAL | Status: AC
  Administered 2016-09-23: 7.5 mg via ORAL
  Filled 2016-09-23 (×2): qty 1

## 2016-09-23 MED ORDER — CLOPIDOGREL BISULFATE 75 MG PO TABS
75.0000 mg | ORAL_TABLET | Freq: Every day | ORAL | Status: DC
  Administered 2016-09-23 – 2016-09-24 (×2): 75 mg via ORAL
  Filled 2016-09-23 (×2): qty 1

## 2016-09-23 MED ORDER — ATORVASTATIN CALCIUM 80 MG PO TABS: 80.0000 mg | ORAL_TABLET | Freq: Every day | ORAL | Status: DC

## 2016-09-23 MED ORDER — WARFARIN - PHARMACIST DOSING INPATIENT
Freq: Every day | Status: DC
  Administered 2016-09-23: 1

## 2016-09-23 MED ORDER — ACETAMINOPHEN 325 MG PO TABS: 650.0000 mg | ORAL_TABLET | ORAL | Status: DC | PRN

## 2016-09-23 MED ORDER — LEVOTHYROXINE SODIUM 50 MCG PO TABS
50.0000 ug | ORAL_TABLET | Freq: Every day | ORAL | Status: DC
  Administered 2016-09-23: 50 ug via ORAL
  Filled 2016-09-23 (×2): qty 1

## 2016-09-23 MED ORDER — SODIUM CHLORIDE 0.9 % IV SOLN: 250.0000 mL | INTRAVENOUS | Status: DC | PRN

## 2016-09-23 MED ORDER — ONDANSETRON HCL 4 MG/2ML IJ SOLN: 4.0000 mg | Freq: Four times a day (QID) | INTRAMUSCULAR | Status: DC | PRN

## 2016-09-23 MED ORDER — WARFARIN SODIUM 7.5 MG PO TABS
7.5000 mg | ORAL_TABLET | ORAL | Status: AC
  Administered 2016-09-23: 7.5 mg via ORAL
  Filled 2016-09-23: qty 1

## 2016-09-23 MED ORDER — BENAZEPRIL HCL 10 MG PO TABS
10.0000 mg | ORAL_TABLET | Freq: Every day | ORAL | Status: DC
  Administered 2016-09-23 – 2016-09-24 (×2): 10 mg via ORAL
  Filled 2016-09-23 (×3): qty 1

## 2016-09-23 MED ORDER — SODIUM CHLORIDE 0.9% FLUSH: 3.0000 mL | INTRAVENOUS | Status: DC | PRN

## 2016-09-23 MED ORDER — DIPHENOXYLATE-ATROPINE 2.5-0.025 MG PO TABS
1.0000 | ORAL_TABLET | Freq: Once | ORAL | Status: DC
  Filled 2016-09-23: qty 1

## 2016-09-23 MED ORDER — ISOSORBIDE MONONITRATE ER 30 MG PO TB24
30.0000 mg | ORAL_TABLET | Freq: Every day | ORAL | Status: DC
Start: 2016-09-23 — End: 2016-09-24
  Administered 2016-09-23 – 2016-09-24 (×2): 30 mg via ORAL
  Filled 2016-09-23 (×2): qty 1

## 2016-09-23 MED ORDER — FUROSEMIDE 10 MG/ML IJ SOLN
20.0000 mg | Freq: Two times a day (BID) | INTRAMUSCULAR | Status: DC
  Administered 2016-09-23: 20 mg via INTRAVENOUS
  Filled 2016-09-23: qty 2

## 2016-09-23 MED ORDER — SODIUM CHLORIDE 0.9% FLUSH
3.0000 mL | Freq: Two times a day (BID) | INTRAVENOUS | Status: DC
  Administered 2016-09-23 – 2016-09-24 (×3): 3 mL via INTRAVENOUS

## 2016-09-23 MED ORDER — FUROSEMIDE 10 MG/ML IJ SOLN
20.0000 mg | Freq: Two times a day (BID) | INTRAMUSCULAR | Status: DC
  Administered 2016-09-24: 20 mg via INTRAVENOUS
  Filled 2016-09-23: qty 2

## 2016-09-23 MED ORDER — ASPIRIN EC 81 MG PO TBEC
81.0000 mg | DELAYED_RELEASE_TABLET | Freq: Every day | ORAL | Status: DC
  Administered 2016-09-23 – 2016-09-24 (×2): 81 mg via ORAL
  Filled 2016-09-23 (×2): qty 1

## 2016-09-23 MED ORDER — LEVOTHYROXINE SODIUM 50 MCG PO TABS
50.0000 ug | ORAL_TABLET | Freq: Every day | ORAL | Status: DC
  Administered 2016-09-24: 50 ug via ORAL
  Filled 2016-09-23: qty 1

## 2016-09-23 NOTE — ED Notes (Signed)
Pt given bedpan

## 2016-09-23 NOTE — Care Management Note (Signed)
Case Management Note  Patient Details  Name: [name] MRN: [id number] Date of Birth: July 13, 1945  Subjective/Objective:                   71 y.o. female with medical history significant of remote TTP; PAF; morbid obesity; hypothyroidism; HTN; HLD; CAD; and and chronic systolic CHF presenting with "fluid in my chest and feet", SOB.  She [facility] and told them her symptoms; her BP was high and they sent her to the ER. Symptoms have been present for 2 weeks but very bad for the last few days.  From home alone.  Action/Plan: Admit status OBSERVATION (Acute on chronic systolic heart failure); anticipate discharge [place].   Expected Discharge Date:      UNKNOWN            Expected Discharge Plan:  [place]  In-House Referral:  NA  Discharge planning Services  CM Consult   Status of Service:  In process, will continue to follow    Additional Comments:  Fuller Mandril, RN 09/23/2016, 11:17 AM

## 2016-09-23 NOTE — Progress Notes (Signed)
PROGRESS NOTE    [name]   FYB:[id number]  DOB: 10-May-1945  DOA: 09/22/2016 PCP: Elisabeth Cara, PA-C   Brief Narrative:  [name]  71 y.o. female with medical history significant of remote TTP; PAF on Sotalol and AC; morbid obesity; hypothyroidism; HTN; HLD; CAD; and and chronic systolic CHF (5/27 Echo with EF improved to 50-55% from 25-30% in 2/17). She presents with dyspnea on exertion and a mild dry cough. She has had pedal edema on and off as well.    Subjective: Breathing better. Having loose stool each time she urinates, at least 3 x today ROS: no complaints of nausea, vomiting, constipation diarrhea, cough, dyspnea or dysuria. No other complaints.   Assessment & Plan:   Principal Problem:   CHF exacerbation- unspecified - symptoms improving- cont Lasix for tonight- ambulatory pulse ox tomorrow AM - f/u repeat ECHO  Active Problems:   Benign essential HTN - Metoprolol held- on Imdur, Sotalol and Benazepril    Morbid obesity Body mass index is 48.51 kg/m.    Chronic anticoagulation   Persistent atrial fibrillation  -cont Coumadin and Sotalol- HR 50-60    Hyperlipidemia - LDL 123     Hypothyroidism - TSH normal   DVT prophylaxis: coumadin Code Status: DNR Family Communication:  Disposition Plan:  Consultants:    Procedures:    Antimicrobials:  Anti-infectives    None       Objective: Vitals:   09/23/16 0600 09/23/16 0800 09/23/16 1000 09/23/16 1330  BP: (!) 119/53 110/74 123/89   Pulse: (!) 53 (!) 59 71 60  Resp: 17 16 15  (!) 21  Temp:      TempSrc:      SpO2: 95% 95% 96% 95%  Weight:        Intake/Output Summary (Last 24 hours) at 09/23/16 1402 Last data filed at 09/23/16 0658  Gross per 24 hour  Intake                0 ml  Output              750 ml  Net             -750 ml   Filed Weights   09/22/16 1650 09/23/16 0120  Weight: 127 kg (280 lb) 128.2 kg (282 lb 10.1 oz)    Examination: General exam:  Appears comfortable  HEENT: PERRLA, oral mucosa moist, no sclera icterus or thrush Respiratory system: Clear to auscultation. Respiratory effort normal. Cardiovascular system: S1 & S2 heard, RRR.  No murmurs  Gastrointestinal system: Abdomen soft, non-tender, nondistended. Normal bowel sound. No organomegaly Central nervous system: Alert and oriented. No focal neurological deficits. Extremities: No cyanosis, clubbing + pedal dema Skin: No rashes or ulcers Psychiatry:  Mood & affect appropriate.     Data Reviewed: I have personally reviewed following labs and imaging studies  CBC:  Recent Labs Lab 09/22/16 1638  WBC 9.7  HGB 13.9  HCT 42.5  MCV 90.4  PLT 782*   Basic Metabolic Panel:  Recent Labs Lab 09/22/16 1638  NA 140  K 4.2  CL 109  CO2 26  GLUCOSE 110*  BUN 12  CREATININE 0.99  CALCIUM 8.0*   GFR: Estimated Creatinine Clearance: 69.2 mL/min (by C-G formula based on SCr of 0.99 mg/dL). Liver Function Tests: No results for input(s): AST, ALT, ALKPHOS, BILITOT, PROT, ALBUMIN in the last 168 hours. No results for input(s): LIPASE, AMYLASE in the last 168 hours. No results for  input(s): AMMONIA in the last 168 hours. Coagulation Profile:  Recent Labs Lab 09/22/16 1522 09/23/16 0130  INR 1.9 1.60   Cardiac Enzymes: No results for input(s): CKTOTAL, CKMB, CKMBINDEX, TROPONINI in the last 168 hours. BNP (last 3 results) No results for input(s): PROBNP in the last 8760 hours. HbA1C: No results for input(s): HGBA1C in the last 72 hours. CBG: No results for input(s): GLUCAP in the last 168 hours. Lipid Profile:  Recent Labs  09/23/16 0912  CHOL 178  HDL 37*  LDLCALC 123*  TRIG 90  CHOLHDL 4.8   Thyroid Function Tests:  Recent Labs  09/23/16 0130  TSH 3.711   Anemia Panel: No results for input(s): VITAMINB12, FOLATE, FERRITIN, TIBC, IRON, RETICCTPCT in the last 72 hours. Urine analysis:    Component Value Date/Time   COLORURINE YELLOW  10/17/2008 0024   APPEARANCEUR CLEAR 10/17/2008 0024   LABSPEC 1.003 (L) 10/17/2008 0024   PHURINE 6.0 10/17/2008 0024   GLUCOSEU NEGATIVE 10/17/2008 0024   HGBUR NEGATIVE 10/17/2008 0024   BILIRUBINUR NEGATIVE 10/17/2008 0024   KETONESUR NEGATIVE 10/17/2008 0024   PROTEINUR NEGATIVE 10/17/2008 0024   UROBILINOGEN 0.2 10/17/2008 0024   NITRITE NEGATIVE 10/17/2008 0024   LEUKOCYTESUR  10/17/2008 0024    NEGATIVE MICROSCOPIC NOT DONE ON URINES WITH NEGATIVE PROTEIN, BLOOD, LEUKOCYTES, NITRITE, OR GLUCOSE <1000 mg/dL.   Sepsis Labs: @LABRCNTIP (procalcitonin:4,lacticidven:4) )No results found for this or any previous visit (from the past 240 hour(s)).       Radiology Studies: Dg Chest 2 View  Result Date: 09/22/2016 CLINICAL DATA:  Hypertensive.  Intermittent chest pain for 2 weeks. EXAM: CHEST  2 VIEW COMPARISON:  Chest radiograph October 01, 2015 FINDINGS: The cardiac silhouette is mildly enlarged and unchanged. Tortuous aorta associated with hypertension. Mildly calcified aortic knob. Fullness of pulmonary hila with mild interstitial prominence. No pleural effusion or focal consolidation. No pneumothorax. Osteopenia. Old lower thoracic compression fractures. IMPRESSION: Mild cardiomegaly. Mild interstitial prominence concerning for pulmonary edema. Electronically Signed   By: Elon Alas M.D.   On: 09/22/2016 17:13      Scheduled Meds: . aspirin EC  81 mg Oral Daily  . benazepril  10 mg Oral Daily  . clopidogrel  75 mg Oral Q breakfast  . [START ON 09/24/2016] furosemide  20 mg Intravenous Q12H  . isosorbide mononitrate  30 mg Oral Daily  . levothyroxine  50 mcg Oral QAC breakfast  . sodium chloride flush  3 mL Intravenous Q12H  . sotalol  120 mg Oral BID  . Warfarin - Pharmacist Dosing Inpatient   Does not apply q1800   Continuous Infusions: . sodium chloride       LOS: 0 days    Time spent in minutes: 35    Debbe Odea, MD [facility] Pager: [email or web address] Password [email or web address] 09/23/2016, 2:02 PM

## 2016-09-23 NOTE — Progress Notes (Signed)
ANTICOAGULATION CONSULT NOTE - Initial Consult  Pharmacy Consult for Coumadin Indication: atrial fibrillation  Allergies  Allergen Reactions  . Ceftriaxone Rash    "TTP"/Was hospitalized and in a coma for 60 days, per patient  . Levofloxacin Other (See Comments)    Dropped blood platelets extremely low  . Levofloxacin     Other reaction(s): Thrombocytopenia (intolerance)  . Oxycodone Itching  . Tape Other (See Comments)    Prefers paper or cloth tape  . Alendronate Rash  . Aspirin Other (See Comments)    Patient stated she is unable to take due to blood thinner  . Oxycodone-Acetaminophen Itching  . Oxycodone-Acetaminophen Itching  . Penicillin G Rash  . Penicillins Rash and Other (See Comments)    Cannot tolerate any "-CILLINS" Has patient had a PCN reaction causing immediate rash, facial/tongue/throat swelling, SOB or lightheadedness with hypotension: Yes Has patient had a PCN reaction causing severe rash involving mucus membranes or skin necrosis: No Has patient had a PCN reaction that required hospitalization No Has patient had a PCN reaction occurring within the last 10 years: Yes If all of the above answers are "NO", then may proceed with Cephalosporin use. Causes WELTS!!    Patient Measurements: Weight: 280 lb (127 kg)  Vital Signs: Temp: 97.9 F (36.6 C) (08/23 2021) Temp Source: Oral (08/23 2021) BP: 173/82 (08/23 2130) Pulse Rate: 53 (08/23 2245)  Labs:  Recent Labs  09/22/16 1522 09/22/16 1638  HGB  --  13.9  HCT  --  42.5  PLT  --  514*  INR 1.9  --   CREATININE  --  0.99    Estimated Creatinine Clearance: 68.8 mL/min (by C-G formula based on SCr of 0.99 mg/dL).   Medical History: Past Medical History:  Diagnosis Date  . CAD (coronary artery disease)    a. LHC 3/17: mLAD 80, D1 50, pRCA 30, EF 25-35%; PCI: 3.5 x 16 mm Synergy DES to mLAD  . Chronic systolic CHF (congestive heart failure) ([place])    a. Echo 12/16: Mild concentric LVH, EF 25-30%,  anteroseptal akinesis, inferoseptal akinesis, anterior akinesis, trivial AI, mild MR, moderate LAE, trivial TR, trivial PI, PASP 33 mmHg  //  b. Echo 2/17: Mild LVH, EF 25-30%, diffuse HK, mild LAE  //  c. Echo 7/17: mild LVH, EF 50-55%, no RWMA, trivial AI, mild to mod LAE, mild RAE  . Febrile seizure ([place]) 2004   "during TTP thing; cause my temp was 106"  . High cholesterol   . History of blood transfusion 2004   "several; related to TTP"  . Hypertension   . Hypothyroidism   . Ischemic cardiomyopathy   . Morbid obesity ([place]) 01/07/2013  . Osteoarthritis of back   . Paroxysmal atrial fibrillation ([place]) 08/01/2011   a. started on Sotalol during 10/2015 admission (Tikosyn too expensive)  . Pneumonia ?1999  . Scoliosis   . Thyroid nodule    "grew back after 2 partial thyroid surgeries" (09/29/2015)  . 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  Goal of Therapy:  INR 2-3   Plan:  Will give Coumadin 7.5mg  now as directed by outpatient clinic and monitor INR for dose adjustments.  Charleston Ropes  Pamalee Leyden, PharmD, BCPS  09/23/2016,1:17 AM

## 2016-09-23 NOTE — Evaluation (Signed)
Physical Therapy Evaluation Patient Details Name: [name] MRN: [id number] DOB: [birth year] Today's Date: 09/23/2016   History of Present Illness  Pt is a 71 yo female admitted through ED on 09/22/16 from coumadin clinic with c/o fluid on her chest. BP was elevated and pt had L chest pain and bilateral pedal edema x 3 days. Pt was diagnose with acute on chronic CHF exacerbation. PMH significant for TTP, HTN, PAF, obesity, hypothyroid, HLD, CAD.  Clinical Impression  Pt presents with the above diagnosis for therapy evaluation. Prior to admission, pt lived with her niece in a single level home and was completely independent with mobility around the home. Pt's niece was assisting with household chores and errands. Pt is able to perform mobility with Mod I to independent this session with no significant functional decline noted. Pt has no skilled acute needs at this time. PT will sign off. Please re-order if any new needs arise.     Follow Up Recommendations No PT follow up    Equipment Recommendations  None recommended by PT    Recommendations for Other Services       Precautions / Restrictions Precautions Precautions: None Restrictions Weight Bearing Restrictions: No      Mobility  Bed Mobility Overal bed mobility: Independent             General bed mobility comments: pt is able to get out of bed without use of rails and no hands on assistance  Transfers Overall transfer level: Independent               General transfer comment: Pt is able to stand from EOB without any assistance and minimal use of UE's  Ambulation/Gait Ambulation/Gait assistance: Modified independent (Device/Increase time) Ambulation Distance (Feet): 50 Feet Assistive device: None Gait Pattern/deviations: Step-through pattern;Wide base of support;Decreased stride length Gait velocity: approaching WNL Gait velocity interpretation: at or above normal speed for age/gender General Gait  Details: pt presents with decreased stride length, but no LOB or instability noted with gait. Average speed noted.   Stairs            Wheelchair Mobility    Modified Rankin (Stroke Patients Only)       Balance Overall balance assessment: No apparent balance deficits (not formally assessed)                                           Pertinent Vitals/Pain Pain Assessment: No/denies pain    Home Living Family/patient expects to be discharged to:: Private residence Living Arrangements: Other relatives;Other (Comment) (niece is living with her) Available Help at Discharge: Family;Available PRN/intermittently Type of Home: House Home Access: Level entry     Home Layout: One level Home Equipment: Shower seat;Bedside commode;Walker - 4 wheels      Prior Function Level of Independence: Needs assistance   Gait / Transfers Assistance Needed: gets around with out an AD in the home. used cart at grocery store in the past  ADL's / Homemaking Assistance Needed: Niece does grocery shopping, cooking and cleaning. Pt does all her own ADLs otherwise.         Hand Dominance   Dominant Hand: Right    Extremity/Trunk Assessment   Upper Extremity Assessment Upper Extremity Assessment: Overall WFL for tasks assessed    Lower Extremity Assessment Lower Extremity Assessment: Overall WFL for tasks assessed    Cervical /  Trunk Assessment Cervical / Trunk Assessment: Kyphotic  Communication   Communication: No difficulties  Cognition Arousal/Alertness: Awake/alert Behavior During Therapy: WFL for tasks assessed/performed Overall Cognitive Status: Within Functional Limits for tasks assessed                                        General Comments General comments (skin integrity, edema, etc.): O2 sats 98% following short distance gait with no dyspnea noted    Exercises     Assessment/Plan    PT Assessment Patent does not need any further  PT services  PT Problem List         PT Treatment Interventions      PT Goals (Current goals can be found in the Care Plan section)  Acute Rehab PT Goals Patient Stated Goal: to get home tomorrow PT Goal Formulation: All assessment and education complete, DC therapy    Frequency     Barriers to discharge        Co-evaluation               AM-PAC PT "6 Clicks" Daily Activity  Outcome Measure Difficulty turning over in bed (including adjusting bedclothes, sheets and blankets)?: None Difficulty moving from lying on back to sitting on the side of the bed? : None Difficulty sitting down on and standing up from a chair with arms (e.g., wheelchair, bedside commode, etc,.)?: None Help needed moving to and from a bed to chair (including a wheelchair)?: None Help needed walking in hospital room?: None Help needed climbing 3-5 steps with a railing? : None 6 Click Score: 24    End of Session Equipment Utilized During Treatment: Gait belt Activity Tolerance: Patient tolerated treatment well Patient left: in chair;with call bell/phone within reach Nurse Communication: Mobility status PT Visit Diagnosis: Unsteadiness on feet (R26.81)    Time: 9924-2683 PT Time Calculation (min) (ACUTE ONLY): 17 min   Charges:   PT Evaluation $PT Eval Moderate Complexity: 1 Mod     PT G Codes:   PT G-Codes **NOT FOR INPATIENT CLASS** Functional Assessment Tool Used: AM-PAC 6 Clicks Basic Mobility Functional Limitation: Mobility: Walking and moving around Mobility: Walking and Moving Around Current Status (M1962): 0 percent impaired, limited or restricted Mobility: Walking and Moving Around Goal Status (I2979): 0 percent impaired, limited or restricted Mobility: Walking and Moving Around Discharge Status (G9211): 0 percent impaired, limited or restricted    Scheryl Marten PT, DPT  [phone]   Shanon Rosser 09/23/2016, 3:55 PM

## 2016-09-23 NOTE — Progress Notes (Signed)
ANTICOAGULATION CONSULT NOTE Pharmacy Consult for Coumadin Indication: atrial fibrillation  Patient Measurements: Weight: 282 lb 10.1 oz (128.2 kg)  Vital Signs: BP: 117/62 (08/24 1400) Pulse Rate: 51 (08/24 1400)  Labs:  Recent Labs  09/22/16 1522 09/22/16 1638 09/23/16 0130  HGB  --  13.9  --   HCT  --  42.5  --   PLT  --  514*  --   LABPROT  --   --  19.3*  INR 1.9  --  1.60  CREATININE  --  0.99  --     Estimated Creatinine Clearance: 69.2 mL/min (by C-G formula based on SCr of 0.99 mg/dL).  Assessment: 71yo female sent to ED by outpatient clinic for SOB and HTN, admitted for CHF exacerbation, to continue Coumadin for Afib. INR remains subtherapeutic at 1.6. No bleeding noted.  Goal of Therapy:  INR 2-3   Plan:  Repeat warfarin 7.5mg  PO x 1 tonight Daily INR  [name], PharmD, BCPS 09/23/2016 2:12 PM

## 2016-09-23 NOTE — ED Notes (Signed)
Admitting MD at bedside.

## 2016-09-23 NOTE — ED Notes (Signed)
Pt had large bowel movement.

## 2016-09-24 ENCOUNTER — Observation Stay (HOSPITAL_BASED_OUTPATIENT_CLINIC_OR_DEPARTMENT_OTHER): Payer: Medicare Other

## 2016-09-24 DIAGNOSIS — I481 Persistent atrial fibrillation: Secondary | ICD-10-CM | POA: Diagnosis not present

## 2016-09-24 DIAGNOSIS — I509 Heart failure, unspecified: Secondary | ICD-10-CM

## 2016-09-24 DIAGNOSIS — I110 Hypertensive heart disease with heart failure: Secondary | ICD-10-CM | POA: Diagnosis not present

## 2016-09-24 DIAGNOSIS — E039 Hypothyroidism, unspecified: Secondary | ICD-10-CM | POA: Diagnosis not present

## 2016-09-24 DIAGNOSIS — Z7901 Long term (current) use of anticoagulants: Secondary | ICD-10-CM | POA: Diagnosis not present

## 2016-09-24 DIAGNOSIS — I5031 Acute diastolic (congestive) heart failure: Secondary | ICD-10-CM

## 2016-09-24 DIAGNOSIS — I10 Essential (primary) hypertension: Secondary | ICD-10-CM

## 2016-09-24 LAB — ECHOCARDIOGRAM COMPLETE
AOASC: 33 cm
CHL CUP MV DEC (S): 310
E/e' ratio: 12.85
EWDT: 310 ms
FS: 35 % (ref 28–44)
Height: 64 in
IVS/LV PW RATIO, ED: 1.11
LA diam index: 1.83 cm/m2
LA vol A4C: 55.3 ml
LA vol: 72.3 mL
LASIZE: 45 mm
LAVOLIN: 29.4 mL/m2
LEFT ATRIUM END SYS DIAM: 45 mm
LV E/e' medial: 12.85
LV E/e'average: 12.85
LV PW d: 11.2 mm — AB (ref 0.6–1.1)
LV e' LATERAL: 6.42 cm/s
LVOT area: 3.14 cm2
LVOTD: 20 mm
MV pk A vel: 93.1 m/s
MV pk E vel: 82.5 m/s
MVAP: 2.42 cm2
MVPG: 3 mmHg
P 1/2 time: 91 ms
RV LATERAL S' VELOCITY: 14 cm/s
TAPSE: 26.2 mm
TDI e' lateral: 6.42
TDI e' medial: 5.55
Weight: 4443.2 oz

## 2016-09-24 LAB — BASIC METABOLIC PANEL
ANION GAP: 6 (ref 5–15)
BUN: 14 mg/dL (ref 6–20)
CHLORIDE: 106 mmol/L (ref 101–111)
CO2: 27 mmol/L (ref 22–32)
Calcium: 7.6 mg/dL — ABNORMAL LOW (ref 8.9–10.3)
Creatinine, Ser: 0.89 mg/dL (ref 0.44–1.00)
GFR calc Af Amer: >60 mL/min (ref >60)
GLUCOSE: 104 mg/dL — AB (ref 65–99)
POTASSIUM: 3.8 mmol/L (ref 3.5–5.1)
Sodium: 139 mmol/L (ref 135–145)

## 2016-09-24 LAB — CBC WITH DIFFERENTIAL/PLATELET
Basophils Absolute: 0.1 10*3/uL (ref 0.0–0.1)
Basophils Relative: 1 %
EOS ABS: 0.3 10*3/uL (ref 0.0–0.7)
Eosinophils Relative: 3 %
HEMATOCRIT: 40.1 % (ref 36.0–46.0)
HEMOGLOBIN: 13 g/dL (ref 12.0–15.0)
LYMPHS ABS: 4.7 10*3/uL — AB (ref 0.7–4.0)
Lymphocytes Relative: 45 %
MCH: 28.9 pg (ref 26.0–34.0)
MCHC: 32.4 g/dL (ref 30.0–36.0)
MCV: 89.1 fL (ref 78.0–100.0)
MONO ABS: 0.7 10*3/uL (ref 0.1–1.0)
MONOS PCT: 7 %
NEUTROS ABS: 4.5 10*3/uL (ref 1.7–7.7)
NEUTROS PCT: 44 %
Platelets: 471 10*3/uL — ABNORMAL HIGH (ref 150–400)
RBC: 4.5 MIL/uL (ref 3.87–5.11)
RDW: 16.2 % — AB (ref 11.5–15.5)
WBC: 10.4 10*3/uL (ref 4.0–10.5)

## 2016-09-24 LAB — PROTIME-INR
INR: 1.81
Prothrombin Time: 21.2 seconds — ABNORMAL HIGH (ref 11.4–15.2)

## 2016-09-24 MED ORDER — WARFARIN SODIUM 7.5 MG PO TABS: 7.5000 mg | ORAL_TABLET | Freq: Once | ORAL | Status: DC

## 2016-09-24 NOTE — Progress Notes (Signed)
ANTICOAGULATION CONSULT NOTE - Follow Up Consult  Pharmacy Consult for Cioumadin Indication: atrial fibrillation  Allergies  Allergen Reactions  . Ceftriaxone Rash    "TTP"/Was hospitalized and in a coma for 60 days, per patient  . Levofloxacin Other (See Comments)    Dropped blood platelets extremely low  . Levofloxacin     Other reaction(s): Thrombocytopenia (intolerance)  . Oxycodone Itching  . Tape Other (See Comments)    Prefers paper or cloth tape  . Alendronate Rash  . Aspirin Other (See Comments)    Patient stated she is unable to take due to blood thinner  . Oxycodone-Acetaminophen Itching  . Oxycodone-Acetaminophen Itching  . Penicillin G Rash  . Penicillins Rash and Other (See Comments)    Cannot tolerate any "-CILLINS" Has patient had a PCN reaction causing immediate rash, facial/tongue/throat swelling, SOB or lightheadedness with hypotension: Yes Has patient had a PCN reaction causing severe rash involving mucus membranes or skin necrosis: No Has patient had a PCN reaction that required hospitalization No Has patient had a PCN reaction occurring within the last 10 years: Yes If all of the above answers are "NO", then may proceed with Cephalosporin use. Causes WELTS!!    Patient Measurements: Height: 5\' 4"  (162.6 cm) Weight: 277 lb 11.2 oz (126 kg) (b scale) IBW/kg (Calculated) : 54.7   Vital Signs: Temp: 97.9 F (36.6 C) (08/25 1200) Temp Source: Oral (08/25 1200) BP: 152/71 (08/25 1200) Pulse Rate: 70 (08/25 1200)  Labs:  Recent Labs  09/22/16 1522 09/22/16 1638 09/23/16 0130 09/24/16 0020  HGB  --  13.9  --  13.0  HCT  --  42.5  --  40.1  PLT  --  514*  --  471*  LABPROT  --   --  19.3* 21.2*  INR 1.9  --  1.60 1.81  CREATININE  --  0.99  --  0.89    Estimated Creatinine Clearance: 76.1 mL/min (by C-G formula based on SCr of 0.89 mg/dL).  Assessment: Anticoag: Warfarin for hx afib - INR 1.6>1.81, H/H WNL, plts 514  - (PTA dose 7.5mg  SuWF,  5mg  all other days)  Goal of Therapy:  INR 2-3 Monitor platelets by anticoagulation protocol: Yes   Plan:  Repeat warfarin 7.5mg  PO x 1 tonight again Daily INR   Natalie Leclaire S. Alford Highland, PharmD, BCPS Clinical Staff Pharmacist Pager [phone]  Eilene Ghazi Stillinger 09/24/2016,2:11 PM

## 2016-09-24 NOTE — Discharge Summary (Signed)
Physician Discharge Summary  [name] XTK:[id number] DOB: Jul 16, 1945 DOA: 09/22/2016  PCP: Elisabeth Cara, PA-C  Admit date: 09/22/2016 Discharge date: 09/24/2016  Admitted From: home  Disposition:  home   Recommendations for Outpatient Follow-up:  1. Follow daily weights  Discharge Condition:  stable   CODE STATUS:  DNR   Consultations:  none    Discharge Diagnoses:  Principal Problem:   CHF exacerbation ([place]) Active Problems:   Benign essential HTN   Morbid obesity ([place])   Chronic anticoagulation   Persistent atrial fibrillation ([facility])   Hyperlipidemia   Hyperglycemia   Hypothyroidism    Subjective: Feels shortness of breath is much better. Ankle swelling is also significantly improved.   Brief Summary: [name] 71 y.o.femalewith medical history significant of remote TTP; PAF on Sotalol and AC with Coumadin, morbid obesity, hypothyroidism, HTN, HLD, CAD and chronic systolic CHF (9/92 Echo with EF improved to 50-55% from 25-30% in 2/17). She presents with dyspnea on exertion and a mild dry cough. She has had pedal edema on and off as well.   Hospital Course:  Principal Problem:   dCHF exacerbation  - symptoms improved with IV Lasix - ambulatory pulse ox in 90s today -  repeat ECHO shows grade 2 dCHF and normal EF  Active Problems:   Benign essential HTN - HR in 50s on admission- Metoprolol held (not mention on last EP note) - cont on Imdur, Sotalol and Benazepril    Morbid obesity Body mass index is 48.51 kg/m.    Chronic anticoagulation   Persistent atrial fibrillation  -cont Coumadin and Sotalol- HR 50-60    Hyperlipidemia - LDL 123- CONT STATIN     Hypothyroidism - TSH normal    Discharge Instructions  Discharge Instructions    Diet - low sodium heart healthy    Complete by:  As directed    Increase activity slowly    Complete by:  As directed      Allergies as of 09/24/2016      Reactions   Ceftriaxone  Rash   "TTP"/Was hospitalized and in a coma for 60 days, per patient   Levofloxacin Other (See Comments)   Dropped blood platelets extremely low   Levofloxacin    Other reaction(s): Thrombocytopenia (intolerance)   Oxycodone Itching   Tape Other (See Comments)   Prefers paper or cloth tape   Alendronate Rash   Aspirin Other (See Comments)   Patient stated she is unable to take due to blood thinner   Oxycodone-acetaminophen Itching   Oxycodone-acetaminophen Itching   Penicillin G Rash   Penicillins Rash, Other (See Comments)   Cannot tolerate any "-CILLINS" Has patient had a PCN reaction causing immediate rash, facial/tongue/throat swelling, SOB or lightheadedness with hypotension: Yes Has patient had a PCN reaction causing severe rash involving mucus membranes or skin necrosis: No Has patient had a PCN reaction that required hospitalization No Has patient had a PCN reaction occurring within the last 10 years: Yes If all of the above answers are "NO", then may proceed with Cephalosporin use. Causes WELTS!!      Medication List    STOP taking these medications   metoprolol succinate 100 MG 24 hr tablet Commonly known as:  TOPROL-XL     TAKE these medications   atorvastatin 80 MG tablet Commonly known as:  LIPITOR Take 1 tablet (80 mg total) by mouth daily at 6 PM.   benazepril 10 MG tablet Commonly known as:  LOTENSIN TAKE 1 TABLET  BY MOUTH EVERY DAY   clopidogrel 75 MG tablet Commonly known as:  PLAVIX TAKE 1 TABLET (75 MG TOTAL) BY MOUTH DAILY WITH BREAKFAST.   furosemide 20 MG tablet Commonly known as:  LASIX TAKE 1 TABLET BY MOUTH EVERY DAY   isosorbide mononitrate 30 MG 24 hr tablet Commonly known as:  IMDUR TAKE 1 TABLET BY MOUTH EVERY DAY   levothyroxine 50 MCG tablet Commonly known as:  SYNTHROID, LEVOTHROID Take 50 mcg by mouth daily before breakfast.   nitroGLYCERIN 0.4 MG SL tablet Commonly known as:  NITROSTAT Place 1 tablet (0.4 mg total) under  the tongue every 5 (five) minutes as needed for chest pain.   sotalol 120 MG tablet Commonly known as:  BETAPACE TAKE 1 TABLET BY MOUTH TWICE A DAY   Vitamin D3 2000 units capsule Take 2,000 Units by mouth every morning.   warfarin 5 MG tablet Commonly known as:  COUMADIN Take as directed by [facility]madin Clinic            Discharge Care Instructions        Start     Ordered   09/24/16 0000  Increase activity slowly     09/24/16 1515   09/24/16 0000  Diet - low sodium heart healthy     09/24/16 1515      Allergies  Allergen Reactions  . Ceftriaxone Rash    "TTP"/Was hospitalized and in a coma for 60 days, per patient  . Levofloxacin Other (See Comments)    Dropped blood platelets extremely low  . Levofloxacin     Other reaction(s): Thrombocytopenia (intolerance)  . Oxycodone Itching  . Tape Other (See Comments)    Prefers paper or cloth tape  . Alendronate Rash  . Aspirin Other (See Comments)    Patient stated she is unable to take due to blood thinner  . Oxycodone-Acetaminophen Itching  . Oxycodone-Acetaminophen Itching  . Penicillin G Rash  . Penicillins Rash and Other (See Comments)    Cannot tolerate any "-CILLINS" Has patient had a PCN reaction causing immediate rash, facial/tongue/throat swelling, SOB or lightheadedness with hypotension: Yes Has patient had a PCN reaction causing severe rash involving mucus membranes or skin necrosis: No Has patient had a PCN reaction that required hospitalization No Has patient had a PCN reaction occurring within the last 10 years: Yes If all of the above answers are "NO", then may proceed with Cephalosporin use. Causes WELTS!!     Procedures/Studies: 2 D ECHO Study Conclusions  - Left ventricle: The cavity size was normal. There was mild   concentric hypertrophy. Systolic function was normal. The   estimated ejection fraction was in the range of 55% to 60%. Wall   motion was normal; there were no regional wall  motion   abnormalities. Features are consistent with a pseudonormal left   ventricular filling pattern, with concomitant abnormal relaxation   and increased filling pressure (grade 2 diastolic dysfunction).   Doppler parameters are consistent with high ventricular filling   pressure. - Aortic valve: Mildly calcified annulus. Trileaflet; normal   thickness, mildly calcified leaflets. There was trivial   regurgitation. - Mitral valve: There was trivial regurgitation. Valve area by   pressure half-time: 2.42 cm^2. - Left atrium: The atrium was mildly dilated. Anterior-posterior   dimension: 45 mm. - Pulmonic valve: There was trivial regurgitation. - Pericardium, extracardiac: A trivial pericardial effusion was   identified posterior to the heart.   Dg Chest 2 View  Result Date: 09/22/2016 CLINICAL  DATA:  Hypertensive.  Intermittent chest pain for 2 weeks. EXAM: CHEST  2 VIEW COMPARISON:  Chest radiograph October 01, 2015 FINDINGS: The cardiac silhouette is mildly enlarged and unchanged. Tortuous aorta associated with hypertension. Mildly calcified aortic knob. Fullness of pulmonary hila with mild interstitial prominence. No pleural effusion or focal consolidation. No pneumothorax. Osteopenia. Old lower thoracic compression fractures. IMPRESSION: Mild cardiomegaly. Mild interstitial prominence concerning for pulmonary edema. Electronically Signed   By: Elon Alas M.D.   On: 09/22/2016 17:13       Discharge Exam: Vitals:   09/24/16 0717 09/24/16 1200  BP: (!) 162/56 (!) 152/71  Pulse: 60 70  Resp: 18 18  Temp:  97.9 F (36.6 C)  SpO2: 98% 98%   Vitals:   09/23/16 2227 09/24/16 0223 09/24/16 0717 09/24/16 1200  BP: (!) 123/56 110/63 (!) 162/56 (!) 152/71  Pulse:  67 60 70  Resp: 16  18 18   Temp: 98.5 F (36.9 C) 98.4 F (36.9 C)  97.9 F (36.6 C)  TempSrc: Oral Oral  Oral  SpO2: 95% 95% 98% 98%  Weight:   126 kg (277 lb 11.2 oz)   Height:        General: Pt is  alert, awake, not in acute distress Cardiovascular: RRR, S1/S2 +, no rubs, no gallops Respiratory: CTA bilaterally, no wheezing, no rhonchi Abdominal: Soft, NT, ND, bowel sounds + Extremities: no edema, no cyanosis    The results of significant diagnostics from this hospitalization (including imaging, microbiology, ancillary and laboratory) are listed below for reference.     Microbiology: No results found for this or any previous visit (from the past 240 hour(s)).   Labs: BNP (last 3 results)  Recent Labs  09/22/16 2229  BNP 983.3*   Basic Metabolic Panel:  Recent Labs Lab 09/22/16 1638 09/24/16 0020  NA 140 139  K 4.2 3.8  CL 109 106  CO2 26 27  GLUCOSE 110* 104*  BUN 12 14  CREATININE 0.99 0.89  CALCIUM 8.0* 7.6*   Liver Function Tests: No results for input(s): AST, ALT, ALKPHOS, BILITOT, PROT, ALBUMIN in the last 168 hours. No results for input(s): LIPASE, AMYLASE in the last 168 hours. No results for input(s): AMMONIA in the last 168 hours. CBC:  Recent Labs Lab 09/22/16 1638 09/24/16 0020  WBC 9.7 10.4  NEUTROABS  --  4.5  HGB 13.9 13.0  HCT 42.5 40.1  MCV 90.4 89.1  PLT 514* 471*   Cardiac Enzymes: No results for input(s): CKTOTAL, CKMB, CKMBINDEX, TROPONINI in the last 168 hours. BNP: Invalid input(s): POCBNP CBG: No results for input(s): GLUCAP in the last 168 hours. D-Dimer No results for input(s): DDIMER in the last 72 hours. Hgb A1c No results for input(s): HGBA1C in the last 72 hours. Lipid Profile  Recent Labs  09/23/16 0912  CHOL 178  HDL 37*  LDLCALC 123*  TRIG 90  CHOLHDL 4.8   Thyroid function studies  Recent Labs  09/23/16 0130  TSH 3.711   Anemia work up No results for input(s): VITAMINB12, FOLATE, FERRITIN, TIBC, IRON, RETICCTPCT in the last 72 hours. Urinalysis    Component Value Date/Time   COLORURINE YELLOW 10/17/2008 0024   APPEARANCEUR CLEAR 10/17/2008 0024   LABSPEC 1.003 (L) 10/17/2008 0024   PHURINE  6.0 10/17/2008 0024   GLUCOSEU NEGATIVE 10/17/2008 0024   HGBUR NEGATIVE 10/17/2008 0024   BILIRUBINUR NEGATIVE 10/17/2008 0024   KETONESUR NEGATIVE 10/17/2008 0024   PROTEINUR NEGATIVE 10/17/2008 0024   UROBILINOGEN  0.2 10/17/2008 0024   NITRITE NEGATIVE 10/17/2008 0024   LEUKOCYTESUR  10/17/2008 0024    NEGATIVE MICROSCOPIC NOT DONE ON URINES WITH NEGATIVE PROTEIN, BLOOD, LEUKOCYTES, NITRITE, OR GLUCOSE <1000 mg/dL.   Sepsis Labs Invalid input(s): PROCALCITONIN,  WBC,  LACTICIDVEN Microbiology No results found for this or any previous visit (from the past 240 hour(s)).   Time coordinating discharge: Over 30 minutes  SIGNED:   Debbe Odea, MD  [facility] 09/24/2016, 3:17 PM Pager   If 7PM-7AM, please contact night-coverage [email or web address] Password [email or web address]

## 2016-09-24 NOTE — Evaluation (Addendum)
Occupational Therapy Evaluation Patient Details Name: [name] MRN: [id number] DOB: [birth year] Today's Date: 09/24/2016    History of Present Illness Pt is a 71 y.o. female admitted through ED on 09/22/16 from coumadin clinic with c/o fluid on her chest. BP was elevated and pt had L chest pain and bilateral pedal edema x 3 days. PMH significant for TTP, HTN, PAF, obesity, hypothyroid, HLD, CAD.   Clinical Impression   Pt admitted with above. Pt independent with ADLs, PTA. Feel pt will benefit from acute OT to increase strength and reinforce energy conservation techniques prior to d/c.     Follow Up Recommendations  No OT follow up    Equipment Recommendations  None recommended by OT    Recommendations for Other Services       Precautions / Restrictions Precautions Precautions: Fall Restrictions Weight Bearing Restrictions: No      Mobility Bed Mobility Overal bed mobility: Independent (supine to sit)                Transfers Overall transfer level: Independent                    Balance   Unsteady with ambulation in hallway.                                         ADL either performed or assessed with clinical judgement   ADL Overall ADL's : Needs assistance/impaired     Grooming: Supervision/safety;Standing;Set up               Lower Body Dressing: Set up;Supervision/safety;Sit to/from stand   Toilet Transfer: Supervision/safety;Ambulation;Regular Toilet   Toileting- Clothing Manipulation and Hygiene: Modified independent;Sit to/from stand       Functional mobility during ADLs: Min guard (Min guard for ambulation in hallway and some in room.) General ADL Comments: Educated on energy conservation. Pt SOB in hallway-cues given to take break. Pt SOB in hallway-cues given to take break.     Vision         Perception     Praxis      Pertinent Vitals/Pain Pain Assessment: Faces Faces Pain Scale: Hurts  little more Pain Location: back and head Pain Descriptors / Indicators: Grimacing;Guarding Pain Intervention(s): Monitored during session     Hand Dominance     Extremity/Trunk Assessment Upper Extremity Assessment Upper Extremity Assessment: Generalized weakness (weakness in bilateral shoulder flexors)   Lower Extremity Assessment Lower Extremity Assessment: Generalized weakness       Communication Communication Communication: No difficulties   Cognition Arousal/Alertness: Awake/alert Behavior During Therapy: WFL for tasks assessed/performed Overall Cognitive Status: Within Functional Limits for tasks assessed                                     General Comments       Exercises Exercises: General Lower Extremity-reviewed LE exercise she can do while supine    Shoulder Instructions      Home Living Family/patient expects to be discharged to:: Private residence Living Arrangements: Other (Comment) (niece lives with her) Available Help at Discharge: Family;Available PRN/intermittently Type of Home: House Home Access: Level entry     Home Layout: One level     Bathroom Shower/Tub: Teacher, early years/pre: Handicapped height     Home Equipment:  Shower seat;Bedside commode;Walker - 4 wheels;Hand held shower head          Prior Functioning/Environment Level of Independence: Needs assistance  Gait / Transfers Assistance Needed: gets around without an AD in the home. used cart at grocery store in the past ADL's / Homemaking Assistance Needed: Niece does grocery shopping and cleaning. Pt does all her own ADLs otherwise.             OT Problem List: Decreased strength;Obesity;Pain;Decreased knowledge of use of DME or AE;Impaired balance (sitting and/or standing);Decreased activity tolerance      OT Treatment/Interventions: Self-care/ADL training;DME and/or AE instruction;Patient/family education;Balance training;Therapeutic  exercise;Energy conservation;Therapeutic activities    OT Goals(Current goals can be found in the care plan section) Acute Rehab OT Goals Patient Stated Goal: go home OT Goal Formulation: With patient/family Time For Goal Achievement: 10/01/16 Potential to Achieve Goals: Good ADL Goals Pt Will Perform Tub/Shower Transfer: Tub transfer;with set-up;ambulating;shower seat Additional ADL Goal #1: Pt will independently perform UE and LE exercises to increase strength. Additional ADL Goal #2: Pt will independently verbalize 3 energy conservation techniques. Additional ADL Goal #3: Pt will gather ADL items (to challenge balance) independently.   OT Frequency: Min 2X/week   Barriers to D/C:            Co-evaluation              AM-PAC PT "6 Clicks" Daily Activity     Outcome Measure Help from another person eating meals?: None Help from another person taking care of personal grooming?: A Little Help from another person toileting, which includes using toliet, bedpan, or urinal?: A Little Help from another person bathing (including washing, rinsing, drying)?: A Little Help from another person to put on and taking off regular upper body clothing?: A Little Help from another person to put on and taking off regular lower body clothing?: A Little 6 Click Score: 19   End of Session    Activity Tolerance: Patient tolerated treatment well Patient left: in bed;with family/visitor present  OT Visit Diagnosis: Unsteadiness on feet (R26.81);Muscle weakness (generalized) (M62.81)                Time: 1340-1402 OT Time Calculation (min): 22 min Charges:  OT General Charges $OT Visit: 1 Procedure OT Evaluation $OT Eval Moderate Complexity: 1 Procedure G-Codes: OT G-codes **NOT FOR INPATIENT CLASS** Functional Assessment Tool Used: Clinical judgement Functional Limitation: Self care Self Care Current Status (V7616): At least 1 percent but less than 20 percent impaired, limited or  restricted Self Care Goal Status (W7371): 0 percent impaired, limited or restricted   Benito Mccreedy OTR/L 09/24/2016, 2:26 PM

## 2016-09-24 NOTE — Progress Notes (Signed)
  Echocardiogram 2D Echocardiogram has been performed.  Hamp Moreland G Cattie Tineo 09/24/2016, 9:03 AM

## 2016-09-24 NOTE — Care Management Note (Signed)
Case Management Note  Patient Details  Name: [name] MRN: [id number] Date of Birth: 03-19-1945  Subjective/Objective:                 Patient with order to DC to home today. Chart reviewed. No Home Health or Equipment needs, no unacknowledged Case Management consults or medication needs identified at the time of this note. Plan for DC to home. If needs arise today prior to discharge, please call Carles Collet RN CM at [phone].    Action/Plan:   Expected Discharge Date:  09/24/16               Expected Discharge Plan:  [place]  In-House Referral:  NA  Discharge planning Services  CM Consult  Post Acute Care Choice:    Choice offered to:     DME Arranged:    DME Agency:     HH Arranged:    HH Agency:     Status of Service:  Completed, signed off  If discussed at [employer], dates discussed:    Additional Comments:  Carles Collet, RN 09/24/2016, 3:40 PM

## 2016-09-24 NOTE — Progress Notes (Signed)
SATURATION QUALIFICATIONS: (This note is used to comply with regulatory documentation for home oxygen)  Patient Saturations on Room Air at Rest = 99%  Patient Saturations on Room Air while Ambulating = 88%  Patient Saturations on 0 Liters of oxygen while Ambulating = 99%  Please briefly explain why patient needs home oxygen:  Pt began ambulating at 99%.  As she was walking, she desatted to 88%, but then quickly rose back up to 99%.

## 2016-09-24 NOTE — Progress Notes (Signed)
Orders received for pt discharge.  Discharge summary printed and reviewed with pt.  Explained medication regimen.  Per order, gave and explained CHF booklet,and educated pt on importance of recording daily weights.  Pt had no further questions at this time.  IV removed and site remains clean, dry, intact.  Telemetry removed.  Pt in stable condition and awaiting transport.

## 2016-09-24 NOTE — Discharge Instructions (Signed)
You have congestive heart failure WEIGH YOUR SELF DAILY. TAKE A DOSE OF LASIX 40 MG IF YOU GAIN > 2LB IN 24 HRS. KEEP YOUR ANKLES AT THE LEVEL OF YOU ABDOMEN OR HIGHER AND WEAR COMPRESSION STOCKINGS IF YOUR ANKLES ARE SWOLLEN.  DO NOT TAKE MORE THAN 2 GM OF SODIUM. DO NOT DRINK EXCESS LIQUIDS BECAUSE YOUR BODY TENDS TO HOLD ON TO FLUIDS.   Please take all your medications with you for your next visit with your Primary MD. Please request your Primary MD to go over all hospital test results at the follow up. Please ask your Primary MD to get all Hospital records sent to his/her office.  If you experience worsening of your admission symptoms, develop shortness of breath, chest pain, suicidal or homicidal thoughts or a life threatening emergency, you must seek medical attention immediately by calling 911 or calling your MD.  [name] must read the complete instructions/literature along with all the possible adverse reactions/side effects for all the medicines you take including new medications that have been prescribed to you. Take new medicines after you have completely understood and accpet all the possible adverse reactions/side effects.   Do not drive when taking pain medications or sedatives.    Do not take more than prescribed Pain, Sleep and Anxiety Medications  If you have smoked or chewed Tobacco in the last 2 yrs please stop. Stop any regular alcohol and or recreational drug use.  Wear Seat belts while driving.   Heart Failure Heart failure means your heart has trouble pumping blood. This makes it hard for your body to work well. Heart failure is usually a long-term (chronic) condition. You must take good care of yourself and follow your doctor's treatment plan. Follow these instructions at home:  Take your heart medicine as told by your doctor. ? Do not stop taking medicine unless your doctor tells you to. ? Do not skip any dose of medicine. ? Refill your medicines before they run  out. ? Take other medicines only as told by your doctor or pharmacist.  Stay active if told by your doctor. The elderly and people with severe heart failure should talk with a doctor about physical activity.  Eat heart-healthy foods. Choose foods that are without trans fat and are low in saturated fat, cholesterol, and salt (sodium). This includes fresh or frozen fruits and vegetables, fish, lean meats, fat-free or low-fat dairy foods, whole grains, and high-fiber foods. Lentils and dried peas and beans (legumes) are also good choices.  Limit salt if told by your doctor.  Cook in a healthy way. Roast, grill, broil, bake, poach, steam, or stir-fry foods.  Limit fluids as told by your doctor.  Weigh yourself every morning. Do this after you pee (urinate) and before you eat breakfast. Write down your weight to give to your doctor.  Take your blood pressure and write it down if your doctor tells you to.  Ask your doctor how to check your pulse. Check your pulse as told.  Lose weight if told by your doctor.  Stop smoking or chewing tobacco. Do not use gum or patches that help you quit without your doctor's approval.  Schedule and go to doctor visits as told.  Nonpregnant women should have no more than 1 drink a day. Men should have no more than 2 drinks a day. Talk to your doctor about drinking alcohol.  Stop illegal drug use.  Stay current with shots (immunizations).  Manage your health conditions as told by  your doctor.  Learn to manage your stress.  Rest when you are tired.  If it is really hot outside: ? Avoid intense activities. ? Use air conditioning or fans, or get in a cooler place. ? Avoid caffeine and alcohol. ? Wear loose-fitting, lightweight, and light-colored clothing.  If it is really cold outside: ? Avoid intense activities. ? Layer your clothing. ? Wear mittens or gloves, a hat, and a scarf when going outside. ? Avoid alcohol.  Learn about heart failure and  get support as needed.  Get help to maintain or improve your quality of life and your ability to care for yourself as needed. Contact a doctor if:  You gain weight quickly.  You are more short of breath than usual.  You cannot do your normal activities.  You tire easily.  You cough more than normal, especially with activity.  You have any or more puffiness (swelling) in areas such as your hands, feet, ankles, or belly (abdomen).  You cannot sleep because it is hard to breathe.  You feel like your heart is beating fast (palpitations).  You get dizzy or light-headed when you stand up. Get help right away if:  You have trouble breathing.  There is a change in mental status, such as becoming less alert or not being able to focus.  You have chest pain or discomfort.  You faint. This information is not intended to replace advice given to you by your health care provider. Make sure you discuss any questions you have with your health care provider. Document Released: 10/27/2007 Document Revised: 06/25/2015 Document Reviewed: 03/05/2012 Elsevier Interactive Patient Education  2017 [employer].

## 2016-09-29 ENCOUNTER — Other Ambulatory Visit: Payer: Self-pay | Admitting: Physician Assistant

## 2016-09-29 DIAGNOSIS — I255 Ischemic cardiomyopathy: Secondary | ICD-10-CM

## 2016-09-30 ENCOUNTER — Other Ambulatory Visit: Payer: Self-pay | Admitting: Cardiology

## 2016-10-06 ENCOUNTER — Ambulatory Visit (INDEPENDENT_AMBULATORY_CARE_PROVIDER_SITE_OTHER): Payer: Medicare Other | Admitting: *Deleted

## 2016-10-06 DIAGNOSIS — I481 Persistent atrial fibrillation: Secondary | ICD-10-CM

## 2016-10-06 DIAGNOSIS — Z5181 Encounter for therapeutic drug level monitoring: Secondary | ICD-10-CM

## 2016-10-06 DIAGNOSIS — I4819 Other persistent atrial fibrillation: Secondary | ICD-10-CM

## 2016-10-06 LAB — POCT INR: INR: 2.2

## 2016-10-27 ENCOUNTER — Ambulatory Visit (INDEPENDENT_AMBULATORY_CARE_PROVIDER_SITE_OTHER): Payer: Medicare Other | Admitting: *Deleted

## 2016-10-27 DIAGNOSIS — Z5181 Encounter for therapeutic drug level monitoring: Secondary | ICD-10-CM

## 2016-10-27 DIAGNOSIS — I481 Persistent atrial fibrillation: Secondary | ICD-10-CM | POA: Diagnosis not present

## 2016-10-27 DIAGNOSIS — I4819 Other persistent atrial fibrillation: Secondary | ICD-10-CM

## 2016-10-27 LAB — POCT INR: INR: 2.6

## 2016-10-28 ENCOUNTER — Other Ambulatory Visit: Payer: Self-pay | Admitting: Physician Assistant

## 2016-10-31 NOTE — Telephone Encounter (Signed)
Medication Detail    Disp Refills Start End   sotalol (BETAPACE) 120 MG tablet 60 tablet 10 08/26/2016    Sig: TAKE 1 TABLET BY MOUTH TWICE A DAY   Sent to pharmacy as: sotalol (BETAPACE) 120 MG tablet   E-Prescribing Status: Receipt confirmed by pharmacy (08/26/2016 7:10 AM EDT)   Pharmacy   [id number] - [place], [place] - [address]

## 2016-11-08 ENCOUNTER — Other Ambulatory Visit: Payer: Self-pay | Admitting: Cardiology

## 2016-11-24 ENCOUNTER — Ambulatory Visit (INDEPENDENT_AMBULATORY_CARE_PROVIDER_SITE_OTHER): Payer: Medicare Other | Admitting: *Deleted

## 2016-11-24 DIAGNOSIS — I481 Persistent atrial fibrillation: Secondary | ICD-10-CM | POA: Diagnosis not present

## 2016-11-24 DIAGNOSIS — I4819 Other persistent atrial fibrillation: Secondary | ICD-10-CM

## 2016-11-24 DIAGNOSIS — Z5181 Encounter for therapeutic drug level monitoring: Secondary | ICD-10-CM | POA: Diagnosis not present

## 2016-11-24 LAB — POCT INR: INR: 3.5

## 2016-12-27 ENCOUNTER — Ambulatory Visit (INDEPENDENT_AMBULATORY_CARE_PROVIDER_SITE_OTHER): Payer: Medicare Other | Admitting: *Deleted

## 2016-12-27 DIAGNOSIS — Z5181 Encounter for therapeutic drug level monitoring: Secondary | ICD-10-CM

## 2016-12-27 DIAGNOSIS — I4819 Other persistent atrial fibrillation: Secondary | ICD-10-CM

## 2016-12-27 DIAGNOSIS — I481 Persistent atrial fibrillation: Secondary | ICD-10-CM | POA: Diagnosis not present

## 2016-12-27 LAB — POCT INR: INR: 2.6

## 2016-12-27 NOTE — Patient Instructions (Signed)
Continue same dose Coumadin 5mg  (1 tablet) daily except 7.5 mg (1.5 tablets) on Sundays, Wednesdays and Fridays. Recheck INR 4 weeks. Call us with any new medications or concerns [phone].

## 2017-01-27 ENCOUNTER — Ambulatory Visit (INDEPENDENT_AMBULATORY_CARE_PROVIDER_SITE_OTHER): Payer: Medicare Other | Admitting: *Deleted

## 2017-01-27 DIAGNOSIS — Z5181 Encounter for therapeutic drug level monitoring: Secondary | ICD-10-CM

## 2017-01-27 DIAGNOSIS — I481 Persistent atrial fibrillation: Secondary | ICD-10-CM | POA: Diagnosis not present

## 2017-01-27 DIAGNOSIS — I4819 Other persistent atrial fibrillation: Secondary | ICD-10-CM

## 2017-01-27 LAB — POCT INR: INR: 2.2

## 2017-01-27 NOTE — Patient Instructions (Signed)
Description   Continue same dose Coumadin 5mg (1 tablet) daily except 7.5 mg (1.5 tablets) on Sundays, Wednesdays and Fridays. Recheck INR 5 weeks. Call us with any new medications or concerns #[phone].     

## 2017-02-05 ENCOUNTER — Other Ambulatory Visit: Payer: Self-pay | Admitting: Cardiology

## 2017-02-17 ENCOUNTER — Other Ambulatory Visit: Payer: Self-pay | Admitting: Cardiology

## 2017-03-03 ENCOUNTER — Ambulatory Visit (INDEPENDENT_AMBULATORY_CARE_PROVIDER_SITE_OTHER): Payer: Medicare Other | Admitting: *Deleted

## 2017-03-03 DIAGNOSIS — Z5181 Encounter for therapeutic drug level monitoring: Secondary | ICD-10-CM

## 2017-03-03 DIAGNOSIS — I481 Persistent atrial fibrillation: Secondary | ICD-10-CM

## 2017-03-03 DIAGNOSIS — I4819 Other persistent atrial fibrillation: Secondary | ICD-10-CM

## 2017-03-03 LAB — POCT INR: INR: 2.9

## 2017-03-03 NOTE — Patient Instructions (Signed)
Description   Continue same dose Coumadin 5mg (1 tablet) daily except 7.5 mg (1.5 tablets) on Sundays, Wednesdays and Fridays. Recheck INR 6 weeks. Call us with any new medications or concerns #[phone].     

## 2017-03-27 ENCOUNTER — Other Ambulatory Visit: Payer: Self-pay | Admitting: Cardiology

## 2017-04-07 ENCOUNTER — Other Ambulatory Visit: Payer: Self-pay | Admitting: Cardiology

## 2017-04-17 ENCOUNTER — Ambulatory Visit (INDEPENDENT_AMBULATORY_CARE_PROVIDER_SITE_OTHER): Payer: Medicare Other | Admitting: *Deleted

## 2017-04-17 DIAGNOSIS — I481 Persistent atrial fibrillation: Secondary | ICD-10-CM

## 2017-04-17 DIAGNOSIS — I4819 Other persistent atrial fibrillation: Secondary | ICD-10-CM

## 2017-04-17 DIAGNOSIS — Z7901 Long term (current) use of anticoagulants: Secondary | ICD-10-CM

## 2017-04-17 DIAGNOSIS — Z5181 Encounter for therapeutic drug level monitoring: Secondary | ICD-10-CM | POA: Diagnosis not present

## 2017-04-17 LAB — POCT INR: INR: 3.5

## 2017-04-17 NOTE — Patient Instructions (Signed)
Description   Do not take coumadin today March 18th then continue same dose Coumadin 5mg  (1 tablet) daily except 7.5 mg (1.5 tablets) on Sundays, Wednesdays and Fridays. Recheck INR 2 weeks. Call us with any new medications or concerns [phone].

## 2017-04-25 ENCOUNTER — Other Ambulatory Visit: Payer: Self-pay | Admitting: Cardiology

## 2017-05-01 ENCOUNTER — Encounter: Payer: Self-pay | Admitting: Cardiology

## 2017-05-01 ENCOUNTER — Ambulatory Visit (INDEPENDENT_AMBULATORY_CARE_PROVIDER_SITE_OTHER): Payer: Medicare Other | Admitting: Cardiology

## 2017-05-01 ENCOUNTER — Ambulatory Visit (INDEPENDENT_AMBULATORY_CARE_PROVIDER_SITE_OTHER): Payer: Medicare Other | Admitting: *Deleted

## 2017-05-01 VITALS — BP 140/90 | HR 55 | Ht 64.0 in | Wt 280.0 lb

## 2017-05-01 DIAGNOSIS — I2510 Atherosclerotic heart disease of native coronary artery without angina pectoris: Secondary | ICD-10-CM | POA: Diagnosis not present

## 2017-05-01 DIAGNOSIS — I4819 Other persistent atrial fibrillation: Secondary | ICD-10-CM

## 2017-05-01 DIAGNOSIS — I482 Chronic atrial fibrillation, unspecified: Secondary | ICD-10-CM

## 2017-05-01 DIAGNOSIS — Z79899 Other long term (current) drug therapy: Secondary | ICD-10-CM | POA: Diagnosis not present

## 2017-05-01 DIAGNOSIS — I481 Persistent atrial fibrillation: Secondary | ICD-10-CM

## 2017-05-01 DIAGNOSIS — Z5181 Encounter for therapeutic drug level monitoring: Secondary | ICD-10-CM

## 2017-05-01 DIAGNOSIS — I10 Essential (primary) hypertension: Secondary | ICD-10-CM

## 2017-05-01 LAB — POCT INR: INR: 2.5

## 2017-05-01 LAB — BASIC METABOLIC PANEL
BUN / CREAT RATIO: 16 (ref 12–28)
BUN: 14 mg/dL (ref 8–27)
CHLORIDE: 101 mmol/L (ref 96–106)
CO2: 22 mmol/L (ref 20–29)
CREATININE: 0.88 mg/dL (ref 0.57–1.00)
Calcium: 9 mg/dL (ref 8.7–10.3)
GFR calc Af Amer: 76 mL/min/{1.73_m2} (ref >59)
GFR calc non Af Amer: 66 mL/min/{1.73_m2} (ref >59)
GLUCOSE: 99 mg/dL (ref 65–99)
POTASSIUM: 4.4 mmol/L (ref 3.5–5.2)
SODIUM: 141 mmol/L (ref 134–144)

## 2017-05-01 LAB — MAGNESIUM: MAGNESIUM: 2.2 mg/dL (ref 1.6–2.3)

## 2017-05-01 NOTE — Patient Instructions (Signed)
Description   Continue same dose Coumadin 5mg (1 tablet) daily except 7.5 mg (1.5 tablets) on Sundays, Wednesdays and Fridays. Recheck INR 4 weeks. Call us with any new medications or concerns #[phone].     

## 2017-05-01 NOTE — Progress Notes (Signed)
Electrophysiology Office Note   Date:  05/01/2017   ID:  RADA ZEGERS, DOB 11-Jan-1946, MRN [id number]  PCP:  Elisabeth Cara, PA-C  Cardiologist:  Marlou Porch Primary Electrophysiologist:  Constance Haw, MD    Chief Complaint  Patient presents with  . Follow-up    Persistent Afib     History of Present Illness: [name] is a 72 y.o. female who presents today for electrophysiology evaluation.   History of AF, obesity, HTN, CHF, CAD, HLD. Presented to the hospital 9/3 for tikosyn loading. Switched to sotalol as tikosyn not covered by [employer]. Cardioversion 10/03/15.    Today, denies symptoms of palpitations, chest pain, shortness of breath, orthopnea, PND, lower extremity edema, claudication, dizziness, presyncope, syncope, bleeding, or neurologic sequela. The patient is tolerating medications without difficulties.  She is overall feeling well without major complaint.   Past Medical History:  Diagnosis Date  . CAD (coronary artery disease)    a. LHC 3/17: mLAD 80, D1 50, pRCA 30, EF 25-35%; PCI: 3.5 x 16 mm Synergy DES to mLAD  . Chronic systolic CHF (congestive heart failure) ([place])    a. Echo 12/16: Mild concentric LVH, EF 25-30%, anteroseptal akinesis, inferoseptal akinesis, anterior akinesis, trivial AI, mild MR, moderate LAE, trivial TR, trivial PI, PASP 33 mmHg  //  b. Echo 2/17: Mild LVH, EF 25-30%, diffuse HK, mild LAE  //  c. Echo 7/17: mild LVH, EF 50-55%, no RWMA, trivial AI, mild to mod LAE, mild RAE  . Febrile seizure ([place]) 2004   "during TTP thing; cause my temp was 106"  . High cholesterol   . History of blood transfusion 2004   "several; related to TTP"  . Hypertension   . Hypothyroidism   . Ischemic cardiomyopathy   . Morbid obesity ([place]) 01/07/2013  . Osteoarthritis of back   . Paroxysmal atrial fibrillation ([place]) 08/01/2011   a. started on Sotalol during 10/2015 admission (Tikosyn too expensive)  . Pneumonia ?1999  . Scoliosis   .  Thyroid nodule    "grew back after 2 partial thyroid surgeries" (09/29/2015)  . 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 ABDOMINAL EXPLORATION SURGERY     "opened me up 2-3 times for blocked intestines"  . APPENDECTOMY    . BACK SURGERY    . BASAL CELL CARCINOMA EXCISION Right    "neck"  . CARDIAC CATHETERIZATION N/A 04/17/2015   Procedure: Left Heart Cath and Coronary Angiography;  Surgeon: Jettie Booze, MD;  Location: [facility];  Service: Cardiovascular;  Laterality: N/A;  . CARDIAC CATHETERIZATION  04/17/2015   Procedure: Coronary Stent Intervention;  Surgeon: Jettie Booze, MD;  Location: [facility] [facility];  Service: Cardiovascular;;  . CARDIOVERSION  06/2002; 03/2005; 05/2005   Archie Endo 06/16/2010  . CARDIOVERSION N/A 10/03/2015   Procedure: CARDIOVERSION;  Surgeon: Deboraha Sprang, MD;  Location: [facility];  Service: Cardiovascular;  Laterality: N/A;  . CATARACT EXTRACTION W/ INTRAOCULAR LENS  IMPLANT, BILATERAL Bilateral   . CHOLECYSTECTOMY OPEN    . CORONARY ANGIOPLASTY    . DILATION AND CURETTAGE OF UTERUS     S/P "miscarriage"  . FOOT SURGERY Right    "for nerve damage"  . SALIVARY STONE REMOVAL Left    "had tumor wrapped around it"  . SPLENECTOMY, TOTAL     Archie Endo 06/16/2010  . TEE WITHOUT CARDIOVERSION N/A 09/30/2015   Procedure: TRANSESOPHAGEAL ECHOCARDIOGRAM (TEE);  Surgeon: Jonelle Sidle  Oval Linsey, MD;  Location: [facility];  Service: Cardiovascular;  Laterality: N/A;  . THORACIC DISCECTOMY  05/2002  . THYROIDECTOMY, PARTIAL  X 2   "grew back"  . TONSILLECTOMY    . TUBAL LIGATION    . TUMOR EXCISION     "benign tumor in my neck"  . VAGINAL HYSTERECTOMY       Current Outpatient Medications  Medication Sig Dispense Refill  . benazepril (LOTENSIN) 10 MG tablet TAKE 1 TABLET BY MOUTH EVERY DAY 90 tablet 1  . Cholecalciferol (VITAMIN D3) 2000 units capsule Take 2,000 Units by mouth every  morning.    . clopidogrel (PLAVIX) 75 MG tablet Take 1 tablet (75 mg total) by mouth daily with breakfast. Please make yearly appt with Dr. Curt Bears for April. 1st attempt 90 tablet 0  . furosemide (LASIX) 20 MG tablet Take 1 tablet (20 mg total) by mouth daily. Please make yearly appt with Dr. Curt Bears for April before anymore refills. 1st attempt 30 tablet 0  . isosorbide mononitrate (IMDUR) 30 MG 24 hr tablet TAKE 1 TABLET BY MOUTH EVERY DAY 90 tablet 0  . levothyroxine (SYNTHROID, LEVOTHROID) 50 MCG tablet Take 50 mcg by mouth daily before breakfast.    . metoprolol succinate (TOPROL-XL) 100 MG 24 hr tablet TAKE 1 TABLET BY MOUTH EVERY MORNING PLUS 1/2 TABLET EVERY EVENING 180 tablet 2  . nitroGLYCERIN (NITROSTAT) 0.4 MG SL tablet Place 1 tablet (0.4 mg total) under the tongue every 5 (five) minutes as needed for chest pain. 25 tablet 3  . sotalol (BETAPACE) 120 MG tablet TAKE 1 TABLET BY MOUTH TWICE A DAY 60 tablet 10  . warfarin (COUMADIN) 5 MG tablet TAKE AS DIRECTED BY COUMADIN CLINIC 45 tablet 3   No current facility-administered medications for this visit.     Allergies:   Ceftriaxone; Levofloxacin; Levofloxacin; Oxycodone; Tape; Alendronate; Aspirin; Oxycodone-acetaminophen; Oxycodone-acetaminophen; Penicillin g; and Penicillins   Social History:  The patient  reports that she has never smoked. She has never used smokeless tobacco. She reports that she does not drink alcohol or use drugs.   Family History:  The patient's family history includes Cancer in her brother and sister; Heart attack in her mother.    ROS:  Please see the history of present illness.   Otherwise, review of systems is positive for none.   All other systems are reviewed and negative.   PHYSICAL EXAM: VS:  BP 140/90   Pulse (!) 55   Ht 5\' 4"  (1.626 m)   Wt 280 lb (127 kg)   SpO2 94%   BMI 48.06 kg/m  , BMI Body mass index is 48.06 kg/m. GEN: Well nourished, well developed, in no acute distress  HEENT:  normal  Neck: no JVD, carotid bruits, or masses Cardiac: RRR; no murmurs, rubs, or gallops,no edema  Respiratory:  clear to auscultation bilaterally, normal work of breathing GI: soft, nontender, nondistended, + BS MS: no deformity or atrophy  Skin: warm and dry Neuro:  Strength and sensation are intact Psych: euthymic mood, full affect  EKG:  EKG is ordered today. Personal review of the ekg ordered shows SR, RAD, QTc 419   Recent Labs: 09/22/2016: B Natriuretic Peptide 107.2 09/23/2016: TSH 3.711 09/24/2016: BUN 14; Creatinine, Ser 0.89; Hemoglobin 13.0; Platelets 471; Potassium 3.8; Sodium 139    Lipid Panel     Component Value Date/Time   CHOL 178 09/23/2016 0912   TRIG 90 09/23/2016 0912   HDL 37 (L) 09/23/2016 0912   CHOLHDL  4.8 09/23/2016 0912   VLDL 18 09/23/2016 0912   LDLCALC 123 (H) 09/23/2016 0912     Wt Readings from Last 3 Encounters:  05/01/17 280 lb (127 kg)  09/24/16 277 lb 11.2 oz (126 kg)  05/25/16 280 lb 3.2 oz (127.1 kg)      Other studies Reviewed: Additional studies/ records that were reviewed today include: TEE 09/30/15  Review of the above records today demonstrates:  - Left ventricle: Systolic function was normal. The estimated   ejection fraction was in the range of 50% to 55%. Wall motion was   normal; there were no regional wall motion abnormalities. - Aortic valve: There was mild regurgitation. - Mitral valve: There was mild regurgitation, with multiple jets   directed centrally. - Left atrium: No evidence of thrombus in the atrial cavity or   appendage. No evidence of thrombus in the atrial cavity or   appendage. - Right atrium: No evidence of thrombus in the atrial cavity or   appendage. - Atrial septum: No defect or patent foramen ovale was identified   by color flow Doppler or saline microcavitation study.   ASSESSMENT AND PLAN:  1.  Atrial fibrillation: Currently on sotalol and Coumadin.  Has been feeling well on her sotalol  without much in the way of more atrial fibrillation.  No changes.  This patients CHA2DS2-VASc Score and unadjusted Ischemic Stroke Rate (% per year) is equal to 4.8 % stroke rate/year from a score of 4  Above score calculated as 1 point each if present [CHF, HTN, DM, Vascular=MI/PAD/Aortic Plaque, Age if 65-74, or Female] Above score calculated as 2 points each if present [Age > 75, or Stroke/TIA/TE]   2. Morbid obesity: Has been trying to lose weight, though she has been eating more since her husband died.  She is working to correct this.  3. CAD: Currently asymptomatic.  Continue current management.  4. Hypertension: Well-controlled today.   Current medicines are reviewed at length with the patient today.   The patient does not have concerns regarding her medicines.  The following changes were made today:  none  Labs/ tests ordered today include:  Orders Placed This Encounter  Procedures  . Magnesium  . Basic Metabolic Panel (BMET)  . Basic Metabolic Panel (BMET)  . CBC w/Diff  . EKG 12-Lead     Disposition:   FU with Kurk Corniel 12 months  Signed, Felise Georgia Meredith Leeds, MD  05/01/2017 10:15 AM     [facility] [address] [place] [address] [phone] (office) [phone] (fax)

## 2017-05-01 NOTE — Patient Instructions (Addendum)
Medication Instructions:  Your physician recommends that you continue on your current medications as directed. Please refer to the Current Medication l  Labwork: Today: Magnesium level and BMP  Your physician recommends that you return for lab work in: 6 months for Magnesium level and BMP.   Testing/Procedures: None ordered  Follow-Up: Your physician recommends that you schedule a follow-up appointment in: 6 months for a nurse visit EKG.  Your physician wants you to follow-up in: 1 year with Dr. Curt Bears.  You will receive a reminder letter in the mail two months in advance. If you don't receive a letter, please call our office to schedule the follow-up appointment.  * If you need a refill on your cardiac medications before your next appointment, please call your pharmacy.   *Please note that any paperwork needing to be filled out by the provider will need to be addressed at the front desk prior to seeing the provider. Please note that any FMLA, disability or other documents regarding health condition is subject to a $25.00 charge that must be received prior to completion of paperwork in the form of a money order or check.  Thank you for choosing [facility]!!   Trinidad Curet, RN [phone]

## 2017-05-02 ENCOUNTER — Other Ambulatory Visit: Payer: Self-pay | Admitting: Cardiology

## 2017-05-19 ENCOUNTER — Other Ambulatory Visit: Payer: Self-pay | Admitting: Cardiology

## 2017-05-31 ENCOUNTER — Ambulatory Visit (INDEPENDENT_AMBULATORY_CARE_PROVIDER_SITE_OTHER): Payer: Medicare Other | Admitting: *Deleted

## 2017-05-31 DIAGNOSIS — Z5181 Encounter for therapeutic drug level monitoring: Secondary | ICD-10-CM | POA: Diagnosis not present

## 2017-05-31 DIAGNOSIS — I4819 Other persistent atrial fibrillation: Secondary | ICD-10-CM

## 2017-05-31 DIAGNOSIS — I481 Persistent atrial fibrillation: Secondary | ICD-10-CM

## 2017-05-31 LAB — POCT INR: INR: 2.1

## 2017-05-31 NOTE — Patient Instructions (Signed)
Description   Continue same dose Coumadin 5mg (1 tablet) daily except 7.5 mg (1.5 tablets) on Sundays, Wednesdays and Fridays. Recheck INR 4 weeks. Call us with any new medications or concerns #[phone].     

## 2017-06-26 ENCOUNTER — Other Ambulatory Visit: Payer: Self-pay | Admitting: Cardiology

## 2017-06-27 ENCOUNTER — Ambulatory Visit (INDEPENDENT_AMBULATORY_CARE_PROVIDER_SITE_OTHER): Payer: Medicare Other | Admitting: *Deleted

## 2017-06-27 DIAGNOSIS — Z5181 Encounter for therapeutic drug level monitoring: Secondary | ICD-10-CM

## 2017-06-27 DIAGNOSIS — I4819 Other persistent atrial fibrillation: Secondary | ICD-10-CM

## 2017-06-27 DIAGNOSIS — I481 Persistent atrial fibrillation: Secondary | ICD-10-CM | POA: Diagnosis not present

## 2017-06-27 LAB — POCT INR: INR: 2.5 (ref 2.0–3.0)

## 2017-06-27 NOTE — Patient Instructions (Signed)
Description   Continue same dose Coumadin 5mg (1 tablet) daily except 7.5 mg (1.5 tablets) on Sundays, Wednesdays and Fridays. Recheck INR 6 weeks. Call us with any new medications or concerns #[phone].     

## 2017-07-04 ENCOUNTER — Other Ambulatory Visit: Payer: Self-pay | Admitting: Cardiology

## 2017-07-05 ENCOUNTER — Encounter (HOSPITAL_COMMUNITY): Payer: Self-pay | Admitting: *Deleted

## 2017-07-05 ENCOUNTER — Other Ambulatory Visit: Payer: Self-pay

## 2017-07-05 ENCOUNTER — Emergency Department (HOSPITAL_COMMUNITY): Payer: Medicare Other

## 2017-07-05 ENCOUNTER — Inpatient Hospital Stay (HOSPITAL_COMMUNITY)
Admission: EM | Admit: 2017-07-05 | Discharge: 2017-07-09 | DRG: 193 | Disposition: A | Discharge status: Home Health | Payer: Medicare Other | Attending: Oncology | Admitting: Oncology

## 2017-07-05 DIAGNOSIS — M479 Spondylosis, unspecified: Secondary | ICD-10-CM | POA: Diagnosis not present

## 2017-07-05 DIAGNOSIS — R55 Syncope and collapse: Secondary | ICD-10-CM | POA: Diagnosis present

## 2017-07-05 DIAGNOSIS — I480 Paroxysmal atrial fibrillation: Secondary | ICD-10-CM | POA: Diagnosis not present

## 2017-07-05 DIAGNOSIS — M419 Scoliosis, unspecified: Secondary | ICD-10-CM | POA: Diagnosis not present

## 2017-07-05 DIAGNOSIS — Z8701 Personal history of pneumonia (recurrent): Secondary | ICD-10-CM

## 2017-07-05 DIAGNOSIS — Z7901 Long term (current) use of anticoagulants: Secondary | ICD-10-CM

## 2017-07-05 DIAGNOSIS — Z8249 Family history of ischemic heart disease and other diseases of the circulatory system: Secondary | ICD-10-CM | POA: Diagnosis not present

## 2017-07-05 DIAGNOSIS — D473 Essential (hemorrhagic) thrombocythemia: Secondary | ICD-10-CM | POA: Diagnosis not present

## 2017-07-05 DIAGNOSIS — Z79890 Hormone replacement therapy (postmenopausal): Secondary | ICD-10-CM

## 2017-07-05 DIAGNOSIS — Z85828 Personal history of other malignant neoplasm of skin: Secondary | ICD-10-CM | POA: Diagnosis not present

## 2017-07-05 DIAGNOSIS — Z888 Allergy status to other drugs, medicaments and biological substances status: Secondary | ICD-10-CM

## 2017-07-05 DIAGNOSIS — J40 Bronchitis, not specified as acute or chronic: Secondary | ICD-10-CM | POA: Diagnosis present

## 2017-07-05 DIAGNOSIS — Z880 Allergy status to penicillin: Secondary | ICD-10-CM

## 2017-07-05 DIAGNOSIS — I5042 Chronic combined systolic (congestive) and diastolic (congestive) heart failure: Secondary | ICD-10-CM | POA: Diagnosis present

## 2017-07-05 DIAGNOSIS — E039 Hypothyroidism, unspecified: Secondary | ICD-10-CM | POA: Diagnosis not present

## 2017-07-05 DIAGNOSIS — M311 Thrombotic microangiopathy: Secondary | ICD-10-CM | POA: Diagnosis present

## 2017-07-05 DIAGNOSIS — J157 Pneumonia due to Mycoplasma pneumoniae: Secondary | ICD-10-CM | POA: Diagnosis not present

## 2017-07-05 DIAGNOSIS — I481 Persistent atrial fibrillation: Secondary | ICD-10-CM | POA: Diagnosis present

## 2017-07-05 DIAGNOSIS — Z79899 Other long term (current) drug therapy: Secondary | ICD-10-CM

## 2017-07-05 DIAGNOSIS — I2510 Atherosclerotic heart disease of native coronary artery without angina pectoris: Secondary | ICD-10-CM | POA: Diagnosis not present

## 2017-07-05 DIAGNOSIS — I10 Essential (primary) hypertension: Secondary | ICD-10-CM | POA: Diagnosis present

## 2017-07-05 DIAGNOSIS — E7800 Pure hypercholesterolemia, unspecified: Secondary | ICD-10-CM | POA: Diagnosis present

## 2017-07-05 DIAGNOSIS — R05 Cough: Secondary | ICD-10-CM | POA: Diagnosis present

## 2017-07-05 DIAGNOSIS — R42 Dizziness and giddiness: Secondary | ICD-10-CM

## 2017-07-05 DIAGNOSIS — R059 Cough, unspecified: Secondary | ICD-10-CM

## 2017-07-05 DIAGNOSIS — Z885 Allergy status to narcotic agent status: Secondary | ICD-10-CM

## 2017-07-05 DIAGNOSIS — M3119 Other thrombotic microangiopathy: Secondary | ICD-10-CM | POA: Diagnosis present

## 2017-07-05 DIAGNOSIS — I110 Hypertensive heart disease with heart failure: Secondary | ICD-10-CM | POA: Diagnosis not present

## 2017-07-05 DIAGNOSIS — Z862 Personal history of diseases of the blood and blood-forming organs and certain disorders involving the immune mechanism: Secondary | ICD-10-CM

## 2017-07-05 DIAGNOSIS — E041 Nontoxic single thyroid nodule: Secondary | ICD-10-CM | POA: Diagnosis present

## 2017-07-05 DIAGNOSIS — Z955 Presence of coronary angioplasty implant and graft: Secondary | ICD-10-CM | POA: Diagnosis not present

## 2017-07-05 DIAGNOSIS — Z881 Allergy status to other antibiotic agents status: Secondary | ICD-10-CM

## 2017-07-05 DIAGNOSIS — D72829 Elevated white blood cell count, unspecified: Secondary | ICD-10-CM | POA: Diagnosis not present

## 2017-07-05 DIAGNOSIS — Z9081 Acquired absence of spleen: Secondary | ICD-10-CM | POA: Diagnosis not present

## 2017-07-05 DIAGNOSIS — Z9109 Other allergy status, other than to drugs and biological substances: Secondary | ICD-10-CM

## 2017-07-05 DIAGNOSIS — Z886 Allergy status to analgesic agent status: Secondary | ICD-10-CM

## 2017-07-05 DIAGNOSIS — I255 Ischemic cardiomyopathy: Secondary | ICD-10-CM | POA: Diagnosis not present

## 2017-07-05 DIAGNOSIS — Z6841 Body Mass Index (BMI) 40.0 and over, adult: Secondary | ICD-10-CM

## 2017-07-05 DIAGNOSIS — I4819 Other persistent atrial fibrillation: Secondary | ICD-10-CM | POA: Diagnosis present

## 2017-07-05 DIAGNOSIS — Z7902 Long term (current) use of antithrombotics/antiplatelets: Secondary | ICD-10-CM

## 2017-07-05 LAB — CBC
HCT: 46 % (ref 36.0–46.0)
HEMOGLOBIN: 14.7 g/dL (ref 12.0–15.0)
MCH: 29.2 pg (ref 26.0–34.0)
MCHC: 32 g/dL (ref 30.0–36.0)
MCV: 91.5 fL (ref 78.0–100.0)
Platelets: 495 10*3/uL — ABNORMAL HIGH (ref 150–400)
RBC: 5.03 MIL/uL (ref 3.87–5.11)
RDW: 15.2 % (ref 11.5–15.5)
WBC: 10.8 10*3/uL — ABNORMAL HIGH (ref 4.0–10.5)

## 2017-07-05 LAB — HEPATIC FUNCTION PANEL
ALK PHOS: 50 U/L (ref 38–126)
ALT: 29 U/L (ref 14–54)
AST: 35 U/L (ref 15–41)
Albumin: 3 g/dL — ABNORMAL LOW (ref 3.5–5.0)
BILIRUBIN DIRECT: 0.3 mg/dL (ref 0.1–0.5)
BILIRUBIN TOTAL: 1.1 mg/dL (ref 0.3–1.2)
Indirect Bilirubin: 0.8 mg/dL (ref 0.3–0.9)
Total Protein: 7 g/dL (ref 6.5–8.1)

## 2017-07-05 LAB — AMMONIA: Ammonia: 27 umol/L (ref 9–35)

## 2017-07-05 LAB — I-STAT ARTERIAL BLOOD GAS, ED
ACID-BASE DEFICIT: 2 mmol/L (ref 0.0–2.0)
BICARBONATE: 22.4 mmol/L (ref 20.0–28.0)
O2 Saturation: 94 %
TCO2: 24 mmol/L (ref 22–32)
pCO2 arterial: 36.9 mmHg (ref 32.0–48.0)
pH, Arterial: 7.392 (ref 7.350–7.450)
pO2, Arterial: 69 mmHg — ABNORMAL LOW (ref 83.0–108.0)

## 2017-07-05 LAB — BASIC METABOLIC PANEL
ANION GAP: 9 (ref 5–15)
BUN: 10 mg/dL (ref 6–20)
CALCIUM: 8.2 mg/dL — AB (ref 8.9–10.3)
CO2: 25 mmol/L (ref 22–32)
Chloride: 100 mmol/L — ABNORMAL LOW (ref 101–111)
Creatinine, Ser: 1.1 mg/dL — ABNORMAL HIGH (ref 0.44–1.00)
GFR, EST AFRICAN AMERICAN: 57 mL/min — AB (ref >60)
GFR, EST NON AFRICAN AMERICAN: 49 mL/min — AB (ref >60)
Glucose, Bld: 181 mg/dL — ABNORMAL HIGH (ref 65–99)
Potassium: 3.8 mmol/L (ref 3.5–5.1)
Sodium: 134 mmol/L — ABNORMAL LOW (ref 135–145)

## 2017-07-05 LAB — URINALYSIS, ROUTINE W REFLEX MICROSCOPIC
Bilirubin Urine: NEGATIVE
GLUCOSE, UA: NEGATIVE mg/dL
HGB URINE DIPSTICK: NEGATIVE
Ketones, ur: 5 mg/dL — AB
NITRITE: NEGATIVE
Protein, ur: NEGATIVE mg/dL
SPECIFIC GRAVITY, URINE: 1.013 (ref 1.005–1.030)
pH: 5 (ref 5.0–8.0)

## 2017-07-05 LAB — TSH: TSH: 3.239 u[IU]/mL (ref 0.350–4.500)

## 2017-07-05 LAB — I-STAT TROPONIN, ED: TROPONIN I, POC: 0.01 ng/mL (ref 0.00–0.08)

## 2017-07-05 LAB — PROTIME-INR
INR: 2.07
Prothrombin Time: 23.1 seconds — ABNORMAL HIGH (ref 11.4–15.2)

## 2017-07-05 LAB — RAPID URINE DRUG SCREEN, HOSP PERFORMED
AMPHETAMINES: NOT DETECTED
Barbiturates: NOT DETECTED
Benzodiazepines: NOT DETECTED
COCAINE: NOT DETECTED
OPIATES: NOT DETECTED
Tetrahydrocannabinol: NOT DETECTED

## 2017-07-05 LAB — ETHANOL: Alcohol, Ethyl (B): <10 mg/dL (ref <10)

## 2017-07-05 LAB — BRAIN NATRIURETIC PEPTIDE: B Natriuretic Peptide: 193.7 pg/mL — ABNORMAL HIGH (ref 0.0–100.0)

## 2017-07-05 LAB — I-STAT CG4 LACTIC ACID, ED: Lactic Acid, Venous: 0.92 mmol/L (ref 0.5–1.9)

## 2017-07-05 MED ORDER — SODIUM CHLORIDE 0.9 % IV SOLN
1.0000 g | Freq: Two times a day (BID) | INTRAVENOUS | Status: DC
  Administered 2017-07-05: 1 g via INTRAVENOUS
  Filled 2017-07-05 (×2): qty 1

## 2017-07-05 MED ORDER — SODIUM CHLORIDE 0.9 % IV SOLN
500.0000 mg | INTRAVENOUS | Status: DC
  Administered 2017-07-05: 500 mg via INTRAVENOUS
  Filled 2017-07-05: qty 500

## 2017-07-05 MED ORDER — ALBUTEROL SULFATE (2.5 MG/3ML) 0.083% IN NEBU
5.0000 mg | INHALATION_SOLUTION | Freq: Once | RESPIRATORY_TRACT | Status: AC
  Administered 2017-07-05: 5 mg via RESPIRATORY_TRACT
  Filled 2017-07-05: qty 6

## 2017-07-05 NOTE — Progress Notes (Signed)
Per MD Regenia Skeeter, NIV on Hold due to normal ABG values.

## 2017-07-05 NOTE — Progress Notes (Signed)
Pharmacy Antibiotic Note  [name] is a 72 y.o. female admitted on 07/05/2017 with pneumonia.  Pharmacy has been consulted for cefepime dosing.  Patient came to ED with SOB and productive cough with green sputum. She reports subjective fevers. In the ED, she is afebrile, lactic acid pending, and CrCl ~60 ml/min. She is allergic to multiple antibiotics. MD consulted with pharmacy, we will begin azithromycin and cefepime at this time.    Plan: Azithromycin 500 mg Q24h per MD Cefepime 1g Q12h  Follow-up on clinical progression, BCx, sputum culture, and LOT Monitor renal fxn   Weight: 279 lb 15.8 oz (127 kg)  Temp (24hrs), Avg:98.6 F (37 C), Min:98.1 F (36.7 C), Max:99.3 F (37.4 C)  Recent Labs  Lab 07/05/17 1901  WBC 10.8*  CREATININE 1.10*    Estimated Creatinine Clearance: 61.9 mL/min (A) (by C-G formula based on SCr of 1.1 mg/dL (H)).    Allergies  Allergen Reactions  . Ceftriaxone Rash    "TTP"/Was hospitalized and in a coma for 60 days, per patient  . Levofloxacin Other (See Comments)    Dropped blood platelets extremely low  . Levofloxacin     Other reaction(s): Thrombocytopenia (intolerance)  . Oxycodone Itching  . Tape Other (See Comments)    Prefers paper or cloth tape  . Alendronate Rash  . Aspirin Other (See Comments)    Patient stated she is unable to take due to blood thinner  . Oxycodone-Acetaminophen Itching  . Oxycodone-Acetaminophen Itching  . Penicillin G Rash  . Penicillins Rash and Other (See Comments)    Cannot tolerate any "-CILLINS" Has patient had a PCN reaction causing immediate rash, facial/tongue/throat swelling, SOB or lightheadedness with hypotension: Yes Has patient had a PCN reaction causing severe rash involving mucus membranes or skin necrosis: No Has patient had a PCN reaction that required hospitalization No Has patient had a PCN reaction occurring within the last 10 years: Yes If all of the above answers are "NO", then  may proceed with Cephalosporin use. Causes WELTS!!    Antimicrobials this admission: 6/5 azithromycin >>  6/5 cefepime >>   Dose adjustments this admission:   Microbiology results: 6/5 BCx:  6/5 Sputum:    Leroy Libman, PharmD Pharmacy Resident Phone: [phone]

## 2017-07-05 NOTE — ED Triage Notes (Signed)
Pt reports going to pcp today due to sob and cough. Sent here due to pneumonia. Pt feels sob and lightheaded. Has productive cough with green sputum and reports fever.

## 2017-07-05 NOTE — H&P (Signed)
Date: 07/06/2017               Patient Name:  [name] MRN: [id number]  DOB: [birth year] Age / Sex: 72 y.o., female   PCP: Elisabeth Cara, PA-C         Medical Service: Internal Medicine Teaching Service         Attending Physician: Dr. Annia Belt, MD    First Contact: Dr. Vickki Muff Pager: [phone]  Second Contact: Dr. Ledell Noss Pager: [phone]       After Hours (After 5p/  First Contact Pager: [phone]  weekends / holidays): Second Contact Pager: [phone]   Chief Complaint: Productive cough  History of Present Illness:  [name] is a 72 yo with PMH of atrial fibrillation on warfarin, CAD s/p DES placement in 2017, HFpEF (LVEF 55-60%, G2DD), HTN, hypothyroidism, and obesity who is presenting for evaluation of productive cough and pre-syncope. History obtained directly from the patient with assistance of son, who was bedside. Patient was in usual state of health until about 1 week ago when she started to experience a sore throat after a routine visit to her cardiologist. Patient states she was doing well, managing symptoms with lozenges, when she started to develop a productive cough with thick green sputum. This cough has been progressively worsening since it started and has been associated with decreased appetite, subjective fevers, chills, and shortness of breath. Patient has overall felt very unwell and hasn't been able to eat or drink anything as a result of her symptoms, especially in the 2 days leading up to admission. Patient denies headaches, abdominal pain, nausea/vomiting, changes in bowel/bladder habits, and chest pain. On the day of presentation the patient went to her PCP for evaluation. At that visit the patient had a chest X ray and was told she had pneumonia so she had to go to the ED for evaluation.   Upon arrival to the ED the patient was afebrile with T max of 99.46F, nontachycardic (50s-60s), intermittently hypertensive  150s-160s/60s, and saturating >94% on room air. CBC remarkable for WBC of 10.8. BMP with Na 134, and creatinine 1.1 (baseline 0.9). Liver function panel significant only for albumin of 3.0. Istat Troponin = 0.01 and BNP = 193.7. INR = 2.07. EKG showed normal sinus rhythm without acute signs of ischemia. Chest X ray, 1 view with cardiomegaly and vascular congestion in [facility] ED but had 2 view chest X ray at outside facility which showed bilateral airspace opacities with small areas of consolidation in the lingula suggesting bronchopneumonia.   While in the ED undergoing her workup patient had two episodes of pre-syncope while sitting in her chair waiting to be examined and when she tried to sit up in bed for lung exam. Patient states these episodes felt like she got the "wind knocked out of her" and then she felt "as if I was going to die" and saw "a bright light" that she kept reaching for. She didn't think she passed out put can't quit remember all the events afterwards. Patient denied palpitations, dizziness prior to the onset of symptoms. The ED provider witnessed one event when she sat up in bed for lung exam and noted her to be confused and pale afterwards. After these events ED provider ordered additional testing including ABG which showed pH 7.392, pCO2 37 and pO2 69. TSH within normal limits. Urinalysis with negative nitrites, rare bacteria, and moderate leukocyte esterase. Head CT was negative for acute intracranial  process but did show evidence of small, remote bilateral lacunar infarcts.   Meds:  Current Meds  Medication Sig  . furosemide (LASIX) 20 MG tablet Take 1 tablet (20 mg total) by mouth daily.  . isosorbide mononitrate (IMDUR) 30 MG 24 hr tablet TAKE 1 TABLET BY MOUTH EVERY DAY  . levothyroxine (SYNTHROID, LEVOTHROID) 50 MCG tablet Take 50 mcg by mouth daily before breakfast.  . LORazepam (ATIVAN) 0.5 MG tablet Take 0.5 mg by mouth every 8 (eight) hours.  . metoprolol succinate  (TOPROL-XL) 100 MG 24 hr tablet TAKE 1 TABLET BY MOUTH EVERY MORNING PLUS 1/2 TABLET EVERY EVENING  . sotalol (BETAPACE) 120 MG tablet TAKE 1 TABLET BY MOUTH TWICE A DAY  . warfarin (COUMADIN) 5 MG tablet TAKE AS DIRECTED BY COUMADIN CLINIC   Allergies: Allergies as of 07/05/2017 - Review Complete 07/05/2017  Allergen Reaction Noted  . Ceftriaxone Rash and Other (See Comments) 11/04/2013  . Penicillins Hives, Rash, and Other (See Comments) 09/30/2010  . Levofloxacin Other (See Comments) 03/09/2013  . Levofloxacin Other (See Comments) 09/30/2010  . Oxycodone Itching 08/06/2015  . Tape Other (See Comments) 09/29/2015  . Alendronate Rash 08/06/2015  . Aspirin Other (See Comments) 05/31/2012  . Oxycodone-acetaminophen Itching 08/06/2015  . Penicillin g Rash 08/06/2015   Past Medical History: Past Medical History:  Diagnosis Date  . CAD (coronary artery disease)    a. LHC 3/17: mLAD 80, D1 50, pRCA 30, EF 25-35%; PCI: 3.5 x 16 mm Synergy DES to mLAD  . Chronic systolic CHF (congestive heart failure) ([place])    a. Echo 12/16: Mild concentric LVH, EF 25-30%, anteroseptal akinesis, inferoseptal akinesis, anterior akinesis, trivial AI, mild MR, moderate LAE, trivial TR, trivial PI, PASP 33 mmHg  //  b. Echo 2/17: Mild LVH, EF 25-30%, diffuse HK, mild LAE  //  c. Echo 7/17: mild LVH, EF 50-55%, no RWMA, trivial AI, mild to mod LAE, mild RAE  . Febrile seizure ([place]) 2004   "during TTP thing; cause my temp was 106"  . High cholesterol   . History of blood transfusion 2004   "several; related to TTP"  . Hypertension   . Hypothyroidism   . Ischemic cardiomyopathy   . Morbid obesity ([place]) 01/07/2013  . Osteoarthritis of back   . Paroxysmal atrial fibrillation ([place]) 08/01/2011   a. started on Sotalol during 10/2015 admission (Tikosyn too expensive)  . Pneumonia ?1999  . Scoliosis   . Thyroid nodule    "grew back after 2 partial thyroid surgeries" (09/29/2015)  . 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 Heart attack Mother   . Cancer Sister   . Cancer Brother    Social History:  Lives alone, with son next door. Independent with all advanced ADL's. Never smoker. No alcohol or illicit drug use.  Review of Systems: A complete ROS was negative except as per HPI.  Physical Exam: Blood pressure (!) 142/72, pulse (!) 54, temperature 99.3 F (37.4 C), temperature source Rectal, resp. rate 17, weight 279 lb 15.8 oz (127 kg), SpO2 93 %.  Physical Exam  Constitutional: She appears well-developed and well-nourished. No distress.  HENT:  Mouth/Throat: Oropharynx is clear and moist. No oropharyngeal exudate.  Eyes: Pupils are equal, round, and reactive to light. Conjunctivae and EOM are normal.  Cardiovascular: Normal rate, regular rhythm and intact distal pulses. Exam reveals no friction rub.  No murmur heard. Respiratory:  Speaking  comfortably in full sentences. No crackles appreciated, but scattered intermittent wheezing present bilaterally.  GI: Soft. She exhibits no distension. There is no tenderness. There is no rebound.  Musculoskeletal: She exhibits no edema (on bilateral lower extremities) or tenderness (on bilateral lower extremities).  Lymphadenopathy:    She has no cervical adenopathy.  Skin: Skin is warm and dry. No rash noted. She is not diaphoretic. No erythema.   EKG: personally reviewed my interpretation is normal sinus rhythm without ST elevation or TWI to suggest ischemia.   CXR: personally reviewed my interpretation is mild cardiomegaly with mild vascular congestion. Increased interstitial markings throughout bilateral lungs without evidence of pleural fluid.   Assessment & Plan by Problem: Active Problems:   Bronchitis  Enrica Corliss is a 72 yo with PMH of atrial fibrillation on warfarin, CAD s/p DES placement in 2017, HFpEF (LVEF 55-60%, G2DD), HTN, hypothyroidism, and  obesity who is presenting for evaluation of productive cough and pre-syncope. Patient admitted to the internal medicine teaching service for management. The specific problems addressed during admission are as follows:  Bronchitis: Patient presenting with 1 week history of productive cough with green sputum and subjective fevers and chills. Overall clinical picture consistent with infectious bronchitis and/or early pneumonia. The patient has had two chest x rays completed on the day of presentation which show cardiomegaly and ?vascular congestion and/or increased bilateral airspace opacities concerning for pneumonia. Patient with obvious wheezing on EDP exam that responded well to albuterol nebulizer treatments in ED. Will continue patient on azithromycin for treatment of possible bronchopneumonia and provide breathing treatments as needed for intermittent wheezing. Will obtain RVP and procalcitonin to help with decision to continue antibiotic therapy as well.  -Admit to med-surg -Acetaminophen PRN for fever -Continue azithromycin 250 mg daily -Scheduled DuoNebs -F/u procalcitonin, RVP -F/u sputum culture collected in ED -Supportive care for cough  Presyncope: Patient had two episodes of presyncope while in ED. Workup for AMS largely unrevealing and overall reassuring thus far as ABG, Head CT, electrolytes, EKG, and urinalysis all within normal limits. Etiology of these episodes is unclear, as patient had no prodromal symptoms such as palpitations or dizziness to suggest cardiogenic syncope. Although patient reports recent decreased PO intake, she denied signs/symptoms of orthostasis at the time - and both events occurred while sitting. It's possible that patient had a reflex syncopal event in the setting of ongoing cough. Will continue to provide supportive care for her symptoms and monitor for signs/symptoms of orthostasis while inpatient.  -Continue to monitor  HFpEF (LVEF 55-60% with G2DD), CAD s/p  DES: Patient appears euvolemic on exam without any signs of peripheral or central edema, although lung exam was limited 2/2 patient's positioning after recent pre-syncope. Patient's CXR suggested vascular congestion, but she doesn't have clinical signs/symptoms of volume overload at the present. Suspect imaging changes related to underlying infectious process as discussed above rather than acute HF exacerbation.  -Continue home lasix 20 mg daily -Continue home clopidogrel 75 mg daily, Imdur 30 mg, sotalol 120 mg BID, and benazepril 10 mg daily  A-Fibrillation on warfarin: Normal sinus rhythm on exam. INR therapeutic on admission. -Warfarin per pharmacy consult  Hypertension: Currently intermittently hypertensive in ED. Syncope less likely to be related to orthostasis, as one of her episodes occurred while she was sitting in a chair and not moving. Will continue to monitor on home regimen as outlined above.  -Continue home Imdur 30 mg, sotalol 120 mg BID, and benazepril 10 mg daily  Hypothyroidism: Chronic, stable.  TSH within normal limits on admission.  -Continue home synthroid 50 mcg daily  FEN/GI: -Heart Healthy diet -No IVF, replace electrolytes as needed  VTE Prophylaxis: Lovenox daily Code Status: Full  Dispo: Admit patient to Observation with expected length of stay less than 2 midnights.  SignedThomasene Ripple, MD 07/06/2017, 12:31 AM  Pager: [phone]

## 2017-07-05 NOTE — ED Notes (Addendum)
Nurse will collect gold top for tsh blood test.

## 2017-07-05 NOTE — ED Notes (Signed)
Blood collected by NT Starla Link

## 2017-07-05 NOTE — Progress Notes (Signed)
ABG collected  

## 2017-07-05 NOTE — Progress Notes (Signed)
Expectorated sputum collected and sent to the LAB.

## 2017-07-05 NOTE — ED Provider Notes (Signed)
[facility] EMERGENCY DEPARTMENT Provider Note   CSN: [id number] Arrival date & time: 07/05/17  1640   LEVEL 5 CAVEAT - ALTERED MENTAL STATUS  History   Chief Complaint Chief Complaint  Patient presents with  . Cough  . Shortness of Breath    HPI [name] is a 72 y.o. female.  HPI  72 year old female with a history of CHF, coronary disease, atrial fibrillation, and TTP presents with cough and concern for pneumonia.  The history is extremely limited as when I am seeing the patient, she had just had a near syncopal episode while getting ready to go to chest x-ray.  When I am seeing the patient, she is pale and not very responsive to me.  She does not answer all questions very appropriately but does seem to indicate she is had a cough with productive sputum for about a week.  She has had fevers but cannot tell me how high.  The rest of the history is very limited.  Past Medical History:  Diagnosis Date  . CAD (coronary artery disease)    a. LHC 3/17: mLAD 80, D1 50, pRCA 30, EF 25-35%; PCI: 3.5 x 16 mm Synergy DES to mLAD  . Chronic systolic CHF (congestive heart failure) ([place])    a. Echo 12/16: Mild concentric LVH, EF 25-30%, anteroseptal akinesis, inferoseptal akinesis, anterior akinesis, trivial AI, mild MR, moderate LAE, trivial TR, trivial PI, PASP 33 mmHg  //  b. Echo 2/17: Mild LVH, EF 25-30%, diffuse HK, mild LAE  //  c. Echo 7/17: mild LVH, EF 50-55%, no RWMA, trivial AI, mild to mod LAE, mild RAE  . Febrile seizure ([place]) 2004   "during TTP thing; cause my temp was 106"  . High cholesterol   . History of blood transfusion 2004   "several; related to TTP"  . Hypertension   . Hypothyroidism   . Ischemic cardiomyopathy   . Morbid obesity ([place]) 01/07/2013  . Osteoarthritis of back   . Paroxysmal atrial fibrillation ([place]) 08/01/2011   a. started on Sotalol during 10/2015 admission (Tikosyn too expensive)  . Pneumonia ?1999  . Scoliosis   . Thyroid  nodule    "grew back after 2 partial thyroid surgeries" (09/29/2015)  . TTP (thrombotic thrombocytopenic purpura) ([place]) 05/03/2011    Patient Active Problem List   Diagnosis Date Noted  . CHF exacerbation ([place]) 09/23/2016  . Hyperglycemia 09/23/2016  . Hypothyroidism 09/23/2016  . Hyperlipidemia 08/06/2015  . Persistent atrial fibrillation ([place]) 04/18/2015  . CAD (coronary artery disease) 04/18/2015  . Cardiomyopathy, ischemic 04/18/2015  . Hypertensive heart disease with heart failure ([place]) 04/15/2015  . Anxiety 04/15/2015  . Chronic anticoagulation 04/15/2015  . Encounter for therapeutic drug monitoring 01/21/2015  . Dyspnea on exertion 01/05/2015  . Morbid obesity ([place]) 01/07/2013  . Benign essential HTN 08/01/2011  . TTP (thrombotic thrombocytopenic purpura) ([place]) 05/03/2011    Past Surgical History:  Procedure Laterality Date  . ABDOMINAL EXPLORATION SURGERY     "opened me up 2-3 times for blocked intestines"  . APPENDECTOMY    . BACK SURGERY    . BASAL CELL CARCINOMA EXCISION Right    "neck"  . CARDIAC CATHETERIZATION N/A 04/17/2015   Procedure: Left Heart Cath and Coronary Angiography;  Surgeon: Jettie Booze, MD;  Location: [facility];  Service: Cardiovascular;  Laterality: N/A;  . CARDIAC CATHETERIZATION  04/17/2015   Procedure: Coronary Stent Intervention;  Surgeon: Jettie Booze, MD;  Location: [facility];  Service: Cardiovascular;;  . CARDIOVERSION  06/2002; 03/2005; 05/2005   Archie Endo 06/16/2010  . CARDIOVERSION N/A 10/03/2015   Procedure: CARDIOVERSION;  Surgeon: Deboraha Sprang, MD;  Location: [facility];  Service: Cardiovascular;  Laterality: N/A;  . CATARACT EXTRACTION W/ INTRAOCULAR LENS  IMPLANT, BILATERAL Bilateral   . CHOLECYSTECTOMY OPEN    . CORONARY ANGIOPLASTY    . DILATION AND CURETTAGE OF UTERUS     S/P "miscarriage"  . FOOT SURGERY Right    "for nerve damage"  . SALIVARY STONE REMOVAL Left    "had tumor wrapped around it"  .  SPLENECTOMY, TOTAL     Archie Endo 06/16/2010  . TEE WITHOUT CARDIOVERSION N/A 09/30/2015   Procedure: TRANSESOPHAGEAL ECHOCARDIOGRAM (TEE);  Surgeon: Skeet Latch, MD;  Location: [facility];  Service: Cardiovascular;  Laterality: N/A;  . THORACIC DISCECTOMY  05/2002  . THYROIDECTOMY, PARTIAL  X 2   "grew back"  . TONSILLECTOMY    . TUBAL LIGATION    . TUMOR EXCISION     "benign tumor in my neck"  . VAGINAL HYSTERECTOMY       OB History   None      Home Medications    Prior to Admission medications   Medication Sig Start Date End Date Taking? Authorizing Provider  benazepril (LOTENSIN) 10 MG tablet TAKE 1 TABLET BY MOUTH EVERY DAY 05/02/17   Jerline Pain, MD  Cholecalciferol (VITAMIN D3) 2000 units capsule Take 2,000 Units by mouth every morning.    [provider]  clopidogrel (PLAVIX) 75 MG tablet Take 1 tablet (75 mg total) by mouth daily with breakfast. Please make yearly appt with Dr. Curt Bears for April. 1st attempt 02/06/17   Jerline Pain, MD  furosemide (LASIX) 20 MG tablet Take 1 tablet (20 mg total) by mouth daily. 05/19/17   Jerline Pain, MD  isosorbide mononitrate (IMDUR) 30 MG 24 hr tablet TAKE 1 TABLET BY MOUTH EVERY DAY 07/04/17   Camnitz, Ocie Doyne, MD  levothyroxine (SYNTHROID, LEVOTHROID) 50 MCG tablet Take 50 mcg by mouth daily before breakfast.    [provider]  metoprolol succinate (TOPROL-XL) 100 MG 24 hr tablet TAKE 1 TABLET BY MOUTH EVERY MORNING PLUS 1/2 TABLET EVERY EVENING 09/29/16   Richardson Dopp T, PA-C  nitroGLYCERIN (NITROSTAT) 0.4 MG SL tablet Place 1 tablet (0.4 mg total) under the tongue every 5 (five) minutes as needed for chest pain. 01/08/15   Almyra Deforest, PA  sotalol (BETAPACE) 120 MG tablet TAKE 1 TABLET BY MOUTH TWICE A DAY 06/27/17   Camnitz, Ocie Doyne, MD  warfarin (COUMADIN) 5 MG tablet TAKE AS DIRECTED BY COUMADIN CLINIC 05/19/17   Jerline Pain, MD    Family History Family History  Problem Relation Age of Onset  .  Heart attack Mother   . Cancer Sister   . Cancer Brother     Social History Social History   Tobacco Use  . Smoking status: Never Smoker  . Smokeless tobacco: Never Used  Substance Use Topics  . Alcohol use: No  . Drug use: No     Allergies   Ceftriaxone; Penicillins; Levofloxacin; Levofloxacin; Oxycodone; Tape; Alendronate; Aspirin; Oxycodone-acetaminophen; and Penicillin g   Review of Systems Review of Systems  Unable to perform ROS: Mental status change     Physical Exam Updated Vital Signs BP (!) 148/62 (BP Location: Right Arm)   Pulse 61   Temp 99.3 F (37.4 C) (Rectal)   Resp 17   Wt 127  kg (279 lb 15.8 oz)   SpO2 97%   BMI 48.06 kg/m   Physical Exam  Constitutional: She is oriented to person, place, and time. She appears well-developed and well-nourished.  obese  HENT:  Head: Normocephalic and atraumatic.  Right Ear: External ear normal.  Left Ear: External ear normal.  Nose: Nose normal.  Eyes: Right eye exhibits no discharge. Left eye exhibits no discharge.  Cardiovascular: Normal rate, regular rhythm and normal heart sounds.  Pulmonary/Chest: Effort normal. No accessory muscle usage. Tachypnea noted. She has decreased breath sounds. She has wheezes.  Abdominal: Soft. There is no tenderness.  Neurological: She is alert and oriented to person, place, and time.  Skin: Skin is warm and dry. There is pallor.  Nursing note and vitals reviewed.    ED Treatments / Results  Labs (all labs ordered are listed, but only abnormal results are displayed) Labs Reviewed  BASIC METABOLIC PANEL - Abnormal; Notable for the following components:      Result Value   Sodium 134 (*)    Chloride 100 (*)    Glucose, Bld 181 (*)    Creatinine, Ser 1.10 (*)    Calcium 8.2 (*)    GFR calc non Af Amer 49 (*)    GFR calc Af Amer 57 (*)    All other components within normal limits  CBC - Abnormal; Notable for the following components:   WBC 10.8 (*)    Platelets 495  (*)    All other components within normal limits  HEPATIC FUNCTION PANEL - Abnormal; Notable for the following components:   Albumin 3.0 (*)    All other components within normal limits  BRAIN NATRIURETIC PEPTIDE - Abnormal; Notable for the following components:   B Natriuretic Peptide 193.7 (*)    All other components within normal limits  PROTIME-INR - Abnormal; Notable for the following components:   Prothrombin Time 23.1 (*)    All other components within normal limits  I-STAT ARTERIAL BLOOD GAS, ED - Abnormal; Notable for the following components:   pO2, Arterial 69.0 (*)    All other components within normal limits  CULTURE, BLOOD (ROUTINE X 2)  CULTURE, BLOOD (ROUTINE X 2)  CULTURE, RESPIRATORY (NON-EXPECTORATED)  AMMONIA  ETHANOL  TSH  URINALYSIS, ROUTINE W REFLEX MICROSCOPIC  RAPID URINE DRUG SCREEN, HOSP PERFORMED  I-STAT TROPONIN, ED  I-STAT CG4 LACTIC ACID, ED  I-STAT CG4 LACTIC ACID, ED    EKG EKG Interpretation  Date/Time:  Wednesday July 05 2017 19:21:27 EDT Ventricular Rate:  57 PR Interval:  154 QRS Duration: 94 QT Interval:  471 QTC Calculation: 459 R Axis:   93 Text Interpretation:  Sinus rhythm Right axis deviation no significant change compared to earlier in the day Confirmed by Sherwood Gambler [phone]) on 07/05/2017 7:51:54 PM   Radiology Ct Head Wo Contrast  Result Date: 07/05/2017 CLINICAL DATA:  Altered level of consciousness.  Lightheaded. EXAM: CT HEAD WITHOUT CONTRAST TECHNIQUE: Contiguous axial images were obtained from the base of the skull through the vertex without intravenous contrast. COMPARISON:  Head CT 10/18/2008 FINDINGS: Brain: Brain volume is normal for age. No intracranial hemorrhage, mass effect, or midline shift. Mild chronic small vessel ischemia. Tiny remote lacunar infarcts in the left caudate and right basal ganglia. No hydrocephalus. The basilar cisterns are patent. No evidence of territorial infarct or acute ischemia. No  extra-axial or intracranial fluid collection. Vascular: No hyperdense vessel or unexpected calcification. Skull: No fracture or focal lesion. Sinuses/Orbits: Small fluid  level in right side of sphenoid sinus. Remaining paranasal sinuses are clear. Bilateral cataract resection. Other: None. IMPRESSION: 1.  No acute intracranial abnormality. 2. Mild chronic small vessel ischemia with small remote lacunar infarcts in the right basal ganglia and left caudate. Electronically Signed   By: Jeb Levering M.D.   On: 07/05/2017 20:52   Dg Chest Port 1 View  Result Date: 07/05/2017 CLINICAL DATA:  Shortness of breath, cough EXAM: PORTABLE CHEST 1 VIEW COMPARISON:  07/05/2017 FINDINGS: Cardiomegaly with vascular congestion. No overt edema. No confluent opacities or effusions. No acute bony abnormality. IMPRESSION: Cardiomegaly, vascular congestion. Electronically Signed   By: Rolm Baptise M.D.   On: 07/05/2017 20:14    Procedures Procedures (including critical care time)  Medications Ordered in ED Medications  azithromycin (ZITHROMAX) 500 mg in sodium chloride 0.9 % 250 mL IVPB (0 mg Intravenous Stopped 07/05/17 2116)  ceFEPIme (MAXIPIME) 1 g in sodium chloride 0.9 % 100 mL IVPB (1 g Intravenous New Bag/Given 07/05/17 2118)  albuterol (PROVENTIL) (2.5 MG/3ML) 0.083% nebulizer solution 5 mg (5 mg Nebulization Given 07/05/17 2006)  albuterol (PROVENTIL) (2.5 MG/3ML) 0.083% nebulizer solution 5 mg (5 mg Nebulization Given 07/05/17 2155)     Initial Impression / Assessment and Plan / ED Course  I have reviewed the triage vital signs and the nursing notes.  Pertinent labs & imaging results that were available during my care of the patient were reviewed by me and considered in my medical decision making (see chart for details).     The patient initially is altered when I am seeing her.  She told me at different names and her true name.  She also kept telling me she felt like she was going to die.  When I sat her  up to listen to her lungs, she had another near syncopal episode.  After this, she was given albuterol treatment and work-up for altered mental status and her shortness of breath was started.  After an albuterol treatment her breath sounds are much more prominent with wheezing.  She does feel better.  She seems to be much closer to her baseline and her daughter is here.  I think this is likely COPD/bronchitis/possible early pneumonia.  She will be given antibiotics after consultation with pharmacy given her complex allergy history.  CT head obtained which is unremarkable and shows no acute obvious pathology for her altered mental state.  She does not appear to have respiratory acidosis.  Given her improvement, she appears stable for a floor admission to the internal medicine teaching service. Unclear why she had near-syncopal episodes. No events on cardiac monitoring when this happened.  Final Clinical Impressions(s) / ED Diagnoses   Final diagnoses:  Cough  Syncope    ED Discharge Orders    None       Sherwood Gambler, MD 07/05/17 2317

## 2017-07-06 DIAGNOSIS — R0902 Hypoxemia: Secondary | ICD-10-CM | POA: Diagnosis not present

## 2017-07-06 DIAGNOSIS — Z9081 Acquired absence of spleen: Secondary | ICD-10-CM | POA: Diagnosis not present

## 2017-07-06 DIAGNOSIS — Z886 Allergy status to analgesic agent status: Secondary | ICD-10-CM

## 2017-07-06 DIAGNOSIS — Z880 Allergy status to penicillin: Secondary | ICD-10-CM

## 2017-07-06 DIAGNOSIS — E669 Obesity, unspecified: Secondary | ICD-10-CM | POA: Diagnosis not present

## 2017-07-06 DIAGNOSIS — I5030 Unspecified diastolic (congestive) heart failure: Secondary | ICD-10-CM | POA: Diagnosis not present

## 2017-07-06 DIAGNOSIS — R55 Syncope and collapse: Secondary | ICD-10-CM

## 2017-07-06 DIAGNOSIS — I480 Paroxysmal atrial fibrillation: Secondary | ICD-10-CM | POA: Diagnosis not present

## 2017-07-06 DIAGNOSIS — D696 Thrombocytopenia, unspecified: Secondary | ICD-10-CM | POA: Diagnosis not present

## 2017-07-06 DIAGNOSIS — Z888 Allergy status to other drugs, medicaments and biological substances status: Secondary | ICD-10-CM

## 2017-07-06 DIAGNOSIS — D72829 Elevated white blood cell count, unspecified: Secondary | ICD-10-CM | POA: Diagnosis not present

## 2017-07-06 DIAGNOSIS — I2510 Atherosclerotic heart disease of native coronary artery without angina pectoris: Secondary | ICD-10-CM | POA: Diagnosis not present

## 2017-07-06 DIAGNOSIS — Z7901 Long term (current) use of anticoagulants: Secondary | ICD-10-CM

## 2017-07-06 DIAGNOSIS — Z862 Personal history of diseases of the blood and blood-forming organs and certain disorders involving the immune mechanism: Secondary | ICD-10-CM

## 2017-07-06 DIAGNOSIS — E039 Hypothyroidism, unspecified: Secondary | ICD-10-CM

## 2017-07-06 DIAGNOSIS — Z7902 Long term (current) use of antithrombotics/antiplatelets: Secondary | ICD-10-CM

## 2017-07-06 DIAGNOSIS — Z881 Allergy status to other antibiotic agents status: Secondary | ICD-10-CM

## 2017-07-06 DIAGNOSIS — J157 Pneumonia due to Mycoplasma pneumoniae: Secondary | ICD-10-CM | POA: Diagnosis not present

## 2017-07-06 DIAGNOSIS — Z955 Presence of coronary angioplasty implant and graft: Secondary | ICD-10-CM

## 2017-07-06 DIAGNOSIS — Z885 Allergy status to narcotic agent status: Secondary | ICD-10-CM

## 2017-07-06 DIAGNOSIS — R42 Dizziness and giddiness: Secondary | ICD-10-CM

## 2017-07-06 DIAGNOSIS — I110 Hypertensive heart disease with heart failure: Secondary | ICD-10-CM

## 2017-07-06 DIAGNOSIS — Z79899 Other long term (current) drug therapy: Secondary | ICD-10-CM

## 2017-07-06 DIAGNOSIS — Z91048 Other nonmedicinal substance allergy status: Secondary | ICD-10-CM

## 2017-07-06 DIAGNOSIS — Z79890 Hormone replacement therapy (postmenopausal): Secondary | ICD-10-CM

## 2017-07-06 LAB — CBC
HCT: 41.7 % (ref 36.0–46.0)
HEMOGLOBIN: 13.6 g/dL (ref 12.0–15.0)
MCH: 29.6 pg (ref 26.0–34.0)
MCHC: 32.6 g/dL (ref 30.0–36.0)
MCV: 90.7 fL (ref 78.0–100.0)
PLATELETS: 455 10*3/uL — AB (ref 150–400)
RBC: 4.6 MIL/uL (ref 3.87–5.11)
RDW: 15.1 % (ref 11.5–15.5)
WBC: 10.6 10*3/uL — ABNORMAL HIGH (ref 4.0–10.5)

## 2017-07-06 LAB — RESPIRATORY PANEL BY PCR
ADENOVIRUS-RVPPCR: NOT DETECTED
Bordetella pertussis: NOT DETECTED
CHLAMYDOPHILA PNEUMONIAE-RVPPCR: NOT DETECTED
CORONAVIRUS HKU1-RVPPCR: NOT DETECTED
CORONAVIRUS NL63-RVPPCR: NOT DETECTED
CORONAVIRUS OC43-RVPPCR: NOT DETECTED
Coronavirus 229E: NOT DETECTED
Influenza A: NOT DETECTED
Influenza B: NOT DETECTED
MYCOPLASMA PNEUMONIAE-RVPPCR: DETECTED — AB
Metapneumovirus: NOT DETECTED
PARAINFLUENZA VIRUS 1-RVPPCR: NOT DETECTED
PARAINFLUENZA VIRUS 4-RVPPCR: NOT DETECTED
Parainfluenza Virus 2: NOT DETECTED
Parainfluenza Virus 3: NOT DETECTED
Respiratory Syncytial Virus: NOT DETECTED
Rhinovirus / Enterovirus: NOT DETECTED

## 2017-07-06 LAB — BASIC METABOLIC PANEL
ANION GAP: 10 (ref 5–15)
BUN: 8 mg/dL (ref 6–20)
CALCIUM: 7.6 mg/dL — AB (ref 8.9–10.3)
CO2: 25 mmol/L (ref 22–32)
CREATININE: 0.92 mg/dL (ref 0.44–1.00)
Chloride: 103 mmol/L (ref 101–111)
Glucose, Bld: 113 mg/dL — ABNORMAL HIGH (ref 65–99)
Potassium: 3.4 mmol/L — ABNORMAL LOW (ref 3.5–5.1)
Sodium: 138 mmol/L (ref 135–145)

## 2017-07-06 LAB — PROCALCITONIN: Procalcitonin: <0.1 ng/mL

## 2017-07-06 LAB — PROTIME-INR
INR: 2.43
PROTHROMBIN TIME: 26.2 s — AB (ref 11.4–15.2)

## 2017-07-06 LAB — GLUCOSE, CAPILLARY: Glucose-Capillary: 168 mg/dL — ABNORMAL HIGH (ref 65–99)

## 2017-07-06 MED ORDER — SALINE SPRAY 0.65 % NA SOLN
2.0000 | Freq: Two times a day (BID) | NASAL | Status: DC
  Administered 2017-07-06 – 2017-07-08 (×6): 2 via NASAL
  Filled 2017-07-06: qty 44

## 2017-07-06 MED ORDER — ACETAMINOPHEN 650 MG RE SUPP: 650.0000 mg | Freq: Four times a day (QID) | RECTAL | Status: DC | PRN

## 2017-07-06 MED ORDER — WARFARIN SODIUM 5 MG PO TABS
5.0000 mg | ORAL_TABLET | ORAL | Status: DC
  Administered 2017-07-06 – 2017-07-08 (×2): 5 mg via ORAL
  Filled 2017-07-06: qty 1
  Filled 2017-07-06: qty 2
  Filled 2017-07-06: qty 1

## 2017-07-06 MED ORDER — IPRATROPIUM-ALBUTEROL 0.5-2.5 (3) MG/3ML IN SOLN: 3.0000 mL | Freq: Four times a day (QID) | RESPIRATORY_TRACT | Status: DC

## 2017-07-06 MED ORDER — METOPROLOL SUCCINATE ER 100 MG PO TB24: 100.0000 mg | ORAL_TABLET | Freq: Every day | ORAL | Status: DC

## 2017-07-06 MED ORDER — SOTALOL HCL 80 MG PO TABS
120.0000 mg | ORAL_TABLET | Freq: Two times a day (BID) | ORAL | Status: DC
  Administered 2017-07-06 – 2017-07-08 (×7): 120 mg via ORAL
  Filled 2017-07-06 (×7): qty 2
  Filled 2017-07-06: qty 1
  Filled 2017-07-06: qty 2

## 2017-07-06 MED ORDER — IPRATROPIUM-ALBUTEROL 0.5-2.5 (3) MG/3ML IN SOLN
3.0000 mL | Freq: Four times a day (QID) | RESPIRATORY_TRACT | Status: DC
  Administered 2017-07-06 – 2017-07-07 (×5): 3 mL via RESPIRATORY_TRACT
  Filled 2017-07-06 (×5): qty 3

## 2017-07-06 MED ORDER — SODIUM CHLORIDE 0.9% FLUSH
3.0000 mL | Freq: Two times a day (BID) | INTRAVENOUS | Status: DC
Start: 2017-07-06 — End: 2017-07-09
  Administered 2017-07-06 – 2017-07-08 (×7): 3 mL via INTRAVENOUS

## 2017-07-06 MED ORDER — WARFARIN - PHARMACIST DOSING INPATIENT: Freq: Every day | Status: DC

## 2017-07-06 MED ORDER — WARFARIN SODIUM 7.5 MG PO TABS
7.5000 mg | ORAL_TABLET | ORAL | Status: DC
Start: 2017-07-07 — End: 2017-07-09
  Administered 2017-07-07: 7.5 mg via ORAL
  Filled 2017-07-06: qty 1

## 2017-07-06 MED ORDER — FUROSEMIDE 20 MG PO TABS
20.0000 mg | ORAL_TABLET | Freq: Every day | ORAL | Status: DC
  Administered 2017-07-06 – 2017-07-08 (×3): 20 mg via ORAL
  Filled 2017-07-06 (×4): qty 1

## 2017-07-06 MED ORDER — BENAZEPRIL HCL 10 MG PO TABS
10.0000 mg | ORAL_TABLET | Freq: Every day | ORAL | Status: DC
  Administered 2017-07-06 – 2017-07-08 (×3): 10 mg via ORAL
  Filled 2017-07-06 (×4): qty 1

## 2017-07-06 MED ORDER — ACETAMINOPHEN 325 MG PO TABS: 650.0000 mg | ORAL_TABLET | Freq: Four times a day (QID) | ORAL | Status: DC | PRN

## 2017-07-06 MED ORDER — CLOPIDOGREL BISULFATE 75 MG PO TABS
75.0000 mg | ORAL_TABLET | Freq: Every day | ORAL | Status: DC
  Administered 2017-07-06 – 2017-07-07 (×2): 75 mg via ORAL
  Filled 2017-07-06 (×2): qty 1

## 2017-07-06 MED ORDER — GUAIFENESIN-DM 100-10 MG/5ML PO SYRP
5.0000 mL | ORAL_SOLUTION | ORAL | Status: DC | PRN
  Administered 2017-07-06 – 2017-07-07 (×2): 5 mL via ORAL
  Filled 2017-07-06 (×2): qty 5

## 2017-07-06 MED ORDER — AZITHROMYCIN 250 MG PO TABS
250.0000 mg | ORAL_TABLET | Freq: Every day | ORAL | Status: DC
  Administered 2017-07-06 – 2017-07-07 (×2): 250 mg via ORAL
  Filled 2017-07-06 (×2): qty 1

## 2017-07-06 MED ORDER — POTASSIUM CHLORIDE CRYS ER 20 MEQ PO TBCR
40.0000 meq | EXTENDED_RELEASE_TABLET | Freq: Once | ORAL | Status: AC
  Administered 2017-07-06: 40 meq via ORAL
  Filled 2017-07-06: qty 2

## 2017-07-06 MED ORDER — LEVOTHYROXINE SODIUM 50 MCG PO TABS
50.0000 ug | ORAL_TABLET | Freq: Every day | ORAL | Status: DC
  Administered 2017-07-06 – 2017-07-08 (×3): 50 ug via ORAL
  Filled 2017-07-06 (×5): qty 1

## 2017-07-06 MED ORDER — ISOSORBIDE MONONITRATE ER 30 MG PO TB24
30.0000 mg | ORAL_TABLET | Freq: Every day | ORAL | Status: DC
  Administered 2017-07-06 – 2017-07-08 (×3): 30 mg via ORAL
  Filled 2017-07-06 (×4): qty 1

## 2017-07-06 MED ORDER — ALBUTEROL SULFATE (2.5 MG/3ML) 0.083% IN NEBU: 2.5000 mg | INHALATION_SOLUTION | RESPIRATORY_TRACT | Status: DC | PRN

## 2017-07-06 NOTE — Progress Notes (Signed)
Called respiratory therapy about giving breathing treatment.

## 2017-07-06 NOTE — Progress Notes (Signed)
Subjective: [name] continues to complain of cough. Her breathing feels easier since her last breathing treatment. She has some appetite, but diminished compared with baseline. She continues to complain of general fatigue and malaise. She reports 2 days of subjective fevers preceding hospitalization. Her presyncopal episodes are not temporally related to coughing.  Objective:  Vital signs in last 24 hours: Vitals:   07/06/17 0110 07/06/17 0456 07/06/17 0936 07/06/17 1123  BP: (!) 152/64 (!) 157/74 (!) 157/54 (!) 158/62  Pulse: (!) 51 (!) 57 64 64  Resp:    19  Temp: 98.1 F (36.7 C) 98 F (36.7 C)  98.4 F (36.9 C)  TempSrc: Oral Oral  Oral  SpO2: 91% 91% 93% 92%  Weight: 125.8 kg (277 lb 4.8 oz)     Height: 5\' 4"  (1.626 m)      Weight change:   Intake/Output Summary (Last 24 hours) at 07/06/2017 1348 Last data filed at 07/06/2017 1334 Gross per 24 hour  Intake 363 ml  Output 1150 ml  Net -787 ml   General appearance: alert, cooperative, appears stated age, no distress and morbidly obese Head: Normocephalic, without obvious abnormality, atraumatic Throat: lips, mucosa, and tongue normal; teeth and gums normal Neck: no adenopathy, no carotid bruit, no JVD, supple, symmetrical, trachea midline and thyroid not enlarged, symmetric, no tenderness/mass/nodules Lungs: wheezes throughout, no crackles Heart: regular rate and rhythm, S1, S2 normal, no murmur, click, rub or gallop Abdomen: soft, non-tender; bowel sounds normal; no masses,  no organomegaly Extremities: extremities normal, atraumatic, no cyanosis or edema Pulses: 2+ and symmetric Lymph nodes: Cervical, supraclavicular, and axillary nodes normal. Neurologic: Grossly normal  Assessment/Plan:  Principal Problem:   Mycoplasma pneumonia Active Problems:   TTP (thrombotic thrombocytopenic purpura) (HCC)   Benign essential HTN   Chronic anticoagulation   Persistent atrial fibrillation (HCC)   CAD (coronary artery  disease)   Hypothyroidism   Bronchitis  Mycoplasma pneumonia Respiratory viral panel was positive for mycoplasma pneumoniae. A 1 week history of productive cough, subjective fevers, and chills and a chest X-ray showing bilateral interstitial infiltrates are consistent with this diagnosis. Negative procalcitonin is also consistent with an atypical pneumonia. Sputum culture is pending. Expiratory wheezes heard on pulmonary auscultation are likely reactive changes secondary to mycoplasma infection. - Azithromycin 250 mg QD - Received single dose of cefepime in the ED - Tylenol PRN for fever - Duo-Nebs q6h - Robitussin PRN for cough  Presyncope Head CT, electrolyte panel, EKG, and urinalysis are all within expected limits. Arterial blood gas was within normal limits except for a PO2 of 69. She has had no further episodes of presyncope since admission. She has not had any cardiac events recorded on telemetry. Her presyncopal episodes are not temporally related to coughing. Her neurological exam is normal. It seems most likely at this point that her presyncope was secondary to fatigue and malaise in the setting of pneumonia. She will be monitored on telemetry for another night, and will likely be appropriate for discharge tomorrow (6/7). - Continuous telemetry monitoring  Heart failure with preserved ejection fraction She is currently compensated. She appears euvolemic on exam, given her lack of peripheral edema or jugular venous distention. Given this clinical picture, the increased interstitial findings on her chest X-ray are more likely to represent interstitial infection from mycoplasma than pulmonary vascular congestion. - Imdur 30 mg QD - Benazepril 10 mg QD - Sotalol 120 mg BID - Lasix 20 mg QD  Coronary artery disease status post drug  eluting stent - Plavix 75 mg QD - Sotalol 120 mg BID - Benazepril 10 mg QD - Consider stopping Plavix  Paroxysmal atrial fibrillation on warfarin Rate  and rhythm are regular. 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  Hypertension She has remained moderately hypertensive throughout the day today, with BP ranging from 157/54 to 158/62. Will continue home medication regimen at this time given recent history of presyncope. - Imdur 30 mg QD - Benazepril 10 mg QD - Sotalol 120 mg BID  Hypothyroidism - Synthroid 50 mcg QD  DVT ppx: on warfarin Dispo: Possible discharge 6/7 Code status: Full code   LOS: 0 days   Doristine Johns, Medical Student 07/06/2017, 1:48 PM

## 2017-07-06 NOTE — Progress Notes (Signed)
Gave Duoneb at [facility]. Pt states made her feel a little better. Will continue to monitor.

## 2017-07-06 NOTE — Progress Notes (Signed)
Patient arrived to [facility] from ED. Patient alert and oriented. Admission assessment completed. No other complaints at this time. Will continue to monitor patient.

## 2017-07-06 NOTE — ED Notes (Signed)
Attempted report x1, floor stated they would call back when RN available

## 2017-07-06 NOTE — Progress Notes (Signed)
ANTICOAGULATION CONSULT NOTE - Initial Consult  Pharmacy Consult for Warfarin  Indication: atrial fibrillation  Patient Measurements: Height: 5\' 4"  (162.6 cm) Weight: 277 lb 4.8 oz (125.8 kg) IBW/kg (Calculated) : 54.7  Vital Signs: Temp: 98.1 F (36.7 C) (06/06 0110) Temp Source: Oral (06/06 0110) BP: 152/64 (06/06 0110) Pulse Rate: 51 (06/06 0110)  Labs: Recent Labs    07/05/17 1901 07/05/17 2140  HGB 14.7  --   HCT 46.0  --   PLT 495*  --   LABPROT  --  23.1*  INR  --  2.07  CREATININE 1.10*  --     Estimated Creatinine Clearance: 61.5 mL/min (A) (by C-G formula based on SCr of 1.1 mg/dL (H)).   Medical History: Past Medical History:  Diagnosis Date  . CAD (coronary artery disease)    a. LHC 3/17: mLAD 80, D1 50, pRCA 30, EF 25-35%; PCI: 3.5 x 16 mm Synergy DES to mLAD  . Chronic systolic CHF (congestive heart failure) ([place])    a. Echo 12/16: Mild concentric LVH, EF 25-30%, anteroseptal akinesis, inferoseptal akinesis, anterior akinesis, trivial AI, mild MR, moderate LAE, trivial TR, trivial PI, PASP 33 mmHg  //  b. Echo 2/17: Mild LVH, EF 25-30%, diffuse HK, mild LAE  //  c. Echo 7/17: mild LVH, EF 50-55%, no RWMA, trivial AI, mild to mod LAE, mild RAE  . Febrile seizure ([place]) 2004   "during TTP thing; cause my temp was 106"  . High cholesterol   . History of blood transfusion 2004   "several; related to TTP"  . Hypertension   . Hypothyroidism   . Ischemic cardiomyopathy   . Morbid obesity ([place]) 01/07/2013  . Osteoarthritis of back   . Paroxysmal atrial fibrillation ([place]) 08/01/2011   a. started on Sotalol during 10/2015 admission (Tikosyn too expensive)  . Pneumonia ?1999  . Scoliosis   . Thyroid nodule    "grew back after 2 partial thyroid surgeries" (09/29/2015)  . TTP (thrombotic thrombocytopenic purpura) ([place]) 05/03/2011    Assessment: 72 y/o F on warfarin PTA for afib, here for shortness of breath and cough, INR is therapeutic at 2.07, CBC  good  Warfarin PTA dosing: 7.5 mg on Wed/Fri/Sun, 5 mg all other days  Goal of Therapy:  INR 2-3 Monitor platelets by anticoagulation protocol: Yes   Plan:  Warfarin per home regimen Daily PT/INR Monitor for bleeding   Narda Bonds 07/06/2017,1:18 AM

## 2017-07-06 NOTE — Progress Notes (Signed)
[facility] for Warfarin  Indication: atrial fibrillation  Patient Measurements: Height: 5\' 4"  (162.6 cm) Weight: 277 lb 4.8 oz (125.8 kg) IBW/kg (Calculated) : 54.7  Vital Signs: Temp: 98 F (36.7 C) (06/06 0456) Temp Source: Oral (06/06 0456) BP: 157/74 (06/06 0456) Pulse Rate: 57 (06/06 0456)  Labs: Recent Labs    07/05/17 1901 07/05/17 2140 07/06/17 0150  HGB 14.7  --  13.6  HCT 46.0  --  41.7  PLT 495*  --  455*  LABPROT  --  23.1* 26.2*  INR  --  2.07 2.43  CREATININE 1.10*  --  0.92    Estimated Creatinine Clearance: 73.6 mL/min (by C-G formula based on SCr of 0.92 mg/dL).   Medical History: Past Medical History:  Diagnosis Date  . CAD (coronary artery disease)    a. LHC 3/17: mLAD 80, D1 50, pRCA 30, EF 25-35%; PCI: 3.5 x 16 mm Synergy DES to mLAD  . Chronic systolic CHF (congestive heart failure) ([place])    a. Echo 12/16: Mild concentric LVH, EF 25-30%, anteroseptal akinesis, inferoseptal akinesis, anterior akinesis, trivial AI, mild MR, moderate LAE, trivial TR, trivial PI, PASP 33 mmHg  //  b. Echo 2/17: Mild LVH, EF 25-30%, diffuse HK, mild LAE  //  c. Echo 7/17: mild LVH, EF 50-55%, no RWMA, trivial AI, mild to mod LAE, mild RAE  . Febrile seizure ([place]) 2004   "during TTP thing; cause my temp was 106"  . High cholesterol   . History of blood transfusion 2004   "several; related to TTP"  . Hypertension   . Hypothyroidism   . Ischemic cardiomyopathy   . Morbid obesity ([place]) 01/07/2013  . Osteoarthritis of back   . Paroxysmal atrial fibrillation ([place]) 08/01/2011   a. started on Sotalol during 10/2015 admission (Tikosyn too expensive)  . Pneumonia ?1999  . Scoliosis   . Thyroid nodule    "grew back after 2 partial thyroid surgeries" (09/29/2015)  . TTP (thrombotic thrombocytopenic purpura) ([place]) 05/03/2011    Assessment: 72 y/o F on warfarin PTA for afib, here for shortness of breath and cough, Admit INR is therapeutic  at 2.07. This morning INR up to 2.4, will leave the patient on home dose for now, but would recommend reducing dose if INR continues to increase. The patient is on azithromycin and there is a slight incidence of this antibiotic interacting with warfarin. Pharmacy will continue to monitor closely. CBC WNL   Warfarin PTA dosing: 7.5 mg on Wed/Fri/Sun, 5 mg all other days  Goal of Therapy:  INR 2-3 Monitor platelets by anticoagulation protocol: Yes   Plan:  Warfarin per home regimen Daily PT/INR Monitor for bleeding   Allan Minotti L Mathew Postiglione 07/06/2017,8:35 AM

## 2017-07-07 DIAGNOSIS — D696 Thrombocytopenia, unspecified: Secondary | ICD-10-CM | POA: Diagnosis not present

## 2017-07-07 DIAGNOSIS — J157 Pneumonia due to Mycoplasma pneumoniae: Secondary | ICD-10-CM | POA: Diagnosis not present

## 2017-07-07 DIAGNOSIS — R55 Syncope and collapse: Secondary | ICD-10-CM | POA: Diagnosis not present

## 2017-07-07 DIAGNOSIS — I5030 Unspecified diastolic (congestive) heart failure: Secondary | ICD-10-CM | POA: Diagnosis not present

## 2017-07-07 DIAGNOSIS — D72829 Elevated white blood cell count, unspecified: Secondary | ICD-10-CM | POA: Diagnosis not present

## 2017-07-07 DIAGNOSIS — Z7901 Long term (current) use of anticoagulants: Secondary | ICD-10-CM | POA: Diagnosis not present

## 2017-07-07 DIAGNOSIS — Z9081 Acquired absence of spleen: Secondary | ICD-10-CM | POA: Diagnosis not present

## 2017-07-07 DIAGNOSIS — I110 Hypertensive heart disease with heart failure: Secondary | ICD-10-CM | POA: Diagnosis not present

## 2017-07-07 DIAGNOSIS — Z955 Presence of coronary angioplasty implant and graft: Secondary | ICD-10-CM | POA: Diagnosis not present

## 2017-07-07 DIAGNOSIS — E039 Hypothyroidism, unspecified: Secondary | ICD-10-CM | POA: Diagnosis not present

## 2017-07-07 DIAGNOSIS — I2510 Atherosclerotic heart disease of native coronary artery without angina pectoris: Secondary | ICD-10-CM | POA: Diagnosis not present

## 2017-07-07 DIAGNOSIS — I480 Paroxysmal atrial fibrillation: Secondary | ICD-10-CM | POA: Diagnosis not present

## 2017-07-07 LAB — CBC
HCT: 42.3 % (ref 36.0–46.0)
HEMOGLOBIN: 13.7 g/dL (ref 12.0–15.0)
MCH: 29.1 pg (ref 26.0–34.0)
MCHC: 32.4 g/dL (ref 30.0–36.0)
MCV: 90 fL (ref 78.0–100.0)
PLATELETS: 517 10*3/uL — AB (ref 150–400)
RBC: 4.7 MIL/uL (ref 3.87–5.11)
RDW: 15.2 % (ref 11.5–15.5)
WBC: 9.5 10*3/uL (ref 4.0–10.5)

## 2017-07-07 LAB — BASIC METABOLIC PANEL
Anion gap: 6 (ref 5–15)
BUN: 11 mg/dL (ref 6–20)
CHLORIDE: 106 mmol/L (ref 101–111)
CO2: 27 mmol/L (ref 22–32)
CREATININE: 0.94 mg/dL (ref 0.44–1.00)
Calcium: 8.2 mg/dL — ABNORMAL LOW (ref 8.9–10.3)
GFR calc Af Amer: >60 mL/min (ref >60)
GFR calc non Af Amer: 60 mL/min — ABNORMAL LOW (ref >60)
Glucose, Bld: 121 mg/dL — ABNORMAL HIGH (ref 65–99)
Potassium: 3.9 mmol/L (ref 3.5–5.1)
SODIUM: 139 mmol/L (ref 135–145)

## 2017-07-07 LAB — PROTIME-INR
INR: 2.07
PROTHROMBIN TIME: 23.1 s — AB (ref 11.4–15.2)

## 2017-07-07 MED ORDER — SODIUM CHLORIDE 0.9 % IV SOLN
500.0000 mg | INTRAVENOUS | Status: DC
  Administered 2017-07-08: 500 mg via INTRAVENOUS
  Filled 2017-07-07: qty 500

## 2017-07-07 MED ORDER — ALBUTEROL SULFATE (2.5 MG/3ML) 0.083% IN NEBU
2.5000 mg | INHALATION_SOLUTION | Freq: Four times a day (QID) | RESPIRATORY_TRACT | Status: DC | PRN
  Administered 2017-07-08: 2.5 mg via RESPIRATORY_TRACT
  Filled 2017-07-07: qty 3

## 2017-07-07 MED ORDER — IPRATROPIUM-ALBUTEROL 0.5-2.5 (3) MG/3ML IN SOLN: 3.0000 mL | Freq: Three times a day (TID) | RESPIRATORY_TRACT | Status: DC

## 2017-07-07 NOTE — Progress Notes (Signed)
Subjective: [name] says she feels somewhat better this morning, but not well enough to go home. She continues to complain of cough. The cough is mostly dry, but is occasionally productive of some mild sputum. She also complains of fatigue, malaise, and feeling light-headed when she sits up.  Objective:  Vital signs in last 24 hours: Vitals:   07/06/17 2019 07/07/17 0516 07/07/17 0914 07/07/17 1301  BP:  (!) 157/62  (!) 163/59  Pulse:  (!) 58  (!) 54  Resp:  (!) 22  18  Temp:  98.5 F (36.9 C)  97.6 F (36.4 C)  TempSrc:  Oral  Oral  SpO2: 97% 90% 98% 97%  Weight:  125.8 kg (277 lb 4.8 oz)    Height:       Weight change: -1.218 kg (-2 lb 11 oz)  Intake/Output Summary (Last 24 hours) at 07/07/2017 1322 Last data filed at 07/07/2017 1048 Gross per 24 hour  Intake 480 ml  Output 1700 ml  Net -1220 ml   General appearance: alert, cooperative, appears stated age, no distress and morbidly obese Head: Normocephalic, without obvious abnormality, atraumatic Throat: lips, mucosa, and tongue normal; teeth and gums normal Neck: no adenopathy, no carotid bruit, no JVD, supple, symmetrical, trachea midline and thyroid not enlarged, symmetric, no tenderness/mass/nodules Lungs: wheezes mild inspiratory wheezes, prominant expiratory wheezes Heart: regular rate and rhythm, S1, S2 normal, no murmur, click, rub or gallop Abdomen: soft, non-tender; bowel sounds normal; no masses,  no organomegaly Extremities: extremities normal, atraumatic, no cyanosis or edema Pulses: 2+ and symmetric Lymph nodes: Cervical, supraclavicular, and axillary nodes normal. Neurologic: Grossly normal  Assessment/Plan:  Principal Problem:   Mycoplasma pneumonia Active Problems:   TTP (thrombotic thrombocytopenic purpura) (HCC)   Benign essential HTN   Chronic anticoagulation   Persistent atrial fibrillation (HCC)   CAD (coronary artery disease)   Hypothyroidism   Bronchitis   Postural dizziness with  presyncope   History of splenectomy   History of TTP (thrombotic thrombocytopenic purpura)   PAF (paroxysmal atrial fibrillation) (HCC)  Mycoplasma pneumonia Respiratory viral panel was positive for mycoplasma pneumoniae. A 1 week history of productive cough, subjective fevers, and chills and a chest X-ray showing some possible faint bilateral interstitial infiltrates are consistent with this diagnosis. Negative procalcitonin is also consistent with an atypical pneumonia. Sputum culture is pending. Expiratory wheezes heard on pulmonary auscultation are likely reactive changes secondary to mycoplasma infection. - Azithromycin 250 mg QD 6/5 >> - Received single dose of cefepime in the ED - Tylenol PRN for fever - Duo-Nebs q6h - Robitussin PRN for cough  Presyncope Head CT, electrolyte panel, EKG, and urinalysis are all within expected limits. Arterial blood gas was within normal limits except for a PO2 of 69. She has had no further episodes of presyncope since admission. She has not had any cardiac events recorded on telemetry. Her presyncopal episodes are not temporally related to coughing. Her neurological exam is normal. It seems most likely at this point that her "light headedness" was secondary to fatigue and malaise in the setting of pneumonia. - Continuous telemetry monitoring  Heart failure with preserved ejection fraction She is currently compensated. She appears euvolemic on exam, given her lack of peripheral edema or jugular venous distention. Given this clinical picture, the increased interstitial findings on her chest X-ray are more likely to represent interstitial infection from mycoplasma than pulmonary vascular congestion. - Imdur 30 mg QD - Benazepril 10 mg QD - Sotalol 120 mg BID -  Lasix 20 mg QD  Coronary artery disease status post drug eluting stent She has now been on Plavix for 2 years since her last cardiac event. She is also anticoagulated on warfarin. At this point,  the bleeding risk associated with staying on Plavix likely outweighs the cardiovascular benefit. - Stop Plavix - Sotalol 120 mg BID - Benazepril 10 mg QD  Paroxysmal atrial fibrillation on warfarin Rate and rhythm are regular. 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  Hypertension She has remained moderately hypertensive throughout the day today, with BP ranging from 139/52 to 157/62. Will continue home medication regimen at this time given recent history of presyncope. - Imdur 30 mg QD - Benazepril 10 mg QD - Sotalol 120 mg BID  Hypothyroidism - Synthroid 50 mcg QD  DVT ppx: on warfarin Dispo: Possible discharge 6/8 Code status: Full code  Doristine Johns, Medical Student 07/07/2017, 1:22 PM   Attestation for Student Documentation:  I personally was present and performed or re-performed the history, physical exam and medical decision-making activities of this service and have verified that the service and findings are accurately documented in the student's note.  Ledell Noss, MD 07/08/2017, 1:28 PM

## 2017-07-07 NOTE — Discharge Instructions (Addendum)
You were hospitalized for pneumonia, an infection of your lungs. We treated you with 4 days of antibiotics. We are glad you are feeling a little better and will be able to go home. If you do start feeling worse, experience incresed light-headedness, dizziness, or shortness of breath, you should come back to the hospital. If not, we will look forward to seeing you at your follow-up clinic visit listed below. It has been a pleasure taking care of you, and we wish you all the best!  - the hematologist recommended that you stop taking the plavix now that your stent was 2 years ago and because you were on warfarin. This can also be associated with TTP.   Please call our clinic if you have any questions or concerns, we may be able to help and keep you from a long and expensive emergency room wait. Our clinic and after hours phone number is [phone], the best time to call is Monday through Friday 9 am to 4 pm but there is always someone available 24/7 if you have an emergency. If you need medication refills please notify your pharmacy one week in advance and they will send [place] a request.     Information on my medicine - Coumadin   (Warfarin)  This medication education was reviewed with me or my healthcare representative as part of my discharge preparation.  The pharmacist that spoke with me during my hospital stay was:  Saundra Shelling, [facility]  Why was Coumadin prescribed for you? Coumadin was prescribed for you because you have a blood clot or a medical condition that can cause an increased risk of forming blood clots. Blood clots can cause serious health problems by blocking the flow of blood to the heart, lung, or brain. Coumadin can prevent harmful blood clots from forming. As a reminder your indication for Coumadin is:   Stroke Prevention Because Of Atrial Fibrillation  What test will check on my response to Coumadin? While on Coumadin (warfarin) you will need to have an INR test regularly to ensure  that your dose is keeping you in the desired range. The INR (international normalized ratio) number is calculated from the result of the laboratory test called prothrombin time (PT).  If an INR APPOINTMENT HAS NOT ALREADY BEEN MADE FOR YOU please schedule an appointment to have this lab work done by your health care provider within 7 days. Your INR goal is usually a number between:  2 to 3 or your provider may give you a more narrow range like 2-2.5.  Ask your health care provider during an office visit what your goal INR is.  What  do you need to  know  About  COUMADIN? Take Coumadin (warfarin) exactly as prescribed by your healthcare provider about the same time each day.  DO NOT stop taking without talking to the doctor who prescribed the medication.  Stopping without other blood clot prevention medication to take the place of Coumadin may increase your risk of developing a new clot or stroke.  Get refills before you run out.  What do you do if you miss a dose? If you miss a dose, take it as soon as you remember on the same day then continue your regularly scheduled regimen the next day.  Do not take two doses of Coumadin at the same time.  Important Safety Information A possible side effect of Coumadin (Warfarin) is an increased risk of bleeding. You should call your healthcare provider right away if you  experience any of the following: ? Bleeding from an injury or your nose that does not stop. ? Unusual colored urine (red or dark brown) or unusual colored stools (red or black). ? Unusual bruising for unknown reasons. ? A serious fall or if you hit your head (even if there is no bleeding).  Some foods or medicines interact with Coumadin (warfarin) and might alter your response to warfarin. To help avoid this: ? Eat a balanced diet, maintaining a consistent amount of Vitamin K. ? Notify your provider about major diet changes you plan to make. ? Avoid alcohol or limit your intake to 1 drink  for women and 2 drinks for men per day. (1 drink is 5 oz. wine, 12 oz. beer, or 1.5 oz. liquor.)  Make sure that ANY health care provider who prescribes medication for you knows that you are taking Coumadin (warfarin).  Also make sure the healthcare provider who is monitoring your Coumadin knows when you have started a new medication including herbals and non-prescription products.  Coumadin (Warfarin)  Major Drug Interactions  Increased Warfarin Effect Decreased Warfarin Effect  Alcohol (large quantities) Antibiotics (esp. Septra/Bactrim, Flagyl, Cipro) Amiodarone (Cordarone) Aspirin (ASA) Cimetidine (Tagamet) Megestrol (Megace) NSAIDs (ibuprofen, naproxen, etc.) Piroxicam (Feldene) Propafenone (Rythmol SR) Propranolol (Inderal) Isoniazid (INH) Posaconazole (Noxafil) Barbiturates (Phenobarbital) Carbamazepine (Tegretol) Chlordiazepoxide (Librium) Cholestyramine (Questran) Griseofulvin Oral Contraceptives Rifampin Sucralfate (Carafate) Vitamin K   Coumadin (Warfarin) Major Herbal Interactions  Increased Warfarin Effect Decreased Warfarin Effect  Garlic Ginseng Ginkgo biloba Coenzyme Q10 Green tea St. Johns wort    Coumadin (Warfarin) FOOD Interactions  Eat a consistent number of servings per week of foods HIGH in Vitamin K (1 serving =  cup)  Collards (cooked, or boiled & drained) Kale (cooked, or boiled & drained) Mustard greens (cooked, or boiled & drained) Parsley *serving size only =  cup Spinach (cooked, or boiled & drained) Swiss chard (cooked, or boiled & drained) Turnip greens (cooked, or boiled & drained)  Eat a consistent number of servings per week of foods MEDIUM-HIGH in Vitamin K (1 serving = 1 cup)  Asparagus (cooked, or boiled & drained) Broccoli (cooked, boiled & drained, or raw & chopped) Brussel sprouts (cooked, or boiled & drained) *serving size only =  cup Lettuce, raw (green leaf, endive, romaine) Spinach, raw Turnip greens, raw &  chopped   These websites have more information on Coumadin (warfarin):  FailFactory.se; VeganReport.com.au;

## 2017-07-07 NOTE — Progress Notes (Signed)
ANTICOAGULATION CONSULT NOTE  Pharmacy Consult:  Coumadin Indication: atrial fibrillation  Patient Measurements: Height: 5\' 4"  (162.6 cm) Weight: 277 lb 4.8 oz (125.8 kg) IBW/kg (Calculated) : 54.7  Vital Signs: Temp: 98.5 F (36.9 C) (06/07 0516) Temp Source: Oral (06/07 0516) BP: 157/62 (06/07 0516) Pulse Rate: 58 (06/07 0516)  Labs: Recent Labs    07/05/17 1901 07/05/17 2140 07/06/17 0150 07/07/17 0535 07/07/17 0829  HGB 14.7  --  13.6  --  13.7  HCT 46.0  --  41.7  --  42.3  PLT 495*  --  455*  --  517*  LABPROT  --  23.1* 26.2* 23.1*  --   INR  --  2.07 2.43 2.07  --   CREATININE 1.10*  --  0.92  --  0.94    Estimated Creatinine Clearance: 72 mL/min (by C-G formula based on SCr of 0.94 mg/dL).     Assessment: 1 YOF here with productive cough and is being treated with azithromycin.  Pharmacy has been consulted to continue Coumadin from PTA for history of AFib.  INR therapeutic on admit on home regimen and remains therapeutic today.  No bleeding reported.   Goal of Therapy:  INR 2-3 Monitor platelets by anticoagulation protocol: Yes    Plan:  Coumadin 5mg  PO daily except 7.5mg  on Wed, Fri and Sun Daily PT / INR If remains hospitalized, consider reducing INR checks   Markeda Narvaez D. Mina Marble, PharmD, BCPS, BCCCP Pager:  [phone] 07/07/2017, 9:59 AM

## 2017-07-07 NOTE — Progress Notes (Addendum)
Called [facility] for PRN and he explained pt just had a treatment

## 2017-07-08 DIAGNOSIS — D473 Essential (hemorrhagic) thrombocythemia: Secondary | ICD-10-CM | POA: Diagnosis not present

## 2017-07-08 DIAGNOSIS — I481 Persistent atrial fibrillation: Secondary | ICD-10-CM | POA: Diagnosis not present

## 2017-07-08 DIAGNOSIS — E041 Nontoxic single thyroid nodule: Secondary | ICD-10-CM | POA: Diagnosis not present

## 2017-07-08 DIAGNOSIS — D72829 Elevated white blood cell count, unspecified: Secondary | ICD-10-CM | POA: Diagnosis not present

## 2017-07-08 DIAGNOSIS — I5030 Unspecified diastolic (congestive) heart failure: Secondary | ICD-10-CM | POA: Diagnosis not present

## 2017-07-08 DIAGNOSIS — I255 Ischemic cardiomyopathy: Secondary | ICD-10-CM | POA: Diagnosis not present

## 2017-07-08 DIAGNOSIS — D696 Thrombocytopenia, unspecified: Secondary | ICD-10-CM | POA: Diagnosis not present

## 2017-07-08 DIAGNOSIS — Z79890 Hormone replacement therapy (postmenopausal): Secondary | ICD-10-CM | POA: Diagnosis not present

## 2017-07-08 DIAGNOSIS — M419 Scoliosis, unspecified: Secondary | ICD-10-CM | POA: Diagnosis not present

## 2017-07-08 DIAGNOSIS — I2510 Atherosclerotic heart disease of native coronary artery without angina pectoris: Secondary | ICD-10-CM | POA: Diagnosis not present

## 2017-07-08 DIAGNOSIS — J40 Bronchitis, not specified as acute or chronic: Secondary | ICD-10-CM | POA: Diagnosis not present

## 2017-07-08 DIAGNOSIS — I5042 Chronic combined systolic (congestive) and diastolic (congestive) heart failure: Secondary | ICD-10-CM | POA: Diagnosis not present

## 2017-07-08 DIAGNOSIS — Z8701 Personal history of pneumonia (recurrent): Secondary | ICD-10-CM | POA: Diagnosis not present

## 2017-07-08 DIAGNOSIS — J157 Pneumonia due to Mycoplasma pneumoniae: Secondary | ICD-10-CM | POA: Diagnosis not present

## 2017-07-08 DIAGNOSIS — I480 Paroxysmal atrial fibrillation: Secondary | ICD-10-CM | POA: Diagnosis not present

## 2017-07-08 DIAGNOSIS — Z8249 Family history of ischemic heart disease and other diseases of the circulatory system: Secondary | ICD-10-CM | POA: Diagnosis not present

## 2017-07-08 DIAGNOSIS — R05 Cough: Secondary | ICD-10-CM | POA: Diagnosis present

## 2017-07-08 DIAGNOSIS — R55 Syncope and collapse: Secondary | ICD-10-CM | POA: Diagnosis not present

## 2017-07-08 DIAGNOSIS — Z7901 Long term (current) use of anticoagulants: Secondary | ICD-10-CM | POA: Diagnosis not present

## 2017-07-08 DIAGNOSIS — Z79899 Other long term (current) drug therapy: Secondary | ICD-10-CM | POA: Diagnosis not present

## 2017-07-08 DIAGNOSIS — M311 Thrombotic microangiopathy: Secondary | ICD-10-CM | POA: Diagnosis not present

## 2017-07-08 DIAGNOSIS — M479 Spondylosis, unspecified: Secondary | ICD-10-CM | POA: Diagnosis not present

## 2017-07-08 DIAGNOSIS — E7800 Pure hypercholesterolemia, unspecified: Secondary | ICD-10-CM | POA: Diagnosis not present

## 2017-07-08 DIAGNOSIS — Z9081 Acquired absence of spleen: Secondary | ICD-10-CM | POA: Diagnosis not present

## 2017-07-08 DIAGNOSIS — E039 Hypothyroidism, unspecified: Secondary | ICD-10-CM | POA: Diagnosis not present

## 2017-07-08 DIAGNOSIS — Z6841 Body Mass Index (BMI) 40.0 and over, adult: Secondary | ICD-10-CM | POA: Diagnosis not present

## 2017-07-08 DIAGNOSIS — Z955 Presence of coronary angioplasty implant and graft: Secondary | ICD-10-CM | POA: Diagnosis not present

## 2017-07-08 DIAGNOSIS — I110 Hypertensive heart disease with heart failure: Secondary | ICD-10-CM | POA: Diagnosis not present

## 2017-07-08 DIAGNOSIS — Z85828 Personal history of other malignant neoplasm of skin: Secondary | ICD-10-CM | POA: Diagnosis not present

## 2017-07-08 LAB — CBC
HEMATOCRIT: 42.6 % (ref 36.0–46.0)
HEMOGLOBIN: 13.2 g/dL (ref 12.0–15.0)
MCH: 28.8 pg (ref 26.0–34.0)
MCHC: 31 g/dL (ref 30.0–36.0)
MCV: 93 fL (ref 78.0–100.0)
PLATELETS: 520 10*3/uL — AB (ref 150–400)
RBC: 4.58 MIL/uL (ref 3.87–5.11)
RDW: 15.6 % — AB (ref 11.5–15.5)
WBC: 9.3 10*3/uL (ref 4.0–10.5)

## 2017-07-08 LAB — BASIC METABOLIC PANEL
ANION GAP: 10 (ref 5–15)
BUN: 11 mg/dL (ref 6–20)
CALCIUM: 8.2 mg/dL — AB (ref 8.9–10.3)
CO2: 26 mmol/L (ref 22–32)
Chloride: 103 mmol/L (ref 101–111)
Creatinine, Ser: 0.9 mg/dL (ref 0.44–1.00)
GFR calc Af Amer: >60 mL/min (ref >60)
Glucose, Bld: 105 mg/dL — ABNORMAL HIGH (ref 65–99)
Potassium: 4.7 mmol/L (ref 3.5–5.1)
Sodium: 139 mmol/L (ref 135–145)

## 2017-07-08 LAB — PROTIME-INR
INR: 2.12
Prothrombin Time: 23.6 seconds — ABNORMAL HIGH (ref 11.4–15.2)

## 2017-07-08 LAB — CULTURE, RESPIRATORY W GRAM STAIN

## 2017-07-08 LAB — CULTURE, RESPIRATORY: CULTURE: NORMAL

## 2017-07-08 LAB — GLUCOSE, CAPILLARY: Glucose-Capillary: 124 mg/dL — ABNORMAL HIGH (ref 65–99)

## 2017-07-08 MED ORDER — IPRATROPIUM-ALBUTEROL 0.5-2.5 (3) MG/3ML IN SOLN
3.0000 mL | Freq: Three times a day (TID) | RESPIRATORY_TRACT | Status: DC
  Administered 2017-07-08: 3 mL via RESPIRATORY_TRACT
  Filled 2017-07-08: qty 3

## 2017-07-08 MED ORDER — BENZONATATE 100 MG PO CAPS
200.0000 mg | ORAL_CAPSULE | Freq: Two times a day (BID) | ORAL | Status: DC
  Administered 2017-07-08: 200 mg via ORAL
  Filled 2017-07-08 (×2): qty 2

## 2017-07-08 MED ORDER — ALBUTEROL SULFATE (2.5 MG/3ML) 0.083% IN NEBU: 2.5000 mg | INHALATION_SOLUTION | RESPIRATORY_TRACT | Status: DC | PRN

## 2017-07-08 MED ORDER — IPRATROPIUM-ALBUTEROL 0.5-2.5 (3) MG/3ML IN SOLN
3.0000 mL | Freq: Four times a day (QID) | RESPIRATORY_TRACT | Status: DC
  Administered 2017-07-08: 3 mL via RESPIRATORY_TRACT
  Filled 2017-07-08: qty 3

## 2017-07-08 MED ORDER — IPRATROPIUM-ALBUTEROL 0.5-2.5 (3) MG/3ML IN SOLN
3.0000 mL | Freq: Four times a day (QID) | RESPIRATORY_TRACT | Status: DC
  Administered 2017-07-08 – 2017-07-09 (×2): 3 mL via RESPIRATORY_TRACT
  Filled 2017-07-08 (×4): qty 3

## 2017-07-08 NOTE — Progress Notes (Signed)
Patient states that she does not feel right.  She feels light headed even laying in bed.  States she feels like she is in a fog and is having blurry vision.   Orthostatic vital signs obtained, oxygen saturation 96%, CBG 124. Rounding team in hallway and notified.  State they will see patient.  Will continue to monitor.

## 2017-07-08 NOTE — Care Management Obs Status (Signed)
[place] NOTIFICATION   Patient Details  Name: [name] MRN: [id number] Date of Birth: [birth year]   Medicare Observation Status Notification Given:  Yes    Bethena Roys, RN 07/08/2017, 3:59 PM

## 2017-07-08 NOTE — Evaluation (Addendum)
Physical Therapy Evaluation Patient Details Name: [name] MRN: [id number] DOB: [birth year] Today's Date: 07/08/2017   History of Present Illness  [name] is a 72 yo with PMH of atrial fibrillation on warfarin, CAD s/p DES placement in 2017, HFpEF (LVEF 55-60%, G2DD), HTN, hypothyroidism, and obesity who is presenting for evaluation of productive cough and pre-syncope     Clinical Impression  Pt admitted with above diagnosis. Pt currently with functional limitations due to the deficits listed below (see PT Problem List). PTA, pt living at home alone, mod I with limited community ambulation, mainly staying inside home. Upon eval pt presents with mild imbalance and decreased activity tolerance. Pt able to walk without physical assistance for short distances into hallway and back to room without AD with unsteady gait. Discussed that utilized RW would be safer for patient, she states she is unlikely to use one as she has furniture in her home she can use. SpO2 remained >93% during session on RA, HR mid 80's with activity. Pt did not become dizzy with supine-sit-stand transfers. Pt eager to work with HHPT to regain strength and improve balance.   Pt will benefit from skilled PT to increase their independence and safety with mobility to allow discharge to the venue listed below.       Follow Up Recommendations Home health PT    Equipment Recommendations  None recommended by PT    Recommendations for Other Services       Precautions / Restrictions Precautions Precautions: Fall Restrictions Weight Bearing Restrictions: No      Mobility  Bed Mobility Overal bed mobility: Independent                Transfers Overall transfer level: Independent Equipment used: None                Ambulation/Gait Ambulation/Gait assistance: Min Dispensing optician (Feet): 80 Feet Assistive device: None Gait Pattern/deviations: Step-to pattern;Staggering  left;Staggering right Gait velocity: decrease   General Gait Details: Patient unsteady on her feet, at home she uses fruniture for UE support. when walking without AD in hallway she is unsteady, recomendeded use of RW but patient states she is unlikely to use it.   Stairs            Wheelchair Mobility    Modified Rankin (Stroke Patients Only)       Balance Overall balance assessment: Needs assistance   Sitting balance-Leahy Scale: Good       Standing balance-Leahy Scale: Fair Standing balance comment: ambulates without AD, unsteady would benefit from UE support                             Pertinent Vitals/Pain Pain Assessment: No/denies pain    Home Living Family/patient expects to be discharged to:: Private residence Living Arrangements: Alone Available Help at Discharge: Family;Available PRN/intermittently Type of Home: House Home Access: Level entry     Home Layout: One level Home Equipment: Shower seat;Bedside commode;Walker - 4 wheels;Hand held shower head      Prior Function Level of Independence: Needs assistance   Gait / Transfers Assistance Needed: mostly household ambulation without AD holding onto fruniture, RW for in public doesnt go as much   ADL's / Homemaking Assistance Needed: Niece does grocery shopping and cleaning. Pt does all her own ADLs otherwise.         Hand Dominance   Dominant Hand: Right    Extremity/Trunk Assessment  Upper Extremity Assessment Upper Extremity Assessment: Overall WFL for tasks assessed    Lower Extremity Assessment Lower Extremity Assessment: Overall WFL for tasks assessed       Communication   Communication: No difficulties  Cognition Arousal/Alertness: Awake/alert Behavior During Therapy: WFL for tasks assessed/performed Overall Cognitive Status: Within Functional Limits for tasks assessed                                        General Comments      Exercises      Assessment/Plan    PT Assessment Patient needs continued PT services  PT Problem List Decreased strength;Decreased range of motion;Decreased activity tolerance;Decreased balance       PT Treatment Interventions Gait training;Stair training;Functional mobility training;Therapeutic exercise;Therapeutic activities    PT Goals (Current goals can be found in the Care Plan section)  Acute Rehab PT Goals Patient Stated Goal: go home, HHPT PT Goal Formulation: With patient Time For Goal Achievement: 07/22/17 Potential to Achieve Goals: Good    Frequency Min 3X/week   Barriers to discharge Decreased caregiver support lives alone    Co-evaluation               AM-PAC PT "6 Clicks" Daily Activity  Outcome Measure Difficulty turning over in bed (including adjusting bedclothes, sheets and blankets)?: None Difficulty moving from lying on back to sitting on the side of the bed? : None Difficulty sitting down on and standing up from a chair with arms (e.g., wheelchair, bedside commode, etc,.)?: None Help needed moving to and from a bed to chair (including a wheelchair)?: A Little Help needed walking in hospital room?: A Little Help needed climbing 3-5 steps with a railing? : A Little 6 Click Score: 21    End of Session Equipment Utilized During Treatment: Gait belt Activity Tolerance: Patient tolerated treatment well Patient left: in chair;with call bell/phone within reach Nurse Communication: Mobility status PT Visit Diagnosis: Unsteadiness on feet (R26.81);Difficulty in walking, not elsewhere classified (R26.2)    Time: 9390-3009 PT Time Calculation (min) (ACUTE ONLY): 27 min   Charges:   PT Evaluation $PT Eval Low Complexity: 1 Low PT Treatments $Gait Training: 8-22 mins   PT G Codes:        Reinaldo Berber, PT, DPT Acute Rehab Services Pager: [phone]    Reinaldo Berber 07/08/2017, 9:56 PM

## 2017-07-08 NOTE — Progress Notes (Addendum)
Subjective: [name] feels "about the same" this morning. She continues to complain of bothersome cough, that is now slightly more productive than it was yesterday. She continues to complain of fatigue, malaise, and light-headedness when she sits up. That being said, she feels like she might be able to go home today.   Objective: Vital signs in last 24 hours: Vitals:   07/08/17 0110 07/08/17 0500 07/08/17 0935 07/08/17 1155  BP:  (!) 151/70  (!) 153/79  Pulse:  70  (!) 56  Resp:  15  18  Temp:  98.5 F (36.9 C)  98.1 F (36.7 C)  TempSrc:  Oral  Oral  SpO2: 96% 93% 95% 95%  Weight:  126.7 kg (279 lb 4.8 oz)    Height:       Weight change: 0.907 kg (2 lb)  Intake/Output Summary (Last 24 hours) at 07/08/2017 1224 Last data filed at 07/08/2017 1106 Gross per 24 hour  Intake 126 ml  Output 1500 ml  Net -1374 ml   General appearance: alert, cooperative, appears stated age, no distress and morbidly obese Head: Normocephalic, without obvious abnormality, atraumatic Lungs: wheezes mild inspiratory wheezes, prominant expiratory wheezes Heart: regular rate and rhythm, S1, S2 normal, no murmur, click, rub or gallop Abdomen: soft, non-tender; bowel sounds normal; no masses,  no organomegaly Extremities: extremities normal, atraumatic, no cyanosis or edema Pulses: 2+ and symmetric Neurologic: Grossly normal  Assessment/Plan:  Principal Problem:   Mycoplasma pneumonia Active Problems:   TTP (thrombotic thrombocytopenic purpura) (HCC)   Benign essential HTN   Chronic anticoagulation   Persistent atrial fibrillation (HCC)   CAD (coronary artery disease)   Hypothyroidism   Bronchitis   Postural dizziness with presyncope   History of splenectomy   History of TTP (thrombotic thrombocytopenic purpura)   PAF (paroxysmal atrial fibrillation) (HCC)  Mycoplasma pneumonia Respiratory viral panel was positive for mycoplasma pneumoniae. A 1 week history of productive cough, subjective  fevers, and chills and a chest X-ray showing some possible faint bilateral interstitial infiltrates are consistent with this diagnosis. Negative procalcitonin is also consistent with an atypical pneumonia. Sputum culture is pending. Expiratory wheezes heard on pulmonary auscultation are likely reactive changes secondary to mycoplasma infection. - Azithromycin 250 mg QD 6/5 >> 6/8  - Received single dose of cefepime in the ED - Tylenol PRN for fever - Duo-Nebs q6h while awake  - Robitussin PRN for cough - Blood cultures show no growth at 3 days  - Sputum culture shows normal respiratory flora  Presyncope Head CT, electrolyte panel, EKG, and urinalysis are all within expected limits. Arterial blood gas was within normal limits except for a PO2 of 69. She has had no further episodes of presyncope since admission. She has not had any cardiac events recorded on telemetry. Her presyncopal episodes are not temporally related to coughing. Her neurological exam is normal. It seems most likely at this point that her "light headedness" was secondary to fatigue and malaise in the setting of pneumonia. - Continuous telemetry monitoring - orthostatic vital signs taken today are consistent with appropriate blood pressure response   - PT eval and treat  Heart failure with preserved ejection fraction She is currently compensated. She appears euvolemic on exam, given her lack of peripheral edema or jugular venous distention.  - Imdur 30 mg QD - Benazepril 10 mg QD - Sotalol 120 mg BID - Lasix 20 mg QD  Coronary artery disease status post drug eluting stent She has now been on Plavix  for 2 years since her last cardiac event. She is also anticoagulated on warfarin. At this point, the bleeding risk associated with staying on Plavix likely outweighs the cardiovascular benefit. - Plavix stopped 6/7 - Sotalol 120 mg BID  - Benazepril 10 mg QD  Paroxysmal atrial fibrillation on warfarin Rate and rhythm are  regular. 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  Hypertension She has remained moderately hypertensive throughout the day today, with BP ranging from 129/61 to 136/59. Will continue home medication regimen at this time given recent history of presyncope. - Imdur 30 mg QD - Benazepril 10 mg QD - Sotalol 120 mg BID  Hypothyroidism - Synthroid 50 mcg QD  DVT ppx: on warfarin Dispo: Likely discharge 6/9 Code status: Full code  Doristine Johns, Medical Student 07/08/2017, 12:24 PM   Attestation for Student Documentation:  I personally was present and performed or re-performed the history, physical exam and medical decision-making activities of this service and have verified that the service and findings are accurately documented in the student's note.  Ledell Noss, MD 07/08/2017, 4:54 PM

## 2017-07-08 NOTE — Care Management Note (Signed)
Case Management Note  Patient Details  Name: RAIZY AUZENNE MRN: [id number] Date of Birth: 1945-10-10  Subjective/Objective: Pt presented for Cough and new diagnosis of Pneumonia. PTA Independent from home. Daughter at the bedside. Plan will be to transition home 07-09-17. Pt states she has a RW however the halls are to narrow to use in the home.                    Action/Plan: CM did talk to patient and daughter in regards to disposition needs. Patient states family will be out of town for a wedding and she feels that she is deconditioned and needs additional help. CM did ask for PT consult. Staff to ambulate and see if patient qualifies for home 02. Pt states she gets SOB with activity. CM did discuss [facility] with the patient and daughter. Pt states that [facility] RN, PT, Aide will be beneficial. Pt will need orders for services. Staff RN to see if needs 02- pt wants to use [facility] for [place]- has used in the past. If needs DME will utilize [facility] as well. Referral given to [facility] for Services. Plan will be for home on 07-09-17. No further needs from CM at this time.   Expected Discharge Date:  07/09/17               Expected Discharge Plan:  [place]  In-House Referral:  NA  Discharge planning Services  CM Consult  Post Acute Care Choice:  Home Health Choice offered to:  Patient  DME Arranged:    DME Agency:     HH Arranged:  RN, Disease Management, PT, Nurse's Aide [place] Agency:  [place]  Status of Service:  In process, will continue to follow  If discussed at Long Length of Stay Meetings, dates discussed:    Additional Comments:  Bethena Roys, RN 07/08/2017, 4:06 PM

## 2017-07-08 NOTE — Progress Notes (Signed)
  Date: 07/08/2017  Patient name: [name]  Medical record number: [id number]  Date of birth: September 06, 1945   I have seen and evaluated this patient and I have discussed the plan of care with the house staff. Please see Dr. Fredrik Cove note for complete details. I concur with her findings.  Discharge planned today or tomorrow depending on patient status and improvement.   Sid Falcon, MD 07/08/2017, 9:14 PM

## 2017-07-08 NOTE — Progress Notes (Signed)
ANTICOAGULATION CONSULT NOTE  Pharmacy Consult:  warfarin Indication: atrial fibrillation  Patient Measurements: Height: 5\' 4"  (162.6 cm) Weight: 279 lb 4.8 oz (126.7 kg)(scale b) IBW/kg (Calculated) : 54.7  Vital Signs: Temp: 98.5 F (36.9 C) (06/08 0500) Temp Source: Oral (06/08 0500) BP: 151/70 (06/08 0500) Pulse Rate: 70 (06/08 0500)  Labs: Recent Labs    07/06/17 0150 07/07/17 0535 07/07/17 0829 07/08/17 0656  HGB 13.6  --  13.7 13.2  HCT 41.7  --  42.3 42.6  PLT 455*  --  517* 520*  LABPROT 26.2* 23.1*  --  23.6*  INR 2.43 2.07  --  2.12  CREATININE 0.92  --  0.94 0.90    Estimated Creatinine Clearance: 75.6 mL/min (by C-G formula based on SCr of 0.9 mg/dL).     Assessment: 47 YOF here with productive cough and is being treated with azithromycin.  Pharmacy has been consulted to continue warfarin from PTA for history of AFib.  INR therapeutic on admit on home regimen and remains therapeutic today.  No bleeding reported.   Goal of Therapy:  INR 2-3 Monitor platelets by anticoagulation protocol: Yes    Plan:  Coumadin 5mg  PO daily except 7.5mg  on Wed, Fri and Sun Daily PT / INR Possible D/C today   Jalene Mullet, Pharm.D. PGY1 Pharmacy Resident 07/08/2017 9:57 AM Main Pharmacy: [phone]

## 2017-07-08 NOTE — Progress Notes (Signed)
Pt. Ambulated in hallway with standby assistance.  Patient stated she was fair, but still didn't feel well.  Oxygen saturation 92-95% on room air.

## 2017-07-08 NOTE — Progress Notes (Signed)
Called [phone] Pilar Plate RT per pt request to have Albuterol treatment.  He is not available, I will call again.

## 2017-07-09 DIAGNOSIS — R05 Cough: Secondary | ICD-10-CM | POA: Diagnosis not present

## 2017-07-09 DIAGNOSIS — J157 Pneumonia due to Mycoplasma pneumoniae: Secondary | ICD-10-CM | POA: Diagnosis not present

## 2017-07-09 LAB — CBC
HCT: 43.9 % (ref 36.0–46.0)
Hemoglobin: 13.9 g/dL (ref 12.0–15.0)
MCH: 29.1 pg (ref 26.0–34.0)
MCHC: 31.7 g/dL (ref 30.0–36.0)
MCV: 92 fL (ref 78.0–100.0)
Platelets: 555 K/uL — ABNORMAL HIGH (ref 150–400)
RBC: 4.77 MIL/uL (ref 3.87–5.11)
RDW: 15.5 % (ref 11.5–15.5)
WBC: 8.9 K/uL (ref 4.0–10.5)

## 2017-07-09 LAB — PROTIME-INR
INR: 1.97
PROTHROMBIN TIME: 22.3 s — AB (ref 11.4–15.2)

## 2017-07-09 LAB — BASIC METABOLIC PANEL WITH GFR
Anion gap: 8 (ref 5–15)
BUN: 11 mg/dL (ref 6–20)
CO2: 27 mmol/L (ref 22–32)
Calcium: 8.4 mg/dL — ABNORMAL LOW (ref 8.9–10.3)
Chloride: 103 mmol/L (ref 101–111)
Creatinine, Ser: 0.84 mg/dL (ref 0.44–1.00)
GFR calc Af Amer: >60 mL/min
GFR calc non Af Amer: >60 mL/min
Glucose, Bld: 100 mg/dL — ABNORMAL HIGH (ref 65–99)
Potassium: 4.7 mmol/L (ref 3.5–5.1)
Sodium: 138 mmol/L (ref 135–145)

## 2017-07-09 MED ORDER — GUAIFENESIN-DM 100-10 MG/5ML PO SYRP: 5.0000 mL | ORAL_SOLUTION | ORAL | 0 refills | Status: DC | PRN

## 2017-07-09 MED ORDER — WARFARIN SODIUM 7.5 MG PO TABS
7.5000 mg | ORAL_TABLET | Freq: Once | ORAL | Status: AC
  Administered 2017-07-09: 7.5 mg via ORAL
  Filled 2017-07-09: qty 1

## 2017-07-09 MED ORDER — IPRATROPIUM-ALBUTEROL 0.5-2.5 (3) MG/3ML IN SOLN: 3.0000 mL | Freq: Four times a day (QID) | RESPIRATORY_TRACT | 3 refills | Status: DC | PRN

## 2017-07-09 MED ORDER — BENZONATATE 200 MG PO CAPS: 200.0000 mg | ORAL_CAPSULE | Freq: Two times a day (BID) | ORAL | 0 refills | Status: DC

## 2017-07-09 NOTE — Discharge Summary (Signed)
Name: [name] MRN: [id number] DOB: 05-31-1945 72 y.o. PCP: Mariel Sleet  Date of Admission: 07/05/2017  6:11 PM Date of Discharge: 07/09/2017 Attending Physician: Annia Belt, MD  Discharge Diagnosis: Mycoplasma pneumonia Presyncope  Discharge Medications: Allergies as of 07/09/2017      Reactions   Ceftriaxone Rash, Other (See Comments)   "TTP"/Was hospitalized and in a coma for 60 days, per patient (Thrombotic Thrombocytopenic Purpura)   Penicillins Hives, Rash, Other (See Comments)   Cannot tolerate any "-CILLINS"; causes welts!! Has patient had a PCN reaction causing immediate rash, facial/tongue/throat swelling, SOB or lightheadedness with hypotension: Yes Has patient had a PCN reaction causing severe rash involving mucus membranes or skin necrosis: No Has patient had a PCN reaction that required hospitalization No Has patient had a PCN reaction occurring within the last 10 years: No If all of the above answers are "NO", then may proceed with Cephalosporin use.   Levofloxacin Other (See Comments)   Thrombocytopenia    Levofloxacin Other (See Comments)   Dropped blood platelets extremely low   Oxycodone Itching   Plavix [clopidogrel] Other (See Comments)   Causes severe leg cramps   Tape Other (See Comments)   Prefers paper or cloth tape   Alendronate Rash   Aspirin Other (See Comments)   Patient stated she is unable to take due to blood thinner   Oxycodone-acetaminophen Itching   Penicillin G Rash      Medication List    STOP taking these medications   metoprolol succinate 100 MG 24 hr tablet Commonly known as:  TOPROL-XL     TAKE these medications   benazepril 10 MG tablet Commonly known as:  LOTENSIN TAKE 1 TABLET BY MOUTH EVERY DAY   benzonatate 200 MG capsule Commonly known as:  TESSALON Take 1 capsule (200 mg total) by mouth 2 (two) times daily.   furosemide 20 MG tablet Commonly known as:  LASIX Take 1 tablet (20 mg  total) by mouth daily.   guaiFENesin-dextromethorphan 100-10 MG/5ML syrup Commonly known as:  ROBITUSSIN DM Take 5 mLs by mouth every 4 (four) hours as needed for cough.   ipratropium-albuterol 0.5-2.5 (3) MG/3ML Soln Commonly known as:  DUONEB Take 3 mLs by nebulization every 6 (six) hours as needed.   isosorbide mononitrate 30 MG 24 hr tablet Commonly known as:  IMDUR TAKE 1 TABLET BY MOUTH EVERY DAY   levothyroxine 50 MCG tablet Commonly known as:  SYNTHROID, LEVOTHROID Take 50 mcg by mouth daily before breakfast.   LORazepam 0.5 MG tablet Commonly known as:  ATIVAN Take 0.5 mg by mouth 2 (two) times daily as needed for anxiety.   meclizine 12.5 MG tablet Commonly known as:  ANTIVERT Take 12.5 mg by mouth 3 (three) times daily as needed for dizziness.   nitroGLYCERIN 0.4 MG SL tablet Commonly known as:  NITROSTAT Place 1 tablet (0.4 mg total) under the tongue every 5 (five) minutes as needed for chest pain.   PROLIA 60 MG/ML Sosy injection Generic drug:  denosumab Inject 60 mg into the skin every 6 (six) months.   sotalol 120 MG tablet Commonly known as:  BETAPACE TAKE 1 TABLET BY MOUTH TWICE A DAY   Vitamin D3 2000 units capsule Take 2,000 Units by mouth every morning.   warfarin 5 MG tablet Commonly known as:  COUMADIN Take as directed. If you are unsure how to take this medication, talk to your nurse or doctor. Original instructions:  TAKE AS DIRECTED  BY [facility]MADIN CLINIC What changed:  See the new instructions.            Durable Medical Equipment  (From admission, onward)        Start     Ordered   07/09/17 1414  For home use only DME Nebulizer machine  Once    Question:  Patient needs a nebulizer to treat with the following condition  Answer:  Reactive airway disease   07/09/17 1413     Disposition and follow-up:   [name] was discharged from [facility] in Stable condition.  At the hospital follow up visit please  address:  1.  Address need for ongoing DuoNebs. She does not have a known history of lung disease. She is being discharged on home DuoNebs per her request. If she continues to have an ongoing requirement, she may require outpatient referral for further workup (I.e. PFT's).   Plavix was held at the time of discharge because she is 2 years post drug eluting stent placement and also on warfarin.  Also, there have been rare cases of TTP associated with Plavix  2.  Labs / imaging needed at time of follow-up: None  3.  Pending labs/ test needing follow-up: None  Follow-up Appointments: Follow-up Information    [facility] Follow up.   Specialty:  Home Health Services Why:  Registered Nurse, Physical Therapy, Aide Contact information: [address] [address] [phone]           Hospital Course by problem list: Mycoplasma pneumonia She presented with a 1 week history of productive cough, subjective fevers, and chills. She had expiratory wheezes on pulmonary auscultation consistent with reactive changes secondary to mycoplasma infection. Chest X-ray showed some possible faint bilateral interstitial infiltrates. Respiratory viral panel was positive for mycoplasma pneumoniae. Procalcitonin was negative, consistent with atypical pneumonia. Sputum culture revealed oropharyngeal flora.  She received a single dose of cefepime in the ED. She received a 4-day course of azithromycin from 6/5 to 6/8. She remained afebrile throughout her hospitalization. On day of discharge, she continued to have inspiratory crackles and expiratory wheezes on lung exam, but remained hemodynamically stable, felt that she was breathing better, and expressed her desire to go home. PT recommended home health. She is being discharged with home DuoNebs at the request of the patient.  Light-headedness Head CT, electrolyte panel, EKG, and urinalysis are all within expected limits.  Arterial blood gas was within normal limits except for a PO2 of 69. She has had no further episodes of presyncope since admission. She did not have any cardiac events recorded on telemetry. Her presyncopal episodes were not temporally related to coughing. Her neurological exam was normal. Orthostatic vital signs taken 6/8 are consistent with appropriate blood pressure response. It seems most likely at this point that her "light headedness" was secondary to fatigue and malaise in the setting of pneumonia. She was on both metoprolol and sotalol prior to admission, sotalol was restarted during admission and HR trended 50-60s. Metoprolol was held at the time of discharge.   Heart failure with preserved ejection fraction She is currently compensated. She appears euvolemic on exam, given her lack of peripheral edema or jugular venous distention. She was continued on her home medication regimen.  Coronary artery disease status post drug eluting stent She has now been on Plavix for 2 years since her last cardiac event. She is also anticoagulated on warfarin. At this point, the bleeding risk associated  with staying on Plavix likely outweighs the cardiovascular benefit. Therefore, Plavix was discontinued.  Paroxysmal atrial fibrillation on warfarin Rate and rhythm are regular. EKG did not show atrial fibrillation. INR was monitored while she was an inpatient, and warfarin was dosed accordingly per pharmacy. She was continued on her home sotalol.  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  Hypertension She remained moderately hypertensive throughout her hospitalization, with blood pressure ranging from 129/61 to 180/71. She was continued on her home medication regimen, and was not given any PRN's due to recent history of light-headedness. She did refuse her home medications the day of discharge.  Hypothyroidism Continued home Synthroid 50 mcg  QD  Discharge Vitals:   BP (!) 185/94 (BP Location: Right Arm)   Pulse 62   Temp 98 F (36.7 C) (Oral)   Resp 20   Ht 5\' 4"  (1.626 m)   Wt 278 lb (126.1 kg) Comment: scale b  SpO2 96%   BMI 47.72 kg/m   Pertinent Labs, Studies, and Procedures:  CBC Latest Ref Rng & Units 07/09/2017 07/08/2017 07/07/2017  WBC 4.0 - 10.5 K/uL 8.9 9.3 9.5  Hemoglobin 12.0 - 15.0 g/dL 13.9 13.2 13.7  Hematocrit 36.0 - 46.0 % 43.9 42.6 42.3  Platelets 150 - 400 K/uL 555(H) 520(H) 517(H)   BMP Latest Ref Rng & Units 07/09/2017 07/08/2017 07/07/2017  Glucose 65 - 99 mg/dL 100(H) 105(H) 121(H)  BUN 6 - 20 mg/dL 11 11 11   Creatinine 0.44 - 1.00 mg/dL 0.84 0.90 0.94  BUN/Creat Ratio 12 - 28 - - -  Sodium 135 - 145 mmol/L 138 139 139  Potassium 3.5 - 5.1 mmol/L 4.7 4.7 3.9  Chloride 101 - 111 mmol/L 103 103 106  CO2 22 - 32 mmol/L 27 26 27   Calcium 8.9 - 10.3 mg/dL 8.4(L) 8.2(L) 8.2(L)   Procalcitonin <0.10 Respiratory viral panel positive for mycoplasma pneumoniae TSH 3.2  CXR: Cardiomegaly, vascular congestion.  Head CT: 1.  No acute intracranial abnormality. 2.  Mild chronic small vessel ischemia with small remote lacunar infarcts in the right basal ganglia and left caudate.  Sputum culture Consistent with normal respiratory flora.  Urinalysis    Component Value Date/Time   COLORURINE YELLOW 07/05/2017 2222   APPEARANCEUR CLEAR 07/05/2017 2222   LABSPEC 1.013 07/05/2017 2222   PHURINE 5.0 07/05/2017 2222   GLUCOSEU NEGATIVE 07/05/2017 2222   HGBUR NEGATIVE 07/05/2017 2222   BILIRUBINUR NEGATIVE 07/05/2017 2222   KETONESUR 5 (A) 07/05/2017 2222   PROTEINUR NEGATIVE 07/05/2017 2222   UROBILINOGEN 0.2 10/17/2008 0024   NITRITE NEGATIVE 07/05/2017 2222   LEUKOCYTESUR MODERATE (A) 07/05/2017 2222   Discharge Instructions: Discharge Instructions    Call MD for:  difficulty breathing, headache or visual disturbances   Complete by:  As directed    Call MD for:  extreme fatigue   Complete by:   As directed    Call MD for:  persistant dizziness or light-headedness   Complete by:  As directed    Diet - low sodium heart healthy   Complete by:  As directed    Increase activity slowly   Complete by:  As directed     You were hospitalized for pneumonia, an infection of your lungs. We treated you with 4 days of antibiotics. We are glad you are feeling a little better and will be able to go home. If you do start feeling worse, experience incresed light-headedness, dizziness, or shortness of breath, you should  come back to the hospital. If not, we will look forward to seeing you at your follow-up clinic visit listed below. It has been a pleasure taking care of you, and we wish you all the best!  Signed: Orlene Erm, MS 4  07/09/2017, 2:21 PM   Pager: [phone]  Attestation for Student Documentation:  I personally was present and performed or re-performed the history, physical exam and medical decision-making activities of this service and have verified that the service and findings are accurately documented in the student's note.  Ledell Noss, MD 07/09/2017, 2:24 PM

## 2017-07-09 NOTE — Plan of Care (Signed)
  Problem: Health Behavior/Discharge Planning: Goal: Ability to manage health-related needs will improve Outcome: Progressing   Problem: Clinical Measurements: Goal: Ability to maintain clinical measurements within normal limits will improve Outcome: Progressing   Problem: Activity: Goal: Risk for activity intolerance will decrease Outcome: Progressing   

## 2017-07-09 NOTE — Progress Notes (Signed)
  Date: 07/09/2017  Patient name: [name]  Medical record number: [id number]  Date of birth: 05-17-1945   This patient's plan of care was discussed with the house staff. Please see Dr. Fredrik Cove note for complete details. I concur with their findings.   Sid Falcon, MD 07/09/2017, 7:11 PM

## 2017-07-09 NOTE — Progress Notes (Signed)
Patient says she isn't taking any medications because she is going home, says she wil take her medicines at home. Her BP is elevated this am and RN tried to get a new cuff to take BP again, patient states "they've been using this same cuff the whole time". Will continue to monitor. Hopefully she will takes morning meds to help bring her BP down.

## 2017-07-09 NOTE — Progress Notes (Addendum)
Subjective: [name] feels "a little better" this morning. She was able to sit up and eat breakfast. She continues to complain of significant fatigue, and generalized weakness, and light-headedness whenever she sits or stands, but these are improving. She continues to complain of bothersome cough. She is not dyspneic at rest, but does become slightly dyspneic when she walks.  She refused her morning antihypertensive medications, saying that she is ready to go home, and she will take her home medications when she gets home.  Physical therapy evaluated her yesterday and recommended home health. Referral has been sent. Case management is aware.  Objective: Vital signs in last 24 hours: Vitals:   07/08/17 2007 07/09/17 0408 07/09/17 0645 07/09/17 0757  BP: (!) 149/74 (!) 180/71 (!) 176/78   Pulse: (!) 52 65    Resp: 18 20    Temp: 98.1 F (36.7 C) 97.8 F (36.6 C)    TempSrc: Oral Oral    SpO2: 96% 94%  95%  Weight:  126.1 kg (278 lb)    Height:       Weight change: -0.59 kg (-1 lb 4.8 oz)  Intake/Output Summary (Last 24 hours) at 07/09/2017 1007 Last data filed at 07/09/2017 6834 Gross per 24 hour  Intake 1200 ml  Output 1800 ml  Net -600 ml   General appearance: alert, cooperative, appears stated age, no distress and morbidly obese Head: Normocephalic, without obvious abnormality, atraumatic Lungs: wheezes mild inspiratory wheezes, prominant expiratory wheezes and course crackles heard in all lung fields Heart: regular rate and rhythm, S1, S2 normal, no murmur, click, rub or gallop Abdomen: soft, non-tender; bowel sounds normal; no masses,  no organomegaly Extremities: extremities normal, atraumatic, no cyanosis or edema Pulses: 2+ and symmetric Neurologic: Grossly normal  Assessment/Plan:  Principal Problem:   Mycoplasma pneumonia Active Problems:   TTP (thrombotic thrombocytopenic purpura) (HCC)   Benign essential HTN   Chronic anticoagulation   Persistent atrial  fibrillation (HCC)   CAD (coronary artery disease)   Hypothyroidism   Bronchitis   Postural dizziness with presyncope   History of splenectomy   History of TTP (thrombotic thrombocytopenic purpura)   PAF (paroxysmal atrial fibrillation) (HCC)  Mycoplasma pneumonia Respiratory viral panel was positive for mycoplasma pneumoniae. A 1 week history of productive cough, subjective fevers, and chills and a chest X-ray showing some possible faint bilateral interstitial infiltrates are consistent with this diagnosis. Negative procalcitonin is also consistent with an atypical pneumonia. Sputum culture is pending. Expiratory wheezes heard on pulmonary auscultation are likely reactive changes secondary to mycoplasma infection. - Azithromycin 250 mg QD 6/5 >> 6/8  - Received single dose of cefepime in the ED - Tylenol PRN for fever - Duo-Nebs q6h while awake  - Robitussin PRN for cough - Blood cultures show no growth at 3 days  - Sputum culture shows normal respiratory flora  Presyncope Head CT, electrolyte panel, EKG, and urinalysis are all within expected limits. Arterial blood gas was within normal limits except for a PO2 of 69. She has had no further episodes of presyncope since admission. She has not had any cardiac events recorded on telemetry. Her presyncopal episodes are not temporally related to coughing. Her neurological exam is normal. Orthostatic vital signs taken today are consistent with appropriate blood pressure response. It seems most likely at this point that her "light headedness" was secondary to fatigue and malaise in the setting of pneumonia. - PT recommending home health. Case management aware  Heart failure with preserved ejection fraction  She is currently compensated. She appears euvolemic on exam, given her lack of peripheral edema or jugular venous distention.  - Imdur 30 mg QD - Benazepril 10 mg QD - Sotalol 120 mg BID - Lasix 20 mg QD  Coronary artery disease status post  drug eluting stent She has now been on Plavix for 2 years since her last cardiac event. She is also anticoagulated on warfarin. At this point, the bleeding risk associated with staying on Plavix likely outweighs the cardiovascular benefit. - Plavix stopped 6/7 - Sotalol 120 mg BID  - Benazepril 10 mg QD  Paroxysmal atrial fibrillation on warfarin Rate and rhythm are regular. 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  Hypertension She has remained moderately hypertensive throughout the day today, with BP ranging from 129/61 to 136/59. Will continue home medication regimen at this time given recent history of presyncope. - Imdur 30 mg QD - Benazepril 10 mg QD - Sotalol 120 mg BID  Hypothyroidism - Synthroid 50 mcg QD  DVT ppx: on warfarin Dispo: Discharge today with home health Code status: Full code  Doristine Johns, Medical Student 07/09/2017, 10:07 AM   Attestation for Student Documentation:  I personally was present and performed or re-performed the history, physical exam and medical decision-making activities of this service and have verified that the service and findings are accurately documented in the student's note.  Ledell Noss, MD 07/09/2017, 2:17 PM

## 2017-07-09 NOTE — Care Management Note (Signed)
Case Management Note Previous CM note completed by Bethena Roys, RN 07/08/2017, 4:06 PM   Patient Details  Name: [name] MRN: [id number] Date of Birth: 07/11/1945  Subjective/Objective: Pt presented for Cough and new diagnosis of Pneumonia. PTA Independent from home. Daughter at the bedside. Plan will be to transition home 07-09-17. Pt states she has a RW however the halls are to narrow to use in the home.                    Action/Plan: CM did talk to patient and daughter in regards to disposition needs. Patient states family will be out of town for a wedding and she feels that she is deconditioned and needs additional help. CM did ask for PT consult. Staff to ambulate and see if patient qualifies for home 02. Pt states she gets SOB with activity. CM did discuss [facility] with the patient and daughter. Pt states that [facility] RN, PT, Aide will be beneficial. Pt will need orders for services. Staff RN to see if needs 02- pt wants to use [facility] for [place]- has used in the past. If needs DME will utilize [facility] as well. Referral given to [facility] for Services. Plan will be for home on 07-09-17. No further needs from CM at this time.   Expected Discharge Date:  07/09/17               Expected Discharge Plan:  [place]  In-House Referral:  NA  Discharge planning Services  CM Consult  Post Acute Care Choice:  Home Health, Durable Medical Equipment Choice offered to:  Patient  DME Arranged:  Nebulizer machine DME Agency:  [place] Arranged:  RN, Disease Management, PT, Nurse's Aide [place] Agency:  [place]  Status of Service:  Completed, signed off  If discussed at [place] of Stay Meetings, dates discussed:    Discharge Disposition: home/home health   Additional Comments:  07/09/17- 1430 - Arlesia Kiel RN, CM- pt for transition home today- per MD will need nebulizer for home- order has been placed along with HHRN/PT/aide  orders- referral has already been given to [facility]- notified Jermaine with [facility] orders for discharge today- and also DME need- nebulizer to be delivered to room prior to discharge- pt did not qualify for home 02.   Dawayne Patricia, RN 07/09/2017, 2:34 PM 3E weekend coverage [phone]

## 2017-07-09 NOTE — Progress Notes (Signed)
Pt ready for discharge to home with family here to provided transportation. All discharge instructions reviewed with pt. Meds, Follow up appts, precautions to take to avoid future hospitalizations such as daily weights. Pt demonstrates no s/sx of discomfort at this time. All personal belongings with patient.

## 2017-07-09 NOTE — Progress Notes (Signed)
ANTICOAGULATION CONSULT NOTE  Pharmacy Consult:  warfarin Indication: atrial fibrillation  Patient Measurements: Height: 5\' 4"  (162.6 cm) Weight: 278 lb (126.1 kg)(scale b) IBW/kg (Calculated) : 54.7  Vital Signs: Temp: 97.8 F (36.6 C) (06/09 0408) Temp Source: Oral (06/09 0408) BP: 176/78 (06/09 0645) Pulse Rate: 65 (06/09 0408)  Labs: Recent Labs    07/07/17 0535  07/07/17 0829 07/08/17 0656 07/09/17 0706  HGB  --    < > 13.7 13.2 13.9  HCT  --   --  42.3 42.6 43.9  PLT  --   --  517* 520* 555*  LABPROT 23.1*  --   --  23.6* 22.3*  INR 2.07  --   --  2.12 1.97  CREATININE  --   --  0.94 0.90 0.84   < > = values in this interval not displayed.    Estimated Creatinine Clearance: 80.8 mL/min (by C-G formula based on SCr of 0.84 mg/dL).     Assessment: 39 YOF with warfarin PTA for history of AFib. INR this morning at 1.96, slightly below goal. Plan is for patient to discharge this afternoon, will move up warfarin dosing so patient can take before discharge. Would continue patient on home regimen dosing of Warfarin 5mg  PO daily except 7.5mg  on Wed, Fri and Sun.   Goal of Therapy:  INR 2-3 Monitor platelets by anticoagulation protocol: Yes   Plan:  Warfarin 7.5mg  PO x 1 this afternoon  D/C today   Jalene Mullet, Pharm.D. PGY1 Pharmacy Resident 07/09/2017 11:47 AM Main Pharmacy: [phone]

## 2017-07-10 ENCOUNTER — Telehealth: Payer: Self-pay | Admitting: *Deleted

## 2017-07-10 LAB — CULTURE, BLOOD (ROUTINE X 2)
Culture: NO GROWTH
Culture: NO GROWTH

## 2017-07-10 NOTE — Telephone Encounter (Signed)
Received fax from [place] - stating pt picked up neb solution but does not have a nebulizer. "Pt distraught and threatened to drink nebs! Calmed her down and told her not to do so".  Upon reading d/c note; nebulizer was to be delivered to pt's room prior to d/c. Called and talked to pt - stated she did not get nebulizer. Then she started talking about "the bad service" and how she was treated poorly.  I called Marvetta Gibbons, CM [phone]) - stated neb was supposed to delivered to her room prior to d/c but she must had left before this was done. But she will call [facility] and have it mailed to her home.

## 2017-07-12 ENCOUNTER — Ambulatory Visit (INDEPENDENT_AMBULATORY_CARE_PROVIDER_SITE_OTHER): Payer: Medicare Other | Admitting: *Deleted

## 2017-07-12 DIAGNOSIS — I480 Paroxysmal atrial fibrillation: Secondary | ICD-10-CM | POA: Diagnosis not present

## 2017-07-12 DIAGNOSIS — I481 Persistent atrial fibrillation: Secondary | ICD-10-CM | POA: Diagnosis not present

## 2017-07-12 DIAGNOSIS — I4819 Other persistent atrial fibrillation: Secondary | ICD-10-CM

## 2017-07-12 DIAGNOSIS — Z5181 Encounter for therapeutic drug level monitoring: Secondary | ICD-10-CM

## 2017-07-12 LAB — POCT INR: INR: 2.1 (ref 2.0–3.0)

## 2017-07-12 NOTE — Patient Instructions (Signed)
Description   Continue same dose Coumadin 5mg  (1 tablet) daily except 7.5 mg (1.5 tablets) on Sundays, Wednesdays and Fridays. Recheck INR 3 weeks. Call us with any new medications or concerns [phone].

## 2017-07-14 ENCOUNTER — Telehealth: Payer: Self-pay | Admitting: Cardiology

## 2017-07-14 NOTE — Telephone Encounter (Signed)
I was paged for a call from the [place] home physical therapist, Herbert Deaner, who is working with the patient. He says that she is feeling more weak today. She has a productive cough for the last 3 weeks (was  Hospitalized with pneumonia) which is improving but still present. BP 180/90, HR 52. Pt denies chest discomfort. Her breathing is at baseline, always a little short. Her edema is at baseline. She states that she knows that she is not holding extra fluid as she knows what to look for. She does not think that she needs to go to the hospital.  She has an appointment for Thursday with her PCP. I advised her to call on Monday to try to get seen sooner regarding her continued prod cough. Also advised that if she has shortness of breath to go to the hospital. I will send this note to our triage in case the patient calls the office on Monday.

## 2017-07-19 NOTE — Telephone Encounter (Signed)
Thank you for update. Lesha Jager, MD  

## 2017-07-20 DIAGNOSIS — Z862 Personal history of diseases of the blood and blood-forming organs and certain disorders involving the immune mechanism: Secondary | ICD-10-CM

## 2017-07-20 DIAGNOSIS — Z9081 Acquired absence of spleen: Secondary | ICD-10-CM

## 2017-07-24 ENCOUNTER — Telehealth: Payer: Self-pay | Admitting: Cardiovascular Disease

## 2017-07-24 NOTE — Telephone Encounter (Signed)
New Message    Pt c/o BP issue:  1. What are your last 5 BP readings? 170/100 2. Are you having any other symptoms (ex. Dizziness, headache, blurred vision, passed out)? No symptoms 3. What is your medication issue?   BP is elevated per Clair Gulling with [facility]. He states that the patient advises she just feels unwell. But no SOB, dizziness or nausea. The patient also stated that she has not slept well.

## 2017-07-24 NOTE — Telephone Encounter (Signed)
Agree with PCP management of BP.  Thanks [name], MD

## 2017-07-24 NOTE — Telephone Encounter (Signed)
Pt states she just doesn't feel good, didn't sleep well last night and sometimes her BP is just high.  She reports she has a f/u with her PCP on Wednesday and will see them above her BP and feeling poor.  She was recently in the hospital with pneumonia and states she is still getting over that.  She also reports she does not see Dr Marlou Porch anymore and only sees Dr Curt Bears.  Dr Marlou Porch has not seen pt since 2017.  I will forward this information to Drs Curt Bears and [place] for their knowledge.

## 2017-07-30 ENCOUNTER — Other Ambulatory Visit: Payer: Self-pay | Admitting: Cardiology

## 2017-08-01 ENCOUNTER — Ambulatory Visit (INDEPENDENT_AMBULATORY_CARE_PROVIDER_SITE_OTHER): Payer: Medicare Other | Admitting: Pharmacist

## 2017-08-01 DIAGNOSIS — Z5181 Encounter for therapeutic drug level monitoring: Secondary | ICD-10-CM

## 2017-08-01 DIAGNOSIS — I481 Persistent atrial fibrillation: Secondary | ICD-10-CM | POA: Diagnosis not present

## 2017-08-01 DIAGNOSIS — I4819 Other persistent atrial fibrillation: Secondary | ICD-10-CM

## 2017-08-01 LAB — POCT INR: INR: 2.4 (ref 2.0–3.0)

## 2017-08-01 NOTE — Patient Instructions (Signed)
Description   Continue same dose Coumadin 5mg (1 tablet) daily except 7.5 mg (1.5 tablets) on Sundays, Wednesdays and Fridays. Recheck INR 4 weeks. Call us with any new medications or concerns #[phone].     

## 2017-08-10 ENCOUNTER — Other Ambulatory Visit: Payer: Self-pay | Admitting: Cardiology

## 2017-08-29 ENCOUNTER — Ambulatory Visit (INDEPENDENT_AMBULATORY_CARE_PROVIDER_SITE_OTHER): Payer: Medicare Other

## 2017-08-29 DIAGNOSIS — Z5181 Encounter for therapeutic drug level monitoring: Secondary | ICD-10-CM | POA: Diagnosis not present

## 2017-08-29 DIAGNOSIS — I481 Persistent atrial fibrillation: Secondary | ICD-10-CM | POA: Diagnosis not present

## 2017-08-29 DIAGNOSIS — I4819 Other persistent atrial fibrillation: Secondary | ICD-10-CM

## 2017-08-29 LAB — POCT INR: INR: 2.2 (ref 2.0–3.0)

## 2017-08-29 NOTE — Patient Instructions (Signed)
Description   Continue same dose Coumadin 5mg (1 tablet) daily except 7.5 mg (1.5 tablets) on Sundays, Wednesdays and Fridays. Recheck INR 6 weeks. Call us with any new medications or concerns #[phone].     

## 2017-09-07 ENCOUNTER — Other Ambulatory Visit: Payer: Self-pay | Admitting: Cardiology

## 2017-09-08 ENCOUNTER — Other Ambulatory Visit: Payer: Self-pay

## 2017-09-08 MED ORDER — SOTALOL HCL 120 MG PO TABS: 120.0000 mg | ORAL_TABLET | Freq: Two times a day (BID) | ORAL | 7 refills | Status: DC

## 2017-09-26 ENCOUNTER — Telehealth: Payer: Self-pay | Admitting: Cardiology

## 2017-09-26 NOTE — Telephone Encounter (Signed)
OK, but I have not seen her  I would recommend Dr Elvis Coil, she would need to call and make an appt

## 2017-09-26 NOTE — Telephone Encounter (Signed)
New Message:      Pt states she would like to switch from Dr. Curt Bears to one of the Dr. In the [place] office for convenience  reasons. Pt states that is nothing personal.

## 2017-09-27 ENCOUNTER — Telehealth: Payer: Self-pay | Admitting: *Deleted

## 2017-09-27 NOTE — Telephone Encounter (Signed)
Pt called and asked if she could have her Coumadin checked in [facility] if she is able to go see Dr. Curt Bears. Instructed pt that we do not have a Coumadin Clinic in [facility] and she will need to continue to come to [facility] location for INR Checks/Coumadin Dosing. Pt was given the number to call to speak with the Operators because she need to find out if she could get some info about changing her appt.

## 2017-09-27 NOTE — Telephone Encounter (Signed)
Left message for patient to return call so I can get clarification on this.

## 2017-09-27 NOTE — Telephone Encounter (Signed)
Informed patient that Dr. Curt Bears sees patients in [place]. Patient plans to call and have her next appointment scheduled there. She would also like to discuss her plavix and coumadin at her appointment, specifically if she needs to take both of them or not.

## 2017-09-27 NOTE — Telephone Encounter (Signed)
Please inform the patient that I am still in [facility] once per month and would be happy to see her. If she wishes to have someone there more often, no issues with switching.

## 2017-10-10 ENCOUNTER — Ambulatory Visit (INDEPENDENT_AMBULATORY_CARE_PROVIDER_SITE_OTHER): Payer: Medicare Other | Admitting: *Deleted

## 2017-10-10 DIAGNOSIS — Z5181 Encounter for therapeutic drug level monitoring: Secondary | ICD-10-CM | POA: Diagnosis not present

## 2017-10-10 DIAGNOSIS — I481 Persistent atrial fibrillation: Secondary | ICD-10-CM

## 2017-10-10 DIAGNOSIS — I4819 Other persistent atrial fibrillation: Secondary | ICD-10-CM

## 2017-10-10 LAB — POCT INR: INR: 1.8 — AB (ref 2.0–3.0)

## 2017-10-10 NOTE — Patient Instructions (Signed)
Description   Today take 1.5 tablets, then Continue same dose Coumadin 5mg  (1 tablet) daily except 7.5 mg (1.5 tablets) on Sundays, Wednesdays and Fridays. Recheck INR 2 weeks. Call us with any new medications or concerns [phone].

## 2017-10-16 DIAGNOSIS — R05 Cough: Secondary | ICD-10-CM | POA: Insufficient documentation

## 2017-10-16 DIAGNOSIS — R059 Cough, unspecified: Secondary | ICD-10-CM | POA: Insufficient documentation

## 2017-10-18 ENCOUNTER — Emergency Department (HOSPITAL_BASED_OUTPATIENT_CLINIC_OR_DEPARTMENT_OTHER)
Admission: EM | Admit: 2017-10-18 | Discharge: 2017-10-18 | Disposition: A | Discharge status: Home / Self Care | Payer: Medicare Other | Attending: Emergency Medicine | Admitting: Emergency Medicine

## 2017-10-18 ENCOUNTER — Emergency Department (HOSPITAL_BASED_OUTPATIENT_CLINIC_OR_DEPARTMENT_OTHER): Payer: Medicare Other

## 2017-10-18 ENCOUNTER — Other Ambulatory Visit: Payer: Self-pay

## 2017-10-18 ENCOUNTER — Encounter (HOSPITAL_BASED_OUTPATIENT_CLINIC_OR_DEPARTMENT_OTHER): Payer: Self-pay | Admitting: Emergency Medicine

## 2017-10-18 DIAGNOSIS — Z955 Presence of coronary angioplasty implant and graft: Secondary | ICD-10-CM | POA: Diagnosis not present

## 2017-10-18 DIAGNOSIS — E039 Hypothyroidism, unspecified: Secondary | ICD-10-CM | POA: Diagnosis not present

## 2017-10-18 DIAGNOSIS — B9789 Other viral agents as the cause of diseases classified elsewhere: Secondary | ICD-10-CM | POA: Insufficient documentation

## 2017-10-18 DIAGNOSIS — I5022 Chronic systolic (congestive) heart failure: Secondary | ICD-10-CM | POA: Diagnosis not present

## 2017-10-18 DIAGNOSIS — J069 Acute upper respiratory infection, unspecified: Secondary | ICD-10-CM | POA: Diagnosis not present

## 2017-10-18 DIAGNOSIS — I2510 Atherosclerotic heart disease of native coronary artery without angina pectoris: Secondary | ICD-10-CM | POA: Diagnosis not present

## 2017-10-18 DIAGNOSIS — Z79899 Other long term (current) drug therapy: Secondary | ICD-10-CM | POA: Insufficient documentation

## 2017-10-18 DIAGNOSIS — R05 Cough: Secondary | ICD-10-CM | POA: Diagnosis present

## 2017-10-18 DIAGNOSIS — I110 Hypertensive heart disease with heart failure: Secondary | ICD-10-CM | POA: Diagnosis not present

## 2017-10-18 LAB — BASIC METABOLIC PANEL
Anion gap: 6 (ref 5–15)
BUN: 13 mg/dL (ref 8–23)
CALCIUM: 7.4 mg/dL — AB (ref 8.9–10.3)
CO2: 27 mmol/L (ref 22–32)
CREATININE: 0.81 mg/dL (ref 0.44–1.00)
Chloride: 106 mmol/L (ref 98–111)
GFR calc Af Amer: >60 mL/min (ref >60)
GLUCOSE: 101 mg/dL — AB (ref 70–99)
Potassium: 3.2 mmol/L — ABNORMAL LOW (ref 3.5–5.1)
Sodium: 139 mmol/L (ref 135–145)

## 2017-10-18 LAB — CBC WITH DIFFERENTIAL/PLATELET
Basophils Absolute: 0.1 10*3/uL (ref 0.0–0.1)
Basophils Relative: 1 %
EOS PCT: 1 %
Eosinophils Absolute: 0.1 10*3/uL (ref 0.0–0.7)
HCT: 44.8 % (ref 36.0–46.0)
Hemoglobin: 15 g/dL (ref 12.0–15.0)
LYMPHS ABS: 3 10*3/uL (ref 0.7–4.0)
LYMPHS PCT: 38 %
MCH: 30.4 pg (ref 26.0–34.0)
MCHC: 33.5 g/dL (ref 30.0–36.0)
MCV: 90.9 fL (ref 78.0–100.0)
Monocytes Absolute: 1.1 10*3/uL — ABNORMAL HIGH (ref 0.1–1.0)
Monocytes Relative: 13 %
Neutro Abs: 3.8 10*3/uL (ref 1.7–7.7)
Neutrophils Relative %: 47 %
PLATELETS: 522 10*3/uL — AB (ref 150–400)
RBC: 4.93 MIL/uL (ref 3.87–5.11)
RDW: 17.1 % — ABNORMAL HIGH (ref 11.5–15.5)
WBC: 8 10*3/uL (ref 4.0–10.5)

## 2017-10-18 NOTE — ED Notes (Signed)
Ambulated on r/a no increased WOB, HR 85-90, Spo2 95% or >

## 2017-10-18 NOTE — ED Provider Notes (Signed)
[facility] EMERGENCY DEPARTMENT Provider Note   CSN: [id number] Arrival date & time: 10/18/17  3335     History   Chief Complaint Chief Complaint  Patient presents with  . Shortness of Breath    HPI [name] is a 72 y.o. female.  72 yo F with a chief complaint of cough and congestion.  This been going on for the past week.  She was seen by her family doctor a few days ago and was diagnosed with pneumonia clinically and started on azithromycin and prednisone.  Chest x-ray at that time was negative for pneumonia.  She was offered admission based on how she felt and she declined it at that time.  Since then the patient says felt transiently better but then last night had trouble sleeping because of how much coughing she was having.  Denies fevers.  Feels just drained.  The history is provided by the patient.  Shortness of Breath  This is a new problem. The average episode lasts 1 week. The problem occurs continuously.The current episode started more than 1 week ago. The problem has been gradually worsening. Associated symptoms include cough. Pertinent negatives include no fever, no headaches, no rhinorrhea, no wheezing, no chest pain and no vomiting. She has tried oral steroids for the symptoms. The treatment provided no relief. She has had no prior hospitalizations. She has had no prior ED visits. Associated medical issues include COPD.    Past Medical History:  Diagnosis Date  . CAD (coronary artery disease)    a. LHC 3/17: mLAD 80, D1 50, pRCA 30, EF 25-35%; PCI: 3.5 x 16 mm Synergy DES to mLAD  . Chronic systolic CHF (congestive heart failure) ([place])    a. Echo 12/16: Mild concentric LVH, EF 25-30%, anteroseptal akinesis, inferoseptal akinesis, anterior akinesis, trivial AI, mild MR, moderate LAE, trivial TR, trivial PI, PASP 33 mmHg  //  b. Echo 2/17: Mild LVH, EF 25-30%, diffuse HK, mild LAE  //  c. Echo 7/17: mild LVH, EF 50-55%, no RWMA, trivial AI, mild to mod  LAE, mild RAE  . Febrile seizure ([place]) 2004   "during TTP thing; cause my temp was 106"  . High cholesterol   . History of blood transfusion 2004   "several; related to TTP"  . Hypertension   . Hypothyroidism   . Ischemic cardiomyopathy   . Morbid obesity ([place]) 01/07/2013  . Osteoarthritis of back   . Paroxysmal atrial fibrillation ([place]) 08/01/2011   a. started on Sotalol during 10/2015 admission (Tikosyn too expensive)  . Pneumonia ?1999  . Scoliosis   . Thyroid nodule    "grew back after 2 partial thyroid surgeries" (09/29/2015)  . 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 Mycoplasma pneumonia 07/06/2017  . Postural dizziness with presyncope   . History of splenectomy   . History of TTP (thrombotic thrombocytopenic purpura)   . PAF (paroxysmal atrial fibrillation) ([place])   . Bronchitis 07/05/2017  . CHF exacerbation ([place]) 09/23/2016  . Hyperglycemia 09/23/2016  . Hypothyroidism 09/23/2016  . Hyperlipidemia 08/06/2015  . Persistent atrial fibrillation ([place]) 04/18/2015  . CAD (coronary artery disease) 04/18/2015  . Cardiomyopathy, ischemic 04/18/2015  . Hypertensive heart disease with heart failure ([place]) 04/15/2015  . Anxiety 04/15/2015  . Chronic anticoagulation 04/15/2015  . Encounter for therapeutic drug monitoring 01/21/2015  . Dyspnea on exertion 01/05/2015  . Morbid obesity ([place]) 01/07/2013  . Benign essential HTN  08/01/2011  . 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 ABDOMINAL EXPLORATION SURGERY     "opened me up 2-3 times for blocked intestines"  . APPENDECTOMY    . BACK SURGERY    . BASAL CELL CARCINOMA EXCISION Right    "neck"  . CARDIAC CATHETERIZATION N/A 04/17/2015   Procedure: Left Heart Cath and Coronary Angiography;  Surgeon: Jettie Booze, MD;  Location: [facility] CV LAB;  Service: Cardiovascular;   Laterality: N/A;  . CARDIAC CATHETERIZATION  04/17/2015   Procedure: Coronary Stent Intervention;  Surgeon: Jettie Booze, MD;  Location: [facility] CV [facility];  Service: Cardiovascular;;  . CARDIOVERSION  06/2002; 03/2005; 05/2005   Archie Endo 06/16/2010  . CARDIOVERSION N/A 10/03/2015   Procedure: CARDIOVERSION;  Surgeon: Deboraha Sprang, MD;  Location: [facility];  Service: Cardiovascular;  Laterality: N/A;  . CATARACT EXTRACTION W/ INTRAOCULAR LENS  IMPLANT, BILATERAL Bilateral   . CHOLECYSTECTOMY OPEN    . CORONARY ANGIOPLASTY    . DILATION AND CURETTAGE OF UTERUS     S/P "miscarriage"  . FOOT SURGERY Right    "for nerve damage"  . SALIVARY STONE REMOVAL Left    "had tumor wrapped around it"  . SPLENECTOMY, TOTAL     Archie Endo 06/16/2010  . TEE WITHOUT CARDIOVERSION N/A 09/30/2015   Procedure: TRANSESOPHAGEAL ECHOCARDIOGRAM (TEE);  Surgeon: Skeet Latch, MD;  Location: [facility];  Service: Cardiovascular;  Laterality: N/A;  . THORACIC DISCECTOMY  05/2002  . THYROIDECTOMY, PARTIAL  X 2   "grew back"  . TONSILLECTOMY    . TUBAL LIGATION    . TUMOR EXCISION     "benign tumor in my neck"  . VAGINAL HYSTERECTOMY       OB History   None      Home Medications    Prior to Admission medications   Medication Sig Start Date End Date Taking? Authorizing Provider  azithromycin (ZITHROMAX) 250 MG tablet Take two tablets on the first day and then one tablet every day after. 10/16/17  Yes [provider]  benazepril (LOTENSIN) 10 MG tablet TAKE 1 TABLET BY MOUTH EVERY DAY 05/08/15  Yes [provider]  clopidogrel (PLAVIX) 75 MG tablet Take 1 tablet (75 mg total) by mouth daily with breakfast. Please make yearly appt with Dr. Curt Bears for April. 1st attempt 04/18/15  Yes [provider]  furosemide (LASIX) 20 MG tablet 1 TAB BY MOUTH DAILY. PLEASE MAKE YEARLY APPT WITH DR. Curt Bears FOR APRIL BEFORE ANYMORE REFILLS. 04/26/17  Yes [provider]  isosorbide  mononitrate (IMDUR) 30 MG 24 hr tablet TAKE ONE TABLET BY MOUTH DAILY 04/10/15  Yes [provider]  levothyroxine (SYNTHROID, LEVOTHROID) 50 MCG tablet Take 50 mcg by mouth daily before breakfast.   Yes [provider]  metoprolol succinate (TOPROL-XL) 100 MG 24 hr tablet TAKE 1 TABLET BY MOUTH EVERY MORNING PLUS 1/2 TABLET EVERY EVENING 01/07/16  Yes [provider]  predniSONE (DELTASONE) 20 MG tablet Take by mouth. 10/16/17 10/23/17 Yes [provider]  sotalol (BETAPACE) 120 MG tablet TAKE 1 TABLET BY MOUTH TWICE A DAY 11/30/15  Yes [provider]  warfarin (COUMADIN) 5 MG tablet TAKE AS DIRECTED BY COUMADIN CLINIC 08/10/17  Yes Jerline Pain, MD  benazepril (LOTENSIN) 10 MG tablet TAKE 1 TABLET BY MOUTH EVERY DAY 05/02/17   Jerline Pain, MD  benzonatate (TESSALON) 200 MG capsule Take 1 capsule (200 mg total)  by mouth 2 (two) times daily. 07/09/17   Ledell Noss, MD  Cholecalciferol (VITAMIN D3) 2000 units capsule Take 2,000 Units by mouth every morning.    [provider]  clopidogrel (PLAVIX) 75 MG tablet Take 1 tablet (75 mg total) by mouth daily. 09/07/17   Jerline Pain, MD  denosumab (PROLIA) 60 MG/ML SOSY injection Inject 60 mg into the skin every 6 (six) months.     [provider]  furosemide (LASIX) 20 MG tablet Take 1 tablet (20 mg total) by mouth daily. 05/19/17   Jerline Pain, MD  guaiFENesin-dextromethorphan (ROBITUSSIN DM) 100-10 MG/5ML syrup Take 5 mLs by mouth every 4 (four) hours as needed for cough. 07/09/17   Ledell Noss, MD  ipratropium-albuterol (DUONEB) 0.5-2.5 (3) MG/3ML SOLN Take 3 mLs by nebulization every 6 (six) hours as needed. 07/09/17   Ledell Noss, MD  isosorbide mononitrate (IMDUR) 30 MG 24 hr tablet TAKE 1 TABLET BY MOUTH EVERY DAY 07/04/17   Camnitz, Ocie Doyne, MD  LORazepam (ATIVAN) 0.5 MG tablet Take 0.5 mg by mouth 2 (two) times daily as needed for anxiety.     [provider]  meclizine (ANTIVERT)  12.5 MG tablet Take 12.5 mg by mouth 3 (three) times daily as needed for dizziness.  05/17/17   [provider]  nitroGLYCERIN (NITROSTAT) 0.4 MG SL tablet Place 1 tablet (0.4 mg total) under the tongue every 5 (five) minutes as needed for chest pain. Patient not taking: Reported on 07/06/2017 01/08/15   Almyra Deforest, PA  sotalol (BETAPACE) 120 MG tablet Take 1 tablet (120 mg total) by mouth 2 (two) times daily. 09/08/17   Jerline Pain, MD    Family History Family History  Problem Relation Age of Onset  . Heart attack Mother   . Cancer Sister   . Cancer Brother     Social History Social History   Tobacco Use  . Smoking status: Never Smoker  . Smokeless tobacco: Never Used  Substance Use Topics  . Alcohol use: No  . Drug use: No     Allergies   Ceftriaxone; Penicillins; Levofloxacin; Levofloxacin; Oxycodone; Tape; Alendronate; Aspirin; Oxycodone-acetaminophen; and Penicillin g   Review of Systems Review of Systems  Constitutional: Negative for chills and fever.  HENT: Negative for congestion and rhinorrhea.   Eyes: Negative for redness and visual disturbance.  Respiratory: Positive for cough and shortness of breath. Negative for wheezing.   Cardiovascular: Negative for chest pain and palpitations.  Gastrointestinal: Negative for nausea and vomiting.  Genitourinary: Negative for dysuria and urgency.  Musculoskeletal: Negative for arthralgias and myalgias.  Skin: Negative for pallor and wound.  Neurological: Negative for dizziness and headaches.     Physical Exam Updated Vital Signs BP (!) 170/88   Pulse (!) 57   Temp 98.4 F (36.9 C) (Oral)   Resp 18   Ht 5\' 5"  (1.651 m)   Wt 127 kg   SpO2 100%   BMI 46.59 kg/m   Physical Exam  Constitutional: She is oriented to person, place, and time. She appears well-developed and well-nourished. No distress.  HENT:  Head: Normocephalic and atraumatic.  Swollen turbinates, posterior nasal drip, no noted sinus ttp, tm  normal bilaterally.    Eyes: Pupils are equal, round, and reactive to light. EOM are normal.  Neck: Normal range of motion. Neck supple.  Cardiovascular: Normal rate and regular rhythm. Exam reveals no gallop and no friction rub.  No murmur heard. Pulmonary/Chest: Effort normal. She has no  wheezes. She has no rales.  Abdominal: Soft. She exhibits no distension and no mass. There is no tenderness. There is no guarding.  Musculoskeletal: She exhibits no edema or tenderness.  Neurological: She is alert and oriented to person, place, and time.  Skin: Skin is warm and dry. She is not diaphoretic.  Psychiatric: She has a normal mood and affect. Her behavior is normal.  Nursing note and vitals reviewed.    ED Treatments / Results  Labs (all labs ordered are listed, but only abnormal results are displayed) Labs Reviewed  CBC WITH DIFFERENTIAL/PLATELET - Abnormal; Notable for the following components:      Result Value   RDW 17.1 (*)    Platelets 522 (*)    Monocytes Absolute 1.1 (*)    All other components within normal limits  BASIC METABOLIC PANEL - Abnormal; Notable for the following components:   Potassium 3.2 (*)    Glucose, Bld 101 (*)    Calcium 7.4 (*)    All other components within normal limits    EKG EKG Interpretation  Date/Time:  Wednesday October 18 2017 08:34:20 EDT Ventricular Rate:  65 PR Interval:    QRS Duration: 103 QT Interval:  413 QTC Calculation: 430 R Axis:   86 Text Interpretation:  Sinus arrhythmia Borderline right axis deviation Anteroseptal infarct, old No significant change since last tracing Confirmed by Deno Etienne [phone]) on 10/18/2017 8:49:00 AM   Radiology Dg Chest 2 View  Result Date: 10/18/2017 CLINICAL DATA:  Shortness of breath with cough and congestion EXAM: CHEST - 2 VIEW COMPARISON:  October 16, 2017 FINDINGS: There is no evident edema or consolidation. Heart is slightly enlarged with pulmonary vascularity normal. No adenopathy. No  evident bone lesions. IMPRESSION: Mild cardiac enlargement.  No edema or consolidation. Electronically Signed   By: Lowella Grip III M.D.   On: 10/18/2017 09:15    Procedures Procedures (including critical care time)  Medications Ordered in ED Medications - No data to display   Initial Impression / Assessment and Plan / ED Course  I have reviewed the triage vital signs and the nursing notes.  Pertinent labs & imaging results that were available during my care of the patient were reviewed by me and considered in my medical decision making (see chart for details).     72 yo F with a chief complaint of URI-like symptoms.  Chest x-ray repeated today without focal infiltrate is viewed by me.  Basic lab work without a significant finding potassium was mildly low at 3.2 hemoglobin is normal, no leukocytosis even on steroids.  The patient was able to ambulate in the ED with an oxygen saturation staying above 95%.  At this time I feel its unlikely to be bacterial without a source found on my exam and with a normal chest x-ray.  We will have the patient go home with symptomatic therapy.  PCP follow-up.  10:24 AM:  I have discussed the diagnosis/risks/treatment options with the patient and believe the pt to be eligible for discharge home to follow-up with PCP. We also discussed returning to the ED immediately if new or worsening sx occur. We discussed the sx which are most concerning (e.g., sudden worsening pain, fever, inability to tolerate by mouth) that necessitate immediate return. Medications administered to the patient during their visit and any new prescriptions provided to the patient are listed below.  Medications given during this visit Medications - No data to display   The patient appears reasonably screen and/or  stabilized for discharge and I doubt any other medical condition or other [facility] requiring further screening, evaluation, or treatment in the ED at this time prior to discharge.      Final Clinical Impressions(s) / ED Diagnoses   Final diagnoses:  Viral URI with cough    ED Discharge Orders    None       Deno Etienne, DO 10/18/17 1024

## 2017-10-18 NOTE — ED Triage Notes (Signed)
"   I had a rough night and I have pneumonia " Was eval in MD's office on Monday and started on Z-Pak and prednisone

## 2017-10-18 NOTE — Discharge Instructions (Signed)
Take tylenol 2 pills 4 times a day.  Drink plenty of fluids.  Return for worsening shortness of breath, headache, confusion. Follow up with your family doctor.     

## 2017-10-25 ENCOUNTER — Ambulatory Visit (INDEPENDENT_AMBULATORY_CARE_PROVIDER_SITE_OTHER): Payer: Medicare Other | Admitting: *Deleted

## 2017-10-25 DIAGNOSIS — I481 Persistent atrial fibrillation: Secondary | ICD-10-CM | POA: Diagnosis not present

## 2017-10-25 DIAGNOSIS — Z5181 Encounter for therapeutic drug level monitoring: Secondary | ICD-10-CM | POA: Diagnosis not present

## 2017-10-25 DIAGNOSIS — I4819 Other persistent atrial fibrillation: Secondary | ICD-10-CM

## 2017-10-25 LAB — POCT INR: INR: 2.7 (ref 2.0–3.0)

## 2017-10-25 NOTE — Patient Instructions (Signed)
Description   Continue same dose Coumadin 5mg (1 tablet) daily except 7.5 mg (1.5 tablets) on Sundays, Wednesdays and Fridays. Recheck INR 4 weeks. Call us with any new medications or concerns #[phone].     

## 2017-11-08 ENCOUNTER — Other Ambulatory Visit: Payer: Self-pay | Admitting: Cardiology

## 2017-11-21 ENCOUNTER — Encounter (INDEPENDENT_AMBULATORY_CARE_PROVIDER_SITE_OTHER): Payer: Self-pay

## 2017-11-21 ENCOUNTER — Ambulatory Visit (INDEPENDENT_AMBULATORY_CARE_PROVIDER_SITE_OTHER): Payer: Medicare Other | Admitting: *Deleted

## 2017-11-21 DIAGNOSIS — I4819 Other persistent atrial fibrillation: Secondary | ICD-10-CM

## 2017-11-21 DIAGNOSIS — Z5181 Encounter for therapeutic drug level monitoring: Secondary | ICD-10-CM | POA: Diagnosis not present

## 2017-11-21 LAB — POCT INR: INR: 2.8 (ref 2.0–3.0)

## 2017-11-21 NOTE — Patient Instructions (Signed)
Description   Continue same dose Coumadin 5mg (1 tablet) daily except 7.5 mg (1.5 tablets) on Sundays, Wednesdays and Fridays. Recheck INR 4 weeks. Call us with any new medications or concerns #[phone].     

## 2017-11-29 ENCOUNTER — Telehealth: Payer: Self-pay | Admitting: *Deleted

## 2017-11-29 ENCOUNTER — Other Ambulatory Visit: Payer: Self-pay | Admitting: Cardiology

## 2017-11-29 NOTE — Telephone Encounter (Signed)
Pt aware received EKG's from [place] and that lead 3 and 4 changes are due to placement ./cy

## 2017-12-19 ENCOUNTER — Ambulatory Visit (INDEPENDENT_AMBULATORY_CARE_PROVIDER_SITE_OTHER): Payer: Medicare Other | Admitting: *Deleted

## 2017-12-19 DIAGNOSIS — I4819 Other persistent atrial fibrillation: Secondary | ICD-10-CM

## 2017-12-19 DIAGNOSIS — Z5181 Encounter for therapeutic drug level monitoring: Secondary | ICD-10-CM | POA: Diagnosis not present

## 2017-12-19 LAB — POCT INR: INR: 3.5 — AB (ref 2.0–3.0)

## 2017-12-19 NOTE — Patient Instructions (Addendum)
Description   Do not take any Coumadin today then continue same dose Coumadin 5mg  (1 tablet) daily except 7.5 mg (1.5 tablets) on Sundays, Wednesdays and Fridays. Recheck INR 3 weeks. Call us with any new medications or concerns [phone].

## 2018-01-09 ENCOUNTER — Ambulatory Visit (INDEPENDENT_AMBULATORY_CARE_PROVIDER_SITE_OTHER): Payer: Medicare Other

## 2018-01-09 DIAGNOSIS — I4819 Other persistent atrial fibrillation: Secondary | ICD-10-CM | POA: Diagnosis not present

## 2018-01-09 DIAGNOSIS — Z5181 Encounter for therapeutic drug level monitoring: Secondary | ICD-10-CM | POA: Diagnosis not present

## 2018-01-09 LAB — POCT INR: INR: 2.4 (ref 2.0–3.0)

## 2018-01-09 NOTE — Patient Instructions (Signed)
Description   Continue same dose Coumadin 5mg  (1 tablet) daily except 7.5 mg (1.5 tablets) on Sundays, Wednesdays and Fridays. Recheck INR 4 weeks. Call us with any new medications or concerns [phone].

## 2018-02-05 ENCOUNTER — Other Ambulatory Visit: Payer: Self-pay | Admitting: Cardiology

## 2018-02-06 ENCOUNTER — Ambulatory Visit (INDEPENDENT_AMBULATORY_CARE_PROVIDER_SITE_OTHER): Payer: Medicare Other

## 2018-02-06 DIAGNOSIS — Z5181 Encounter for therapeutic drug level monitoring: Secondary | ICD-10-CM

## 2018-02-06 DIAGNOSIS — I4819 Other persistent atrial fibrillation: Secondary | ICD-10-CM

## 2018-02-06 LAB — POCT INR: INR: 2.8 (ref 2.0–3.0)

## 2018-02-06 NOTE — Patient Instructions (Signed)
Description   Continue same dose Coumadin 5mg  (1 tablet) daily except 7.5 mg (1.5 tablets) on Sundays, Wednesdays and Fridays. Recheck INR 5 weeks. Call us with any new medications or concerns [phone].

## 2018-03-01 ENCOUNTER — Other Ambulatory Visit: Payer: Self-pay | Admitting: *Deleted

## 2018-03-01 NOTE — Progress Notes (Signed)
Cleaning up duplicates on med list.

## 2018-03-10 ENCOUNTER — Other Ambulatory Visit: Payer: Self-pay | Admitting: Cardiology

## 2018-03-13 ENCOUNTER — Ambulatory Visit (INDEPENDENT_AMBULATORY_CARE_PROVIDER_SITE_OTHER): Payer: Medicare Other | Admitting: *Deleted

## 2018-03-13 DIAGNOSIS — I4819 Other persistent atrial fibrillation: Secondary | ICD-10-CM | POA: Diagnosis not present

## 2018-03-13 DIAGNOSIS — Z5181 Encounter for therapeutic drug level monitoring: Secondary | ICD-10-CM | POA: Diagnosis not present

## 2018-03-13 LAB — POCT INR: INR: 3 (ref 2.0–3.0)

## 2018-03-13 NOTE — Telephone Encounter (Signed)
Routing to [employer] for advisement

## 2018-03-13 NOTE — Patient Instructions (Signed)
Description   Continue same dose Coumadin 5mg  (1 tablet) daily except 7.5 mg (1.5 tablets) on Sundays, Wednesdays and Fridays. Recheck INR 6 weeks. Call us with any new medications or concerns [phone].

## 2018-04-23 ENCOUNTER — Telehealth: Payer: Self-pay

## 2018-04-23 NOTE — Telephone Encounter (Signed)
LMOM FOR PRESCREEN AND MADE AWARE OF DRIVE THRU

## 2018-04-24 ENCOUNTER — Other Ambulatory Visit: Payer: Self-pay

## 2018-04-24 ENCOUNTER — Ambulatory Visit (INDEPENDENT_AMBULATORY_CARE_PROVIDER_SITE_OTHER): Payer: Medicare Other | Admitting: Pharmacist

## 2018-04-24 DIAGNOSIS — Z5181 Encounter for therapeutic drug level monitoring: Secondary | ICD-10-CM

## 2018-04-24 DIAGNOSIS — I4819 Other persistent atrial fibrillation: Secondary | ICD-10-CM

## 2018-04-24 LAB — POCT INR: INR: 2.5 (ref 2.0–3.0)

## 2018-05-03 ENCOUNTER — Other Ambulatory Visit: Payer: Self-pay | Admitting: Cardiology

## 2018-05-06 ENCOUNTER — Other Ambulatory Visit: Payer: Self-pay | Admitting: Cardiology

## 2018-05-08 ENCOUNTER — Other Ambulatory Visit: Payer: Self-pay | Admitting: Cardiology

## 2018-06-05 ENCOUNTER — Other Ambulatory Visit: Payer: Self-pay | Admitting: Cardiology

## 2018-06-18 ENCOUNTER — Telehealth: Payer: Self-pay

## 2018-06-18 NOTE — Telephone Encounter (Signed)

## 2018-06-19 ENCOUNTER — Ambulatory Visit (INDEPENDENT_AMBULATORY_CARE_PROVIDER_SITE_OTHER): Payer: Medicare Other | Admitting: *Deleted

## 2018-06-19 ENCOUNTER — Other Ambulatory Visit: Payer: Self-pay

## 2018-06-19 DIAGNOSIS — I4819 Other persistent atrial fibrillation: Secondary | ICD-10-CM

## 2018-06-19 DIAGNOSIS — Z5181 Encounter for therapeutic drug level monitoring: Secondary | ICD-10-CM

## 2018-06-19 LAB — POCT INR: INR: 3.2 — AB (ref 2.0–3.0)

## 2018-07-02 ENCOUNTER — Other Ambulatory Visit: Payer: Self-pay | Admitting: Cardiology

## 2018-07-02 MED ORDER — ISOSORBIDE MONONITRATE ER 30 MG PO TB24: 30.0000 mg | ORAL_TABLET | Freq: Every day | ORAL | 0 refills | Status: DC

## 2018-07-04 ENCOUNTER — Telehealth: Payer: Self-pay

## 2018-07-04 NOTE — Telephone Encounter (Signed)

## 2018-07-09 ENCOUNTER — Telehealth: Payer: Self-pay | Admitting: Cardiology

## 2018-07-09 MED ORDER — METOPROLOL SUCCINATE ER 100 MG PO TB24: 100.0000 mg | ORAL_TABLET | Freq: Every day | ORAL | 1 refills | Status: DC

## 2018-07-09 NOTE — Telephone Encounter (Signed)
Dr. Irish Lack (DOD) spoke with Vedia Coffer, [place] at [facility]. Patient was having some bradycardia and dizziness. Per Dr. Irish Lack patient will decrease Toprol to 100 mg QD and f/u with Dr. Curt Bears later this week. Per Dr. Irish Lack this can be virtual if patient has a way to check HR at home.   Attempted to contact patient but there was no answer. Left message for patient to call back.

## 2018-07-09 NOTE — Telephone Encounter (Signed)
Left message for patient to call back        COVID-19 Pre-Screening Questions:  . In the past 7 to 10 days have you had a cough,  shortness of breath, headache, congestion, fever (100 or greater) body aches, chills, sore throat, or sudden loss of taste or sense of smell? no . Have you been around anyone with known Covid 19. NO . Have you been around anyone who is awaiting Covid 19 test results in the past 7 to 10 days? No . Have you been around anyone who has been exposed to Covid 19, or has mentioned symptoms of Covid 19 within the past 7 to 10 days? no  If you have any concerns/questions about symptoms patients report during screening (either on the phone or at threshold). Contact the provider seeing the patient or DOD for further guidance.  If neither are available contact a member of the leadership team.   Martin Majestic over Dr. Hassell Done recommendations. Patient stated she is feeling fine now. Patient stated she has been just taking metoprolol 100 mg daily and that she never took the metoprolol at night. Moved patient's appointment from next week to tomorrow with Dr. Curt Bears. Patient wanted her coumadin appt changed as well. Informed patient that a message would be sent to coumadin clinic to reschedule. Will send message to Dr. Curt Bears and his nurse, so they are aware.

## 2018-07-09 NOTE — Telephone Encounter (Signed)
[facility] is calling about the pt from [place] asking to speak with the nurse

## 2018-07-09 NOTE — Telephone Encounter (Signed)
Patient called back returning Brittany's call 

## 2018-07-09 NOTE — Telephone Encounter (Signed)
DOD, Dr. Irish Lack recommends patient to take metoprolol 50 mg tomorrow morning instead of 100 mg since she was not taking the original dose. Will wait to see what Dr. Curt Bears decides tomorrow about patient's medications before updating medication list.

## 2018-07-10 ENCOUNTER — Ambulatory Visit (INDEPENDENT_AMBULATORY_CARE_PROVIDER_SITE_OTHER): Payer: Medicare Other | Admitting: *Deleted

## 2018-07-10 ENCOUNTER — Encounter: Payer: Self-pay | Admitting: Cardiology

## 2018-07-10 ENCOUNTER — Other Ambulatory Visit: Payer: Self-pay

## 2018-07-10 ENCOUNTER — Ambulatory Visit (INDEPENDENT_AMBULATORY_CARE_PROVIDER_SITE_OTHER): Payer: Medicare Other | Admitting: Cardiology

## 2018-07-10 VITALS — BP 132/82 | HR 64 | Ht 65.0 in | Wt 280.0 lb

## 2018-07-10 DIAGNOSIS — Z5181 Encounter for therapeutic drug level monitoring: Secondary | ICD-10-CM

## 2018-07-10 DIAGNOSIS — Z79899 Other long term (current) drug therapy: Secondary | ICD-10-CM | POA: Diagnosis not present

## 2018-07-10 DIAGNOSIS — I4819 Other persistent atrial fibrillation: Secondary | ICD-10-CM | POA: Diagnosis not present

## 2018-07-10 LAB — POCT INR: INR: 3.2 — AB (ref 2.0–3.0)

## 2018-07-10 MED ORDER — METOPROLOL SUCCINATE ER 50 MG PO TB24: 50.0000 mg | ORAL_TABLET | Freq: Every day | ORAL | 2 refills | Status: DC

## 2018-07-10 NOTE — Patient Instructions (Addendum)
Medication Instructions:  Your physician recommends that you continue on your current medications as directed. Please refer to the Current Medication list given to you today.  * If you need a refill on your cardiac medications before your next appointment, please call your pharmacy.   Labwork: Today: BMET & Magnesium *We will only notify you of abnormal results, otherwise continue current treatment plan.  Testing/Procedures: None ordered  Follow-Up: Your physician wants you to follow-up in: 6 months with Dr. Curt Bears.  You will receive a reminder letter in the mail two months in advance. If you don't receive a letter, please call our office to schedule the follow-up appointment.   Thank you for choosing [facility]!!   Trinidad Curet, RN [phone]

## 2018-07-10 NOTE — Progress Notes (Signed)
Electrophysiology Office Note   Date:  07/10/2018   ID:  [name], DOB [birth year], MRN [id number]  PCP:  Elisabeth Cara, PA-C  Cardiologist:  Marlou Porch Primary Electrophysiologist:  Constance Haw, MD    No chief complaint on file.    History of Present Illness: [name] is a 73 y.o. female who presents today for electrophysiology evaluation.   History of AF, obesity, HTN, CHF, CAD, HLD. Presented to the hospital 9/3 for tikosyn loading. Switched to sotalol as tikosyn not covered by [employer]. Cardioversion 10/03/15.    Today, denies symptoms of palpitations, chest pain, shortness of breath, orthopnea, PND, lower extremity edema, claudication, dizziness, presyncope, syncope, bleeding, or neurologic sequela. The patient is tolerating medications without difficulties.  Overall she is doing well.  She had episode yesterday where she was standing bradycardic and dizzy.  Her metoprolol dose was decreased to 50 mg.  She has felt well since then.   Past Medical History:  Diagnosis Date  . CAD (coronary artery disease)    a. LHC 3/17: mLAD 80, D1 50, pRCA 30, EF 25-35%; PCI: 3.5 x 16 mm Synergy DES to mLAD  . Chronic systolic CHF (congestive heart failure) ([place])    a. Echo 12/16: Mild concentric LVH, EF 25-30%, anteroseptal akinesis, inferoseptal akinesis, anterior akinesis, trivial AI, mild MR, moderate LAE, trivial TR, trivial PI, PASP 33 mmHg  //  b. Echo 2/17: Mild LVH, EF 25-30%, diffuse HK, mild LAE  //  c. Echo 7/17: mild LVH, EF 50-55%, no RWMA, trivial AI, mild to mod LAE, mild RAE  . Febrile seizure ([place]) 2004   "during TTP thing; cause my temp was 106"  . High cholesterol   . History of blood transfusion 2004   "several; related to TTP"  . Hypertension   . Hypothyroidism   . Ischemic cardiomyopathy   . Morbid obesity ([place]) 01/07/2013  . Osteoarthritis of back   . Paroxysmal atrial fibrillation ([place]) 08/01/2011   a. started on Sotalol during 10/2015  admission (Tikosyn too expensive)  . Pneumonia ?1999  . Scoliosis   . Thyroid nodule    "grew back after 2 partial thyroid surgeries" (09/29/2015)  . 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 ABDOMINAL EXPLORATION SURGERY     "opened me up 2-3 times for blocked intestines"  . APPENDECTOMY    . BACK SURGERY    . BASAL CELL CARCINOMA EXCISION Right    "neck"  . CARDIAC CATHETERIZATION N/A 04/17/2015   Procedure: Left Heart Cath and Coronary Angiography;  Surgeon: Jettie Booze, MD;  Location: [facility];  Service: Cardiovascular;  Laterality: N/A;  . CARDIAC CATHETERIZATION  04/17/2015   Procedure: Coronary Stent Intervention;  Surgeon: Jettie Booze, MD;  Location: [facility];  Service: Cardiovascular;;  . CARDIOVERSION  06/2002; 03/2005; 05/2005   Archie Endo 06/16/2010  . CARDIOVERSION N/A 10/03/2015   Procedure: CARDIOVERSION;  Surgeon: Deboraha Sprang, MD;  Location: [facility];  Service: Cardiovascular;  Laterality: N/A;  . CATARACT EXTRACTION W/ INTRAOCULAR LENS  IMPLANT, BILATERAL Bilateral   . CHOLECYSTECTOMY OPEN    . CORONARY ANGIOPLASTY    . DILATION AND CURETTAGE OF UTERUS     S/P "miscarriage"  . FOOT SURGERY Right    "for nerve damage"  . SALIVARY STONE REMOVAL Left    "had tumor wrapped around it"  . SPLENECTOMY, TOTAL     Archie Endo 06/16/2010  .  TEE WITHOUT CARDIOVERSION N/A 09/30/2015   Procedure: TRANSESOPHAGEAL ECHOCARDIOGRAM (TEE);  Surgeon: Skeet Latch, MD;  Location: [facility];  Service: Cardiovascular;  Laterality: N/A;  . THORACIC DISCECTOMY  05/2002  . THYROIDECTOMY, PARTIAL  X 2   "grew back"  . TONSILLECTOMY    . TUBAL LIGATION    . TUMOR EXCISION     "benign tumor in my neck"  . VAGINAL HYSTERECTOMY       Current Outpatient Medications  Medication Sig Dispense Refill  . benazepril (LOTENSIN) 10 MG tablet Take 1 tablet (10 mg total) by mouth daily. Please call  and schedule an appt for further refills 1st attempt 90 tablet 0  . Cholecalciferol (VITAMIN D3) 2000 units capsule Take 2,000 Units by mouth every morning.    . clopidogrel (PLAVIX) 75 MG tablet TAKE 1 TABLET BY MOUTH EVERY DAY 90 tablet 1  . furosemide (LASIX) 20 MG tablet TAKE 1 TABLET BY MOUTH EVERY DAY 90 tablet 3  . ibandronate (BONIVA) 150 MG tablet Take 150 mg by mouth every 30 (thirty) days. Take in the morning with a full glass of water, on an empty stomach, and do not take anything else by mouth or lie down for the next 30 min.    . isosorbide mononitrate (IMDUR) 30 MG 24 hr tablet Take 1 tablet (30 mg total) by mouth daily. Please make overdue appt with Dr. Curt Bears before anymore refills. 1st attempt 30 tablet 0  . levothyroxine (SYNTHROID, LEVOTHROID) 50 MCG tablet Take 50 mcg by mouth daily before breakfast.    . LORazepam (ATIVAN) 0.5 MG tablet Take 0.5 mg by mouth 2 (two) times daily as needed for anxiety.     . nitroGLYCERIN (NITROSTAT) 0.4 MG SL tablet Place 1 tablet (0.4 mg total) under the tongue every 5 (five) minutes as needed for chest pain. 25 tablet 3  . sotalol (BETAPACE) 120 MG tablet TAKE 1 TABLET (120 MG TOTAL) BY MOUTH 2 (TWO) TIMES DAILY. 180 tablet 1  . warfarin (COUMADIN) 5 MG tablet TAKE AS DIRECTED BY COUMADIN CLINIC 120 tablet 0  . metoprolol succinate (TOPROL-XL) 50 MG 24 hr tablet Take 1 tablet (50 mg total) by mouth daily. Take with or immediately following a meal. 90 tablet 2   No current facility-administered medications for this visit.     Allergies:   Ceftriaxone; Penicillins; Levofloxacin; Levofloxacin; Oxycodone; Tape; Alendronate; Aspirin; Oxycodone-acetaminophen; and Penicillin g   Social History:  The patient  reports that she has never smoked. She has never used smokeless tobacco. She reports that she does not drink alcohol or use drugs.   Family History:  The patient's family history includes Cancer in her brother and sister; Heart attack in her  mother.    ROS:  Please see the history of present illness.   Otherwise, review of systems is positive for none.   All other systems are reviewed and negative.   PHYSICAL EXAM: VS:  BP 132/82   Pulse 64   Ht 5\' 5"  (1.651 m)   Wt 280 lb (127 kg)   BMI 46.59 kg/m  , BMI Body mass index is 46.59 kg/m. GEN: Well nourished, well developed, in no acute distress  HEENT: normal  Neck: no JVD, carotid bruits, or masses Cardiac: RRR; no murmurs, rubs, or gallops,no edema  Respiratory:  clear to auscultation bilaterally, normal work of breathing GI: soft, nontender, nondistended, + BS MS: no deformity or atrophy  Skin: warm and dry Neuro:  Strength and sensation are intact  Psych: euthymic mood, full affect  EKG:  EKG is ordered today. Personal review of the ekg ordered shows sinus rhythm  Recent Labs: 10/18/2017: BUN 13; Creatinine, Ser 0.81; Hemoglobin 15.0; Platelets 522; Potassium 3.2; Sodium 139    Lipid Panel     Component Value Date/Time   CHOL 178 09/23/2016 0912   TRIG 90 09/23/2016 0912   HDL 37 (L) 09/23/2016 0912   CHOLHDL 4.8 09/23/2016 0912   VLDL 18 09/23/2016 0912   LDLCALC 123 (H) 09/23/2016 0912     Wt Readings from Last 3 Encounters:  07/10/18 280 lb (127 kg)  10/18/17 280 lb (127 kg)  07/09/17 278 lb (126.1 kg)      Other studies Reviewed: Additional studies/ records that were reviewed today include: TEE 09/30/15  Review of the above records today demonstrates:  - Left ventricle: Systolic function was normal. The estimated   ejection fraction was in the range of 50% to 55%. Wall motion was   normal; there were no regional wall motion abnormalities. - Aortic valve: There was mild regurgitation. - Mitral valve: There was mild regurgitation, with multiple jets   directed centrally. - Left atrium: No evidence of thrombus in the atrial cavity or   appendage. No evidence of thrombus in the atrial cavity or   appendage. - Right atrium: No evidence of  thrombus in the atrial cavity or   appendage. - Atrial septum: No defect or patent foramen ovale was identified   by color flow Doppler or saline microcavitation study.   ASSESSMENT AND PLAN:  1.  Atrial fibrillation: On sotalol and Coumadin.  She remains in sinus rhythm.  He did have an episode of bradycardia yesterday.  Agree with decreased dose of metoprolol to 50 mg.  This patients CHA2DS2-VASc Score and unadjusted Ischemic Stroke Rate (% per year) is equal to 4.8 % stroke rate/year from a score of 4  Above score calculated as 1 point each if present [CHF, HTN, DM, Vascular=MI/PAD/Aortic Plaque, Age if 65-74, or Female] Above score calculated as 2 points each if present [Age > 75, or Stroke/TIA/TE]   2. Morbid obesity: Diet restrictions encouraged.    3. CAD: Currently asymptomatic.  Continue current management.  4. Hypertension: Blood pressure well controlled   Current medicines are reviewed at length with the patient today.   The patient does not have concerns regarding her medicines.  The following changes were made today: None  Labs/ tests ordered today include:  Orders Placed This Encounter  Procedures  . Basic metabolic panel  . Magnesium  . Basic metabolic panel  . Magnesium     Disposition:   FU with Anastyn Ayars 12 months  Signed, Otto Caraway Meredith Leeds, MD  07/10/2018 1:06 PM     [address] [place] [address] [phone] (office) [phone] (fax)

## 2018-07-10 NOTE — Patient Instructions (Signed)
Description   Skip today's dose, then change dose to Coumadin 5mg  (1 tablet) daily except 7.5 mg (1.5 tablets) on Sundays and Wednesdays. Recheck INR 3 weeks. Call us with any new medications or concerns [phone].

## 2018-07-17 ENCOUNTER — Ambulatory Visit: Payer: Medicare Other | Admitting: Cardiology

## 2018-07-24 ENCOUNTER — Telehealth: Payer: Self-pay

## 2018-07-24 NOTE — Telephone Encounter (Signed)

## 2018-07-28 ENCOUNTER — Other Ambulatory Visit: Payer: Self-pay | Admitting: Cardiology

## 2018-07-31 ENCOUNTER — Other Ambulatory Visit: Payer: Medicare Other

## 2018-07-31 ENCOUNTER — Ambulatory Visit (INDEPENDENT_AMBULATORY_CARE_PROVIDER_SITE_OTHER): Payer: Medicare Other | Admitting: *Deleted

## 2018-07-31 ENCOUNTER — Other Ambulatory Visit: Payer: Self-pay

## 2018-07-31 DIAGNOSIS — Z79899 Other long term (current) drug therapy: Secondary | ICD-10-CM

## 2018-07-31 DIAGNOSIS — I4819 Other persistent atrial fibrillation: Secondary | ICD-10-CM

## 2018-07-31 DIAGNOSIS — Z5181 Encounter for therapeutic drug level monitoring: Secondary | ICD-10-CM | POA: Diagnosis not present

## 2018-07-31 LAB — POCT INR: INR: 2.4 (ref 2.0–3.0)

## 2018-07-31 NOTE — Patient Instructions (Signed)
Description   Continue taking Coumadin 5mg  (1 tablet) daily except 7.5 mg (1.5 tablets) on Sundays and Wednesdays. Recheck INR 4 weeks. Call us with any new medications or concerns [phone].

## 2018-08-01 LAB — BASIC METABOLIC PANEL
BUN/Creatinine Ratio: 14 (ref 12–28)
BUN: 14 mg/dL (ref 8–27)
CO2: 27 mmol/L (ref 20–29)
Calcium: 8.6 mg/dL — ABNORMAL LOW (ref 8.7–10.3)
Chloride: 104 mmol/L (ref 96–106)
Creatinine, Ser: 0.99 mg/dL (ref 0.57–1.00)
GFR calc Af Amer: 66 mL/min/{1.73_m2} (ref >59)
GFR calc non Af Amer: 57 mL/min/{1.73_m2} — ABNORMAL LOW (ref >59)
Glucose: 108 mg/dL — ABNORMAL HIGH (ref 65–99)
Potassium: 4.4 mmol/L (ref 3.5–5.2)
Sodium: 142 mmol/L (ref 134–144)

## 2018-08-01 LAB — MAGNESIUM: Magnesium: 2.1 mg/dL (ref 1.6–2.3)

## 2018-08-02 ENCOUNTER — Other Ambulatory Visit: Payer: Self-pay | Admitting: Cardiology

## 2018-08-06 ENCOUNTER — Other Ambulatory Visit: Payer: Self-pay | Admitting: Cardiology

## 2018-08-07 ENCOUNTER — Telehealth: Payer: Self-pay | Admitting: Cardiology

## 2018-08-07 ENCOUNTER — Other Ambulatory Visit: Payer: Self-pay | Admitting: Cardiology

## 2018-08-07 MED ORDER — BENAZEPRIL HCL 10 MG PO TABS: 10.0000 mg | ORAL_TABLET | Freq: Every day | ORAL | 3 refills | Status: DC

## 2018-08-07 NOTE — Telephone Encounter (Signed)
New message   Pt c/o Shortness Of Breath: STAT if SOB developed within the last 24 hours or pt is noticeably SOB on the phone  1. Are you currently SOB (can you hear that pt is SOB on the phone)?no   2. How long have you been experiencing SOB?3 to 4 weeks   3. Are you SOB when sitting or when up moving around? Moving around   4. Are you currently experiencing any other symptoms? Chest pressure or tightness

## 2018-08-07 NOTE — Telephone Encounter (Signed)
Returned call to Pt.  Pt c/o SOB ongoing for 3-4 weeks.  Denies chest pain, states her chest "feels funny/different".  Pt has history of CAD and was previous patient of Dr. Marlou Porch but hasn't been seen by Dr. Gillian Shields since 2017.  Pt with history of CSHF/CAD with stent.   Continues on warfarin and Plavix.  Advised Pt she should be seen by general cardiology d/t new symptoms.  Pt in agreement.    Will see SW 08/08/2018 at [facility] office.  Pt indicates understanding and agreement.

## 2018-08-08 ENCOUNTER — Encounter: Payer: Self-pay | Admitting: *Deleted

## 2018-08-08 ENCOUNTER — Ambulatory Visit (INDEPENDENT_AMBULATORY_CARE_PROVIDER_SITE_OTHER): Payer: Medicare Other | Admitting: Physician Assistant

## 2018-08-08 ENCOUNTER — Telehealth: Payer: Self-pay

## 2018-08-08 ENCOUNTER — Encounter: Payer: Self-pay | Admitting: Physician Assistant

## 2018-08-08 ENCOUNTER — Other Ambulatory Visit: Payer: Self-pay

## 2018-08-08 VITALS — BP 166/92 | HR 61 | Ht 65.0 in | Wt 287.0 lb

## 2018-08-08 DIAGNOSIS — I10 Essential (primary) hypertension: Secondary | ICD-10-CM

## 2018-08-08 DIAGNOSIS — R0602 Shortness of breath: Secondary | ICD-10-CM | POA: Diagnosis not present

## 2018-08-08 DIAGNOSIS — R0789 Other chest pain: Secondary | ICD-10-CM | POA: Diagnosis not present

## 2018-08-08 DIAGNOSIS — I5033 Acute on chronic diastolic (congestive) heart failure: Secondary | ICD-10-CM | POA: Diagnosis not present

## 2018-08-08 DIAGNOSIS — I4819 Other persistent atrial fibrillation: Secondary | ICD-10-CM

## 2018-08-08 DIAGNOSIS — R001 Bradycardia, unspecified: Secondary | ICD-10-CM

## 2018-08-08 DIAGNOSIS — I2510 Atherosclerotic heart disease of native coronary artery without angina pectoris: Secondary | ICD-10-CM

## 2018-08-08 MED ORDER — POTASSIUM CHLORIDE ER 10 MEQ PO TBCR: 10.0000 meq | EXTENDED_RELEASE_TABLET | Freq: Every day | ORAL | 0 refills | Status: DC

## 2018-08-08 NOTE — Patient Instructions (Addendum)
Medication Instructions:   Start taking Lasix 40 mg once a day  for 7 days  Then lasix 20 mg once a day   For 7 days take Potassium 10 meq and stop   If you need a refill on your cardiac medications before your next appointment, please call your pharmacy.   Lab work:  BNP    BMET IN ONE WEEK   If you have labs (blood work) drawn today and your tests are completely normal, you will receive your results only by: Marland Kitchen MyChart Message (if you have MyChart) OR . A paper copy in the mail If you have any lab test that is abnormal or we need to change your treatment, we will call you to review the results.  Testing/Procedures: Your physician has requested that you have an echocardiogram. Echocardiography is a painless test that uses sound waves to create images of your heart. It provides your doctor with information about the size and shape of your heart and how well your heart's chambers and valves are working. This procedure takes approximately one hour. There are no restrictions for this procedure.  Your physician has requested that you have a lexiscan myoview. For further information please visit [email or web address]. Please follow instruction sheet, as given.    Follow-Up: At [facility], you and your health needs are our priority.  As part of our continuing mission to provide you with exceptional heart care, we have created designated Provider Care Teams.  These Care Teams include your primary Cardiologist (physician) and Advanced Practice Providers (APPs -  Physician Assistants and Nurse Practitioners) who all work together to provide you with the care you need, when you need it. You will need a follow up appointment in 1-2  weeks.  Please call our office 2 months in advance to schedule this appointment.  You may see [name], MD or one of the following Advanced Practice Providers on your designated Care Team:   Truitt Merle, NP Cecilie Kicks, NP . Kathyrn Drown, NP  Any Other Special  Instructions Will Be Listed Below (If Applicable).

## 2018-08-08 NOTE — Progress Notes (Signed)
Cardiology Office Note:    Date:  08/08/2018   ID:  [name], DOB [birth year], MRN [id number]  PCP:  Elisabeth Cara, PA-C  Cardiologist:  [name], MD   Electrophysiologist:  Constance Haw, MD   Referring MD: Elisabeth Cara, *   Chief Complaint  Patient presents with  . Shortness of Breath  . Chest Pain     History of Present Illness:    [name] is a 73 y.o. female with:  Coronary artery disease  S/p DES to LAD in 04/2015  Persistent AFib  S/p DCCV in 2007  Recurrent AFib in 2016   Tikosyn not covered by insurance >> Sotalol started in 2017 >> DCCV >> NSR  CHADS2-VASc=5 (age, Diabetes, HTN, CHF, CAD) >> Coumadin  Bradycardia: Metoprolol reduced in 07/2018  Dilated CM  EF 25-30 in 2016 >> improved to normal in sinus rhythm  Echo 8/18: EF 55-60, Gr 2 diast dysfn  Chronic Diastolic CHF   Diabetes mellitus  Hypertension  Hyperlipidemia  Hx of TTP in 2004  Obesity  Ms. [name] was last seen by Dr. Curt Bears in 07/2018.  Her beta-blocker was reduced at that visit due to bradycardia.  She returns for evaluation of shortness of breath and chest pain.  She has been feeling poorly for the past month.  She has been experiencing shortness of breath with just minimal activity.  She notes orthopnea as well as paroxysmal nocturnal dyspnea.  Her legs are more swollen.  She has not had syncope.  She did have significant dizziness and near syncope prior to reducing her beta-blocker.  Her dizziness has resolved since then.  She has had some chest discomfort off and on.  She describes a left-sided "electric pain" that lasts only seconds.  She has also had some fullness at times.  Her chest discomfort is not related to exertion.  Overall she feels tired/fatigued.  She remembers feeling bad before her PCI in 2017 and was worried she had a new blockage.    Prior CV studies:   The following studies were reviewed today:  Echocardiogram  09/24/2016 Mild concentric LVH, EF 55-60, normal wall motion, grade 2 diastolic dysfunction, mildly calcified aortic valve leaflets, trivial AI, trivial MR, mild LAE (AP dimension 45), trivial PI, trivial pericardial effusion  Transesophageal echocardiogram 09/30/2015 EF 50-55, normal wall motion, mild AI, mild MR, no evidence of thrombus in LA/RA or LAA  Echocardiogram 08/21/2015 Mild concentric LVH, EF 50-55, normal wall motion, trivial AI, mild to moderate LAE, mild RAE  LHC 04/17/15 LAD mid 80%, D1 50% RCA proximal 30% EF 25-35% PCI: STENT SYNERGY DES 3.5X16 to mLAD   Echo 03/31/15 Mild LVH, EF 25-30%, diffuse HK, mild LAE   Echo 01/06/15 Mild concentric LVH, EF 25-30%, anteroseptal akinesis, inferoseptal akinesis, anterior akinesis, trivial AI, mild MR, moderate LAE, trivial TR, trivial PI, PASP 33 mmHg   Past Medical History:  Diagnosis Date  . CAD (coronary artery disease)    a. LHC 3/17: mLAD 80, D1 50, pRCA 30, EF 25-35%; PCI: 3.5 x 16 mm Synergy DES to mLAD  . Chronic systolic CHF (congestive heart failure) ([place])    a. Echo 12/16: Mild concentric LVH, EF 25-30%, anteroseptal akinesis, inferoseptal akinesis, anterior akinesis, trivial AI, mild MR, moderate LAE, trivial TR, trivial PI, PASP 33 mmHg  //  b. Echo 2/17: Mild LVH, EF 25-30%, diffuse HK, mild LAE  //  c. Echo 7/17: mild LVH, EF 50-55%, no RWMA, trivial AI, mild  to mod LAE, mild RAE  . Febrile seizure ([place]) 2004   "during TTP thing; cause my temp was 106"  . High cholesterol   . History of blood transfusion 2004   "several; related to TTP"  . Hypertension   . Hypothyroidism   . Ischemic cardiomyopathy   . Morbid obesity ([place]) 01/07/2013  . Osteoarthritis of back   . Paroxysmal atrial fibrillation ([place]) 08/01/2011   a. started on Sotalol during 10/2015 admission (Tikosyn too expensive)  . Pneumonia ?1999  . Scoliosis   . Thyroid nodule    "grew back after 2 partial thyroid surgeries" (09/29/2015)  . TTP  (thrombotic thrombocytopenic purpura) ([place]) 05/03/2011   Surgical Hx: The patient  has a past surgical history that includes Appendectomy; Splenectomy, total; Tonsillectomy; Back surgery; Cholecystectomy open; Thoracic discectomy (05/2002); Cardioversion (06/2002; 03/2005; 05/2005); Abdominal exploration surgery; Cataract extraction w/ intraocular lens  implant, bilateral (Bilateral); Tumor excision; Thyroidectomy, partial (X 2); Excision basal cell carcinoma (Right); Vaginal hysterectomy; Tubal ligation; Dilation and curettage of uterus; Foot surgery (Right); Cardiac catheterization (N/A, 04/17/2015); Cardiac catheterization (04/17/2015); Coronary angioplasty; Salivary stone removal (Left); TEE without cardioversion (N/A, 09/30/2015); and Cardioversion (N/A, 10/03/2015).   Current Medications: Current Meds  Medication Sig  . benazepril (LOTENSIN) 10 MG tablet Take 1 tablet (10 mg total) by mouth daily.  . Cholecalciferol (VITAMIN D3) 2000 units capsule Take 2,000 Units by mouth every morning.  . clopidogrel (PLAVIX) 75 MG tablet TAKE 1 TABLET BY MOUTH EVERY DAY  . furosemide (LASIX) 20 MG tablet TAKE 1 TABLET BY MOUTH EVERY DAY  . isosorbide mononitrate (IMDUR) 30 MG 24 hr tablet TAKE 1 TAB DAILY. PLEASE MAKE OVERDUE APPT WITH DR. Curt Bears BEFORE ANYMORE REFILLS. 1ST ATTEMPT  . levothyroxine (SYNTHROID, LEVOTHROID) 50 MCG tablet Take 50 mcg by mouth daily before breakfast.  . metoprolol succinate (TOPROL-XL) 50 MG 24 hr tablet Take 1 tablet (50 mg total) by mouth daily. Take with or immediately following a meal.  . nitroGLYCERIN (NITROSTAT) 0.4 MG SL tablet Place 1 tablet (0.4 mg total) under the tongue every 5 (five) minutes as needed for chest pain.  . sotalol (BETAPACE) 120 MG tablet TAKE 1 TABLET (120 MG TOTAL) BY MOUTH 2 (TWO) TIMES DAILY.  Marland Kitchen warfarin (COUMADIN) 5 MG tablet TAKE AS DIRECTED BY COUMADIN CLINIC     Allergies:   Ceftriaxone, Penicillins, Levofloxacin, Levofloxacin, Oxycodone, Tape,  Alendronate, Aspirin, Oxycodone-acetaminophen, and Penicillin g   Social History   Tobacco Use  . Smoking status: Never Smoker  . Smokeless tobacco: Never Used  Substance Use Topics  . Alcohol use: No  . Drug use: No     Family Hx: The patient's family history includes Cancer in her brother and sister; Heart attack in her mother.  ROS:   Please see the history of present illness.    ROS All other systems reviewed and are negative.   EKGs/Labs/Other Test Reviewed:    EKG:  EKG is  ordered today.  The ekg ordered today demonstrates normal sinus rhythm, heart rate 61, rightward axis, septal Q waves, PVC, QTC 457, similar to prior tracings  Recent Labs: 10/18/2017: Hemoglobin 15.0; Platelets 522 07/31/2018: BUN 14; Creatinine, Ser 0.99; Magnesium 2.1; Potassium 4.4; Sodium 142   Recent Lipid Panel Lab Results  Component Value Date/Time   CHOL 178 09/23/2016 09:12 AM   TRIG 90 09/23/2016 09:12 AM   HDL 37 (L) 09/23/2016 09:12 AM   CHOLHDL 4.8 09/23/2016 09:12 AM   LDLCALC 123 (H) 09/23/2016 09:12 AM  Physical Exam:    VS:  BP (!) 166/92   Pulse 61   Ht 5\' 5"  (1.651 m)   Wt 287 lb (130.2 kg)   SpO2 96%   BMI 47.76 kg/m     Wt Readings from Last 3 Encounters:  08/08/18 287 lb (130.2 kg)  07/10/18 280 lb (127 kg)  10/18/17 280 lb (127 kg)     Physical Exam  Constitutional: She is oriented to person, place, and time. She appears well-developed and well-nourished. No distress.  HENT:  Head: Normocephalic and atraumatic.  Eyes: No scleral icterus.  Neck: No JVD (at 90 degrees) present. No thyromegaly present.  Cardiovascular: Normal rate, regular rhythm and normal heart sounds.  No murmur heard. Pulmonary/Chest: Effort normal and breath sounds normal. She has no rales.  Abdominal: Soft. She exhibits distension. There is no hepatomegaly.  Musculoskeletal:        General: Edema (1+ bilat LE edema) present.  Lymphadenopathy:    She has no cervical adenopathy.   Neurological: She is alert and oriented to person, place, and time.  Skin: Skin is warm and dry.  Psychiatric: She has a normal mood and affect.    ASSESSMENT & PLAN:    1. Shortness of breath 2. Acute on chronic diastolic CHF (congestive heart failure) ([place]) I suspect her symptoms are all related to volume overload.  She has had some chest discomfort and she is concerned that she may have a new blockage.  She remembers feeling exhausted prior to her previous PCI.  Her ECG is without acute changes.  She is not really having exertional chest discomfort.  I have recommended proceeding with stress testing, echocardiogram and increasing her diuresis.  She will need close follow-up.  -Obtain BNP  -Increase Lasix to 40 mg daily x1 week, then resume 20 mg daily  -Add potassium 10 mEq daily x1 week, then stop  -Obtain 2D echocardiogram  -Follow-up 1 week  3. Other chest pain 4. Coronary artery disease involving native coronary artery of native heart without angina pectoris History of DES to the LAD in March 2017.  She has had occasional chest pains over the past month.  As noted, these are nonexertional.  Her ECG does not demonstrate any acute changes.  She was intolerant of Atorvastatin due to myalgias.      -Continue clopidogrel, isosorbide, metoprolol.    -Obtain Lexiscan Myoview  -Discuss considering Rosuvastatin or Ezetimibe at follow up   5. Persistent atrial fibrillation She is maintaining normal sinus rhythm.  She remains on Warfarin and Sotalol.    6. Essential hypertension BP elevated today.  Increase Lasix as noted.  If BP still elevated at follow up, increase dose of benazepril.    7. Bradycardia Resolved on lower dose beta-blocker.     Dispo:  Return in about 1 week (around 08/15/2018) for Close Follow Up w/ Dr. Marlou Porch, or PA/NP.   Medication Adjustments/Labs and Tests Ordered: Current medicines are reviewed at length with the patient today.  Concerns regarding medicines are  outlined above.  Tests Ordered: Orders Placed This Encounter  Procedures  . Basic metabolic panel  . Pro b natriuretic peptide (BNP)  . MYOCARDIAL PERFUSION IMAGING  . ECHOCARDIOGRAM COMPLETE   Medication Changes: Meds ordered this encounter  Medications  . potassium chloride (K-DUR) 10 MEQ tablet    Sig: Take 1 tablet (10 mEq total) by mouth daily.    Dispense:  7 tablet    Refill:  0    Signed, Iriana Artley  Jorene Minors  08/08/2018 5:12 PM    [facility] [address] [place], [place], [place]  [address] Phone: [phone]; Fax: [phone]

## 2018-08-08 NOTE — Telephone Encounter (Signed)
[place] Visit Initial Request  Date of Request ([place]):  August 08, 2018  Requesting Provider:  Richardson Dopp, [facility]    Agency Requested:    [facility] Contact:  Glory Buff, NP [address] [place], [place] [address] Phone #:  [phone] Fax #:  [phone]  Patient Demographic Information: Name:  [name] Age:  73 y.o.   DOB:  25-Mar-1945  MRN:  [id number]   Address:   [address] [place] [address]   Phone Numbers:   Home Phone [phone]  Mobile [phone]     Emergency Contact Information on File:   Contact Information    Name Relation Home Work [place] Daughter   [place] Daughter  [phone]   Cameron Proud   [phone]   BIRGIT, NOWLING   [phone]      The above family members may be contacted for information on this patient (review DPR on file):  Yes    Patient Clinical Information:  Primary Care Provider:  Elisabeth Cara, [place]  Primary Cardiologist:  [name], MD  Primary Electrophysiologist:  Will Meredith Leeds, MD   Past Medical Hx: Ms. 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   Allergies: She is allergic to ceftriaxone; penicillins; levofloxacin; levofloxacin; oxycodone; tape; alendronate; aspirin; oxycodone-acetaminophen; and penicillin g.   Medications: Current Outpatient Medications on File Prior to Visit  Medication Sig  . benazepril (LOTENSIN) 10 MG tablet Take 1  tablet (10 mg total) by mouth daily.  . Cholecalciferol (VITAMIN D3) 2000 units capsule Take 2,000 Units by mouth every morning.  . clopidogrel (PLAVIX) 75 MG tablet TAKE 1 TABLET BY MOUTH EVERY DAY  . furosemide (LASIX) 20 MG tablet TAKE 1 TABLET BY MOUTH EVERY DAY  . isosorbide mononitrate (IMDUR) 30 MG 24 hr tablet TAKE 1 TAB DAILY. PLEASE MAKE OVERDUE APPT WITH DR. Curt Bears BEFORE ANYMORE REFILLS. 1ST ATTEMPT  . levothyroxine (SYNTHROID, LEVOTHROID) 50 MCG tablet Take 50 mcg by mouth daily before breakfast.  . metoprolol succinate (TOPROL-XL) 50 MG 24 hr tablet Take 1 tablet (50 mg total) by mouth daily. Take with or immediately following a meal.  . nitroGLYCERIN (NITROSTAT) 0.4 MG SL tablet Place 1 tablet (0.4 mg total) under the tongue every 5 (five) minutes as needed for chest pain.  . potassium chloride (K-DUR) 10 MEQ tablet Take 1 tablet (10 mEq total) by mouth daily.  . sotalol (BETAPACE) 120 MG tablet TAKE 1 TABLET (120 MG TOTAL) BY MOUTH 2 (TWO) TIMES DAILY.  Marland Kitchen warfarin (COUMADIN) 5 MG tablet TAKE AS DIRECTED BY COUMADIN CLINIC   No current facility-administered medications on file prior to visit.      Social Hx: She  reports that she has never smoked. She has never used smokeless tobacco. She reports that she does not drink alcohol or use drugs.    Diagnosis/Reason for Visit:   Lab draw  Services Requested:  Labs:  BNP  # of Visits Needed/Frequency per Week: 1

## 2018-08-09 ENCOUNTER — Telehealth: Payer: Medicare Other | Admitting: Cardiology

## 2018-08-10 LAB — PRO B NATRIURETIC PEPTIDE: NT-Pro BNP: 692 pg/mL — ABNORMAL HIGH (ref 0–301)

## 2018-08-14 NOTE — Progress Notes (Signed)
Reviewed for Richardson Dopp, PA  Patient's BNP already addressed.   Has upcoming studies with planned follow up.   Burtis Junes, RN, [place] [address] [place], [place]  [phone]

## 2018-08-16 ENCOUNTER — Ambulatory Visit: Payer: Medicare Other | Admitting: Cardiology

## 2018-08-20 ENCOUNTER — Telehealth (HOSPITAL_COMMUNITY): Payer: Self-pay

## 2018-08-20 NOTE — Telephone Encounter (Signed)
Spoke with the patient, gave her her instructions. She stated that she understood and would be here for her test. S.Dalani Mette EMTP

## 2018-08-23 ENCOUNTER — Ambulatory Visit (HOSPITAL_COMMUNITY): Payer: Medicare Other | Attending: Internal Medicine

## 2018-08-23 ENCOUNTER — Ambulatory Visit (HOSPITAL_COMMUNITY): Payer: Medicare Other

## 2018-08-23 ENCOUNTER — Other Ambulatory Visit: Payer: Self-pay

## 2018-08-23 VITALS — Ht 65.0 in | Wt 287.0 lb

## 2018-08-23 DIAGNOSIS — R110 Nausea: Secondary | ICD-10-CM | POA: Diagnosis present

## 2018-08-23 DIAGNOSIS — I5033 Acute on chronic diastolic (congestive) heart failure: Secondary | ICD-10-CM | POA: Insufficient documentation

## 2018-08-23 DIAGNOSIS — R0789 Other chest pain: Secondary | ICD-10-CM | POA: Insufficient documentation

## 2018-08-23 DIAGNOSIS — I2510 Atherosclerotic heart disease of native coronary artery without angina pectoris: Secondary | ICD-10-CM | POA: Insufficient documentation

## 2018-08-23 LAB — MYOCARDIAL PERFUSION IMAGING
LV dias vol: 90 mL (ref 46–106)
LV sys vol: 41 mL
Peak HR: 74 {beats}/min
Rest HR: 51 {beats}/min
SDS: 1
SRS: 0
SSS: 1
TID: 1.07

## 2018-08-23 MED ORDER — TECHNETIUM TC 99M TETROFOSMIN IV KIT
32.6000 | PACK | Freq: Once | INTRAVENOUS | Status: AC | PRN
  Administered 2018-08-23: 32.6 via INTRAVENOUS
  Filled 2018-08-23: qty 33

## 2018-08-23 MED ORDER — AMINOPHYLLINE 25 MG/ML IV SOLN
75.0000 mg | Freq: Once | INTRAVENOUS | Status: AC
  Administered 2018-08-23: 75 mg via INTRAVENOUS

## 2018-08-23 MED ORDER — REGADENOSON 0.4 MG/5ML IV SOLN
0.4000 mg | Freq: Once | INTRAVENOUS | Status: AC
  Administered 2018-08-23: 0.4 mg via INTRAVENOUS

## 2018-08-23 MED ORDER — TECHNETIUM TC 99M TETROFOSMIN IV KIT
10.9000 | PACK | Freq: Once | INTRAVENOUS | Status: AC | PRN
  Administered 2018-08-23: 10.9 via INTRAVENOUS
  Filled 2018-08-23: qty 11

## 2018-08-24 ENCOUNTER — Other Ambulatory Visit: Payer: Self-pay | Admitting: *Deleted

## 2018-08-24 ENCOUNTER — Encounter: Payer: Self-pay | Admitting: Physician Assistant

## 2018-08-24 DIAGNOSIS — I5033 Acute on chronic diastolic (congestive) heart failure: Secondary | ICD-10-CM

## 2018-08-24 MED ORDER — FUROSEMIDE 20 MG PO TABS: 40.0000 mg | ORAL_TABLET | Freq: Every day | ORAL | 3 refills | Status: DC

## 2018-08-24 MED ORDER — POTASSIUM CHLORIDE ER 10 MEQ PO TBCR: 10.0000 meq | EXTENDED_RELEASE_TABLET | Freq: Every day | ORAL | 3 refills | Status: DC

## 2018-08-28 ENCOUNTER — Telehealth: Payer: Self-pay | Admitting: Pharmacist

## 2018-08-28 NOTE — Progress Notes (Signed)
CARDIOLOGY OFFICE NOTE  Date:  08/29/2018    [name] Date of Birth: 1945-09-08 Medical Record #[id number]  PCP:  Ariel Cara, PA-C  Cardiologist:  Charlyne Mom   Chief Complaint  Patient presents with  . Follow-up    Seen for Dr. Charlyne Mom    History of Present Illness: [name] is a 73 y.o. female who presents today for a 3 week check. Seen for Dr. Marlou Porch and Dr. Curt Bears.   She has a history of CAD with DES to LAD in 2017, persistent AF with prior cardioversion in 2007 - recurrence in 2016 and started Tikosyn but was not covered and was switched to Sotalol with repeat cardioversion, bradycardia requiring reduction of metoprolol, dilated CM with EF as low as 25 to 30% in 2016 that recovered when in NSR - now 55 to 78%, diastolic HF, DM, HTN, HLD and obesity.   [name] was last seen by Dr. Curt Bears in 07/2018.  Her beta-blocker was reduced at that visit due to bradycardia.    She was then seen by Richardson Dopp, PA earlier this month for dyspnea and chest pain - had felt poorly the month prior. Having orthopnea and PND with more lower extremity edema along with chest pain and was worried about recurrent "blockage".   The patient does not have symptoms concerning for COVID-19 infection (fever, chills, cough, or new shortness of breath).   Comes in today. Here with her grand son today. She says she is doing ok. Tells me she "always feels bad". Seems pretty sedentary. Her breathing is better. Weight is down a few pounds. Likes diet soda. Eating take out. No chest pain. Remains on Plavix and Coumadin. NTG refilled today. She is anxious to go home. No falls. No bleeding noted.   Past Medical History:  Diagnosis Date  . CAD (coronary artery disease)    a. LHC 3/17: mLAD 80, D1 50, pRCA 30, EF 25-35%; PCI: 3.5 x 16 mm Synergy DES to mLAD // Myoview 08/2018: EF 54, normal perfusion; Low Risk  . Chronic diastolic CHF 5/88/5027   Echocardiogram  08/2018: EF 55-60, mild septal basal hypertrophy, Gr 2 DD, elevated LVEDP, mild LAE, mild AI  . Dilated cardiomyopathy >> EF returned to normal in NSR    Probable Tachycardia Mediated // a. Echo 12/16: Mild concentric LVH, EF 25-30%, anteroseptal akinesis, inferoseptal akinesis, anterior akinesis, trivial AI, mild MR, moderate LAE, trivial TR, trivial PI, PASP 33 mmHg  //  b. Echo 2/17: Mild LVH, EF 25-30%, diffuse HK, mild LAE  //  c. Echo 7/17: mild LVH, EF 50-55%, no RWMA, trivial AI, mild to mod LAE, mild RAE  . Febrile seizure ([place]) 2004   "during TTP thing; cause my temp was 106"  . High cholesterol   . History of blood transfusion 2004   "several; related to TTP"  . Hypertension   . Hypothyroidism   . Morbid obesity ([place]) 01/07/2013  . Osteoarthritis of back   . Paroxysmal atrial fibrillation ([place]) 08/01/2011   a. started on Sotalol during 10/2015 admission (Tikosyn too expensive)  . Pneumonia ?1999  . Scoliosis   . Thyroid nodule    "grew back after 2 partial thyroid surgeries" (09/29/2015)  . TTP (thrombotic thrombocytopenic purpura) ([place]) 05/03/2011    Past Surgical History:  Procedure Laterality Date  . ABDOMINAL EXPLORATION SURGERY     "opened me up 2-3 times for blocked intestines"  . APPENDECTOMY    . BACK  SURGERY    . BASAL CELL CARCINOMA EXCISION Right    "neck"  . CARDIAC CATHETERIZATION N/A 04/17/2015   Procedure: Left Heart Cath and Coronary Angiography;  Surgeon: Jettie Booze, MD;  Location: [facility];  Service: Cardiovascular;  Laterality: N/A;  . CARDIAC CATHETERIZATION  04/17/2015   Procedure: Coronary Stent Intervention;  Surgeon: Jettie Booze, MD;  Location: [facility];  Service: Cardiovascular;;  . CARDIOVERSION  06/2002; 03/2005; 05/2005   Archie Endo 06/16/2010  . CARDIOVERSION N/A 10/03/2015   Procedure: CARDIOVERSION;  Surgeon: Deboraha Sprang, MD;  Location: [facility];  Service: Cardiovascular;  Laterality: N/A;  . CATARACT EXTRACTION W/  INTRAOCULAR LENS  IMPLANT, BILATERAL Bilateral   . CHOLECYSTECTOMY OPEN    . CORONARY ANGIOPLASTY    . DILATION AND CURETTAGE OF UTERUS     S/P "miscarriage"  . FOOT SURGERY Right    "for nerve damage"  . SALIVARY STONE REMOVAL Left    "had tumor wrapped around it"  . SPLENECTOMY, TOTAL     Archie Endo 06/16/2010  . TEE WITHOUT CARDIOVERSION N/A 09/30/2015   Procedure: TRANSESOPHAGEAL ECHOCARDIOGRAM (TEE);  Surgeon: Skeet Latch, MD;  Location: [facility];  Service: Cardiovascular;  Laterality: N/A;  . THORACIC DISCECTOMY  05/2002  . THYROIDECTOMY, PARTIAL  X 2   "grew back"  . TONSILLECTOMY    . TUBAL LIGATION    . TUMOR EXCISION     "benign tumor in my neck"  . VAGINAL HYSTERECTOMY       Medications: Current Meds  Medication Sig  . benazepril (LOTENSIN) 10 MG tablet Take 1 tablet (10 mg total) by mouth daily.  . Cholecalciferol (VITAMIN D3) 2000 units capsule Take 2,000 Units by mouth every morning.  . clopidogrel (PLAVIX) 75 MG tablet TAKE 1 TABLET BY MOUTH EVERY DAY  . furosemide (LASIX) 20 MG tablet Take 2 tablets (40 mg total) by mouth daily.  . isosorbide mononitrate (IMDUR) 30 MG 24 hr tablet TAKE 1 TAB DAILY. PLEASE MAKE OVERDUE APPT WITH DR. Curt Bears BEFORE ANYMORE REFILLS. 1ST ATTEMPT  . levothyroxine (SYNTHROID, LEVOTHROID) 50 MCG tablet Take 50 mcg by mouth daily before breakfast.  . metoprolol succinate (TOPROL-XL) 50 MG 24 hr tablet Take 1 tablet (50 mg total) by mouth daily. Take with or immediately following a meal.  . nitroGLYCERIN (NITROSTAT) 0.4 MG SL tablet Place 1 tablet (0.4 mg total) under the tongue every 5 (five) minutes as needed for chest pain.  . potassium chloride (K-DUR) 10 MEQ tablet Take 1 tablet (10 mEq total) by mouth daily.  . sotalol (BETAPACE) 120 MG tablet TAKE 1 TABLET (120 MG TOTAL) BY MOUTH 2 (TWO) TIMES DAILY.  Marland Kitchen warfarin (COUMADIN) 5 MG tablet TAKE AS DIRECTED BY COUMADIN CLINIC  . [DISCONTINUED] nitroGLYCERIN (NITROSTAT) 0.4 MG SL  tablet Place 1 tablet (0.4 mg total) under the tongue every 5 (five) minutes as needed for chest pain.     Allergies: Allergies  Allergen Reactions  . Ceftriaxone Rash and Other (See Comments)    "TTP"/Was hospitalized and in a coma for 60 days, per patient (Thrombotic Thrombocytopenic Purpura)  . Penicillins Hives, Rash and Other (See Comments)    Cannot tolerate any "-CILLINS"; causes welts!! Has patient had a PCN reaction causing immediate rash, facial/tongue/throat swelling, SOB or lightheadedness with hypotension: Yes Has patient had a PCN reaction causing severe rash involving mucus membranes or skin necrosis: No Has patient had a PCN reaction that required hospitalization No Has patient had a PCN  reaction occurring within the last 10 years: No If all of the above answers are "NO", then may proceed with Cephalosporin use.  . Levofloxacin Other (See Comments)    Thrombocytopenia    . Levofloxacin Other (See Comments)    Dropped blood platelets extremely low  . Oxycodone Itching  . Tape Other (See Comments)    Prefers paper or cloth tape Other reaction(s): Other (See Comments) Prefers paper or cloth tape Other reaction(s): Other (See Comments) Prefers paper or cloth tape  . Alendronate Rash  . Aspirin Other (See Comments)    Patient stated she is unable to take due to blood thinner   . Oxycodone-Acetaminophen Itching  . Penicillin G Rash    Social History: The patient  reports that she has never smoked. She has never used smokeless tobacco. She reports that she does not drink alcohol or use drugs.   Family History: The patient's family history includes Cancer in her brother and sister; Heart attack in her mother.   Review of Systems: Please see the history of present illness.   All other systems are reviewed and negative.   Physical Exam: VS:  BP 118/70   Pulse 66   Ht 5\' 5"  (1.651 m)   Wt 283 lb 12.8 oz (128.7 kg)   SpO2 97%   BMI 47.23 kg/m  .  BMI Body mass  index is 47.23 kg/m.  Wt Readings from Last 3 Encounters:  08/29/18 283 lb 12.8 oz (128.7 kg)  08/23/18 287 lb (130.2 kg)  08/08/18 287 lb (130.2 kg)    General: Pleasant. Morbidly obese. Looks older than her stated age. She is alert and in no acute distress.   HEENT: Normal.  Neck: Supple, no JVD, carotid bruits, or masses noted.  Cardiac: Regular rate and rhythm. No murmurs, rubs, or gallops. No edema.  Respiratory:  Lungs are clear to auscultation bilaterally with normal work of breathing.  GI: Soft and nontender.  MS: No deformity or atrophy. Gait not tested. She is in a wheelchair.  Skin: Warm and dry. Color is normal.  Neuro:  Strength and sensation are intact and no gross focal deficits noted.  Psych: Alert, appropriate and with normal affect.   LABORATORY DATA:  EKG:  EKG is not ordered today.  Lab Results  Component Value Date   WBC 8.0 10/18/2017   HGB 15.0 10/18/2017   HCT 44.8 10/18/2017   PLT 522 (H) 10/18/2017   GLUCOSE 108 (H) 07/31/2018   CHOL 178 09/23/2016   TRIG 90 09/23/2016   HDL 37 (L) 09/23/2016   LDLCALC 123 (H) 09/23/2016   ALT 29 07/05/2017   AST 35 07/05/2017   NA 142 07/31/2018   K 4.4 07/31/2018   CL 104 07/31/2018   CREATININE 0.99 07/31/2018   BUN 14 07/31/2018   CO2 27 07/31/2018   TSH 3.239 07/05/2017   INR 2.5 08/29/2018     BNP (last 3 results) No results for input(s): BNP in the last 8760 hours.  ProBNP (last 3 results) Recent Labs    08/09/18 1330  PROBNP 692*     Other Studies Reviewed Today:  Myoview Study Highlights 08/2018    Nuclear stress EF: 54%.  No T wave inversion was noted during stress.  There was no ST segment deviation noted during stress.  This is a low risk study.   Normal perfusion. LVEF 54% with normal wall motion. This is a low risk study.     ECHO IMPRESSIONS 08/2018  1. The left ventricle has normal systolic function, with an ejection fraction of 55-60%. The cavity size was  normal. Mild basal septal hypertrophy. Left ventricular diastolic Doppler parameters are consistent with pseudonormalization. Elevated left  ventricular end-diastolic pressure No evidence of left ventricular regional wall motion abnormalities.  2. The right ventricle has normal systolic function. The cavity was normal. There is no increase in right ventricular wall thickness.  3. Left atrial size was mildly dilated.  4. The aortic valve is tricuspid. Mild sclerosis of the aortic valve. Aortic valve regurgitation is mild by color flow Doppler.  5. The aorta is normal in size and structure.  LHC 04/17/15 LAD mid 80%, D1 50% RCA proximal 30% EF 25-35% PCI: STENT SYNERGY DES 3.5X16 to mLAD   Assessment/Plan:  1. Shortness of breath - stable echo results and low risk Myoview - Lasix was increased at her last visit - needs follow up lab. I suspect her symptoms are multifactorial in etiology (weight/diastolic HF and sedentary lifestyle).   2. Chronic diastolic CHF (congestive heart failure) (HCC) Echo is stable - needs salt restriction - encouraged her to not have as much take out - water is more preferred that her soda, etc.   3. Other chest pain - recent low risk Myoview - would favor medical management. This has not recurred.   4. Coronary artery disease involving native coronary artery of native heart without angina pectoris - History of DES to the LAD in March 2017. She remains on Plavix along with her Coumadin. Apparently statin intolerant due to myalgias.   5. Persistent atrial fibrillation She is maintaining normal sinus rhythm.  She remains on Warfarin and Sotalol. INR is good today. Needs surveillance labs.    6. Essential hypertension - BP looks good today.   7. Bradycardia Resolved on lower dose beta-blocker.    8. Morbid obesity    9. COVID-19 Education: The signs and symptoms of COVID-19 were discussed with the patient and how to seek care for testing (follow up with  PCP or arrange E-visit).  The importance of social distancing, staying at home, hand hygiene and wearing a mask when out in public were discussed today.  Current medicines are reviewed with the patient today.  The patient does not have concerns regarding medicines other than what has been noted above.  The following changes have been made:  See above.  Labs/ tests ordered today include:    Orders Placed This Encounter  Procedures  . Basic metabolic panel  . CBC     Disposition:   FU with Dr. Marlou Porch in about 4 months. Labs today.   Patient is agreeable to this plan and will call if any problems develop in the interim.   SignedTruitt Merle, NP  08/29/2018 3:56 PM  [facility] [address] [place], [place]  [address] Phone: [phone] Fax: [phone]

## 2018-08-28 NOTE — Telephone Encounter (Signed)
Left VM for COVID screen 

## 2018-08-29 ENCOUNTER — Ambulatory Visit (INDEPENDENT_AMBULATORY_CARE_PROVIDER_SITE_OTHER): Payer: Medicare Other

## 2018-08-29 ENCOUNTER — Other Ambulatory Visit: Payer: Self-pay

## 2018-08-29 ENCOUNTER — Other Ambulatory Visit: Payer: Medicare Other

## 2018-08-29 ENCOUNTER — Encounter: Payer: Self-pay | Admitting: Nurse Practitioner

## 2018-08-29 ENCOUNTER — Ambulatory Visit (INDEPENDENT_AMBULATORY_CARE_PROVIDER_SITE_OTHER): Payer: Medicare Other | Admitting: Nurse Practitioner

## 2018-08-29 VITALS — BP 118/70 | HR 66 | Ht 65.0 in | Wt 283.8 lb

## 2018-08-29 DIAGNOSIS — R0789 Other chest pain: Secondary | ICD-10-CM | POA: Diagnosis not present

## 2018-08-29 DIAGNOSIS — R0602 Shortness of breath: Secondary | ICD-10-CM

## 2018-08-29 DIAGNOSIS — I2510 Atherosclerotic heart disease of native coronary artery without angina pectoris: Secondary | ICD-10-CM

## 2018-08-29 DIAGNOSIS — I4891 Unspecified atrial fibrillation: Secondary | ICD-10-CM

## 2018-08-29 DIAGNOSIS — Z79899 Other long term (current) drug therapy: Secondary | ICD-10-CM

## 2018-08-29 DIAGNOSIS — Z5181 Encounter for therapeutic drug level monitoring: Secondary | ICD-10-CM | POA: Diagnosis not present

## 2018-08-29 DIAGNOSIS — I10 Essential (primary) hypertension: Secondary | ICD-10-CM

## 2018-08-29 DIAGNOSIS — I5032 Chronic diastolic (congestive) heart failure: Secondary | ICD-10-CM

## 2018-08-29 LAB — POCT INR: INR: 2.5 (ref 2.0–3.0)

## 2018-08-29 MED ORDER — NITROGLYCERIN 0.4 MG SL SUBL: 0.4000 mg | SUBLINGUAL_TABLET | SUBLINGUAL | 3 refills | Status: DC | PRN

## 2018-08-29 NOTE — Patient Instructions (Signed)
Description   Continue taking Coumadin 5mg  (1 tablet) daily except 7.5 mg (1.5 tablets) on Sundays and Wednesdays. Recheck INR 4 weeks. Call us with any new medications or concerns [phone].

## 2018-08-29 NOTE — Patient Instructions (Addendum)
After Visit Summary:  We will be checking the following labs today - BMET and CBC   Medication Instructions:    Continue with your current medicines.   We have refilled the NTG today   If you need a refill on your cardiac medications before your next appointment, please call your pharmacy.     Testing/Procedures To Be Arranged:  N/A  Follow-Up:   See Dr. Marlou Porch in 4 months     At [facility], you and your health needs are our priority.  As part of our continuing mission to provide you with exceptional heart care, we have created designated Provider Care Teams.  These Care Teams include your primary Cardiologist (physician) and Advanced Practice Providers (APPs -  Physician Assistants and Nurse Practitioners) who all work together to provide you with the care you need, when you need it.  Special Instructions:  . Stay safe, stay home, wash your hands for at least 20 seconds and wear a mask when out in public.  . It was good to talk with you today.    Call the [place] office at [phone] if you have any questions, problems or concerns.

## 2018-08-30 LAB — BASIC METABOLIC PANEL
BUN/Creatinine Ratio: 16 (ref 12–28)
BUN: 15 mg/dL (ref 8–27)
CO2: 26 mmol/L (ref 20–29)
Calcium: 8.6 mg/dL — ABNORMAL LOW (ref 8.7–10.3)
Chloride: 101 mmol/L (ref 96–106)
Creatinine, Ser: 0.92 mg/dL (ref 0.57–1.00)
GFR calc Af Amer: 71 mL/min/{1.73_m2} (ref >59)
GFR calc non Af Amer: 62 mL/min/{1.73_m2} (ref >59)
Glucose: 104 mg/dL — ABNORMAL HIGH (ref 65–99)
Potassium: 4.2 mmol/L (ref 3.5–5.2)
Sodium: 142 mmol/L (ref 134–144)

## 2018-08-30 LAB — CBC
Hematocrit: 42.6 % (ref 34.0–46.6)
Hemoglobin: 14.2 g/dL (ref 11.1–15.9)
MCH: 29.5 pg (ref 26.6–33.0)
MCHC: 33.3 g/dL (ref 31.5–35.7)
MCV: 89 fL (ref 79–97)
Platelets: 593 10*3/uL — ABNORMAL HIGH (ref 150–450)
RBC: 4.81 x10E6/uL (ref 3.77–5.28)
RDW: 13.4 % (ref 11.7–15.4)
WBC: 8.6 10*3/uL (ref 3.4–10.8)

## 2018-09-03 ENCOUNTER — Other Ambulatory Visit: Payer: Self-pay | Admitting: *Deleted

## 2018-09-03 DIAGNOSIS — M3119 Other thrombotic microangiopathy: Secondary | ICD-10-CM

## 2018-09-03 DIAGNOSIS — M311 Thrombotic microangiopathy: Secondary | ICD-10-CM

## 2018-09-04 ENCOUNTER — Other Ambulatory Visit: Payer: Self-pay | Admitting: Cardiology

## 2018-09-24 ENCOUNTER — Other Ambulatory Visit: Payer: Self-pay | Admitting: Nurse Practitioner

## 2018-09-26 ENCOUNTER — Other Ambulatory Visit: Payer: Self-pay

## 2018-09-26 ENCOUNTER — Other Ambulatory Visit: Payer: Medicare Other

## 2018-09-26 ENCOUNTER — Ambulatory Visit (INDEPENDENT_AMBULATORY_CARE_PROVIDER_SITE_OTHER): Payer: Medicare Other | Admitting: *Deleted

## 2018-09-26 DIAGNOSIS — Z5181 Encounter for therapeutic drug level monitoring: Secondary | ICD-10-CM

## 2018-09-26 DIAGNOSIS — I4819 Other persistent atrial fibrillation: Secondary | ICD-10-CM

## 2018-09-26 LAB — POCT INR: INR: 2.6 (ref 2.0–3.0)

## 2018-09-26 NOTE — Patient Instructions (Signed)
Description   Continue taking Coumadin 5mg  (1 tablet) daily except 7.5 mg (1.5 tablets) on Sundays and Wednesdays. Recheck INR 5 weeks. Call us with any new medications or concerns [phone].

## 2018-10-07 ENCOUNTER — Other Ambulatory Visit: Payer: Self-pay | Admitting: Nurse Practitioner

## 2018-11-01 ENCOUNTER — Other Ambulatory Visit: Payer: Self-pay

## 2018-11-01 ENCOUNTER — Ambulatory Visit (INDEPENDENT_AMBULATORY_CARE_PROVIDER_SITE_OTHER): Payer: Medicare Other | Admitting: *Deleted

## 2018-11-01 ENCOUNTER — Other Ambulatory Visit: Payer: Medicare Other

## 2018-11-01 DIAGNOSIS — I4819 Other persistent atrial fibrillation: Secondary | ICD-10-CM

## 2018-11-01 DIAGNOSIS — Z5181 Encounter for therapeutic drug level monitoring: Secondary | ICD-10-CM | POA: Diagnosis not present

## 2018-11-01 LAB — POCT INR: INR: 2.2 (ref 2.0–3.0)

## 2018-11-01 NOTE — Patient Instructions (Addendum)
Description   Continue taking Coumadin 5mg  (1 tablet) daily except 7.5 mg (1.5 tablets) on Tuesdays and Fridays. Recheck INR 6 weeks. Call us with any new medications or concerns [phone].

## 2018-12-06 ENCOUNTER — Other Ambulatory Visit: Payer: Self-pay | Admitting: Cardiology

## 2018-12-11 ENCOUNTER — Ambulatory Visit (INDEPENDENT_AMBULATORY_CARE_PROVIDER_SITE_OTHER): Payer: Medicare Other | Admitting: Cardiology

## 2018-12-11 ENCOUNTER — Encounter: Payer: Self-pay | Admitting: Cardiology

## 2018-12-11 ENCOUNTER — Encounter (INDEPENDENT_AMBULATORY_CARE_PROVIDER_SITE_OTHER): Payer: Self-pay

## 2018-12-11 ENCOUNTER — Other Ambulatory Visit: Payer: Self-pay

## 2018-12-11 ENCOUNTER — Ambulatory Visit (INDEPENDENT_AMBULATORY_CARE_PROVIDER_SITE_OTHER): Payer: Medicare Other

## 2018-12-11 VITALS — BP 128/80 | HR 62 | Ht 65.0 in | Wt 241.0 lb

## 2018-12-11 DIAGNOSIS — I4819 Other persistent atrial fibrillation: Secondary | ICD-10-CM | POA: Diagnosis not present

## 2018-12-11 DIAGNOSIS — I2510 Atherosclerotic heart disease of native coronary artery without angina pectoris: Secondary | ICD-10-CM | POA: Diagnosis not present

## 2018-12-11 DIAGNOSIS — I5032 Chronic diastolic (congestive) heart failure: Secondary | ICD-10-CM | POA: Diagnosis not present

## 2018-12-11 DIAGNOSIS — Z5181 Encounter for therapeutic drug level monitoring: Secondary | ICD-10-CM | POA: Diagnosis not present

## 2018-12-11 LAB — POCT INR: INR: 2.6 (ref 2.0–3.0)

## 2018-12-11 NOTE — Patient Instructions (Signed)
Medication Instructions:  Please discontinue your Plavix and Benazepril.  Continue all other medications as listed.  *If you need a refill on your cardiac medications before your next appointment, please call your pharmacy*  Follow-Up: At [facility], you and your health needs are our priority.  As part of our continuing mission to provide you with exceptional heart care, we have created designated Provider Care Teams.  These Care Teams include your primary Cardiologist (physician) and Advanced Practice Providers (APPs -  Physician Assistants and Nurse Practitioners) who all work together to provide you with the care you need, when you need it.  Your next appointment:   6 months  The format for your next appointment:   In Person  Provider:   Truitt Merle, NP   And Dr Marlou Porch in 1 year.  Thank you for choosing [facility]!!

## 2018-12-11 NOTE — Patient Instructions (Signed)
Description   Continue taking Coumadin 5mg  (1 tablet) daily except 7.5 mg (1.5 tablets) on Tuesdays and Fridays. Recheck INR 6 weeks. Call us with any new medications or concerns [phone].

## 2018-12-11 NOTE — Progress Notes (Signed)
[name] [address], [address], [place]  [address] Phone: [phone] Fax:  [phone]  Date:  12/11/2018   ID:  [name], DOB 10-Jul-1945, MRN [id number]  PCP:  Elisabeth Cara, PA-C   History of Present Illness: [name] is a 73 y.o. female with parox atrial fibrillation, now normal EF from [id number], prior diastolic dysfunction as presentation of atrial fibrillation, diabetes, obesity here for follow up.    Note, she had prior history of TTP. No further sequela currently although she does get occasional bruising, and Dr. Beryle Beams thought that she should probably be off of Plavix and she is on Coumadin.  Prior evaluation by Dr. Curt Bears, appreciate this.  On sotalol and Coumadin.  Did have some bradycardia and her metoprolol dose was reduced at one point.  Overall she seems to doing quite well.  Her weight is down.  Struggled this summer with diastolic heart failure.  No fevers chills nausea vomiting syncope bleeding.     Wt Readings from Last 3 Encounters:  12/11/18 241 lb (109.3 kg)  08/29/18 283 lb 12.8 oz (128.7 kg)  08/23/18 287 lb (130.2 kg)     Past Medical History:  Diagnosis Date  . CAD (coronary artery disease)    a. LHC 3/17: mLAD 80, D1 50, pRCA 30, EF 25-35%; PCI: 3.5 x 16 mm Synergy DES to mLAD // Myoview 08/2018: EF 54, normal perfusion; Low Risk  . Chronic diastolic CHF [id number]   Echocardiogram 08/2018: EF 55-60, mild septal basal hypertrophy, Gr 2 DD, elevated LVEDP, mild LAE, mild AI  . Dilated cardiomyopathy >> EF returned to normal in NSR    Probable Tachycardia Mediated // a. Echo 12/16: Mild concentric LVH, EF 25-30%, anteroseptal akinesis, inferoseptal akinesis, anterior akinesis, trivial AI, mild MR, moderate LAE, trivial TR, trivial PI, PASP 33 mmHg  //  b. Echo 2/17: Mild LVH, EF 25-30%, diffuse HK, mild LAE  //  c. Echo 7/17: mild LVH, EF 50-55%, no RWMA, trivial AI, mild to mod LAE, mild RAE  . Febrile seizure ([place])  2004   "during TTP thing; cause my temp was 106"  . High cholesterol   . History of blood transfusion 2004   "several; related to TTP"  . Hypertension   . Hypothyroidism   . Morbid obesity ([place]) 01/07/2013  . Osteoarthritis of back   . Paroxysmal atrial fibrillation ([place]) 08/01/2011   a. started on Sotalol during 10/2015 admission (Tikosyn too expensive)  . Pneumonia ?1999  . Scoliosis   . Thyroid nodule    "grew back after 2 partial thyroid surgeries" (09/29/2015)  . 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 ABDOMINAL EXPLORATION SURGERY     "opened me up 2-3 times for blocked intestines"  . APPENDECTOMY    . BACK SURGERY    . BASAL CELL CARCINOMA EXCISION Right    "neck"  . CARDIAC CATHETERIZATION N/A 04/17/2015   Procedure: Left Heart Cath and Coronary Angiography;  Surgeon: Jettie Booze, MD;  Location: [facility];  Service: Cardiovascular;  Laterality: N/A;  . CARDIAC CATHETERIZATION  04/17/2015   Procedure: Coronary Stent Intervention;  Surgeon: Jettie Booze, MD;  Location: [facility];  Service: Cardiovascular;;  . CARDIOVERSION  06/2002; 03/2005; 05/2005   Archie Endo 06/16/2010  . CARDIOVERSION N/A 10/03/2015   Procedure: CARDIOVERSION;  Surgeon: Deboraha Sprang, MD;  Location: [facility];  Service: Cardiovascular;  Laterality: N/A;  . CATARACT EXTRACTION W/ INTRAOCULAR LENS  IMPLANT, BILATERAL Bilateral   . CHOLECYSTECTOMY OPEN    . CORONARY ANGIOPLASTY    . DILATION AND CURETTAGE OF UTERUS     S/P "miscarriage"  . FOOT SURGERY Right    "for nerve damage"  . SALIVARY STONE REMOVAL Left    "had tumor wrapped around it"  . SPLENECTOMY, TOTAL     Archie Endo 06/16/2010  . TEE WITHOUT CARDIOVERSION N/A 09/30/2015   Procedure: TRANSESOPHAGEAL ECHOCARDIOGRAM (TEE);  Surgeon: Skeet Latch, MD;  Location: [facility];  Service: Cardiovascular;  Laterality: N/A;  . THORACIC DISCECTOMY  05/2002   . THYROIDECTOMY, PARTIAL  X 2   "grew back"  . TONSILLECTOMY    . TUBAL LIGATION    . TUMOR EXCISION     "benign tumor in my neck"  . VAGINAL HYSTERECTOMY      Current Outpatient Medications  Medication Sig Dispense Refill  . Cholecalciferol (VITAMIN D3) 2000 units capsule Take 2,000 Units by mouth every morning.    . furosemide (LASIX) 20 MG tablet Take 2 tablets (40 mg total) by mouth daily. 90 tablet 3  . isosorbide mononitrate (IMDUR) 30 MG 24 hr tablet TAKE 1 TAB DAILY. PLEASE MAKE OVERDUE APPT WITH DR. Curt Bears BEFORE ANYMORE REFILLS. 1ST ATTEMPT 90 tablet 3  . levothyroxine (SYNTHROID, LEVOTHROID) 50 MCG tablet Take 50 mcg by mouth daily before breakfast.    . metoprolol succinate (TOPROL-XL) 50 MG 24 hr tablet Take 1 tablet (50 mg total) by mouth daily. Take with or immediately following a meal. 90 tablet 2  . nitroGLYCERIN (NITROSTAT) 0.4 MG SL tablet PLACE 1 TABLET (0.4 MG TOTAL) UNDER THE TONGUE EVERY 5 (FIVE) MINUTES AS NEEDED FOR CHEST PAIN. 25 tablet 3  . potassium chloride (K-DUR) 10 MEQ tablet Take 1 tablet (10 mEq total) by mouth daily. 90 tablet 3  . sotalol (BETAPACE) 120 MG tablet TAKE 1 TABLET (120 MG TOTAL) BY MOUTH 2 (TWO) TIMES DAILY. 180 tablet 2  . warfarin (COUMADIN) 5 MG tablet TAKE AS DIRECTED BY COUMADIN CLINIC 120 tablet 1   No current facility-administered medications for this visit.     Allergies:    Allergies  Allergen Reactions  . Ceftriaxone Rash and Other (See Comments)    "TTP"/Was hospitalized and in a coma for 60 days, per patient (Thrombotic Thrombocytopenic Purpura)  . Penicillins Hives, Rash and Other (See Comments)    Cannot tolerate any "-CILLINS"; causes welts!! Has patient had a PCN reaction causing immediate rash, facial/tongue/throat swelling, SOB or lightheadedness with hypotension: Yes Has patient had a PCN reaction causing severe rash involving mucus membranes or skin necrosis: No Has patient had a PCN reaction that required  hospitalization No Has patient had a PCN reaction occurring within the last 10 years: No If all of the above answers are "NO", then may proceed with Cephalosporin use.  . Levofloxacin Other (See Comments)    Thrombocytopenia    . Levofloxacin Other (See Comments)    Dropped blood platelets extremely low  . Oxycodone Itching  . Tape Other (See Comments)    Prefers paper or cloth tape Other reaction(s): Other (See Comments) Prefers paper or cloth tape Other reaction(s): Other (See Comments) Prefers paper or cloth tape  . Alendronate Rash  . Aspirin Other (See Comments)    Patient stated she is unable to take due to blood thinner   . Oxycodone-Acetaminophen Itching  . Penicillin G Rash    Social  History:  The patient  reports that she has never smoked. She has never used smokeless tobacco. She reports that she does not drink alcohol or use drugs.   ROS:  Please see the history of present illness.     PHYSICAL EXAM: VS:  BP 128/80   Pulse 62   Ht 5\' 5"  (1.651 m)   Wt 241 lb (109.3 kg)   SpO2 97%   BMI 40.10 kg/m  GEN: Well nourished, well developed, in no acute distress  HEENT: normal  Neck: no JVD, carotid bruits, or masses Cardiac: reg; no murmurs, rubs, or gallops,no edema  Respiratory:  clear to auscultation bilaterally, normal work of breathing GI: soft, nontender, nondistended, + BS MS: no deformity or atrophy  Skin: warm and dry, no rash Neuro:  Alert and Oriented x 3, Strength and sensation are intact Psych: euthymic mood, full affect   EKG: 03/18/15-atrial fib heart rate 74 bpm, rightward axis. Much improved rate.  ECHO: 03/31/15: - Left ventricle: The cavity size was moderately dilated. Wall  thickness was increased in a pattern of mild LVH. Systolic  function was severely reduced. The estimated ejection fraction  was in the range of 25% to 30%. Diffuse hypokinesis. - Left atrium: The atrium was mildly dilated. - Atrial septum: No defect or patent foramen  ovale was identified.  Echocardiogram 08/23/2018: The echocardiogram shows normal heart function (EF 55-60%) and moderate stiffness during relaxation (diastolic dysfunction). There is still evidence of elevated pressure in the heart. There is mild leakage of the aortic valve.    Cath 04/17/15:  1st Diag lesion, 50% stenosed.  Mid LAD lesion, 80% stenosed. Post intervention with a 3.5 x 16 Synergy drug eluting stent, there is a 0% residual stenosis.  There is moderate left ventricular systolic dysfunction.  She'll need clopidogrel along with her warfarin for at least several months. Ideally, she could have at least 6 months of antiplatelet therapy. I did not start aspirin because she has refused it earlier today. We'll restart Coumadin. Continue Angiomax for another hour or so. She'll be watched overnight with plan for discharge tomorrow.   ASSESSMENT AND PLAN:  1. Paroxysmal atrial fibrillation -doing very well with the sotalol and Toprol.  No changes made.  She will be following up with Dr. Curt Bears. 2. Prior systolic heart failure-EF has now normalized.  Excellent.  May have been related in part to atrial fibrillation.  Cath showed mid LAD 80% lesion stented with DES, 1st diag 50%.  Since it has been 3 years, I will stop her Plavix.  Continuing Coumadin. 3. Morbid obesity-encourage weight loss. She has lost weight in the past. I encouraged decreasing carbohydrates. 4. Essential hypertension-Well controlled today.  5. Chronic anticoagulation with Coumadin-we are watching this here in our clinic. On Plavix as well because of recent stent placement. 6. Chronic systolic heart failure/cardiomyopathy-as above beta blocker.  I am going to stop her ACE inhibitor benazepril because of recent episodes of hypotension especially during office procedures such as dermatologic procedures where her blood pressure has plummeted, likely vagal mediated.  If she does have another procedure, it is probably not a  bad idea for her to hold her metoprolol that morning and the isosorbide as well as Lasix.  Maintaining weight.     Signed, [name], MD [facility]  12/11/2018 5:31 PM

## 2019-01-12 ENCOUNTER — Other Ambulatory Visit: Payer: Self-pay | Admitting: Cardiology

## 2019-01-14 NOTE — Telephone Encounter (Signed)
Patient Instructions by Shellia Cleverly, RN at 12/11/2018 4:00 PM Author: Shellia Cleverly, RN Author Type: Registered Nurse Filed: 12/11/2018  5:00 PM  Note Status: Signed Cosign: Cosign Not Required Encounter Date: 12/11/2018  Editor: Shellia Cleverly, RN (Registered Nurse)    Medication Instructions:  Please discontinue your Plavix and Benazepril.  Continue all other medications as listed.

## 2019-01-29 ENCOUNTER — Ambulatory Visit (INDEPENDENT_AMBULATORY_CARE_PROVIDER_SITE_OTHER): Payer: Medicare Other | Admitting: *Deleted

## 2019-01-29 ENCOUNTER — Other Ambulatory Visit: Payer: Self-pay

## 2019-01-29 DIAGNOSIS — Z5181 Encounter for therapeutic drug level monitoring: Secondary | ICD-10-CM | POA: Diagnosis not present

## 2019-01-29 DIAGNOSIS — I4819 Other persistent atrial fibrillation: Secondary | ICD-10-CM | POA: Diagnosis not present

## 2019-01-29 DIAGNOSIS — Z23 Encounter for immunization: Secondary | ICD-10-CM

## 2019-01-29 LAB — POCT INR: INR: 2.6 (ref 2.0–3.0)

## 2019-01-29 NOTE — Patient Instructions (Signed)
Description    Continue taking Coumadin 5mg (1 tablet) daily except 7.5 mg (1.5 tablets) on Tuesdays and Fridays. Recheck INR 7 weeks. Call us with any new medications or concerns #[phone].     

## 2019-02-26 ENCOUNTER — Ambulatory Visit: Payer: Medicare Other | Admitting: Cardiology

## 2019-03-19 ENCOUNTER — Other Ambulatory Visit: Payer: Self-pay

## 2019-03-19 ENCOUNTER — Encounter (INDEPENDENT_AMBULATORY_CARE_PROVIDER_SITE_OTHER): Payer: Self-pay

## 2019-03-19 ENCOUNTER — Ambulatory Visit (INDEPENDENT_AMBULATORY_CARE_PROVIDER_SITE_OTHER): Payer: Medicare Other | Admitting: *Deleted

## 2019-03-19 DIAGNOSIS — I4819 Other persistent atrial fibrillation: Secondary | ICD-10-CM

## 2019-03-19 DIAGNOSIS — Z5181 Encounter for therapeutic drug level monitoring: Secondary | ICD-10-CM | POA: Diagnosis not present

## 2019-03-19 LAB — POCT INR: INR: 2 (ref 2.0–3.0)

## 2019-03-19 NOTE — Patient Instructions (Signed)
Description    Continue taking Coumadin 5mg (1 tablet) daily except 7.5 mg (1.5 tablets) on Tuesdays and Fridays. Recheck INR 7 weeks. Call us with any new medications or concerns #[phone].     

## 2019-04-10 ENCOUNTER — Other Ambulatory Visit: Payer: Self-pay | Admitting: Cardiology

## 2019-05-03 ENCOUNTER — Ambulatory Visit (INDEPENDENT_AMBULATORY_CARE_PROVIDER_SITE_OTHER): Payer: Medicare Other | Admitting: Pharmacist

## 2019-05-03 ENCOUNTER — Other Ambulatory Visit: Payer: Self-pay

## 2019-05-03 DIAGNOSIS — I4819 Other persistent atrial fibrillation: Secondary | ICD-10-CM | POA: Diagnosis not present

## 2019-05-03 DIAGNOSIS — Z5181 Encounter for therapeutic drug level monitoring: Secondary | ICD-10-CM

## 2019-05-03 LAB — POCT INR: INR: 2.2 (ref 2.0–3.0)

## 2019-05-03 NOTE — Patient Instructions (Signed)
Continue taking Coumadin 5mg  (1 tablet) daily except 7.5 mg (1.5 tablets) on Tuesdays and Fridays. Recheck INR 8 weeks. Call us with any new medications or concerns [phone].

## 2019-06-13 ENCOUNTER — Other Ambulatory Visit: Payer: Self-pay | Admitting: *Deleted

## 2019-06-13 MED ORDER — WARFARIN SODIUM 5 MG PO TABS: ORAL_TABLET | ORAL | 1 refills | Status: DC

## 2019-06-28 ENCOUNTER — Other Ambulatory Visit: Payer: Self-pay

## 2019-06-28 ENCOUNTER — Ambulatory Visit (INDEPENDENT_AMBULATORY_CARE_PROVIDER_SITE_OTHER): Payer: Medicare Other | Admitting: Pharmacist

## 2019-06-28 DIAGNOSIS — I4819 Other persistent atrial fibrillation: Secondary | ICD-10-CM

## 2019-06-28 DIAGNOSIS — Z5181 Encounter for therapeutic drug level monitoring: Secondary | ICD-10-CM | POA: Diagnosis not present

## 2019-06-28 LAB — POCT INR: INR: 1.9 — AB (ref 2.0–3.0)

## 2019-06-28 NOTE — Patient Instructions (Signed)
Description   Take 1.5 tablets today, then continue taking Coumadin 5mg  (1 tablet) daily except 7.5 mg (1.5 tablets) on Tuesdays and Fridays. Recheck INR 6 weeks. Call us with any new medications or concerns [phone].

## 2019-08-02 ENCOUNTER — Telehealth: Payer: Self-pay | Admitting: Pharmacist

## 2019-08-02 MED ORDER — ISOSORBIDE MONONITRATE ER 30 MG PO TB24: ORAL_TABLET | ORAL | 1 refills | Status: DC

## 2019-08-02 NOTE — Telephone Encounter (Signed)
Requesting refill on isosorbide 30mg 

## 2019-08-02 NOTE — Telephone Encounter (Signed)
Pt's medication was sent to pt's pharmacy as requested. Confirmation received.  °

## 2019-08-09 ENCOUNTER — Other Ambulatory Visit: Payer: Self-pay

## 2019-08-09 ENCOUNTER — Ambulatory Visit (INDEPENDENT_AMBULATORY_CARE_PROVIDER_SITE_OTHER): Payer: Medicare Other

## 2019-08-09 ENCOUNTER — Telehealth: Payer: Self-pay | Admitting: *Deleted

## 2019-08-09 DIAGNOSIS — I4819 Other persistent atrial fibrillation: Secondary | ICD-10-CM

## 2019-08-09 DIAGNOSIS — Z5181 Encounter for therapeutic drug level monitoring: Secondary | ICD-10-CM | POA: Diagnosis not present

## 2019-08-09 LAB — POCT INR: INR: 2 (ref 2.0–3.0)

## 2019-08-09 NOTE — Patient Instructions (Signed)
Continue taking Coumadin 5mg  (1 tablet) daily except 7.5 mg (1.5 tablets) on Tuesdays and Fridays. Recheck INR 7 weeks. Call us with any new medications or concerns [phone].

## 2019-08-09 NOTE — Telephone Encounter (Signed)
Pt here today for a coumadin clinic appt and c/o bilateral edema that she has had for 2 months.  Pt states nothing she seems to do helps with the swelling. She does not weigh daily. Denies any other complaints.  Reports having to get up frequently during the night to urinate but edema never seems to improve.  Advised pt to increase her present dose of Furosemide to 2 tablets daily for 3 days, elevate feet/legs above the level of her heart as much as possible.  Suggested knee high compression stockings but pt states she can not wear them.  Reviewed dietary limitations including no more than 1,500 mg of sodium per day.  Pt and grandson state understanding.  Pt is requesting the next available appt.  appt scheduled with Richardson Dopp, PA 7/14 at 11:15 am.  Advised to call back prior to then if no improvement in edema.   Left message of appt date and time on pt's VM at home # as requested.  Advised pt if she is unable to make this appt to please c/b to reschedule.

## 2019-08-11 ENCOUNTER — Other Ambulatory Visit: Payer: Self-pay | Admitting: Cardiology

## 2019-08-13 NOTE — Progress Notes (Signed)
Cardiology Office Note:    Date:  08/14/2019   ID:  MYCALA WARSHAWSKY, DOB [birth year], MRN [id number]  PCP:  Elisabeth Cara, PA-C  Cardiologist:  [name], MD   Electrophysiologist:  Constance Haw, MD   Referring MD: Elisabeth Cara, *   Chief Complaint:  Leg Swelling    Patient Profile:    [name] is a 74 y.o. female with:   Coronary artery disease ? S/p DES to LAD in 04/2015  Persistent AFib ? S/p DCCV in 2007 ? Recurrent AFib in 2016  ? Tikosyn not covered by insurance >> Sotalol started in 2017 >> DCCV >> NSR ? CHADS2-VASc=5 (age, Diabetes, HTN, CHF, CAD) >> Coumadin ? Bradycardia: Metoprolol reduced in 07/2018  Dilated CM ? EF 25-30 in 2016 >> improved to normal in sinus rhythm ? Echo 8/18: EF 55-60, Gr 2 diast dysfn  Chronic Diastolic CHF   Diabetes mellitus  Hypertension  Hyperlipidemia  Hx of TTP in 2004  Obesity  Prior CV studies: Echocardiogram 08/23/2018 EF 55-60, Gr 2 DD, normal RVSF, mild LAE, mild AI  Myoview 08/23/2018 EF 54, normal perfusion, low risk   Echocardiogram 09/24/2016 Mild concentric LVH, EF 55-60, normal wall motion, grade 2 diastolic dysfunction, mildly calcified aortic valve leaflets, trivial AI, trivial MR, mild LAE (AP dimension 45), trivial PI, trivial pericardial effusion  Transesophageal echocardiogram 09/30/2015 EF 50-55, normal wall motion, mild AI, mild MR, no evidence of thrombus in LA/RA or LAA  Echocardiogram 08/21/2015 Mild concentric LVH, EF 50-55, normal wall motion, trivial AI, mild to moderate LAE, mild RAE  LHC 04/17/15 LAD mid 80%, D1 50% RCA proximal 30% EF 25-35% PCI: STENT SYNERGY DES 3.5X16 to mLAD  Echo 03/31/15 Mild LVH, EF 25-30%, diffuse HK, mild LAE  Echo 01/06/15 Mild concentric LVH, EF 25-30%, anteroseptal akinesis, inferoseptal akinesis, anterior akinesis, trivial AI, mild MR, moderate LAE, trivial TR, trivial PI, PASP 33 mmHg  History of Present Illness:      [name] was last seen by Dr. Marlou Porch in 12/2018.  Her ACE inhibitor was DCd due to episodes of low BP.  Her Clopidogrel was DCd due since it had been 3 years since her PCI.  She was seen by the RN for symptoms of leg swelling 08/09/19 while her for INR check. Her furosemide was increased for 3 days and she returns for evaluation.  She is here with her son.  She has noted increasing swelling and shortness of breath over the past few weeks. She sleeps on an incline.  She has not had chest pain, syncope.  She does not weigh herself at home.  She is fairly sedentary due to prior back issues.    Past Medical History:  Diagnosis Date  . CAD (coronary artery disease)    a. LHC 3/17: mLAD 80, D1 50, pRCA 30, EF 25-35%; PCI: 3.5 x 16 mm Synergy DES to mLAD // Myoview 08/2018: EF 54, normal perfusion; Low Risk  . Chronic diastolic CHF 02/08/3233   Echocardiogram 08/2018: EF 55-60, mild septal basal hypertrophy, Gr 2 DD, elevated LVEDP, mild LAE, mild AI  . Dilated cardiomyopathy >> EF returned to normal in NSR    Probable Tachycardia Mediated // a. Echo 12/16: Mild concentric LVH, EF 25-30%, anteroseptal akinesis, inferoseptal akinesis, anterior akinesis, trivial AI, mild MR, moderate LAE, trivial TR, trivial PI, PASP 33 mmHg  //  b. Echo 2/17: Mild LVH, EF 25-30%, diffuse HK, mild LAE  //  c. Echo 7/17: mild  LVH, EF 50-55%, no RWMA, trivial AI, mild to mod LAE, mild RAE  . Febrile seizure ([place]) 2004   "during TTP thing; cause my temp was 106"  . High cholesterol   . History of blood transfusion 2004   "several; related to TTP"  . Hypertension   . Hypothyroidism   . Morbid obesity ([place]) 01/07/2013  . Osteoarthritis of back   . Paroxysmal atrial fibrillation ([place]) 08/01/2011   a. started on Sotalol during 10/2015 admission (Tikosyn too expensive)  . Pneumonia ?1999  . Scoliosis   . Thyroid nodule    "grew back after 2 partial thyroid surgeries" (09/29/2015)  . TTP (thrombotic thrombocytopenic purpura)  ([place]) 05/03/2011    Current Medications: Current Meds  Medication Sig  . Cholecalciferol (VITAMIN D3) 2000 units capsule Take 2,000 Units by mouth every morning.  . furosemide (LASIX) 40 MG tablet Take 1 tablet (40 mg total) by mouth daily.  . isosorbide mononitrate (IMDUR) 30 MG 24 hr tablet TAKE 1 TAB BY MOUTH DAILY.  Marland Kitchen levothyroxine (SYNTHROID, LEVOTHROID) 50 MCG tablet Take 50 mcg by mouth daily before breakfast.  . metoprolol succinate (TOPROL-XL) 50 MG 24 hr tablet TAKE 1 TABLET (50 MG TOTAL) BY MOUTH DAILY. TAKE WITH OR IMMEDIATELY FOLLOWING A MEAL.  . nitroGLYCERIN (NITROSTAT) 0.4 MG SL tablet PLACE 1 TABLET (0.4 MG TOTAL) UNDER THE TONGUE EVERY 5 (FIVE) MINUTES AS NEEDED FOR CHEST PAIN.  Marland Kitchen potassium chloride 20 MEQ TBCR Take 20 mEq by mouth daily.  . sotalol (BETAPACE) 120 MG tablet TAKE 1 TABLET (120 MG TOTAL) BY MOUTH 2 (TWO) TIMES DAILY.  Marland Kitchen warfarin (COUMADIN) 5 MG tablet TAKE AS DIRECTED BY COUMADIN CLINIC  . [DISCONTINUED] furosemide (LASIX) 20 MG tablet TAKE 1 TABLET BY MOUTH EVERY DAY  . [DISCONTINUED] potassium chloride (K-DUR) 10 MEQ tablet Take 1 tablet (10 mEq total) by mouth daily.     Allergies:   Ceftriaxone, Penicillins, Levofloxacin, Levofloxacin, Oxycodone, Tape, Alendronate, Aspirin, Oxycodone-acetaminophen, and Penicillin g   Social History   Tobacco Use  . Smoking status: Never Smoker  . Smokeless tobacco: Never Used  Substance Use Topics  . Alcohol use: No  . Drug use: No     Family Hx: The patient's family history includes Cancer in her brother and sister; Heart attack in her mother.  ROS   EKGs/Labs/Other Test Reviewed:    EKG:  EKG is   ordered today.  The ekg ordered today demonstrates normal sinus rhythm, heart rate 63, normal axis, septal Q waves, PACs, QTC 440  Recent Labs: 08/29/2018: BUN 15; Creatinine, Ser 0.92; Hemoglobin 14.2; Platelets 593; Potassium 4.2; Sodium 142   Recent Lipid Panel Lab Results  Component Value Date/Time   CHOL  178 09/23/2016 09:12 AM   TRIG 90 09/23/2016 09:12 AM   HDL 37 (L) 09/23/2016 09:12 AM   CHOLHDL 4.8 09/23/2016 09:12 AM   LDLCALC 123 (H) 09/23/2016 09:12 AM    Physical Exam:     VS:  BP 128/80   Pulse 63   Ht 5\' 5"  (1.651 m)   Wt 287 lb 12.8 oz (130.5 kg)   SpO2 94%   BMI 47.89 kg/m     Wt Readings from Last 3 Encounters:  08/14/19 287 lb 12.8 oz (130.5 kg)  12/11/18 241 lb (109.3 kg)  08/29/18 283 lb 12.8 oz (128.7 kg)     Constitutional:      Appearance: Healthy appearance. Not in distress.  Pulmonary:     Effort: Pulmonary effort is  normal.     Breath sounds: No wheezing. Bibasilar Rales present.  Cardiovascular:     Normal rate. Regular rhythm. Normal S1. Normal S2.     Murmurs: There is no murmur.  Edema:    Ankle: bilateral 1+ edema of the ankle.    Feet: bilateral 1+ edema of the feet. Abdominal:     Palpations: Abdomen is soft.  Musculoskeletal:     Cervical back: Neck supple. Skin:    General: Skin is warm and dry.  Neurological:     Mental Status: Alert and oriented to person, place and time.     Cranial Nerves: Cranial nerves are intact.       ASSESSMENT & PLAN:    1. Acute on chronic diastolic CHF (congestive heart failure) ([place]) Echocardiogram July 2020 with EF 55-60 and grade 2 diastolic dysfunction.  She likely has rales on exam.  However, her exam is somewhat difficult.  She does have bilateral pedal edema.  Her weights are not on our scales.  However her weight in July 2020 was 283 and is 287 today.  I have recommended that she increase her furosemide to 40 mg daily.  Increase potassium to 20 mEq daily.  I will obtain a BMET, CBC, TSH and BNP today.  Repeat a BMP in 1 week.  Follow-up in 2 weeks.  If her BNP is significantly elevated, consider repeating an echocardiogram and increasing her furosemide further.  2. Leg swelling This is all likely related to volume excess.  She does not think her TSH has been checked in quite some time.  I will  obtain a TSH today.  If her BNP is normal, consider recommending compression stockings.  3. Persistent atrial fibrillation (HCC) Maintaining sinus rhythm on sotalol.  Continue warfarin.  Obtain follow-up CBC.  4. Coronary artery disease involving native coronary artery of native heart without angina pectoris History of drug-eluting stent to the LAD in March 2017.  Myoview in July 2020 was low risk.  She is not having anginal symptoms.  She is not on aspirin as she is on Warfarin.  She is intol of statins.   5. Essential hypertension The patient's blood pressure is controlled on her current regimen.  Continue current therapy.   6. Hypothyroidism, unspecified type Obtain TSH as noted above.    Dispo:  Return in about 2 weeks (around 08/28/2019) for Close Follow Up w/ Dr. Marlou Porch, APP on his team, or Richardson Dopp, PA-C, in person.   Medication Adjustments/Labs and Tests Ordered: Current medicines are reviewed at length with the patient today.  Concerns regarding medicines are outlined above.  Tests Ordered: Orders Placed This Encounter  Procedures  . Basic metabolic panel  . CBC  . TSH  . Pro b natriuretic peptide (BNP)  . Basic metabolic panel  . EKG 12-Lead   Medication Changes: Meds ordered this encounter  Medications  . furosemide (LASIX) 40 MG tablet    Sig: Take 1 tablet (40 mg total) by mouth daily.    Dispense:  90 tablet    Refill:  1  . potassium chloride 20 MEQ TBCR    Sig: Take 20 mEq by mouth daily.    Dispense:  90 tablet    Refill:  1    Signed, Richardson Dopp, PA-C  08/14/2019 3:42 PM    [facility] [address], [place], [place]  [address] Phone: [phone]; Fax: [phone]

## 2019-08-14 ENCOUNTER — Other Ambulatory Visit: Payer: Self-pay

## 2019-08-14 ENCOUNTER — Ambulatory Visit (INDEPENDENT_AMBULATORY_CARE_PROVIDER_SITE_OTHER): Payer: Medicare Other | Admitting: Physician Assistant

## 2019-08-14 ENCOUNTER — Encounter: Payer: Self-pay | Admitting: Physician Assistant

## 2019-08-14 VITALS — BP 128/80 | HR 63 | Ht 65.0 in | Wt 287.8 lb

## 2019-08-14 DIAGNOSIS — I4819 Other persistent atrial fibrillation: Secondary | ICD-10-CM

## 2019-08-14 DIAGNOSIS — M7989 Other specified soft tissue disorders: Secondary | ICD-10-CM

## 2019-08-14 DIAGNOSIS — I10 Essential (primary) hypertension: Secondary | ICD-10-CM | POA: Diagnosis not present

## 2019-08-14 DIAGNOSIS — E039 Hypothyroidism, unspecified: Secondary | ICD-10-CM

## 2019-08-14 DIAGNOSIS — I2510 Atherosclerotic heart disease of native coronary artery without angina pectoris: Secondary | ICD-10-CM | POA: Diagnosis not present

## 2019-08-14 DIAGNOSIS — I5033 Acute on chronic diastolic (congestive) heart failure: Secondary | ICD-10-CM | POA: Diagnosis not present

## 2019-08-14 MED ORDER — FUROSEMIDE 40 MG PO TABS: 40.0000 mg | ORAL_TABLET | Freq: Every day | ORAL | 1 refills | Status: DC

## 2019-08-14 MED ORDER — POTASSIUM CHLORIDE ER 20 MEQ PO TBCR: 20.0000 meq | EXTENDED_RELEASE_TABLET | Freq: Every day | ORAL | 1 refills | Status: DC

## 2019-08-14 NOTE — Patient Instructions (Addendum)
Medication Instructions:  Your physician has recommended you make the following change in your medication:   1) Increase Lasix to 40 mg, 2 tablets by mouth once a day 2) Increase Potassium to 20 mEq, 2 tablets by mouth once a day  *If you need a refill on your cardiac medications before your next appointment, please call your pharmacy*  Lab Work: You will have labs drawn today: BMET/BNP/CBC/TSH  Your physician recommends that you return for lab work in 1 week on 08/21/19; you may come anytime between 7:30AM-4:30PM for lab work, you do not need to be fasting.  Testing/Procedures: None ordered today  Follow-Up:  On 09/02/19 at 2:15PM with Richardson Dopp, PA-C

## 2019-08-15 ENCOUNTER — Other Ambulatory Visit: Payer: Self-pay | Admitting: Cardiology

## 2019-08-15 ENCOUNTER — Telehealth: Payer: Self-pay | Admitting: *Deleted

## 2019-08-15 LAB — BASIC METABOLIC PANEL
BUN/Creatinine Ratio: 12 (ref 12–28)
BUN: 10 mg/dL (ref 8–27)
CO2: 25 mmol/L (ref 20–29)
Calcium: 8.9 mg/dL (ref 8.7–10.3)
Chloride: 100 mmol/L (ref 96–106)
Creatinine, Ser: 0.85 mg/dL (ref 0.57–1.00)
GFR calc Af Amer: 79 mL/min/{1.73_m2} (ref >59)
GFR calc non Af Amer: 68 mL/min/{1.73_m2} (ref >59)
Glucose: 103 mg/dL — ABNORMAL HIGH (ref 65–99)
Potassium: 4.2 mmol/L (ref 3.5–5.2)
Sodium: 139 mmol/L (ref 134–144)

## 2019-08-15 LAB — CBC
Hematocrit: 44.4 % (ref 34.0–46.6)
Hemoglobin: 14.4 g/dL (ref 11.1–15.9)
MCH: 30.3 pg (ref 26.6–33.0)
MCHC: 32.4 g/dL (ref 31.5–35.7)
MCV: 93 fL (ref 79–97)
Platelets: 549 10*3/uL — ABNORMAL HIGH (ref 150–450)
RBC: 4.76 x10E6/uL (ref 3.77–5.28)
RDW: 13.4 % (ref 11.7–15.4)
WBC: 8.7 10*3/uL (ref 3.4–10.8)

## 2019-08-15 LAB — TSH: TSH: 2 u[IU]/mL (ref 0.450–4.500)

## 2019-08-15 LAB — PRO B NATRIURETIC PEPTIDE: NT-Pro BNP: 383 pg/mL — ABNORMAL HIGH (ref 0–301)

## 2019-08-15 MED ORDER — POTASSIUM CHLORIDE ER 20 MEQ PO TBCR: 10.0000 meq | EXTENDED_RELEASE_TABLET | Freq: Every day | ORAL | 1 refills | Status: DC

## 2019-08-15 MED ORDER — FUROSEMIDE 40 MG PO TABS: 20.0000 mg | ORAL_TABLET | Freq: Every day | ORAL | 1 refills | Status: DC

## 2019-08-15 NOTE — Telephone Encounter (Signed)
-----   Message from Liliane Shi, PA-C sent at 08/15/2019 10:51 AM EDT ----- Hgb, TSH, Creatinine, K+ normal.  BNP just minimally elevated. PLAN:   - Continue Furosemide 40 mg once daily and K+ 20 mEq once daily x 7 days  - Then resume prior dose (Furosemide 20 mg once daily, K+ 10 mEq once daily)  - BMET 1 week Richardson Dopp, PA-C    08/15/2019 10:49 AM

## 2019-08-15 NOTE — Telephone Encounter (Signed)
Patient notified.  She does not need new prescription sent in.  Will update med list.  Patient already has lab work scheduled for July 21 and appointment with Richardson Dopp, [place] on August 2,2021. She is aware to keep these appointments.

## 2019-08-21 ENCOUNTER — Other Ambulatory Visit: Payer: Medicare Other

## 2019-08-23 ENCOUNTER — Other Ambulatory Visit: Payer: Self-pay

## 2019-08-23 ENCOUNTER — Other Ambulatory Visit: Payer: Medicare Other | Admitting: *Deleted

## 2019-08-23 DIAGNOSIS — I5033 Acute on chronic diastolic (congestive) heart failure: Secondary | ICD-10-CM

## 2019-08-23 DIAGNOSIS — M7989 Other specified soft tissue disorders: Secondary | ICD-10-CM

## 2019-08-23 DIAGNOSIS — E039 Hypothyroidism, unspecified: Secondary | ICD-10-CM

## 2019-08-23 LAB — BASIC METABOLIC PANEL
BUN/Creatinine Ratio: 12 (ref 12–28)
BUN: 11 mg/dL (ref 8–27)
CO2: 27 mmol/L (ref 20–29)
Calcium: 8.9 mg/dL (ref 8.7–10.3)
Chloride: 98 mmol/L (ref 96–106)
Creatinine, Ser: 0.91 mg/dL (ref 0.57–1.00)
GFR calc Af Amer: 72 mL/min/{1.73_m2} (ref >59)
GFR calc non Af Amer: 63 mL/min/{1.73_m2} (ref >59)
Glucose: 98 mg/dL (ref 65–99)
Potassium: 4.2 mmol/L (ref 3.5–5.2)
Sodium: 138 mmol/L (ref 134–144)

## 2019-09-02 ENCOUNTER — Encounter: Payer: Self-pay | Admitting: Physician Assistant

## 2019-09-02 ENCOUNTER — Ambulatory Visit (INDEPENDENT_AMBULATORY_CARE_PROVIDER_SITE_OTHER): Payer: Medicare Other | Admitting: Physician Assistant

## 2019-09-02 ENCOUNTER — Other Ambulatory Visit: Payer: Self-pay

## 2019-09-02 VITALS — BP 124/60 | HR 54 | Ht 65.0 in | Wt 287.0 lb

## 2019-09-02 DIAGNOSIS — I2510 Atherosclerotic heart disease of native coronary artery without angina pectoris: Secondary | ICD-10-CM | POA: Diagnosis not present

## 2019-09-02 DIAGNOSIS — I5032 Chronic diastolic (congestive) heart failure: Secondary | ICD-10-CM | POA: Diagnosis not present

## 2019-09-02 DIAGNOSIS — I4819 Other persistent atrial fibrillation: Secondary | ICD-10-CM

## 2019-09-02 DIAGNOSIS — R42 Dizziness and giddiness: Secondary | ICD-10-CM

## 2019-09-02 DIAGNOSIS — I10 Essential (primary) hypertension: Secondary | ICD-10-CM

## 2019-09-02 MED ORDER — METOPROLOL SUCCINATE ER 25 MG PO TB24: 25.0000 mg | ORAL_TABLET | Freq: Every day | ORAL | 2 refills | Status: DC

## 2019-09-02 NOTE — Patient Instructions (Addendum)
Medication Instructions:  Your physician has recommended you make the following change in your medication:   1) Decrease Metoprolol to 25 mg  *If you need a refill on your cardiac medications before your next appointment, please call your pharmacy*  Lab Work: None ordered today  Testing/Procedures: None ordered today  Follow-Up:  On 12/03/19 at 1:20PM with [name], MD

## 2019-09-02 NOTE — Progress Notes (Signed)
Cardiology Office Note:    Date:  09/02/2019   ID:  PIETRA ZULUAGA, DOB [birth year], MRN [id number]  PCP:  Elisabeth Cara, PA-C  Cardiologist:  [name], MD   Electrophysiologist:  Constance Haw, MD   Referring MD: Elisabeth Cara, *   Chief Complaint:   Follow-up (CHF) and Dizziness    Patient Profile:    [name] is a 74 y.o. female with:   Coronary artery disease ? S/p DES to LAD in 04/2015  Persistent AFib ? S/p DCCV in 2007 ? Recurrent AFib in 2016   ? Tikosyn not covered by insurance >> Sotalol started in 2017 >> DCCV >> NSR ? CHADS2-VASc=5 (age, Diabetes, HTN, CHF, CAD) >> Coumadin ? Bradycardia: Metoprolol reduced in 07/2018  Dilated CM ? EF 25-30 in 2016 >> improved to normal in sinus rhythm ? Echo 8/18: EF 55-60, Gr 2 diast dysfn  Chronic Diastolic CHF   Diabetes mellitus  Hypertension  Hyperlipidemia  Hx of TTP in 2004  Obesity  Prior CV studies: Echocardiogram 08/23/2018 EF 55-60, Gr 2 DD, normal RVSF, mild LAE, mild AI  Myoview 08/23/2018 EF 54, normal perfusion, low risk   Echocardiogram 09/24/2016 Mild concentric LVH, EF 55-60, normal wall motion, grade 2 diastolic dysfunction, mildly calcified aortic valve leaflets, trivial AI, trivial MR, mild LAE (AP dimension 45), trivial PI, trivial pericardial effusion  Transesophageal echocardiogram 09/30/2015 EF 50-55, normal wall motion, mild AI, mild MR, no evidence of thrombus in LA/RA or LAA  Echocardiogram 08/21/2015 Mild concentric LVH, EF 50-55, normal wall motion, trivial AI, mild to moderate LAE, mild RAE  LHC 04/17/15 LAD mid 80%, D1 50% RCA proximal 30% EF 25-35% PCI: STENT SYNERGY DES 3.5X16 to mLAD  Echo 03/31/15 Mild LVH, EF 25-30%, diffuse HK, mild LAE  Echo 01/06/15 Mild concentric LVH, EF 25-30%, anteroseptal akinesis, inferoseptal akinesis, anterior akinesis, trivial AI, mild MR, moderate LAE, trivial TR, trivial PI, PASP 33 mmHg  History of  Present Illness:    [name] was last seen 08/14/2019 with symptoms of leg swelling and shortness of breath.  She appeared to be volume overloaded and I adjusted her furosemide for 1 week.  Her BNP was not significantly elevated.  She returns for follow up.  She had some improvement in her breathing and swelling after the higher dose furosemide.  She had been in her USOH until this AM.  She has felt near syncopal all day.  She notes blurry vision.  She became tearful at one point in the room b/c of how poorly she feels.  She has not had syncope, chest pain, orthopnea.   Past Medical History:  Diagnosis Date  . CAD (coronary artery disease)    a. LHC 3/17: mLAD 80, D1 50, pRCA 30, EF 25-35%; PCI: 3.5 x 16 mm Synergy DES to mLAD // Myoview 08/2018: EF 54, normal perfusion; Low Risk  . Chronic diastolic CHF 1/76/1607   Echocardiogram 08/2018: EF 55-60, mild septal basal hypertrophy, Gr 2 DD, elevated LVEDP, mild LAE, mild AI  . Dilated cardiomyopathy >> EF returned to normal in NSR    Probable Tachycardia Mediated // a. Echo 12/16: Mild concentric LVH, EF 25-30%, anteroseptal akinesis, inferoseptal akinesis, anterior akinesis, trivial AI, mild MR, moderate LAE, trivial TR, trivial PI, PASP 33 mmHg  //  b. Echo 2/17: Mild LVH, EF 25-30%, diffuse HK, mild LAE  //  c. Echo 7/17: mild LVH, EF 50-55%, no RWMA, trivial AI, mild to mod LAE, mild  RAE  . Febrile seizure ([place]) 2004   "during TTP thing; cause my temp was 106"  . High cholesterol   . History of blood transfusion 2004   "several; related to TTP"  . Hypertension   . Hypothyroidism   . Morbid obesity ([place]) 01/07/2013  . Osteoarthritis of back   . Paroxysmal atrial fibrillation ([place]) 08/01/2011   a. started on Sotalol during 10/2015 admission (Tikosyn too expensive)  . Pneumonia ?1999  . Scoliosis   . Thyroid nodule    "grew back after 2 partial thyroid surgeries" (09/29/2015)  . 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 Cholecalciferol (VITAMIN D3) 2000 units capsule Take 2,000 Units by mouth every morning.  . furosemide (LASIX) 40 MG tablet Take 0.5 tablets (20 mg total) by mouth daily.  . isosorbide mononitrate (IMDUR) 30 MG 24 hr tablet TAKE 1 TAB BY MOUTH DAILY.  Marland Kitchen levothyroxine (SYNTHROID, LEVOTHROID) 50 MCG tablet Take 50 mcg by mouth daily before breakfast.  . metoprolol succinate (TOPROL-XL) 25 MG 24 hr tablet Take 1 tablet (25 mg total) by mouth daily. Take with or immediately following a meal.  . nitroGLYCERIN (NITROSTAT) 0.4 MG SL tablet PLACE 1 TABLET (0.4 MG TOTAL) UNDER THE TONGUE EVERY 5 (FIVE) MINUTES AS NEEDED FOR CHEST PAIN.  Marland Kitchen Potassium Chloride ER 20 MEQ TBCR Take 10 mEq by mouth daily.  . sotalol (BETAPACE) 120 MG tablet TAKE 1 TABLET (120 MG TOTAL) BY MOUTH 2 (TWO) TIMES DAILY.  Marland Kitchen warfarin (COUMADIN) 5 MG tablet TAKE AS DIRECTED BY COUMADIN CLINIC  . [DISCONTINUED] metoprolol succinate (TOPROL-XL) 50 MG 24 hr tablet TAKE 1 TABLET (50 MG TOTAL) BY MOUTH DAILY. TAKE WITH OR IMMEDIATELY FOLLOWING A MEAL.     Allergies:   Ceftriaxone, Penicillins, Levofloxacin, Levofloxacin, Oxycodone, Tape, Alendronate, Aspirin, Oxycodone-acetaminophen, and Penicillin g   Social History   Tobacco Use  . Smoking status: Never Smoker  . Smokeless tobacco: Never Used  Substance Use Topics  . Alcohol use: No  . Drug use: No     Family Hx: The patient's family history includes Cancer in her brother and sister; Heart attack in her mother.  Review of Systems  Constitutional: Negative for fever.  Eyes: Positive for blurred vision.  Respiratory: Negative for cough.   Hematologic/Lymphatic: Negative for bleeding problem.  Gastrointestinal: Negative for diarrhea, hematochezia, melena and vomiting.  Genitourinary: Negative for hematuria.  Neurological: Negative for focal weakness.      EKGs/Labs/Other Test Reviewed:    EKG:  EKG is   ordered  today.  The ekg ordered today demonstrates sinus brady, HR 53, normal axis, QTc 456 ms  Recent Labs: 08/14/2019: Hemoglobin 14.4; NT-Pro BNP 383; Platelets 549; TSH 2.000 08/23/2019: BUN 11; Creatinine, Ser 0.91; Potassium 4.2; Sodium 138   Recent Lipid Panel Lab Results  Component Value Date/Time   CHOL 178 09/23/2016 09:12 AM   TRIG 90 09/23/2016 09:12 AM   HDL 37 (L) 09/23/2016 09:12 AM   CHOLHDL 4.8 09/23/2016 09:12 AM   LDLCALC 123 (H) 09/23/2016 09:12 AM    Physical Exam:     VS:  BP 124/60   Pulse (!) 54   Ht 5\' 5"  (1.651 m)   Wt 287 lb (130.2 kg)   SpO2 94%   BMI 47.76 kg/m     Wt Readings from Last 3 Encounters:  09/02/19 287 lb (130.2 kg)  08/14/19 287 lb 12.8 oz (130.5 kg)  12/11/18 241  lb (109.3 kg)     Constitutional:      Appearance: Healthy appearance. Not in distress.  Neck:     Lymphadenopathy: No cervical adenopathy.  Pulmonary:     Effort: Pulmonary effort is normal.     Breath sounds: No wheezing. No rales.  Cardiovascular:     Normal rate. Regular rhythm. Normal S1. Normal S2.     Murmurs: There is no murmur.  Edema:    Pretibial: bilateral trace edema of the pretibial area. Abdominal:     Palpations: Abdomen is soft.     Tenderness: There is no abdominal tenderness.  Musculoskeletal:        General: No deformity.     Cervical back: Neck supple. Skin:    General: Skin is warm and dry.  Neurological:     Mental Status: Alert and oriented to person, place and time.     Cranial Nerves: Cranial nerves are intact.  Psychiatric:        Mood and Affect: Affect is tearful.        ASSESSMENT & PLAN:    1. Chronic diastolic CHF (congestive heart failure) (HCC) Echocardiogram in 08/2018 with normal EF and Gr 2 DD.  Overall, her volume appears stable.  She can take extra furosemide 20 mg as needed for increased swelling or significant weight gain.    2. Persistent atrial fibrillation (HCC) Maintaining normal sinus rhythm.  Continue Warfarin for  anticoagulation and Sotalol for rhythm control.    3. Coronary artery disease involving native coronary artery of native heart without angina pectoris S/p DES to LAD in 2017.  Myoview in 08/2018 was low risk.  She is not having anginal symptoms.  She is intol of statins.  She is not on ASA as she is on Warfarin.    4. Essential hypertension The patient's blood pressure is controlled on her current regimen.  Continue current therapy.    5. Dizziness Etiology of her dizziness is not clear.  EKG today demonstrates sinus bradycardia.  Question if this could be making her feel this way.  She has no focal deficits on exam.  She actually started feeling better after a few minutes.  She does not want to have lab work drawn.  I will reduce her beta-blocker dose.  If she feels worse later, she knows to go to the ED.  -Reduce Metoprolol succinate to 25 mg once daily  -Go to ED if symptoms worsen.        Dispo:  Return in about 3 months (around 12/03/2019) for Routine Follow Up w/ Dr. Marlou Porch, or Richardson Dopp, PA-C, in person.   Medication Adjustments/Labs and Tests Ordered: Current medicines are reviewed at length with the patient today.  Concerns regarding medicines are outlined above.  Tests Ordered: Orders Placed This Encounter  Procedures  . EKG 12-Lead   Medication Changes: Meds ordered this encounter  Medications  . metoprolol succinate (TOPROL-XL) 25 MG 24 hr tablet    Sig: Take 1 tablet (25 mg total) by mouth daily. Take with or immediately following a meal.    Dispense:  90 tablet    Refill:  2    Signed, Richardson Dopp, PA-C  09/02/2019 3:41 PM    [facility] [address], [place], [place]  [address] Phone: [phone]; Fax: [phone]

## 2019-09-27 ENCOUNTER — Other Ambulatory Visit: Payer: Self-pay

## 2019-09-27 ENCOUNTER — Ambulatory Visit (INDEPENDENT_AMBULATORY_CARE_PROVIDER_SITE_OTHER): Payer: Medicare Other | Admitting: *Deleted

## 2019-09-27 DIAGNOSIS — I4819 Other persistent atrial fibrillation: Secondary | ICD-10-CM | POA: Diagnosis not present

## 2019-09-27 DIAGNOSIS — Z5181 Encounter for therapeutic drug level monitoring: Secondary | ICD-10-CM | POA: Diagnosis not present

## 2019-09-27 LAB — POCT INR: INR: 2.1 (ref 2.0–3.0)

## 2019-09-27 NOTE — Patient Instructions (Addendum)
Description    Continue taking Coumadin 5mg  (1 tablet) daily except 7.5 mg (1.5 tablets) on Tuesdays and Fridays. Recheck INR 7 weeks. Call us with any new medications or concerns [phone].

## 2019-10-11 ENCOUNTER — Other Ambulatory Visit: Payer: Self-pay | Admitting: Cardiology

## 2019-10-16 ENCOUNTER — Other Ambulatory Visit: Payer: Self-pay | Admitting: Cardiology

## 2019-10-23 ENCOUNTER — Other Ambulatory Visit: Payer: Self-pay

## 2019-10-23 MED ORDER — FUROSEMIDE 40 MG PO TABS: 20.0000 mg | ORAL_TABLET | Freq: Every day | ORAL | 3 refills | Status: DC

## 2019-10-23 NOTE — Telephone Encounter (Signed)
Pt's medication was sent to pt's pharmacy as requested. Confirmation received.  °

## 2019-11-15 ENCOUNTER — Other Ambulatory Visit: Payer: Self-pay

## 2019-11-15 ENCOUNTER — Ambulatory Visit (INDEPENDENT_AMBULATORY_CARE_PROVIDER_SITE_OTHER): Payer: Medicare Other | Admitting: *Deleted

## 2019-11-15 DIAGNOSIS — Z5181 Encounter for therapeutic drug level monitoring: Secondary | ICD-10-CM

## 2019-11-15 DIAGNOSIS — I4819 Other persistent atrial fibrillation: Secondary | ICD-10-CM | POA: Diagnosis not present

## 2019-11-15 LAB — POCT INR: INR: 2.3 (ref 2.0–3.0)

## 2019-11-15 NOTE — Patient Instructions (Signed)
Description   Continue taking Coumadin 5mg  (1 tablet) daily except 7.5 mg (1.5 tablets) on Tuesdays and Fridays. Recheck INR 8 weeks. Call us with any new medications or concerns [phone].

## 2019-11-28 ENCOUNTER — Other Ambulatory Visit: Payer: Self-pay | Admitting: Cardiology

## 2019-12-03 ENCOUNTER — Other Ambulatory Visit: Payer: Self-pay

## 2019-12-03 ENCOUNTER — Encounter: Payer: Self-pay | Admitting: Cardiology

## 2019-12-03 ENCOUNTER — Ambulatory Visit (INDEPENDENT_AMBULATORY_CARE_PROVIDER_SITE_OTHER): Payer: Medicare Other | Admitting: Cardiology

## 2019-12-03 VITALS — BP 130/88 | HR 74 | Ht 65.0 in | Wt 282.0 lb

## 2019-12-03 DIAGNOSIS — I4819 Other persistent atrial fibrillation: Secondary | ICD-10-CM

## 2019-12-03 DIAGNOSIS — I10 Essential (primary) hypertension: Secondary | ICD-10-CM

## 2019-12-03 DIAGNOSIS — I2510 Atherosclerotic heart disease of native coronary artery without angina pectoris: Secondary | ICD-10-CM | POA: Diagnosis not present

## 2019-12-03 DIAGNOSIS — I5032 Chronic diastolic (congestive) heart failure: Secondary | ICD-10-CM | POA: Diagnosis not present

## 2019-12-03 NOTE — Progress Notes (Signed)
Cardiology Office Note:    Date:  12/03/2019   ID:  [name], DOB 03/06/1945, MRN [id number]  PCP:  Elisabeth Cara, PA-C  [facility] Cardiologist:  Ariel Furbish, MD  [facility] Electrophysiologist:  Will Meredith Leeds, MD   Referring MD: Belva Bertin, [place], *    History of Present Illness:    [name] is a 74 y.o. female here for follow-up of coronary artery disease with chronic diastolic heart failure, paroxysmal atrial fibrillation.  She was seen by Richardson Dopp on 09/02/2019 because of leg swelling and shortness of breath.  Her BNP was not significantly elevated.  Her furosemide was increased and she felt better.  She then right before the visit with Nicki Reaper had near syncope all day with blurry vision.  She was tearful.  Her volume status appears stable and it was determined that she could take an extra furosemide 20 mg as needed for increased swelling or significant weight gain.  Her metoprolol was reduced to 25 mg once a day because of bradycardia.  Although, the etiology of dizziness was unclear.  Had drug-eluting stent to the LAD in 2017.  With her persistent atrial fibrillation a cardioversion in 2007 but recurrence in 2016.  Sotalol begun in 2017 because of inability to obtain Tikosyn because of her insurance.  Cardioversion again in 2017, sinus rhythm.  She is on Coumadin given her CHA2DS2-VASc of 5 secondary to age diabetes hypertension heart failure and CAD.  She did have some bradycardia in which her metoprolol was reduced and 2020.  Back in 2016 her EF was 25%, likely exacerbated by tachycardia.  She is improved with sinus rhythm.  Latest echocardiogram on 08/23/2018 showed EF of 60% with grade 2 diastolic dysfunction mild AI  Past Medical History:  Diagnosis Date   CAD (coronary artery disease)    a. LHC 3/17: mLAD 80, D1 50, pRCA 30, EF 25-35%; PCI: 3.5 x 16 mm Synergy DES to mLAD // Myoview 08/2018: EF 54, normal perfusion; Low Risk    Chronic diastolic CHF 07/17/735   Echocardiogram 08/2018: EF 55-60, mild septal basal hypertrophy, Gr 2 DD, elevated LVEDP, mild LAE, mild AI   Dilated cardiomyopathy >> EF returned to normal in NSR    Probable Tachycardia Mediated // a. Echo 12/16: Mild concentric LVH, EF 25-30%, anteroseptal akinesis, inferoseptal akinesis, anterior akinesis, trivial AI, mild MR, moderate LAE, trivial TR, trivial PI, PASP 33 mmHg  //  b. Echo 2/17: Mild LVH, EF 25-30%, diffuse HK, mild LAE  //  c. Echo 7/17: mild LVH, EF 50-55%, no RWMA, trivial AI, mild to mod LAE, mild RAE   Febrile seizure ([place]) 2004   "during TTP thing; cause my temp was 106"   High cholesterol    History of blood transfusion 2004   "several; related to TTP"   Hypertension    Hypothyroidism    Morbid obesity ([place]) 01/07/2013   Osteoarthritis of back    Paroxysmal atrial fibrillation ([place]) 08/01/2011   a. 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    Past Surgical History:  Procedure Laterality Date   ABDOMINAL EXPLORATION SURGERY     "opened me up 2-3 times for blocked intestines"   APPENDECTOMY     BACK SURGERY     BASAL CELL CARCINOMA EXCISION Right    "  neck"   CARDIAC CATHETERIZATION N/A 04/17/2015   Procedure: Left Heart Cath and Coronary Angiography;  Surgeon: Jettie Booze, MD;  Location: [place] [facility];  Service: Cardiovascular;  Laterality: N/A;   CARDIAC CATHETERIZATION  04/17/2015   Procedure: Coronary Stent Intervention;  Surgeon: Jettie Booze, MD;  Location: [place] [facility];  Service: Cardiovascular;;   CARDIOVERSION  06/2002; 03/2005; 05/2005   Archie Endo 06/16/2010   CARDIOVERSION N/A 10/03/2015   Procedure: CARDIOVERSION;  Surgeon: Deboraha Sprang, MD;  Location: [facility];  Service: Cardiovascular;   Laterality: N/A;   CATARACT EXTRACTION W/ INTRAOCULAR LENS  IMPLANT, BILATERAL Bilateral    CHOLECYSTECTOMY OPEN     CORONARY ANGIOPLASTY     DILATION AND CURETTAGE OF UTERUS     S/P "miscarriage"   FOOT SURGERY Right    "for nerve damage"   SALIVARY STONE REMOVAL Left    "had tumor wrapped around it"   SPLENECTOMY, TOTAL     Archie Endo 06/16/2010   TEE WITHOUT CARDIOVERSION N/A 09/30/2015   Procedure: TRANSESOPHAGEAL ECHOCARDIOGRAM (TEE);  Surgeon: Skeet Latch, MD;  Location: [place];  Service: Cardiovascular;  Laterality: N/A;   THORACIC DISCECTOMY  05/2002   THYROIDECTOMY, PARTIAL  X 2   "grew back"   TONSILLECTOMY     TUBAL LIGATION     TUMOR EXCISION     "benign tumor in my neck"   VAGINAL HYSTERECTOMY      Current Medications: Current Meds  Medication Sig   Cholecalciferol (VITAMIN D3) 2000 units capsule Take 2,000 Units by mouth every morning.   furosemide (LASIX) 40 MG tablet Take 0.5 tablets (20 mg total) by mouth daily.   isosorbide mononitrate (IMDUR) 30 MG 24 hr tablet TAKE 1 TAB BY MOUTH DAILY.   levothyroxine (SYNTHROID, LEVOTHROID) 50 MCG tablet Take 50 mcg by mouth daily before breakfast.   metoprolol succinate (TOPROL-XL) 25 MG 24 hr tablet Take 1 tablet (25 mg total) by mouth daily. Take with or immediately following a meal.   nitroGLYCERIN (NITROSTAT) 0.4 MG SL tablet PLACE 1 TABLET (0.4 MG TOTAL) UNDER THE TONGUE EVERY 5 (FIVE) MINUTES AS NEEDED FOR CHEST PAIN.   potassium chloride (KLOR-CON) 10 MEQ tablet Take 10 mEq by mouth daily.   sotalol (BETAPACE) 120 MG tablet TAKE 1 TABLET (120 MG TOTAL) BY MOUTH 2 (TWO) TIMES DAILY.   warfarin (COUMADIN) 5 MG tablet TAKE AS DIRECTED BY COUMADIN CLINIC     Allergies:   Ceftriaxone, Penicillins, Levofloxacin, Levofloxacin, Oxycodone, Tape, Alendronate, Aspirin, Oxycodone-acetaminophen, and Penicillin g   Social History   Socioeconomic History   Marital status: Married    Spouse name:  Not on file   Number of children: Not on file   Years of education: Not on file   Highest education level: Not on file  Occupational History   Occupation: retired  Tobacco Use   Smoking status: Never Smoker   Smokeless tobacco: Never Used  Substance and Sexual Activity   Alcohol use: No   Drug use: No   Sexual activity: Never  Other Topics Concern   Not on file  Social History Narrative   Not on file   Social Determinants of Health   Financial Resource Strain:    Difficulty of Paying Living Expenses: Not on file  Food Insecurity:    Worried About Charity fundraiser in the Last Year: Not on file   [employer] of Food in the Last Year: Not on file  Transportation Needs:  Lack of Transportation (Medical): Not on file   Lack of Transportation (Non-Medical): Not on file  Physical Activity:    Days of Exercise per Week: Not on file   Minutes of Exercise per Session: Not on file  Stress:    Feeling of Stress : Not on file  Social Connections:    Frequency of Communication with Friends and Family: Not on file   Frequency of Social Gatherings with Friends and Family: Not on file   Attends Religious Services: Not on file   Active Member of Clubs or Organizations: Not on file   Attends Archivist Meetings: Not on file   Marital Status: Not on file     Family History: The patient's family history includes Cancer in her brother and sister; Heart attack in her mother.  ROS:   Please see the history of present illness.     All other systems reviewed and are negative.    Recent Labs: 08/14/2019: Hemoglobin 14.4; NT-Pro BNP 383; Platelets 549; TSH 2.000 08/23/2019: BUN 11; Creatinine, Ser 0.91; Potassium 4.2; Sodium 138  Recent Lipid Panel    Component Value Date/Time   CHOL 178 09/23/2016 0912   TRIG 90 09/23/2016 0912   HDL 37 (L) 09/23/2016 0912   CHOLHDL 4.8 09/23/2016 0912   VLDL 18 09/23/2016 0912   LDLCALC 123 (H) 09/23/2016 0912     Physical Exam:    VS:  BP 130/88    Pulse 74    Ht 5\' 5"  (1.651 m)    Wt 282 lb (127.9 kg)    SpO2 96%    BMI 46.93 kg/m     Wt Readings from Last 3 Encounters:  12/03/19 282 lb (127.9 kg)  09/02/19 287 lb (130.2 kg)  08/14/19 287 lb 12.8 oz (130.5 kg)     GEN: overweight in wheelchair Well nourished, well developed in no acute distress HEENT: Normal NECK: No JVD; No carotid bruits LYMPHATICS: No lymphadenopathy CARDIAC: RRR, no murmurs, rubs, gallops RESPIRATORY:  Clear to auscultation without rales, wheezing or rhonchi  ABDOMEN: Soft, non-tender, non-distended MUSCULOSKELETAL:  No edema; No deformity  SKIN: Warm and dry NEUROLOGIC:  Alert and oriented x 3 PSYCHIATRIC:  Normal affect   ASSESSMENT:    1. Persistent atrial fibrillation ([place])   2. Chronic diastolic CHF (congestive heart failure) ([place])   3. Coronary artery disease involving native coronary artery of native heart without angina pectoris   4. Essential hypertension    PLAN:    In order of problems listed above:  Chronic diastolic heart failure -Agree with Richardson Dopp, PA-may take an extra furosemide 20 mg as needed.  We do not want to over diurese as she was feeling some near syncope at times.  Most recent echocardiogram showed normal EF.  Persistent atrial fibrillation -Continues to maintain sinus rhythm, on sotalol.  Watching QTC.  No changes made to medical management  Bradycardia -Metoprolol was decreased to 25 mg once a day at last clinic visit in August 2021.  Chronic anticoagulation -On Coumadin.  Managing closely.  Coronary artery disease -DES to LAD in 2017. -Nuclear stress test in July 2020 was low risk.  No anginal symptoms currently. -Unfortunately unable to tolerate statin therapy.  No aspirin because she is on warfarin to reduce bleeding risk.  Essential hypertension -Current therapy excellent.  No changes made.  No changes made today.  Medication Adjustments/Labs and Tests  Ordered: Current medicines are reviewed at length with the patient today.  Concerns regarding medicines  are outlined above.  No orders of the defined types were placed in this encounter.  No orders of the defined types were placed in this encounter.   Patient Instructions  Medication Instructions:  The current medical regimen is effective;  continue present plan and medications.  *If you need a refill on your cardiac medications before your next appointment, please call your pharmacy*  Follow-Up: At [facility], you and your health needs are our priority.  As part of our continuing mission to provide you with exceptional heart care, we have created designated Provider Care Teams.  These Care Teams include your primary Cardiologist (physician) and Advanced Practice Providers (APPs -  Physician Assistants and Nurse Practitioners) who all work together to provide you with the care you need, when you need it.  We recommend signing up for the patient portal called "MyChart".  Sign up information is provided on this After Visit Summary.  MyChart is used to connect with patients for Virtual Visits (Telemedicine).  Patients are able to view lab/test results, encounter notes, upcoming appointments, etc.  Non-urgent messages can be sent to your provider as well.   To learn more about what you can do with MyChart, go to [email or web address].    Your next appointment:   6 month(s)  The format for your next appointment:   In Person  Provider:   Richardson Dopp  And Dr Marlou Porch in 1 year.   Thank you for choosing [facility]!!        Signed, Ariel Furbish, MD  12/03/2019 1:42 PM    [facility]

## 2019-12-03 NOTE — Patient Instructions (Signed)
Medication Instructions:  The current medical regimen is effective;  continue present plan and medications.  *If you need a refill on your cardiac medications before your next appointment, please call your pharmacy*  Follow-Up: At [facility], you and your health needs are our priority.  As part of our continuing mission to provide you with exceptional heart care, we have created designated Provider Care Teams.  These Care Teams include your primary Cardiologist (physician) and Advanced Practice Providers (APPs -  Physician Assistants and Nurse Practitioners) who all work together to provide you with the care you need, when you need it.  We recommend signing up for the patient portal called "MyChart".  Sign up information is provided on this After Visit Summary.  MyChart is used to connect with patients for Virtual Visits (Telemedicine).  Patients are able to view lab/test results, encounter notes, upcoming appointments, etc.  Non-urgent messages can be sent to your provider as well.   To learn more about what you can do with MyChart, go to [email or web address].    Your next appointment:   6 month(s)  The format for your next appointment:   In Person  Provider:   Richardson Dopp  And Dr Marlou Porch in 1 year.   Thank you for choosing [place] [place]!!

## 2020-01-10 ENCOUNTER — Other Ambulatory Visit: Payer: Self-pay

## 2020-01-10 ENCOUNTER — Encounter (INDEPENDENT_AMBULATORY_CARE_PROVIDER_SITE_OTHER): Payer: Self-pay

## 2020-01-10 ENCOUNTER — Ambulatory Visit (INDEPENDENT_AMBULATORY_CARE_PROVIDER_SITE_OTHER): Payer: Medicare Other | Admitting: *Deleted

## 2020-01-10 DIAGNOSIS — I4819 Other persistent atrial fibrillation: Secondary | ICD-10-CM

## 2020-01-10 DIAGNOSIS — Z5181 Encounter for therapeutic drug level monitoring: Secondary | ICD-10-CM | POA: Diagnosis not present

## 2020-01-10 LAB — POCT INR: INR: 3.3 — AB (ref 2.0–3.0)

## 2020-01-10 NOTE — Patient Instructions (Signed)
Description   Do not take any Warfarin today then continue taking Coumadin 5mg  (1 tablet) daily except 7.5 mg (1.5 tablets) on Tuesdays and Fridays. Recheck INR 5 weeks. Call us with any new medications or concerns [phone].

## 2020-01-19 ENCOUNTER — Other Ambulatory Visit: Payer: Self-pay | Admitting: Cardiology

## 2020-02-10 ENCOUNTER — Encounter: Payer: Self-pay | Admitting: Cardiology

## 2020-02-10 ENCOUNTER — Ambulatory Visit (INDEPENDENT_AMBULATORY_CARE_PROVIDER_SITE_OTHER): Payer: Medicare Other | Admitting: Cardiology

## 2020-02-10 ENCOUNTER — Ambulatory Visit (INDEPENDENT_AMBULATORY_CARE_PROVIDER_SITE_OTHER): Payer: Medicare Other | Admitting: *Deleted

## 2020-02-10 ENCOUNTER — Other Ambulatory Visit: Payer: Self-pay

## 2020-02-10 VITALS — BP 164/103 | HR 72 | Ht 65.0 in | Wt 292.0 lb

## 2020-02-10 DIAGNOSIS — I5033 Acute on chronic diastolic (congestive) heart failure: Secondary | ICD-10-CM

## 2020-02-10 DIAGNOSIS — M7989 Other specified soft tissue disorders: Secondary | ICD-10-CM | POA: Diagnosis not present

## 2020-02-10 DIAGNOSIS — I2510 Atherosclerotic heart disease of native coronary artery without angina pectoris: Secondary | ICD-10-CM | POA: Diagnosis not present

## 2020-02-10 DIAGNOSIS — Z5181 Encounter for therapeutic drug level monitoring: Secondary | ICD-10-CM

## 2020-02-10 DIAGNOSIS — I4819 Other persistent atrial fibrillation: Secondary | ICD-10-CM | POA: Diagnosis not present

## 2020-02-10 LAB — POCT INR: INR: 2.5 (ref 2.0–3.0)

## 2020-02-10 MED ORDER — FUROSEMIDE 80 MG PO TABS: 80.0000 mg | ORAL_TABLET | Freq: Every day | ORAL | 3 refills | Status: DC

## 2020-02-10 NOTE — Patient Instructions (Signed)
Medication Instructions:  Please increase your Furosemide to 80 mg twice a day for 3 days then take 80 mg once a day thereafter. Continue all other medications as listed.  *If you need a refill on your cardiac medications before your next appointment, please call your pharmacy*  Please go for Covid testing.  Follow-Up: At [facility], you and your health needs are our priority.  As part of our continuing mission to provide you with exceptional heart care, we have created designated Provider Care Teams.  These Care Teams include your primary Cardiologist (physician) and Advanced Practice Providers (APPs -  Physician Assistants and Nurse Practitioners) who all work together to provide you with the care you need, when you need it.  We recommend signing up for the patient portal called "MyChart".  Sign up information is provided on this After Visit Summary.  MyChart is used to connect with patients for Virtual Visits (Telemedicine).  Patients are able to view lab/test results, encounter notes, upcoming appointments, etc.  Non-urgent messages can be sent to your provider as well.   To learn more about what you can do with MyChart, go to [email or web address].    Your next appointment:   2 week(s)  The format for your next appointment:   In Person  Provider:   [name], MD   Thank you for choosing [facility]!!

## 2020-02-10 NOTE — Progress Notes (Signed)
Cardiology Office Note:    Date:  02/10/2020   ID:  [name], DOB [birth year], MRN [id number]  PCP:  Elisabeth Cara, PA-C  [facility] Cardiologist:  [name], MD  [place] Electrophysiologist:  Will Meredith Leeds, MD   Referring MD: Belva Bertin [place], *    History of Present Illness:    [name] is a 75 y.o. female here today secondary to shortness of breath, cough, here with her grandson who asked her to be seen today.  Has  follow-up of coronary artery disease with chronic diastolic heart failure, paroxysmal atrial fibrillation. Dry cough for a month.  Feels like she is fluid overloaded.  She took a 40 mg of Lasix yesterday.  She was seen by Richardson Dopp on 09/02/2019 because of leg swelling and shortness of breath.  Her BNP was not significantly elevated.  Her furosemide was increased and she felt better.  She then right before the visit with Nicki Reaper had near syncope all day with blurry vision.  She was tearful.  Her volume status appears stable and it was determined that she could take an extra furosemide 20 mg as needed for increased swelling or significant weight gain.  Her metoprolol was reduced to 25 mg once a day because of bradycardia.  Although, the etiology of dizziness was unclear.  Had drug-eluting stent to the LAD in 2017.  With her persistent atrial fibrillation a cardioversion in 2007 but recurrence in 2016.  Sotalol begun in 2017 because of inability to obtain Tikosyn because of her insurance.  Cardioversion again in 2017, sinus rhythm.  She is on Coumadin given her CHA2DS2-VASc of 5 secondary to age diabetes hypertension heart failure and CAD.  She did have some bradycardia in which her metoprolol was reduced and 2020.  Back in 2016 her EF was 25%, likely exacerbated by tachycardia.  She is improved with sinus rhythm.  Latest echocardiogram on 08/23/2018 showed EF of 60% with grade 2 diastolic dysfunction mild AI  Past Medical  History:  Diagnosis Date  . CAD (coronary artery disease)    a. LHC 3/17: mLAD 80, D1 50, pRCA 30, EF 25-35%; PCI: 3.5 x 16 mm Synergy DES to mLAD // Myoview 08/2018: EF 54, normal perfusion; Low Risk  . Chronic diastolic CHF [id number]   Echocardiogram 08/2018: EF 55-60, mild septal basal hypertrophy, Gr 2 DD, elevated LVEDP, mild LAE, mild AI  . Dilated cardiomyopathy >> EF returned to normal in NSR    Probable Tachycardia Mediated // a. Echo 12/16: Mild concentric LVH, EF 25-30%, anteroseptal akinesis, inferoseptal akinesis, anterior akinesis, trivial AI, mild MR, moderate LAE, trivial TR, trivial PI, PASP 33 mmHg  //  b. Echo 2/17: Mild LVH, EF 25-30%, diffuse HK, mild LAE  //  c. Echo 7/17: mild LVH, EF 50-55%, no RWMA, trivial AI, mild to mod LAE, mild RAE  . Febrile seizure ([place]) 2004   "during TTP thing; cause my temp was 106"  . High cholesterol   . History of blood transfusion 2004   "several; related to TTP"  . Hypertension   . Hypothyroidism   . Morbid obesity ([place]) 01/07/2013  . Osteoarthritis of back   . Paroxysmal atrial fibrillation ([place]) 08/01/2011   a. started on Sotalol during 10/2015 admission (Tikosyn too expensive)  . Pneumonia ?1999  . Scoliosis   . Thyroid nodule    "grew back after 2 partial thyroid surgeries" (09/29/2015)  . 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 ABDOMINAL EXPLORATION SURGERY     "opened me up 2-3 times for blocked intestines"  . APPENDECTOMY    . BACK SURGERY    . BASAL CELL CARCINOMA EXCISION Right    "neck"  . CARDIAC CATHETERIZATION N/A 04/17/2015   Procedure: Left Heart Cath and Coronary Angiography;  Surgeon: Jettie Booze, MD;  Location: [place] [facility];  Service: Cardiovascular;  Laterality: N/A;  . CARDIAC CATHETERIZATION  04/17/2015   Procedure: Coronary Stent Intervention;  Surgeon: Jettie Booze, MD;  Location: [place] [facility];  Service:  Cardiovascular;;  . CARDIOVERSION  06/2002; 03/2005; 05/2005   Archie Endo 06/16/2010  . CARDIOVERSION N/A 10/03/2015   Procedure: CARDIOVERSION;  Surgeon: Deboraha Sprang, MD;  Location: [facility];  Service: Cardiovascular;  Laterality: N/A;  . CATARACT EXTRACTION W/ INTRAOCULAR LENS  IMPLANT, BILATERAL Bilateral   . CHOLECYSTECTOMY OPEN    . CORONARY ANGIOPLASTY    . DILATION AND CURETTAGE OF UTERUS     S/P "miscarriage"  . FOOT SURGERY Right    "for nerve damage"  . SALIVARY STONE REMOVAL Left    "had tumor wrapped around it"  . SPLENECTOMY, TOTAL     Archie Endo 06/16/2010  . TEE WITHOUT CARDIOVERSION N/A 09/30/2015   Procedure: TRANSESOPHAGEAL ECHOCARDIOGRAM (TEE);  Surgeon: Skeet Latch, MD;  Location: [place];  Service: Cardiovascular;  Laterality: N/A;  . THORACIC DISCECTOMY  05/2002  . THYROIDECTOMY, PARTIAL  X 2   "grew back"  . TONSILLECTOMY    . TUBAL LIGATION    . TUMOR EXCISION     "benign tumor in my neck"  . VAGINAL HYSTERECTOMY      Current Medications: Current Meds  Medication Sig  . Cholecalciferol (VITAMIN D3) 2000 units capsule Take 2,000 Units by mouth every morning.  . furosemide (LASIX) 80 MG tablet Take 1 tablet (80 mg total) by mouth daily. Take 80 mg twice a day for 3 days then take 80 mg daily  . isosorbide mononitrate (IMDUR) 30 MG 24 hr tablet TAKE 1 TABLET BY MOUTH EVERY DAY  . levothyroxine (SYNTHROID, LEVOTHROID) 50 MCG tablet Take 50 mcg by mouth daily before breakfast.  . metoprolol succinate (TOPROL-XL) 25 MG 24 hr tablet Take 1 tablet (25 mg total) by mouth daily. Take with or immediately following a meal.  . nitroGLYCERIN (NITROSTAT) 0.4 MG SL tablet PLACE 1 TABLET (0.4 MG TOTAL) UNDER THE TONGUE EVERY 5 (FIVE) MINUTES AS NEEDED FOR CHEST PAIN.  Marland Kitchen potassium chloride (KLOR-CON) 10 MEQ tablet Take 10 mEq by mouth daily.  . sotalol (BETAPACE) 120 MG tablet TAKE 1 TABLET (120 MG TOTAL) BY MOUTH 2 (TWO) TIMES DAILY.  Marland Kitchen warfarin (COUMADIN) 5 MG tablet TAKE AS  DIRECTED BY COUMADIN CLINIC  . [DISCONTINUED] furosemide (LASIX) 40 MG tablet Take 0.5 tablets (20 mg total) by mouth daily.     Allergies:   Ceftriaxone, Penicillins, Levofloxacin, Levofloxacin, Oxycodone, Tape, Alendronate, Aspirin, Oxycodone-acetaminophen, and Penicillin g   Social History   Socioeconomic History  . Marital status: Married    Spouse name: Not on file  . Number of children: Not on file  . Years of education: Not on file  . Highest education level: Not on file  Occupational History  . Occupation: retired  Tobacco Use  . Smoking status: Never Smoker  . Smokeless tobacco: Never Used  Substance and Sexual Activity  . Alcohol use: No  . Drug use: No  . Sexual activity: Never  Other Topics Concern  . Not on file  Social History Narrative  . Not on file   Social Determinants of Health   Financial Resource Strain: Not on file  Food Insecurity: Not on file  Transportation Needs: Not on file  Physical Activity: Not on file  Stress: Not on file  Social Connections: Not on file     Family History: The patient's family history includes Cancer in her brother and sister; Heart attack in her mother.  ROS:   Please see the history of present illness.     All other systems reviewed and are negative.    Recent Labs: 08/14/2019: Hemoglobin 14.4; NT-Pro BNP 383; Platelets 549; TSH 2.000 08/23/2019: BUN 11; Creatinine, Ser 0.91; Potassium 4.2; Sodium 138  Recent Lipid Panel    Component Value Date/Time   CHOL 178 09/23/2016 0912   TRIG 90 09/23/2016 0912   HDL 37 (L) 09/23/2016 0912   CHOLHDL 4.8 09/23/2016 0912   VLDL 18 09/23/2016 0912   LDLCALC 123 (H) 09/23/2016 0912    Physical Exam:    VS:  BP (!) 164/103   Pulse 72   Ht 5\' 5"  (1.651 m)   Wt 292 lb (132.5 kg)   BMI 48.59 kg/m     Wt Readings from Last 3 Encounters:  02/10/20 292 lb (132.5 kg)  12/03/19 282 lb (127.9 kg)  09/02/19 287 lb (130.2 kg)    GEN: In wheelchair well nourished, well  developed, in no acute distress, cough  HEENT: normal  Neck: no JVD, carotid bruits, or masses Cardiac: RRR; no murmurs, rubs, or gallops, 3-4+ edema  Respiratory:  Distant breath sounds, frequent coughing GI: soft, nontender, nondistended, + BS MS: no deformity or atrophy  Skin: warm and dry, no rash Neuro:  Alert and Oriented x 3, Strength and sensation are intact Psych: euthymic mood, full affect   ASSESSMENT:    1. Acute on chronic diastolic CHF (congestive heart failure) (HCC)   2. Persistent atrial fibrillation ([place])   3. Coronary artery disease involving native coronary artery of native heart without angina pectoris   4. Leg swelling    PLAN:    In order of problems listed above:  Acute on chronic diastolic heart failure - We will go ahead and give her 80 mg of Lasix twice daily for the next 3 days and then 80 mg of Lasix in the morning thereafter. - I have asked her grandson to take her to get COVID tested given her dry cough.  She states that she has not left the house but her grandson does state that she goes occasionally to [place] for instance. - Previously we had requested that she take an extra furosemide 20 mg as needed.  At times she would feel near syncope so we did not want to over diurese however with her cough and signs of fluid overload.  Remember her most recent echocardiogram showed normal EF Blood pressure is elevated.  This should reduce as her fluid status reduces as well.  Prior creatinine was 0.9.  Persistent atrial fibrillation -Continues to maintain sinus rhythm, on sotalol.  Watching QTC.  No changes made to medical management.  Stable.  Bradycardia -Metoprolol was decreased to 25 mg once a day at last clinic visit in August 2021.  Currently stable.  No changes made in management.  Chronic anticoagulation -On Coumadin.  Managing closely.  Seen in Coumadin clinic today.  Coronary artery disease -DES to LAD in 2017. -Nuclear stress test in  July 2020 was low risk.  No anginal symptoms currently. -Unfortunately unable to tolerate statin therapy.  No aspirin because she is on warfarin to reduce bleeding risk.  Prior history of TTP - Had seen Dr. Beryle Beams many years ago.  Essential hypertension -Current therapy excellent.  No changes made.  She knows to go to the emergency room if her symptoms worsen.  2-week follow-up  Medication Adjustments/Labs and Tests Ordered: Current medicines are reviewed at length with the patient today.  Concerns regarding medicines are outlined above.  No orders of the defined types were placed in this encounter.  Meds ordered this encounter  Medications  . furosemide (LASIX) 80 MG tablet    Sig: Take 1 tablet (80 mg total) by mouth daily. Take 80 mg twice a day for 3 days then take 80 mg daily    Dispense:  90 tablet    Refill:  3    Patient Instructions  Medication Instructions:  Please increase your Furosemide to 80 mg twice a day for 3 days then take 80 mg once a day thereafter. Continue all other medications as listed.  *If you need a refill on your cardiac medications before your next appointment, please call your pharmacy*  Please go for Covid testing.  Follow-Up: At [facility], you and your health needs are our priority.  As part of our continuing mission to provide you with exceptional heart care, we have created designated Provider Care Teams.  These Care Teams include your primary Cardiologist (physician) and Advanced Practice Providers (APPs -  Physician Assistants and Nurse Practitioners) who all work together to provide you with the care you need, when you need it.  We recommend signing up for the patient portal called "MyChart".  Sign up information is provided on this After Visit Summary.  MyChart is used to connect with patients for Virtual Visits (Telemedicine).  Patients are able to view lab/test results, encounter notes, upcoming appointments, etc.  Non-urgent  messages can be sent to your provider as well.   To learn more about what you can do with MyChart, go to [email or web address].    Your next appointment:   2 week(s)  The format for your next appointment:   In Person  Provider:   [name], MD   Thank you for choosing [facility]!!        Signed, [name], MD  02/10/2020 2:40 PM    [place]

## 2020-02-10 NOTE — Patient Instructions (Signed)
Description   Continue taking Coumadin 5mg  (1 tablet) daily except 7.5 mg (1.5 tablets) on Tuesdays and Fridays. Recheck INR 4 weeks. Call us with any new medications or concerns [phone].

## 2020-02-19 ENCOUNTER — Encounter: Payer: Self-pay | Admitting: Cardiology

## 2020-02-19 ENCOUNTER — Ambulatory Visit (INDEPENDENT_AMBULATORY_CARE_PROVIDER_SITE_OTHER): Payer: Medicare Other | Admitting: Cardiology

## 2020-02-19 ENCOUNTER — Other Ambulatory Visit: Payer: Self-pay | Admitting: Cardiology

## 2020-02-19 ENCOUNTER — Other Ambulatory Visit: Payer: Self-pay

## 2020-02-19 VITALS — BP 120/70 | HR 80 | Ht 65.0 in | Wt 278.0 lb

## 2020-02-19 DIAGNOSIS — I5032 Chronic diastolic (congestive) heart failure: Secondary | ICD-10-CM

## 2020-02-19 DIAGNOSIS — Z79899 Other long term (current) drug therapy: Secondary | ICD-10-CM

## 2020-02-19 DIAGNOSIS — I4819 Other persistent atrial fibrillation: Secondary | ICD-10-CM

## 2020-02-19 DIAGNOSIS — R0602 Shortness of breath: Secondary | ICD-10-CM

## 2020-02-19 NOTE — Patient Instructions (Signed)
Medication Instructions:  Your physician recommends that you continue on your current medications as directed. Please refer to the Current Medication list given to you today.  *If you need a refill on your cardiac medications before your next appointment, please call your pharmacy*   Lab Work: BMET today If you have labs (blood work) drawn today and your tests are completely normal, you will receive your results only by: Marland Kitchen MyChart Message (if you have MyChart) OR . A paper copy in the mail If you have any lab test that is abnormal or we need to change your treatment, we will call you to review the results.   Testing/Procedures: Chest Xray PA and lateral    Follow-Up: At [facility], you and your health needs are our priority.  As part of our continuing mission to provide you with exceptional heart care, we have created designated Provider Care Teams.  These Care Teams include your primary Cardiologist (physician) and Advanced Practice Providers (APPs -  Physician Assistants and Nurse Practitioners) who all work together to provide you with the care you need, when you need it.  We recommend signing up for the patient portal called "MyChart".  Sign up information is provided on this After Visit Summary.  MyChart is used to connect with patients for Virtual Visits (Telemedicine).  Patients are able to view lab/test results, encounter notes, upcoming appointments, etc.  Non-urgent messages can be sent to your provider as well.   To learn more about what you can do with MyChart, go to [email or web address].    Your next appointment:   3 month(s)  The format for your next appointment:   In Person  Provider:   [name], MD   Other Instructions

## 2020-02-19 NOTE — Progress Notes (Signed)
Cardiology Office Note:    Date:  02/19/2020   ID:  [name], DOB [birth year], MRN [id number]  PCP:  Ariel Cara, PA-C  [facility] Cardiologist:  Ariel Furbish, MD  [facility] Electrophysiologist:  Ariel Meredith Leeds, MD   Referring MD: [name], [place], *     History of Present Illness:    [name] is a 75 y.o. female here for follow-up of acute diastolic heart failure.  At last clinic visit we gave her 80 mg of Lasix twice a day for 3 days and then 80 mg in the morning thereafter.  I also asked her grandson to get her COVID tested as well because of her dry cough.  Since her last visit, she is doing better.  She is down about 14 pounds.  Has had some trouble with more cramping with higher Lasix dose originally.  Seems to be doing fairly well with the 80 once a day.  Sometimes she Ariel take only 40 if she has to go out.  No chest pain.  Her grandson brings her again today.  He is quite vigilant with her but he is a truck driver and occasionally needs to go out on the road.  Past Medical History:  Diagnosis Date  . CAD (coronary artery disease)    a. LHC 3/17: mLAD 80, D1 50, pRCA 30, EF 25-35%; PCI: 3.5 x 16 mm Synergy DES to mLAD // Myoview 08/2018: EF 54, normal perfusion; Low Risk  . Chronic diastolic CHF [id number]   Echocardiogram 08/2018: EF 55-60, mild septal basal hypertrophy, Gr 2 DD, elevated LVEDP, mild LAE, mild AI  . Dilated cardiomyopathy >> EF returned to normal in NSR    Probable Tachycardia Mediated // a. Echo 12/16: Mild concentric LVH, EF 25-30%, anteroseptal akinesis, inferoseptal akinesis, anterior akinesis, trivial AI, mild MR, moderate LAE, trivial TR, trivial PI, PASP 33 mmHg  //  b. Echo 2/17: Mild LVH, EF 25-30%, diffuse HK, mild LAE  //  c. Echo 7/17: mild LVH, EF 50-55%, no RWMA, trivial AI, mild to mod LAE, mild RAE  . Febrile seizure ([place]) 2004   "during TTP thing; cause my temp was 106"  . High cholesterol   .  History of blood transfusion 2004   "several; related to TTP"  . Hypertension   . Hypothyroidism   . Morbid obesity ([place]) 01/07/2013  . Osteoarthritis of back   . Paroxysmal atrial fibrillation ([place]) 08/01/2011   a. started on Sotalol during 10/2015 admission (Tikosyn too expensive)  . Pneumonia ?1999  . Scoliosis   . Thyroid nodule    "grew back after 2 partial thyroid surgeries" (09/29/2015)  . 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 ABDOMINAL EXPLORATION SURGERY     "opened me up 2-3 times for blocked intestines"  . APPENDECTOMY    . BACK SURGERY    . BASAL CELL CARCINOMA EXCISION Right    "neck"  . CARDIAC CATHETERIZATION N/A 04/17/2015   Procedure: Left Heart Cath and Coronary Angiography;  Surgeon: Ariel Booze, MD;  Location: [place] [facility];  Service: Cardiovascular;  Laterality: N/A;  . CARDIAC CATHETERIZATION  04/17/2015   Procedure: Coronary Stent Intervention;  Surgeon: Ariel Booze, MD;  Location: [place] [facility];  Service: Cardiovascular;;  . CARDIOVERSION  06/2002; 03/2005; 05/2005   Archie Endo 06/16/2010  . CARDIOVERSION N/A 10/03/2015   Procedure: CARDIOVERSION;  Surgeon: Deboraha Sprang, MD;  Location: [facility];  Service: Cardiovascular;  Laterality: N/A;  . CATARACT EXTRACTION W/ INTRAOCULAR LENS  IMPLANT, BILATERAL Bilateral   . CHOLECYSTECTOMY OPEN    . CORONARY ANGIOPLASTY    . DILATION AND CURETTAGE OF UTERUS     S/P "miscarriage"  . FOOT SURGERY Right    "for nerve damage"  . SALIVARY STONE REMOVAL Left    "had tumor wrapped around it"  . SPLENECTOMY, TOTAL     Archie Endo 06/16/2010  . TEE WITHOUT CARDIOVERSION N/A 09/30/2015   Procedure: TRANSESOPHAGEAL ECHOCARDIOGRAM (TEE);  Surgeon: Skeet Latch, MD;  Location: [place];  Service: Cardiovascular;  Laterality: N/A;  . THORACIC DISCECTOMY  05/2002  . THYROIDECTOMY, PARTIAL  X 2   "grew back"  . TONSILLECTOMY    . TUBAL  LIGATION    . TUMOR EXCISION     "benign tumor in my neck"  . VAGINAL HYSTERECTOMY      Current Medications: Current Meds  Medication Sig  . Cholecalciferol (VITAMIN D3) 2000 units capsule Take 2,000 Units by mouth every morning.  . furosemide (LASIX) 80 MG tablet Take 1 tablet (80 mg total) by mouth daily. Take 80 mg twice a day for 3 days then take 80 mg daily (Patient taking differently: Take 40 mg by mouth daily. Take 80 mg twice a day for 3 days then take 80 mg daily)  . isosorbide mononitrate (IMDUR) 30 MG 24 hr tablet TAKE 1 TABLET BY MOUTH EVERY DAY  . levothyroxine (SYNTHROID, LEVOTHROID) 50 MCG tablet Take 50 mcg by mouth daily before breakfast.  . metoprolol succinate (TOPROL-XL) 25 MG 24 hr tablet Take 1 tablet (25 mg total) by mouth daily. Take with or immediately following a meal.  . nitroGLYCERIN (NITROSTAT) 0.4 MG SL tablet PLACE 1 TABLET (0.4 MG TOTAL) UNDER THE TONGUE EVERY 5 (FIVE) MINUTES AS NEEDED FOR CHEST PAIN.  Marland Kitchen potassium chloride (KLOR-CON) 10 MEQ tablet Take 10 mEq by mouth daily.  . sotalol (BETAPACE) 120 MG tablet TAKE 1 TABLET (120 MG TOTAL) BY MOUTH 2 (TWO) TIMES DAILY.  Marland Kitchen warfarin (COUMADIN) 5 MG tablet TAKE AS DIRECTED BY COUMADIN CLINIC     Allergies:   Ceftriaxone, Penicillins, Levofloxacin, Levofloxacin, Oxycodone, Tape, Alendronate, Aspirin, Oxycodone-acetaminophen, and Penicillin g   Social History   Socioeconomic History  . Marital status: Married    Spouse name: Not on file  . Number of children: Not on file  . Years of education: Not on file  . Highest education level: Not on file  Occupational History  . Occupation: retired  Tobacco Use  . Smoking status: Never Smoker  . Smokeless tobacco: Never Used  Substance and Sexual Activity  . Alcohol use: No  . Drug use: No  . Sexual activity: Never  Other Topics Concern  . Not on file  Social History Narrative  . Not on file   Social Determinants of Health   Financial Resource Strain:  Not on file  Food Insecurity: Not on file  Transportation Needs: Not on file  Physical Activity: Not on file  Stress: Not on file  Social Connections: Not on file     Family History: The patient's family history includes Cancer in her brother and sister; Heart attack in her mother.  ROS:   Please see the history of present illness.    Positive for cramping.  No chest pain, shortness of breath has improved.  All other systems reviewed and are negative.  EKGs/Labs/Other Studies Reviewed:      EKG:  EKG is  ordered today.  The ekg ordered today demonstrates sinus rhythm with PACs 82 bpm with QTC 497.  I do see P waves occasionally preceding QRS complex.  Recent Labs: 08/14/2019: Hemoglobin 14.4; NT-Pro BNP 383; Platelets 549; TSH 2.000 08/23/2019: BUN 11; Creatinine, Ser 0.91; Potassium 4.2; Sodium 138  Recent Lipid Panel    Component Value Date/Time   CHOL 178 09/23/2016 0912   TRIG 90 09/23/2016 0912   HDL 37 (L) 09/23/2016 0912   CHOLHDL 4.8 09/23/2016 0912   VLDL 18 09/23/2016 0912   LDLCALC 123 (H) 09/23/2016 0912     Risk Assessment/Calculations:      Physical Exam:    VS:  BP 120/70 (BP Location: Left Arm, Patient Position: Sitting, Cuff Size: Normal)   Pulse 80   Ht 5\' 5"  (1.651 m)   Wt 278 lb (126.1 kg)   SpO2 92%   BMI 46.26 kg/m     Wt Readings from Last 3 Encounters:  02/19/20 278 lb (126.1 kg)  02/10/20 292 lb (132.5 kg)  12/03/19 282 lb (127.9 kg)     GEN: In wheelchair well nourished, well developed in no acute distress HEENT: Normal NECK: No JVD; No carotid bruits LYMPHATICS: No lymphadenopathy CARDIAC: Occasional ectopy RRR, no murmurs, rubs, gallops RESPIRATORY:  Clear to auscultation without rales, wheezing or rhonchi  ABDOMEN: Soft, non-tender, non-distended MUSCULOSKELETAL:  Chronic LE edema; No deformity  SKIN: Warm and dry NEUROLOGIC:  Alert and oriented x 3 PSYCHIATRIC:  Normal affect   ASSESSMENT:    1. Persistent atrial  fibrillation ([place])   2. Chronic diastolic CHF (congestive heart failure) ([place])   3. Medication management   4. Shortness of breath    PLAN:    In order of problems listed above:  Acute diastolic heart failure/shortness of breath - Much improved since last visit.  Weight was 292, now 278 - Continue with Lasix 80 mg once a day. - I Ariel check a basic metabolic profile to see where her potassium is today.  She is having some cramping as a side effect.  We may need to increase her potassium supplementation. -I Ariel check a chest x-ray PA and lateral  ED - Continue to encourage weight loss.  Paroxysmal atrial fibrillation - Maintaining sinus rhythm on sotalol.  Monitoring QTC.  TC today was 497  Bradycardia - Metoprolol previously decreased to 25 mg a day back in August 2021.  Stable.  Chronic anticoagulation - On Coumadin  Coronary artery disease - Prior drug-eluting stent to LAD in 2017.  Nuclear stress test July 2020 was low risk.  Prior history of TTP - Previously seen Dr. Beryle Beams.  She was told not to take COVID-vaccine because of this.         Medication Adjustments/Labs and Tests Ordered: Current medicines are reviewed at length with the patient today.  Concerns regarding medicines are outlined above.  Orders Placed This Encounter  Procedures  . DG Chest 2 View  . Basic metabolic panel  . EKG 12-Lead   No orders of the defined types were placed in this encounter.   Patient Instructions  Medication Instructions:  Your physician recommends that you continue on your current medications as directed. Please refer to the Current Medication list given to you today.  *If you need a refill on your cardiac medications before your next appointment, please call your pharmacy*   Lab Work: BMET today If you have labs (blood work) drawn today and your tests are completely normal,  you Ariel receive your results only by: Marland Kitchen MyChart Message (if you have MyChart) OR . A  paper copy in the mail If you have any lab test that is abnormal or we need to change your treatment, we Ariel call you to review the results.   Testing/Procedures: Chest Xray PA and lateral    Follow-Up: At [facility], you and your health needs are our priority.  As part of our continuing mission to provide you with exceptional heart care, we have created designated Provider Care Teams.  These Care Teams include your primary Cardiologist (physician) and Advanced Practice Providers (APPs -  Physician Assistants and Nurse Practitioners) who all work together to provide you with the care you need, when you need it.  We recommend signing up for the patient portal called "MyChart".  Sign up information is provided on this After Visit Summary.  MyChart is used to connect with patients for Virtual Visits (Telemedicine).  Patients are able to view lab/test results, encounter notes, upcoming appointments, etc.  Non-urgent messages can be sent to your provider as well.   To learn more about what you can do with MyChart, go to [email or web address].    Your next appointment:   3 month(s)  The format for your next appointment:   In Person  Provider:   [name], MD   Other Instructions      Signed, Ariel Furbish, MD  02/19/2020 3:32 PM    [place]

## 2020-02-20 LAB — BASIC METABOLIC PANEL
BUN/Creatinine Ratio: 15 (ref 12–28)
BUN: 15 mg/dL (ref 8–27)
CO2: 28 mmol/L (ref 20–29)
Calcium: 9 mg/dL (ref 8.7–10.3)
Chloride: 97 mmol/L (ref 96–106)
Creatinine, Ser: 1.01 mg/dL — ABNORMAL HIGH (ref 0.57–1.00)
GFR calc Af Amer: 63 mL/min/{1.73_m2} (ref >59)
GFR calc non Af Amer: 55 mL/min/{1.73_m2} — ABNORMAL LOW (ref >59)
Glucose: 96 mg/dL (ref 65–99)
Potassium: 3.6 mmol/L (ref 3.5–5.2)
Sodium: 142 mmol/L (ref 134–144)

## 2020-02-24 ENCOUNTER — Ambulatory Visit: Payer: Medicare Other | Admitting: Cardiology

## 2020-03-05 ENCOUNTER — Ambulatory Visit (INDEPENDENT_AMBULATORY_CARE_PROVIDER_SITE_OTHER): Payer: Medicare Other | Admitting: *Deleted

## 2020-03-05 ENCOUNTER — Other Ambulatory Visit: Payer: Self-pay

## 2020-03-05 DIAGNOSIS — Z5181 Encounter for therapeutic drug level monitoring: Secondary | ICD-10-CM | POA: Diagnosis not present

## 2020-03-05 DIAGNOSIS — I4819 Other persistent atrial fibrillation: Secondary | ICD-10-CM | POA: Diagnosis not present

## 2020-03-05 LAB — POCT INR: INR: 2.6 (ref 2.0–3.0)

## 2020-03-05 NOTE — Patient Instructions (Signed)
Description   Continue taking Coumadin 5mg  (1 tablet) daily except 7.5 mg (1.5 tablets) on Tuesdays and Fridays. Recheck INR 5 weeks. Call us with any new medications or concerns [phone].

## 2020-03-13 ENCOUNTER — Other Ambulatory Visit: Payer: Self-pay | Admitting: Cardiology

## 2020-04-15 ENCOUNTER — Other Ambulatory Visit: Payer: Self-pay

## 2020-04-15 ENCOUNTER — Ambulatory Visit (INDEPENDENT_AMBULATORY_CARE_PROVIDER_SITE_OTHER): Payer: Medicare Other

## 2020-04-15 DIAGNOSIS — Z5181 Encounter for therapeutic drug level monitoring: Secondary | ICD-10-CM | POA: Diagnosis not present

## 2020-04-15 DIAGNOSIS — I4819 Other persistent atrial fibrillation: Secondary | ICD-10-CM

## 2020-04-15 LAB — POCT INR: INR: 3.1 — AB (ref 2.0–3.0)

## 2020-04-15 NOTE — Patient Instructions (Signed)
Description   Take 1/2 tablet today, then resume same dosage Coumadin 5mg  (1 tablet) daily except 7.5 mg (1.5 tablets) on Tuesdays and Fridays. Recheck INR 4 weeks. Call us with any new medications or concerns [phone].

## 2020-04-22 ENCOUNTER — Other Ambulatory Visit: Payer: Self-pay

## 2020-04-22 MED ORDER — WARFARIN SODIUM 5 MG PO TABS: ORAL_TABLET | ORAL | 0 refills | Status: DC

## 2020-04-30 ENCOUNTER — Other Ambulatory Visit: Payer: Self-pay | Admitting: Physician Assistant

## 2020-05-05 ENCOUNTER — Telehealth: Payer: Self-pay | Admitting: Cardiology

## 2020-05-05 NOTE — Telephone Encounter (Signed)
Returned call to pt. She has been made aware that we sent in refill for Sotalol in 03/2020 for 90 days with 2 refills.  Pt appreciative of the call back.

## 2020-05-05 NOTE — Telephone Encounter (Signed)
 *  STAT* If patient is at the pharmacy, call can be transferred to refill team.   1. Which medications need to be refilled? (please list name of each medication and dose if known)   sotalol (BETAPACE) 120 MG tablet  2. Which pharmacy/location (including street and city if local pharmacy) is medication to be sent to?  [id number] - [place], [place]  3. Do they need a 30 day or 90 day supply? 90 day

## 2020-05-08 ENCOUNTER — Encounter: Payer: Self-pay | Admitting: Cardiology

## 2020-05-08 ENCOUNTER — Ambulatory Visit (INDEPENDENT_AMBULATORY_CARE_PROVIDER_SITE_OTHER): Payer: Medicare Other | Admitting: *Deleted

## 2020-05-08 ENCOUNTER — Ambulatory Visit (INDEPENDENT_AMBULATORY_CARE_PROVIDER_SITE_OTHER): Payer: Medicare Other | Admitting: Cardiology

## 2020-05-08 ENCOUNTER — Other Ambulatory Visit: Payer: Self-pay

## 2020-05-08 VITALS — BP 130/80 | HR 80 | Ht 65.0 in | Wt 282.0 lb

## 2020-05-08 DIAGNOSIS — I4819 Other persistent atrial fibrillation: Secondary | ICD-10-CM

## 2020-05-08 DIAGNOSIS — Z5181 Encounter for therapeutic drug level monitoring: Secondary | ICD-10-CM | POA: Diagnosis not present

## 2020-05-08 DIAGNOSIS — I5032 Chronic diastolic (congestive) heart failure: Secondary | ICD-10-CM | POA: Diagnosis not present

## 2020-05-08 LAB — POCT INR: INR: 2.4 (ref 2.0–3.0)

## 2020-05-08 NOTE — Patient Instructions (Signed)
Description    Continue on same dosage Coumadin 5mg  (1 tablet) daily except 7.5 mg (1.5 tablets) on Tuesdays and Fridays. Recheck INR 5 weeks. Call us with any new medications or concerns [phone].

## 2020-05-08 NOTE — Progress Notes (Signed)
Cardiology Office Note:    Date:  05/11/2020   ID:  KAMARIYA BLEVENS, DOB [birth year], MRN [id number]  PCP:  Fulbright, Virginia E, [place]  Cardiologist:  [name], MD  Advanced Practice Provider:  No care team member to display Electrophysiologist:  Will Meredith Leeds, MD       Referring MD: Belva Bertin, [place], *     History of Present Illness:    [name] is a 75 y.o. female here for follow-up of acute diastolic heart failure  Previously in January we gave her additional Lasix.  Lost about 14 pounds.  Felt better.  Yolanda Bonine is very vigilant to her, he is a Administrator.  Overall no major changes.  Doing well.  Past Medical History:  Diagnosis Date  . CAD (coronary artery disease)    a. LHC 3/17: mLAD 80, D1 50, pRCA 30, EF 25-35%; PCI: 3.5 x 16 mm Synergy DES to mLAD // Myoview 08/2018: EF 54, normal perfusion; Low Risk  . Chronic diastolic CHF 1/95/0932   Echocardiogram 08/2018: EF 55-60, mild septal basal hypertrophy, Gr 2 DD, elevated LVEDP, mild LAE, mild AI  . Dilated cardiomyopathy >> EF returned to normal in NSR    Probable Tachycardia Mediated // a. Echo 12/16: Mild concentric LVH, EF 25-30%, anteroseptal akinesis, inferoseptal akinesis, anterior akinesis, trivial AI, mild MR, moderate LAE, trivial TR, trivial PI, PASP 33 mmHg  //  b. Echo 2/17: Mild LVH, EF 25-30%, diffuse HK, mild LAE  //  c. Echo 7/17: mild LVH, EF 50-55%, no RWMA, trivial AI, mild to mod LAE, mild RAE  . Febrile seizure ([place]) 2004   "during TTP thing; cause my temp was 106"  . High cholesterol   . History of blood transfusion 2004   "several; related to TTP"  . Hypertension   . Hypothyroidism   . Morbid obesity ([place]) 01/07/2013  . Osteoarthritis of back   . Paroxysmal atrial fibrillation ([place]) 08/01/2011   a. started on Sotalol during 10/2015 admission (Tikosyn too expensive)  . Pneumonia ?1999  . Scoliosis   . Thyroid nodule    "grew back  after 2 partial thyroid surgeries" (09/29/2015)  . 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 ABDOMINAL EXPLORATION SURGERY     "opened me up 2-3 times for blocked intestines"  . APPENDECTOMY    . BACK SURGERY    . BASAL CELL CARCINOMA EXCISION Right    "neck"  . CARDIAC CATHETERIZATION N/A 04/17/2015   Procedure: Left Heart Cath and Coronary Angiography;  Surgeon: Jettie Booze, MD;  Location: [place] [facility];  Service: Cardiovascular;  Laterality: N/A;  . CARDIAC CATHETERIZATION  04/17/2015   Procedure: Coronary Stent Intervention;  Surgeon: Jettie Booze, MD;  Location: [place] [facility];  Service: Cardiovascular;;  . CARDIOVERSION  06/2002; 03/2005; 05/2005   Archie Endo 06/16/2010  . CARDIOVERSION N/A 10/03/2015   Procedure: CARDIOVERSION;  Surgeon: Deboraha Sprang, MD;  Location: [place];  Service: Cardiovascular;  Laterality: N/A;  . CATARACT EXTRACTION W/ INTRAOCULAR LENS  IMPLANT, BILATERAL Bilateral   . CHOLECYSTECTOMY OPEN    . CORONARY ANGIOPLASTY    . DILATION AND CURETTAGE OF UTERUS     S/P "miscarriage"  . FOOT SURGERY Right    "for nerve damage"  . SALIVARY STONE REMOVAL Left    "had tumor wrapped around it"  . SPLENECTOMY, TOTAL     /  notes 06/16/2010  . TEE WITHOUT CARDIOVERSION N/A 09/30/2015   Procedure: TRANSESOPHAGEAL ECHOCARDIOGRAM (TEE);  Surgeon: Skeet Latch, MD;  Location: [place];  Service: Cardiovascular;  Laterality: N/A;  . THORACIC DISCECTOMY  05/2002  . THYROIDECTOMY, PARTIAL  X 2   "grew back"  . TONSILLECTOMY    . TUBAL LIGATION    . TUMOR EXCISION     "benign tumor in my neck"  . VAGINAL HYSTERECTOMY      Current Medications: Current Meds  Medication Sig  . Cholecalciferol (VITAMIN D3) 2000 units capsule Take 2,000 Units by mouth every morning.  . furosemide (LASIX) 40 MG tablet Take 40 mg by mouth daily.  . isosorbide mononitrate (IMDUR) 30 MG 24 hr tablet TAKE 1  TABLET BY MOUTH EVERY DAY  . levothyroxine (SYNTHROID, LEVOTHROID) 50 MCG tablet Take 50 mcg by mouth daily before breakfast.  . metoprolol succinate (TOPROL-XL) 25 MG 24 hr tablet TAKE 1 TABLET (25 MG TOTAL) BY MOUTH DAILY. TAKE WITH OR IMMEDIATELY FOLLOWING A MEAL.  . nitroGLYCERIN (NITROSTAT) 0.4 MG SL tablet PLACE 1 TABLET (0.4 MG TOTAL) UNDER THE TONGUE EVERY 5 (FIVE) MINUTES AS NEEDED FOR CHEST PAIN.  Marland Kitchen potassium chloride (KLOR-CON) 10 MEQ tablet Take 10 mEq by mouth daily.  . sotalol (BETAPACE) 120 MG tablet TAKE 1 TABLET (120 MG TOTAL) BY MOUTH 2 (TWO) TIMES DAILY.  Marland Kitchen warfarin (COUMADIN) 5 MG tablet Take as directed by anticoagulation clinic     Allergies:   Ceftriaxone, Penicillins, Levofloxacin, Levofloxacin, Oxycodone, Tape, Alendronate, Aspirin, Oxycodone-acetaminophen, and Penicillin g   Social History   Socioeconomic History  . Marital status: Married    Spouse name: Not on file  . Number of children: Not on file  . Years of education: Not on file  . Highest education level: Not on file  Occupational History  . Occupation: retired  Tobacco Use  . Smoking status: Never Smoker  . Smokeless tobacco: Never Used  Substance and Sexual Activity  . Alcohol use: No  . Drug use: No  . Sexual activity: Never  Other Topics Concern  . Not on file  Social History Narrative  . Not on file   Social Determinants of Health   Financial Resource Strain: Not on file  Food Insecurity: Not on file  Transportation Needs: Not on file  Physical Activity: Not on file  Stress: Not on file  Social Connections: Not on file     Family History: The patient's family history includes Cancer in her brother and sister; Heart attack in her mother.  ROS:   Please see the history of present illness.     All other systems reviewed and are negative.  EKGs/Labs/Other Studies Reviewed:     Recent Labs: 08/14/2019: Hemoglobin 14.4; NT-Pro BNP 383; Platelets 549; TSH 2.000 02/19/2020: BUN 15;  Creatinine, Ser 1.01; Potassium 3.6; Sodium 142  Recent Lipid Panel    Component Value Date/Time   CHOL 178 09/23/2016 0912   TRIG 90 09/23/2016 0912   HDL 37 (L) 09/23/2016 0912   CHOLHDL 4.8 09/23/2016 0912   VLDL 18 09/23/2016 0912   LDLCALC 123 (H) 09/23/2016 0912     Risk Assessment/Calculations:      Physical Exam:    VS:  BP 130/80 (BP Location: Left Arm, Patient Position: Sitting, Cuff Size: Normal)   Pulse 80   Ht 5\' 5"  (1.651 m)   Wt 282 lb (127.9 kg)   SpO2 93%   BMI 46.93 kg/m     Wt Readings  from Last 3 Encounters:  05/08/20 282 lb (127.9 kg)  02/19/20 278 lb (126.1 kg)  02/10/20 292 lb (132.5 kg)     GEN:  Well nourished, well developed in no acute distress in wheel chair HEENT: Normal NECK: No JVD; No carotid bruits LYMPHATICS: No lymphadenopathy CARDIAC: RRR, no murmurs, rubs, gallops RESPIRATORY:  Clear to auscultation without rales, wheezing or rhonchi  ABDOMEN: Soft, non-tender, non-distended MUSCULOSKELETAL:  No edema; No deformity  SKIN: Warm and dry NEUROLOGIC:  Alert and oriented x 3 PSYCHIATRIC:  Normal affect   ASSESSMENT:    1. Persistent atrial fibrillation ([place])   2. Chronic diastolic CHF (congestive heart failure) ([facility])    PLAN:    In order of problems listed above:   Acute diastolic heart failure/shortness of breath - Much improved since last visit.  Weight was 292, now 278 - Continue with Lasix as directed.  She knows to take an extra pill if necessary for increased swelling.  Morbid obesity - Continue to encourage weight loss.  Watch fast food.  Paroxysmal atrial fibrillation - Maintaining sinus rhythm on sotalol.  Monitoring QTC.    Q TC today was 497 no changes.  Bradycardia - Metoprolol previously decreased to 25 mg a day back in August 2021.  Stable.  Chronic anticoagulation - On Coumadin no bleeding.  Coronary artery disease - Prior drug-eluting stent to LAD in 2017.  Nuclear stress test July 2020 was  low risk.  Prior history of TTP - Previously seen Dr. Beryle Beams.  She was told not to take COVID-vaccine because of this.      Medication Adjustments/Labs and Tests Ordered: Current medicines are reviewed at length with the patient today.  Concerns regarding medicines are outlined above.  No orders of the defined types were placed in this encounter.  No orders of the defined types were placed in this encounter.   Patient Instructions  Medication Instructions:  The current medical regimen is effective;  continue present plan and medications.  *If you need a refill on your cardiac medications before your next appointment, please call your pharmacy*  Follow-Up: At [facility], you and your health needs are our priority.  As part of our continuing mission to provide you with exceptional heart care, we have created designated Provider Care Teams.  These Care Teams include your primary Cardiologist (physician) and Advanced Practice Providers (APPs -  Physician Assistants and Nurse Practitioners) who all work together to provide you with the care you need, when you need it.  We recommend signing up for the patient portal called "MyChart".  Sign up information is provided on this After Visit Summary.  MyChart is used to connect with patients for Virtual Visits (Telemedicine).  Patients are able to view lab/test results, encounter notes, upcoming appointments, etc.  Non-urgent messages can be sent to your provider as well.   To learn more about what you can do with MyChart, go to [email or web address].    Your next appointment:   6 month(s)  The format for your next appointment:   In Person  Provider:   [name], MD   Thank you for choosing [facility]!!        Signed, [name], MD  05/11/2020 4:10 PM    [place]

## 2020-05-08 NOTE — Patient Instructions (Signed)

## 2020-06-19 ENCOUNTER — Ambulatory Visit (INDEPENDENT_AMBULATORY_CARE_PROVIDER_SITE_OTHER): Payer: Medicare Other

## 2020-06-19 ENCOUNTER — Other Ambulatory Visit: Payer: Self-pay

## 2020-06-19 DIAGNOSIS — I4819 Other persistent atrial fibrillation: Secondary | ICD-10-CM | POA: Diagnosis not present

## 2020-06-19 DIAGNOSIS — Z5181 Encounter for therapeutic drug level monitoring: Secondary | ICD-10-CM

## 2020-06-19 LAB — POCT INR: INR: 2.4 (ref 2.0–3.0)

## 2020-06-19 NOTE — Patient Instructions (Signed)
Description   °Continue on same dosage Coumadin 5mg (1 tablet) daily except 7.5 mg (1.5 tablets) on Tuesdays and Fridays. Recheck INR 6 weeks. Call us with any new medications or concerns #[phone]. °  ° ° °

## 2020-07-27 ENCOUNTER — Other Ambulatory Visit: Payer: Self-pay | Admitting: Cardiology

## 2020-07-28 NOTE — Telephone Encounter (Signed)
Prescription refill request received for warfarin [facility]: [facility] Next INR check: 7/1 Warfarin tablet strength: 5mg 

## 2020-07-31 ENCOUNTER — Other Ambulatory Visit: Payer: Self-pay

## 2020-07-31 ENCOUNTER — Ambulatory Visit (INDEPENDENT_AMBULATORY_CARE_PROVIDER_SITE_OTHER): Payer: Medicare Other

## 2020-07-31 DIAGNOSIS — Z5181 Encounter for therapeutic drug level monitoring: Secondary | ICD-10-CM

## 2020-07-31 DIAGNOSIS — I4819 Other persistent atrial fibrillation: Secondary | ICD-10-CM | POA: Diagnosis not present

## 2020-07-31 LAB — POCT INR: INR: 2.3 (ref 2.0–3.0)

## 2020-07-31 NOTE — Patient Instructions (Signed)
Description   Continue on same dosage Coumadin 5mg  (1 tablet) daily except 7.5 mg (1.5 tablets) on Tuesdays and Fridays. Recheck INR 6 weeks. Call us with any new medications or concerns [phone].

## 2020-08-11 IMAGING — CR DG CHEST 2V
2 series · 2 of 2 positions shown · non-contrast
Comparison: October 16, 2017

CLINICAL DATA: Shortness of breath with cough and congestion

EXAM:
CHEST - 2 VIEW

[w chest pa]
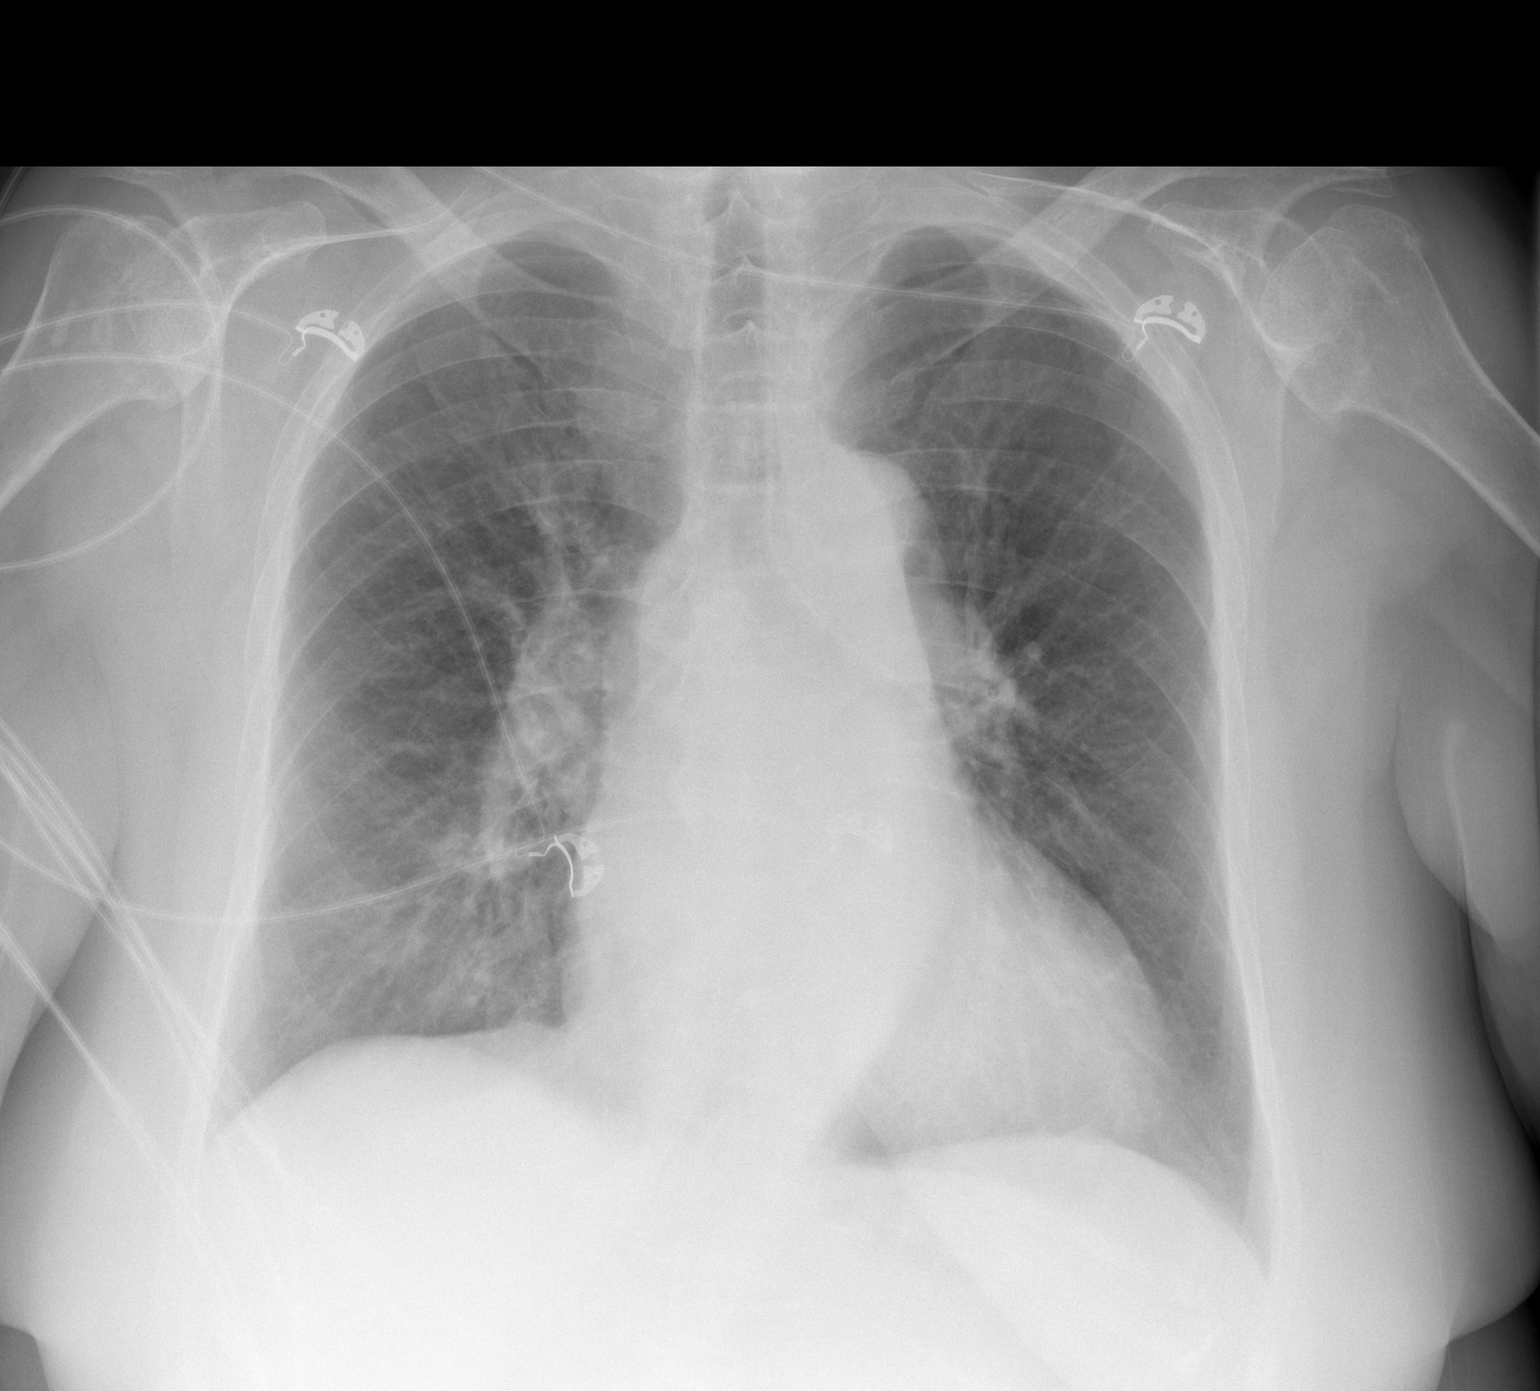

[w chest lat]
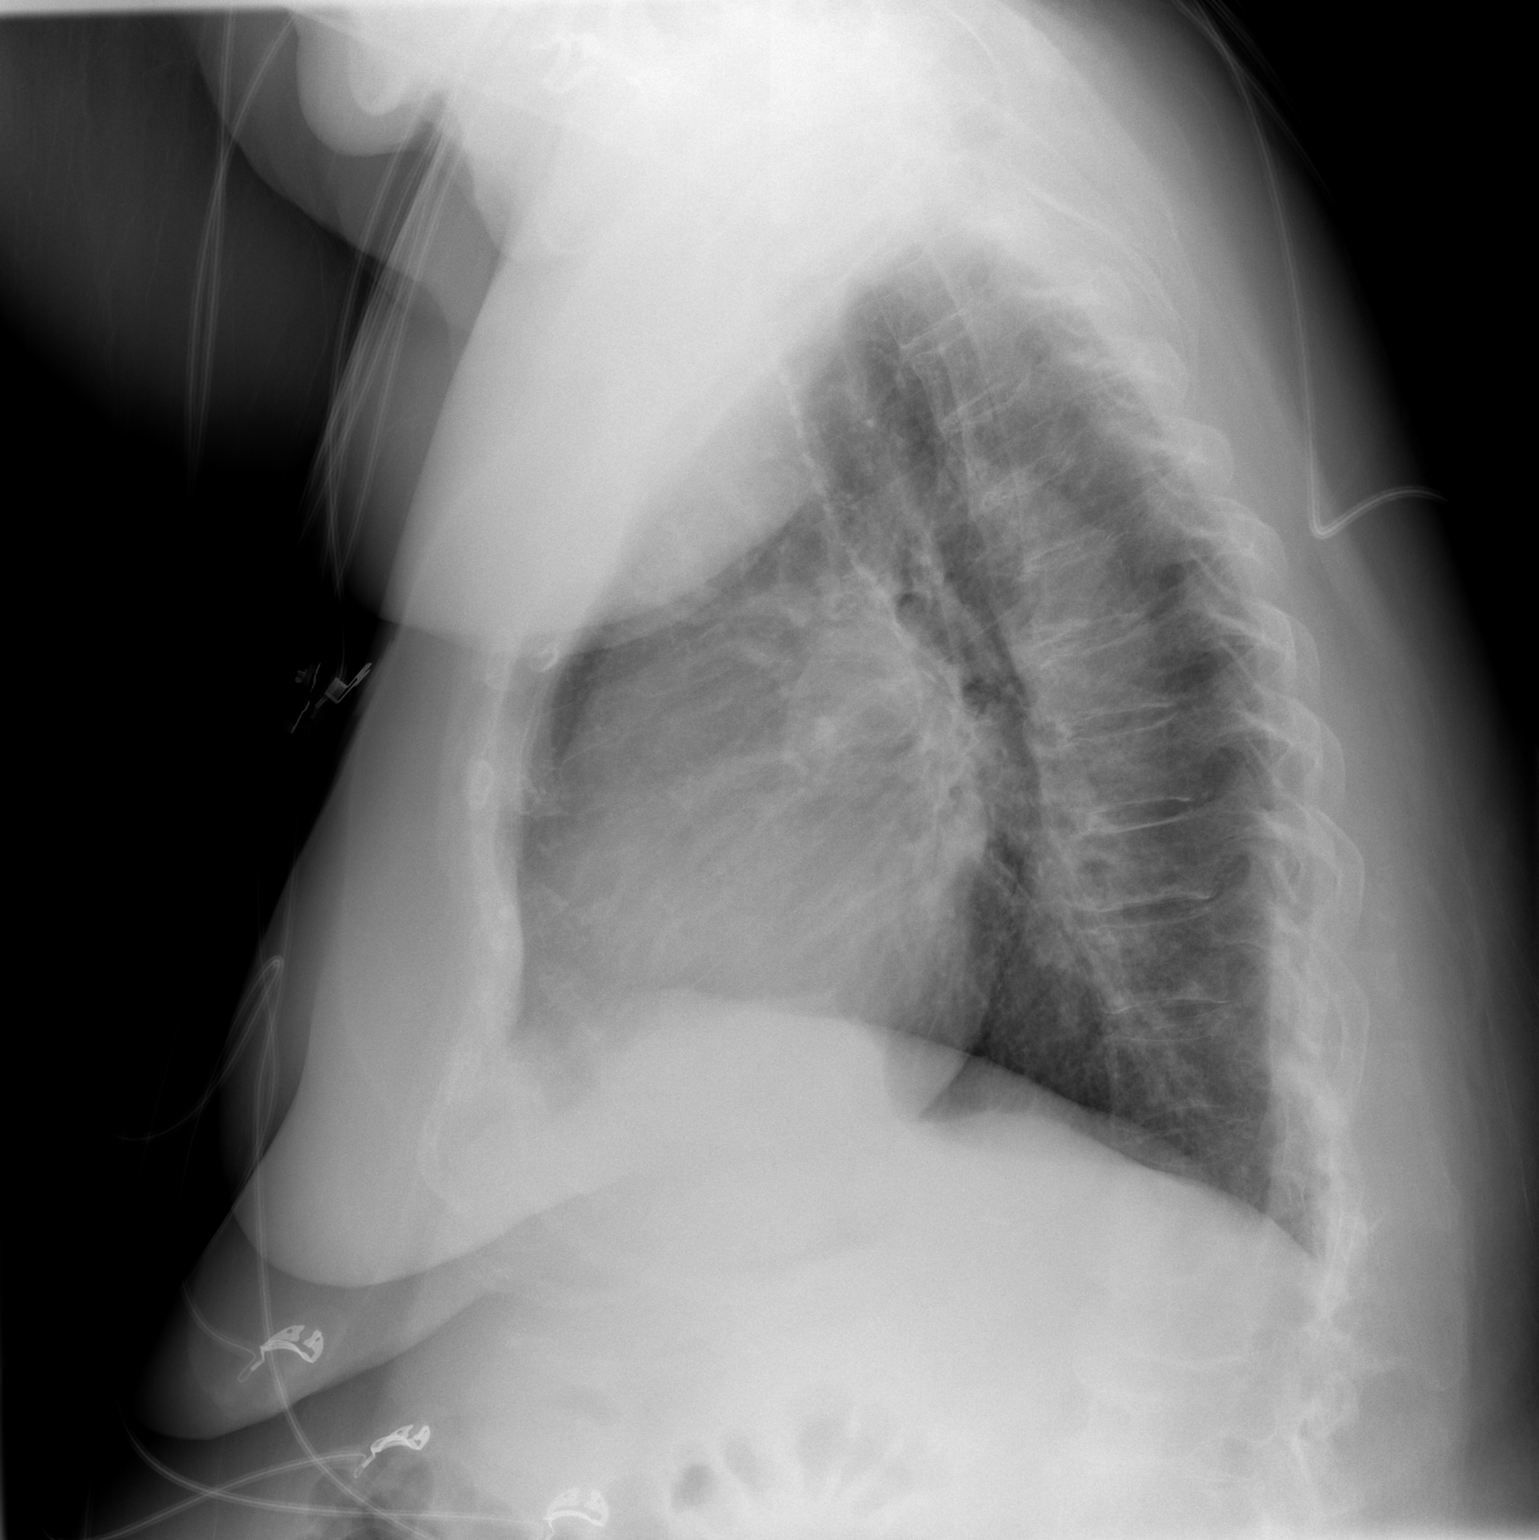

[2 of 2 positions shown; findings below may reference images not displayed]

FINDINGS: There is no evident edema or consolidation. Heart is slightly
enlarged with pulmonary vascularity normal. No adenopathy. No
evident bone lesions.
IMPRESSION: Mild cardiac enlargement.  No edema or consolidation.

## 2020-08-15 ENCOUNTER — Other Ambulatory Visit: Payer: Self-pay

## 2020-08-15 ENCOUNTER — Inpatient Hospital Stay (HOSPITAL_BASED_OUTPATIENT_CLINIC_OR_DEPARTMENT_OTHER)
Admission: EM | Admit: 2020-08-15 | Discharge: 2020-08-20 | DRG: 291 | Disposition: A | Discharge status: Home Health | Payer: Medicare Other | Attending: Internal Medicine | Admitting: Internal Medicine

## 2020-08-15 ENCOUNTER — Encounter (HOSPITAL_BASED_OUTPATIENT_CLINIC_OR_DEPARTMENT_OTHER): Payer: Self-pay

## 2020-08-15 ENCOUNTER — Emergency Department (HOSPITAL_BASED_OUTPATIENT_CLINIC_OR_DEPARTMENT_OTHER): Payer: Medicare Other

## 2020-08-15 DIAGNOSIS — I130 Hypertensive heart and chronic kidney disease with heart failure and stage 1 through stage 4 chronic kidney disease, or unspecified chronic kidney disease: Principal | ICD-10-CM | POA: Diagnosis present

## 2020-08-15 DIAGNOSIS — I5033 Acute on chronic diastolic (congestive) heart failure: Secondary | ICD-10-CM

## 2020-08-15 DIAGNOSIS — Z8249 Family history of ischemic heart disease and other diseases of the circulatory system: Secondary | ICD-10-CM

## 2020-08-15 DIAGNOSIS — I5043 Acute on chronic combined systolic (congestive) and diastolic (congestive) heart failure: Secondary | ICD-10-CM | POA: Diagnosis present

## 2020-08-15 DIAGNOSIS — E039 Hypothyroidism, unspecified: Secondary | ICD-10-CM | POA: Diagnosis not present

## 2020-08-15 DIAGNOSIS — Z885 Allergy status to narcotic agent status: Secondary | ICD-10-CM

## 2020-08-15 DIAGNOSIS — Z888 Allergy status to other drugs, medicaments and biological substances status: Secondary | ICD-10-CM

## 2020-08-15 DIAGNOSIS — Z881 Allergy status to other antibiotic agents status: Secondary | ICD-10-CM

## 2020-08-15 DIAGNOSIS — Z955 Presence of coronary angioplasty implant and graft: Secondary | ICD-10-CM

## 2020-08-15 DIAGNOSIS — E669 Obesity, unspecified: Secondary | ICD-10-CM | POA: Diagnosis present

## 2020-08-15 DIAGNOSIS — Z809 Family history of malignant neoplasm, unspecified: Secondary | ICD-10-CM

## 2020-08-15 DIAGNOSIS — I4819 Other persistent atrial fibrillation: Secondary | ICD-10-CM | POA: Diagnosis not present

## 2020-08-15 DIAGNOSIS — Z7901 Long term (current) use of anticoagulants: Secondary | ICD-10-CM

## 2020-08-15 DIAGNOSIS — I509 Heart failure, unspecified: Secondary | ICD-10-CM

## 2020-08-15 DIAGNOSIS — N1831 Chronic kidney disease, stage 3a: Secondary | ICD-10-CM | POA: Diagnosis present

## 2020-08-15 DIAGNOSIS — E876 Hypokalemia: Secondary | ICD-10-CM | POA: Diagnosis not present

## 2020-08-15 DIAGNOSIS — Z886 Allergy status to analgesic agent status: Secondary | ICD-10-CM

## 2020-08-15 DIAGNOSIS — I420 Dilated cardiomyopathy: Secondary | ICD-10-CM | POA: Diagnosis present

## 2020-08-15 DIAGNOSIS — Z880 Allergy status to penicillin: Secondary | ICD-10-CM

## 2020-08-15 DIAGNOSIS — Z6841 Body Mass Index (BMI) 40.0 and over, adult: Secondary | ICD-10-CM

## 2020-08-15 DIAGNOSIS — I2510 Atherosclerotic heart disease of native coronary artery without angina pectoris: Secondary | ICD-10-CM

## 2020-08-15 DIAGNOSIS — E7800 Pure hypercholesterolemia, unspecified: Secondary | ICD-10-CM | POA: Diagnosis present

## 2020-08-15 DIAGNOSIS — Z85828 Personal history of other malignant neoplasm of skin: Secondary | ICD-10-CM

## 2020-08-15 DIAGNOSIS — Z79890 Hormone replacement therapy (postmenopausal): Secondary | ICD-10-CM

## 2020-08-15 DIAGNOSIS — N289 Disorder of kidney and ureter, unspecified: Secondary | ICD-10-CM

## 2020-08-15 DIAGNOSIS — Z79899 Other long term (current) drug therapy: Secondary | ICD-10-CM

## 2020-08-15 DIAGNOSIS — Z20822 Contact with and (suspected) exposure to covid-19: Secondary | ICD-10-CM | POA: Diagnosis present

## 2020-08-15 LAB — CBC
HCT: 44.6 % (ref 36.0–46.0)
Hemoglobin: 14.4 g/dL (ref 12.0–15.0)
MCH: 29.8 pg (ref 26.0–34.0)
MCHC: 32.3 g/dL (ref 30.0–36.0)
MCV: 92.1 fL (ref 80.0–100.0)
Platelets: 467 10*3/uL — ABNORMAL HIGH (ref 150–400)
RBC: 4.84 MIL/uL (ref 3.87–5.11)
RDW: 16.7 % — ABNORMAL HIGH (ref 11.5–15.5)
WBC: 8.3 10*3/uL (ref 4.0–10.5)
nRBC: 0 % (ref 0.0–0.2)

## 2020-08-15 LAB — TROPONIN I (HIGH SENSITIVITY): Troponin I (High Sensitivity): 5 ng/L (ref <18)

## 2020-08-15 LAB — BASIC METABOLIC PANEL
Anion gap: 7 (ref 5–15)
BUN: 13 mg/dL (ref 8–23)
CO2: 31 mmol/L (ref 22–32)
Calcium: 8.4 mg/dL — ABNORMAL LOW (ref 8.9–10.3)
Chloride: 101 mmol/L (ref 98–111)
Creatinine, Ser: 1.29 mg/dL — ABNORMAL HIGH (ref 0.44–1.00)
GFR, Estimated: 44 mL/min — ABNORMAL LOW (ref >60)
Glucose, Bld: 110 mg/dL — ABNORMAL HIGH (ref 70–99)
Potassium: 3.9 mmol/L (ref 3.5–5.1)
Sodium: 139 mmol/L (ref 135–145)

## 2020-08-15 LAB — RESP PANEL BY RT-PCR (FLU A&B, COVID) ARPGX2
Influenza A by PCR: NEGATIVE
Influenza B by PCR: NEGATIVE
SARS Coronavirus 2 by RT PCR: NEGATIVE

## 2020-08-15 LAB — BRAIN NATRIURETIC PEPTIDE: B Natriuretic Peptide: 168.7 pg/mL — ABNORMAL HIGH (ref 0.0–100.0)

## 2020-08-15 LAB — PROTIME-INR
INR: 2.1 — ABNORMAL HIGH (ref 0.8–1.2)
Prothrombin Time: 23.8 seconds — ABNORMAL HIGH (ref 11.4–15.2)

## 2020-08-15 MED ORDER — FUROSEMIDE 10 MG/ML IJ SOLN
80.0000 mg | Freq: Once | INTRAMUSCULAR | Status: AC
  Administered 2020-08-15: 80 mg via INTRAVENOUS
  Filled 2020-08-15: qty 8

## 2020-08-15 MED ORDER — FUROSEMIDE 10 MG/ML IJ SOLN
40.0000 mg | Freq: Once | INTRAMUSCULAR | Status: DC
  Filled 2020-08-15: qty 4

## 2020-08-15 NOTE — ED Provider Notes (Signed)
[facility] Provider Note   CSN: [id number] Arrival date & time: 08/15/20  2141     History Chief Complaint  Patient presents with   Congestive Heart Failure    [name] is a 75 y.o. female history of diastolic heart failure, CAD with stent, here presenting with shortness of breath and chest pain.  Patient has been having shortness of breath for the last several months.  Patient has worsening orthopnea for the last several days.  Patient has been monitoring her weight and her weight has been stable.  [name] states that since yesterday, [name] had worsening chest pain.  [name] states that the pain is a sense of pressure.  [name] states that it improved with nitro.  Patient follows up with Dr. Marlou Porch from cardiology.  The history is provided by the patient.      Past Medical History:  Diagnosis Date   CAD (coronary artery disease)    a. LHC 3/17: mLAD 80, D1 50, pRCA 30, EF 25-35%; PCI: 3.5 x 16 mm Synergy DES to mLAD // Myoview 08/2018: EF 54, normal perfusion; Low Risk   Chronic diastolic CHF 7/34/1937   Echocardiogram 08/2018: EF 55-60, mild septal basal hypertrophy, Gr 2 DD, elevated LVEDP, mild LAE, mild AI   Dilated cardiomyopathy >> EF returned to normal in NSR    Probable Tachycardia Mediated // a. Echo 12/16: Mild concentric LVH, EF 25-30%, anteroseptal akinesis, inferoseptal akinesis, anterior akinesis, trivial AI, mild MR, moderate LAE, trivial TR, trivial PI, PASP 33 mmHg  //  b. Echo 2/17: Mild LVH, EF 25-30%, diffuse HK, mild LAE  //  c. Echo 7/17: mild LVH, EF 50-55%, no RWMA, trivial AI, mild to mod LAE, mild RAE   Febrile seizure ([place]) 2004   "during TTP thing; cause my temp was 106"   High cholesterol    History of blood transfusion 2004   "several; related to TTP"   Hypertension    Hypothyroidism    Morbid obesity ([place]) 01/07/2013   Osteoarthritis of back    Paroxysmal atrial fibrillation ([place]) 08/01/2011   a. 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) 04/18/2015   Hypertensive heart disease with heart failure ([place]) 04/15/2015   Anxiety 04/15/2015   Encounter for therapeutic drug monitoring 01/21/2015   Dyspnea on exertion 01/05/2015   Chronic anticoagulation 05/05/2014   Primary insomnia 05/05/2014   Long-term use of high-risk medication 02/05/2014   Hypothyroidism 06/14/2013   Localized osteoporosis without current pathological fracture 06/14/2013   Low back pain 06/14/2013   Morbid obesity ([place]) 01/07/2013   Polypharmacy 01/07/2013   Benign essential HTN 08/01/2011   Atrial fibrillation ([place]) 08/01/2011   Thrombotic thrombocytopenic purpura ([place]) 05/03/2011  Past Surgical History:  Procedure Laterality Date   ABDOMINAL EXPLORATION SURGERY     "opened me up 2-3 times for blocked intestines"   APPENDECTOMY     BACK SURGERY     BASAL CELL CARCINOMA EXCISION Right    "neck"   CARDIAC CATHETERIZATION N/A 04/17/2015   Procedure: Left Heart Cath and Coronary Angiography;  Surgeon: Jettie Booze, MD;  Location: [facility];  Service: Cardiovascular;  Laterality: N/A;    CARDIAC CATHETERIZATION  04/17/2015   Procedure: Coronary Stent Intervention;  Surgeon: Jettie Booze, MD;  Location: [name];  Service: Cardiovascular;;   CARDIOVERSION  06/2002; 03/2005; 05/2005   Archie Endo 06/16/2010   CARDIOVERSION N/A 10/03/2015   Procedure: CARDIOVERSION;  Surgeon: Deboraha Sprang, MD;  Location: [facility];  Service: Cardiovascular;  Laterality: N/A;   CATARACT EXTRACTION W/ INTRAOCULAR LENS  IMPLANT, BILATERAL Bilateral    CHOLECYSTECTOMY OPEN     CORONARY ANGIOPLASTY     DILATION AND CURETTAGE OF UTERUS     S/P "miscarriage"   FOOT SURGERY Right    "for nerve damage"   SALIVARY STONE REMOVAL Left    "had tumor wrapped around it"   SPLENECTOMY, TOTAL     Archie Endo 06/16/2010   TEE WITHOUT CARDIOVERSION N/A 09/30/2015   Procedure: TRANSESOPHAGEAL ECHOCARDIOGRAM (TEE);  Surgeon: Skeet Latch, MD;  Location: [facility];  Service: Cardiovascular;  Laterality: N/A;   THORACIC DISCECTOMY  05/2002   THYROIDECTOMY, PARTIAL  X 2   "grew back"   TONSILLECTOMY     TUBAL LIGATION     TUMOR EXCISION     "benign tumor in my neck"   VAGINAL HYSTERECTOMY       OB History   No obstetric history on file.     Family History  Problem Relation Age of Onset   Heart attack Mother    Cancer Sister    Cancer Brother     Social History   Tobacco Use   Smoking status: Never   Smokeless tobacco: Never  Vaping Use   Vaping Use: Never used  Substance Use Topics   Alcohol use: No   Drug use: No    Home Medications Prior to Admission medications   Medication Sig Start Date End Date Taking? Authorizing Provider  Cholecalciferol (VITAMIN D3) 2000 units capsule Take 2,000 Units by mouth every morning.    [provider]  furosemide (LASIX) 40 MG tablet Take 40 mg by mouth daily.    [provider]  isosorbide mononitrate (IMDUR) 30 MG 24 hr tablet TAKE 1 TABLET BY MOUTH EVERY DAY 07/28/20   Jerline Pain, MD  levothyroxine (SYNTHROID, LEVOTHROID)  50 MCG tablet Take 50 mcg by mouth daily before breakfast.    [provider]  metoprolol succinate (TOPROL-XL) 25 MG 24 hr tablet TAKE 1 TABLET (25 MG TOTAL) BY MOUTH DAILY. TAKE WITH OR IMMEDIATELY FOLLOWING A MEAL. 05/01/20   Richardson Dopp T, PA-C  nitroGLYCERIN (NITROSTAT) 0.4 MG SL tablet PLACE 1 TABLET (0.4 MG TOTAL) UNDER THE TONGUE EVERY 5 (FIVE) MINUTES AS NEEDED FOR CHEST PAIN. 09/24/18   Burtis Junes, NP  potassium chloride (KLOR-CON) 10 MEQ tablet Take 10 mEq by mouth daily.    [provider]  sotalol (BETAPACE) 120 MG tablet TAKE 1 TABLET (120 MG TOTAL) BY MOUTH 2 (TWO) TIMES DAILY. 03/13/20   Jerline Pain, MD  warfarin (COUMADIN) 5 MG tablet TAKE AS DIRECTED BY ANTICOAGULATION CLINIC 07/28/20   Marlou Porch,  Thana Farr, MD    Allergies    Ceftriaxone, Penicillins, Levofloxacin, Levofloxacin, Oxycodone, Tape, Alendronate, Aspirin, Oxycodone-acetaminophen, and Penicillin g  Review of Systems   Review of Systems  Respiratory:  Positive for shortness of breath.   Cardiovascular:  Positive for chest pain.  All other systems reviewed and are negative.  Physical Exam Updated Vital Signs BP (!) 147/99 (BP Location: Right Arm)   Pulse 74   Temp 97.8 F (36.6 C) (Oral)   Resp (!) 24   Ht 5\' 4"  (1.626 m)   Wt 119.3 kg   SpO2 90%   BMI 45.13 kg/m   Physical Exam Vitals and nursing note reviewed.  Constitutional:      Comments: Tachypneic  HENT:     Head: Normocephalic.     Nose: Nose normal.     Mouth/Throat:     Mouth: Mucous membranes are dry.  Eyes:     Extraocular Movements: Extraocular movements intact.     Pupils: Pupils are equal, round, and reactive to light.  Cardiovascular:     Rate and Rhythm: Normal rate and regular rhythm.     Pulses: Normal pulses.     Heart sounds: Normal heart sounds.  Pulmonary:     Comments: Crackles bilateral bases Abdominal:     General: Abdomen is flat.     Palpations: Abdomen is soft.  Musculoskeletal:     Cervical  back: Normal range of motion and neck supple.     Comments: 1+ edema bilaterally  Skin:    General: Skin is warm.     Capillary Refill: Capillary refill takes less than 2 seconds.  Neurological:     General: No focal deficit present.     Mental Status: [name] is oriented to person, place, and time.  Psychiatric:        Mood and Affect: Mood normal.        Behavior: Behavior normal.    ED Results / Procedures / Treatments   Labs (all labs ordered are listed, but only abnormal results are displayed) Labs Reviewed  BASIC METABOLIC PANEL - Abnormal; Notable for the following components:      Result Value   Glucose, Bld 110 (*)    Creatinine, Ser 1.29 (*)    Calcium 8.4 (*)    GFR, Estimated 44 (*)    All other components within normal limits  CBC - Abnormal; Notable for the following components:   RDW 16.7 (*)    Platelets 467 (*)    All other components within normal limits  PROTIME-INR - Abnormal; Notable for the following components:   Prothrombin Time 23.8 (*)    INR 2.1 (*)    All other components within normal limits  BRAIN NATRIURETIC PEPTIDE - Abnormal; Notable for the following components:   B Natriuretic Peptide 168.7 (*)    All other components within normal limits  RESP PANEL BY RT-PCR (FLU A&B, COVID) [id number]  TROPONIN I (HIGH SENSITIVITY)    EKG EKG Interpretation  Date/Time:  Saturday August 15 2020 21:52:37 EDT Ventricular Rate:  80 PR Interval:    QRS Duration: 78 QT Interval:  382 QTC Calculation: 440 R Axis:   121 Text Interpretation: Atrial fibrillation Right axis deviation Low voltage QRS Septal infarct , age undetermined Abnormal ECG No significant change since last tracing Confirmed by Wandra Arthurs [phone]) on 08/15/2020 10:16:03 PM  Radiology DG Chest 2 View  Result Date: 08/15/2020 CLINICAL DATA:  Increasing shortness of breath for 2 months, orthopnea  EXAM: CHEST - 2 VIEW COMPARISON:  11/28/2017 FINDINGS: Frontal and lateral views of the chest  demonstrate mild enlargement the cardiac silhouette. There is increased central vascular prominence with diffuse interstitial opacities throughout the lungs. Patchy bilateral infrahilar ground-glass airspace disease identified. No effusion or pneumothorax. No acute bony abnormalities. IMPRESSION: 1. Findings consistent with mild fluid overload and interstitial edema. Electronically Signed   By: Randa Ngo M.D.   On: 08/15/2020 22:26    Procedures Procedures   Medications Ordered in ED Medications  furosemide (LASIX) injection 40 mg (has no administration in time range)    ED Course  I have reviewed the triage vital signs and the nursing notes.  Pertinent labs & imaging results that were available during my care of the patient were reviewed by me and considered in my medical decision making (see chart for details).    MDM Rules/Calculators/A&P                         ZEENA STARKEL is a 75 y.o. female here presenting with shortness of breath and chest pain.  Shortness of breath for weeks and orthopnea for several days and chest pain since yesterday.  Patient has CAD with stents and diastolic heart failure.  High risk for ACS.  Plan to get CBC and CMP and troponin and BNP.  Likely will need diuresis and admission for rule out ACS  10:59 PM BNP 168. O2 is 90%, placed on 2 L [place].  Ordered Lasix 80 mg IV.  Initial troponin is normal.  Will admit for rule out ACS and heart failure exacerbation   Final Clinical Impression(s) / ED Diagnoses Final diagnoses:  None    Rx / DC Orders ED Discharge Orders     None        Drenda Freeze, MD 08/15/20 2313

## 2020-08-15 NOTE — ED Notes (Signed)
Patient transported to X-ray 

## 2020-08-15 NOTE — ED Triage Notes (Signed)
Pt ha increasing ShOB x 2 months with hx of CHF. Pt reports associated orthopnea. Pt states she broke out into a sweat and had CP 2 nights ago.

## 2020-08-16 ENCOUNTER — Encounter (HOSPITAL_COMMUNITY): Payer: Self-pay | Admitting: Family Medicine

## 2020-08-16 DIAGNOSIS — Z880 Allergy status to penicillin: Secondary | ICD-10-CM | POA: Diagnosis not present

## 2020-08-16 DIAGNOSIS — I4819 Other persistent atrial fibrillation: Secondary | ICD-10-CM | POA: Diagnosis present

## 2020-08-16 DIAGNOSIS — E876 Hypokalemia: Secondary | ICD-10-CM | POA: Diagnosis not present

## 2020-08-16 DIAGNOSIS — I130 Hypertensive heart and chronic kidney disease with heart failure and stage 1 through stage 4 chronic kidney disease, or unspecified chronic kidney disease: Secondary | ICD-10-CM | POA: Diagnosis present

## 2020-08-16 DIAGNOSIS — E7800 Pure hypercholesterolemia, unspecified: Secondary | ICD-10-CM | POA: Diagnosis present

## 2020-08-16 DIAGNOSIS — Z881 Allergy status to other antibiotic agents status: Secondary | ICD-10-CM | POA: Diagnosis not present

## 2020-08-16 DIAGNOSIS — Z7901 Long term (current) use of anticoagulants: Secondary | ICD-10-CM | POA: Diagnosis not present

## 2020-08-16 DIAGNOSIS — Z85828 Personal history of other malignant neoplasm of skin: Secondary | ICD-10-CM | POA: Diagnosis not present

## 2020-08-16 DIAGNOSIS — Z79890 Hormone replacement therapy (postmenopausal): Secondary | ICD-10-CM | POA: Diagnosis not present

## 2020-08-16 DIAGNOSIS — I2510 Atherosclerotic heart disease of native coronary artery without angina pectoris: Secondary | ICD-10-CM | POA: Diagnosis present

## 2020-08-16 DIAGNOSIS — Z809 Family history of malignant neoplasm, unspecified: Secondary | ICD-10-CM | POA: Diagnosis not present

## 2020-08-16 DIAGNOSIS — N289 Disorder of kidney and ureter, unspecified: Secondary | ICD-10-CM

## 2020-08-16 DIAGNOSIS — Z888 Allergy status to other drugs, medicaments and biological substances status: Secondary | ICD-10-CM | POA: Diagnosis not present

## 2020-08-16 DIAGNOSIS — Z885 Allergy status to narcotic agent status: Secondary | ICD-10-CM | POA: Diagnosis not present

## 2020-08-16 DIAGNOSIS — N1831 Chronic kidney disease, stage 3a: Secondary | ICD-10-CM | POA: Diagnosis present

## 2020-08-16 DIAGNOSIS — E669 Obesity, unspecified: Secondary | ICD-10-CM | POA: Diagnosis present

## 2020-08-16 DIAGNOSIS — Z6841 Body Mass Index (BMI) 40.0 and over, adult: Secondary | ICD-10-CM | POA: Diagnosis not present

## 2020-08-16 DIAGNOSIS — Z8249 Family history of ischemic heart disease and other diseases of the circulatory system: Secondary | ICD-10-CM | POA: Diagnosis not present

## 2020-08-16 DIAGNOSIS — I5043 Acute on chronic combined systolic (congestive) and diastolic (congestive) heart failure: Secondary | ICD-10-CM | POA: Diagnosis present

## 2020-08-16 DIAGNOSIS — I5033 Acute on chronic diastolic (congestive) heart failure: Secondary | ICD-10-CM | POA: Diagnosis not present

## 2020-08-16 DIAGNOSIS — I420 Dilated cardiomyopathy: Secondary | ICD-10-CM | POA: Diagnosis present

## 2020-08-16 DIAGNOSIS — Z955 Presence of coronary angioplasty implant and graft: Secondary | ICD-10-CM | POA: Diagnosis not present

## 2020-08-16 DIAGNOSIS — E039 Hypothyroidism, unspecified: Secondary | ICD-10-CM | POA: Diagnosis present

## 2020-08-16 DIAGNOSIS — Z886 Allergy status to analgesic agent status: Secondary | ICD-10-CM | POA: Diagnosis not present

## 2020-08-16 DIAGNOSIS — Z79899 Other long term (current) drug therapy: Secondary | ICD-10-CM | POA: Diagnosis not present

## 2020-08-16 DIAGNOSIS — Z20822 Contact with and (suspected) exposure to covid-19: Secondary | ICD-10-CM | POA: Diagnosis present

## 2020-08-16 DIAGNOSIS — I509 Heart failure, unspecified: Secondary | ICD-10-CM | POA: Diagnosis present

## 2020-08-16 LAB — CBC
HCT: 44.3 % (ref 36.0–46.0)
Hemoglobin: 14.7 g/dL (ref 12.0–15.0)
MCH: 30.2 pg (ref 26.0–34.0)
MCHC: 33.2 g/dL (ref 30.0–36.0)
MCV: 91.2 fL (ref 80.0–100.0)
Platelets: 439 10*3/uL — ABNORMAL HIGH (ref 150–400)
RBC: 4.86 MIL/uL (ref 3.87–5.11)
RDW: 16.3 % — ABNORMAL HIGH (ref 11.5–15.5)
WBC: 9.3 10*3/uL (ref 4.0–10.5)
nRBC: 0 % (ref 0.0–0.2)

## 2020-08-16 LAB — BASIC METABOLIC PANEL
Anion gap: 11 (ref 5–15)
BUN: 11 mg/dL (ref 8–23)
CO2: 29 mmol/L (ref 22–32)
Calcium: 8.5 mg/dL — ABNORMAL LOW (ref 8.9–10.3)
Chloride: 99 mmol/L (ref 98–111)
Creatinine, Ser: 0.99 mg/dL (ref 0.44–1.00)
GFR, Estimated: 60 mL/min — ABNORMAL LOW (ref >60)
Glucose, Bld: 106 mg/dL — ABNORMAL HIGH (ref 70–99)
Potassium: 3.4 mmol/L — ABNORMAL LOW (ref 3.5–5.1)
Sodium: 139 mmol/L (ref 135–145)

## 2020-08-16 LAB — TROPONIN I (HIGH SENSITIVITY): Troponin I (High Sensitivity): 5 ng/L (ref <18)

## 2020-08-16 MED ORDER — LEVOTHYROXINE SODIUM 50 MCG PO TABS
50.0000 ug | ORAL_TABLET | Freq: Every day | ORAL | Status: DC
  Administered 2020-08-16 – 2020-08-20 (×5): 50 ug via ORAL
  Filled 2020-08-16 (×5): qty 1

## 2020-08-16 MED ORDER — WARFARIN SODIUM 5 MG PO TABS
5.0000 mg | ORAL_TABLET | ORAL | Status: DC
  Administered 2020-08-16 – 2020-08-19 (×3): 5 mg via ORAL
  Filled 2020-08-16 (×3): qty 1

## 2020-08-16 MED ORDER — ONDANSETRON HCL 4 MG/2ML IJ SOLN: 4.0000 mg | Freq: Four times a day (QID) | INTRAMUSCULAR | Status: DC | PRN

## 2020-08-16 MED ORDER — WARFARIN - PHARMACIST DOSING INPATIENT: Freq: Every day | Status: DC

## 2020-08-16 MED ORDER — ISOSORBIDE MONONITRATE ER 30 MG PO TB24
30.0000 mg | ORAL_TABLET | Freq: Every day | ORAL | Status: DC
  Administered 2020-08-16 – 2020-08-20 (×5): 30 mg via ORAL
  Filled 2020-08-16 (×5): qty 1

## 2020-08-16 MED ORDER — FUROSEMIDE 10 MG/ML IJ SOLN
40.0000 mg | Freq: Two times a day (BID) | INTRAMUSCULAR | Status: DC
  Administered 2020-08-16 – 2020-08-18 (×5): 40 mg via INTRAVENOUS
  Filled 2020-08-16 (×5): qty 4

## 2020-08-16 MED ORDER — POTASSIUM CHLORIDE CRYS ER 20 MEQ PO TBCR
40.0000 meq | EXTENDED_RELEASE_TABLET | Freq: Once | ORAL | Status: AC
  Administered 2020-08-16: 40 meq via ORAL
  Filled 2020-08-16: qty 2

## 2020-08-16 MED ORDER — SOTALOL HCL 120 MG PO TABS
120.0000 mg | ORAL_TABLET | Freq: Two times a day (BID) | ORAL | Status: DC
  Administered 2020-08-16 – 2020-08-20 (×9): 120 mg via ORAL
  Filled 2020-08-16 (×10): qty 1

## 2020-08-16 MED ORDER — HYDRALAZINE HCL 25 MG PO TABS: 25.0000 mg | ORAL_TABLET | Freq: Four times a day (QID) | ORAL | Status: DC | PRN

## 2020-08-16 MED ORDER — SODIUM CHLORIDE 0.9 % IV SOLN: 250.0000 mL | INTRAVENOUS | Status: DC | PRN

## 2020-08-16 MED ORDER — WARFARIN SODIUM 7.5 MG PO TABS
7.5000 mg | ORAL_TABLET | ORAL | Status: DC
  Administered 2020-08-18: 7.5 mg via ORAL
  Filled 2020-08-16: qty 1

## 2020-08-16 MED ORDER — SODIUM CHLORIDE 0.9% FLUSH: 3.0000 mL | INTRAVENOUS | Status: DC | PRN

## 2020-08-16 MED ORDER — ACETAMINOPHEN 325 MG PO TABS
650.0000 mg | ORAL_TABLET | ORAL | Status: DC | PRN
  Filled 2020-08-16: qty 2

## 2020-08-16 MED ORDER — SODIUM CHLORIDE 0.9% FLUSH
3.0000 mL | Freq: Two times a day (BID) | INTRAVENOUS | Status: DC
  Administered 2020-08-16 – 2020-08-19 (×9): 3 mL via INTRAVENOUS

## 2020-08-16 MED ORDER — METOPROLOL SUCCINATE ER 25 MG PO TB24
25.0000 mg | ORAL_TABLET | Freq: Every day | ORAL | Status: DC
  Administered 2020-08-16 – 2020-08-20 (×5): 25 mg via ORAL
  Filled 2020-08-16 (×5): qty 1

## 2020-08-16 NOTE — Progress Notes (Signed)
PROGRESS NOTE    [name]  AOZ:[id number] DOB: [birth year] DOA: 08/15/2020 PCP: Elisabeth Cara, PA-C     Brief Narrative:  [name] is a 75 y.o. female with medical history significant for coronary artery disease, hypothyroidism, atrial fibrillation on warfarin, and BMI 46, now presenting to the emergency department for evaluation of shortness of breath, orthopnea, and chest discomfort.  Patient reports that she has been dealing with increased shortness of breath for approximately 2 months but then went on to develop orthopnea over the past 3 or 4 days and had an episode of chest discomfort while trying to rest in a recliner 2 nights ago.  She has not noticed any increased leg swelling, denies significant cough, and denies fever or chills.  She has had to sleep in a recliner due to orthopnea recently.  Her chest pain episode resolved after approximately 10 minutes, was localized to the central chest, and while she took 1 dose of nitroglycerin, she is not sure that that is what led to resolution. Chest x-ray showed increased vascular congestion and interstitial edema. Patient was given 80 mg IV Lasix in the ED and transferred to [facility] for ongoing evaluation and management.  New events last 24 hours / Subjective: Patient sitting in a recliner, remains on room air.  She states that her breathing has improved, lower extremity edema has improved as well.  Wants to revoke her DNR and remain full code in case of sudden decompensation.  Patient with urine output 3.6 L last 24 hours  Assessment & Plan:   Principal Problem:   Acute on chronic diastolic CHF (congestive heart failure) (HCC) Active Problems:   Persistent atrial fibrillation (HCC)   CAD (coronary artery disease)   Hypothyroidism   Renal insufficiency   Acute on chronic diastolic heart failure -BNP 168.7, with findings consistent with mild fluid overload, interstitial edema -Continue IV  Lasix -Strict I's and O's, daily weight, fluid restriction diet -Echocardiogram pending  Coronary artery disease -Troponin remains negative 5 --> 5 -Chest pain resolved -Continue Imdur, metoprolol  Chronic A. fib -Continue Coumadin, sotalol, Toprol  Hypothyroidism -Continue Synthroid  Hypokalemia -Replace, trend   DVT prophylaxis:   warfarin (COUMADIN) tablet 5 mg  warfarin (COUMADIN) tablet 7.5 mg  Code Status: Full code, discussed with patient during rounds this morning  Family Communication: No family at bedside Disposition Plan:  Status is: Inpatient  Remains inpatient appropriate because:IV treatments appropriate due to intensity of illness or inability to take PO and Inpatient level of care appropriate due to severity of illness  Dispo: The patient is from: Home              Anticipated d/c is to: Home              Patient currently is not medically stable to d/c.   Difficult to place patient No      Antimicrobials:  Anti-infectives (From admission, onward)    None        Objective: Vitals:   08/16/20 0140 08/16/20 0436 08/16/20 0700 08/16/20 1224  BP: (!) 189/114 (!) 143/92 137/83 121/75  Pulse: 92 86 88 78  Resp: (!) 28 (!) 25 20 20   Temp: 98.2 F (36.8 C) (!) 97.3 F (36.3 C) 97.6 F (36.4 C) (!) 97.4 F (36.3 C)  TempSrc: Oral Oral Oral Oral  SpO2: 96% 93% 92% 95%  Weight: 121.9 kg     Height: 5\' 4"  (1.626 m)  Intake/Output Summary (Last 24 hours) at 08/16/2020 1246 Last data filed at 08/16/2020 0600 Gross per 24 hour  Intake 480 ml  Output 3650 ml  Net -3170 ml   Filed Weights   08/15/20 2148 08/16/20 0140  Weight: 119.3 kg 121.9 kg    Examination:  General exam: Appears calm and comfortable  Respiratory system: Clear to auscultation. Respiratory effort normal. No respiratory distress. No conversational dyspnea.  On room air Cardiovascular system: S1 & S2 heard, irregular rhythm, rate 70s. No murmurs.  Bilateral nonpitting  pedal edema. Gastrointestinal system: Abdomen is nondistended, soft and nontender. Normal bowel sounds heard. Central nervous system: Alert and oriented. No focal neurological deficits. Speech clear.  Extremities: Symmetric in appearance  Skin: No rashes, lesions or ulcers on exposed skin  Psychiatry: Judgement and insight appear normal. Mood & affect appropriate.   Data Reviewed: I have personally reviewed following labs and imaging studies  CBC: Recent Labs  Lab 08/15/20 2209 08/16/20 0630  WBC 8.3 9.3  HGB 14.4 14.7  HCT 44.6 44.3  MCV 92.1 91.2  PLT 467* 854*   Basic Metabolic Panel: Recent Labs  Lab 08/15/20 2209 08/16/20 0630  NA 139 139  K 3.9 3.4*  CL 101 99  CO2 31 29  GLUCOSE 110* 106*  BUN 13 11  CREATININE 1.29* 0.99  CALCIUM 8.4* 8.5*   GFR: Estimated Creatinine Clearance: 64.2 mL/min (by C-G formula based on SCr of 0.99 mg/dL). Liver Function Tests: No results for input(s): AST, ALT, ALKPHOS, BILITOT, PROT, ALBUMIN in the last 168 hours. No results for input(s): LIPASE, AMYLASE in the last 168 hours. No results for input(s): AMMONIA in the last 168 hours. Coagulation Profile: Recent Labs  Lab 08/15/20 2209  INR 2.1*   Cardiac Enzymes: No results for input(s): CKTOTAL, CKMB, CKMBINDEX, TROPONINI in the last 168 hours. BNP (last 3 results) No results for input(s): PROBNP in the last 8760 hours. HbA1C: No results for input(s): HGBA1C in the last 72 hours. CBG: No results for input(s): GLUCAP in the last 168 hours. Lipid Profile: No results for input(s): CHOL, HDL, LDLCALC, TRIG, CHOLHDL, LDLDIRECT in the last 72 hours. Thyroid Function Tests: No results for input(s): TSH, T4TOTAL, FREET4, T3FREE, THYROIDAB in the last 72 hours. Anemia Panel: No results for input(s): VITAMINB12, FOLATE, FERRITIN, TIBC, IRON, RETICCTPCT in the last 72 hours. Sepsis Labs: No results for input(s): PROCALCITON, LATICACIDVEN in the last 168 hours.  Recent Results  (from the past 240 hour(s))  Resp Panel by RT-PCR (Flu A&B, Covid) Nasopharyngeal Swab     Status: None   Collection Time: 08/15/20 10:48 PM   Specimen: Nasopharyngeal Swab; Nasopharyngeal(NP) swabs in vial transport medium  Result Value Ref Range Status   SARS Coronavirus 2 by RT PCR NEGATIVE NEGATIVE Final    Comment: (NOTE) SARS-CoV-2 target nucleic acids are NOT DETECTED.  The SARS-CoV-2 RNA is generally detectable in upper respiratory specimens during the acute phase of infection. The lowest concentration of SARS-CoV-2 viral copies this assay can detect is 138 copies/mL. A negative result does not preclude SARS-Cov-2 infection and should not be used as the sole basis for treatment or other patient management decisions. A negative result may occur with  improper specimen collection/handling, submission of specimen other than nasopharyngeal swab, presence of viral mutation(s) within the areas targeted by this assay, and inadequate number of viral copies(<138 copies/mL). A negative result must be combined with clinical observations, patient history, and epidemiological information. The expected result is Negative.  Fact Sheet  for Patients:  EntrepreneurPulse.com.au  Fact Sheet for Healthcare Providers:  IncredibleEmployment.be  This test is no t yet approved or cleared by the [place] FDA and  has been authorized for detection and/or diagnosis of SARS-CoV-2 by FDA under an Emergency Use Authorization (EUA). This EUA will remain  in effect (meaning this test can be used) for the duration of the COVID-19 declaration under Section 564(b)(1) of the Act, 21 U.S.C.section 360bbb-3(b)(1), unless the authorization is terminated  or revoked sooner.       Influenza A by PCR NEGATIVE NEGATIVE Final   Influenza B by PCR NEGATIVE NEGATIVE Final    Comment: (NOTE) The Xpert Xpress SARS-CoV-2/FLU/RSV plus assay is intended as an aid in the  diagnosis of influenza from Nasopharyngeal swab specimens and should not be used as a sole basis for treatment. Nasal washings and aspirates are unacceptable for Xpert Xpress SARS-CoV-2/FLU/RSV testing.  Fact Sheet for Patients: EntrepreneurPulse.com.au  Fact Sheet for Healthcare Providers: IncredibleEmployment.be  This test is not yet approved or cleared by the [place] FDA and has been authorized for detection and/or diagnosis of SARS-CoV-2 by FDA under an Emergency Use Authorization (EUA). This EUA will remain in effect (meaning this test can be used) for the duration of the COVID-19 declaration under Section 564(b)(1) of the Act, 21 U.S.C. section 360bbb-3(b)(1), unless the authorization is terminated or revoked.  Performed at [facility] [address] [place], [place] [address]       Radiology Studies: DG Chest 2 View  Result Date: 08/15/2020 CLINICAL DATA:  Increasing shortness of breath for 2 months, orthopnea EXAM: CHEST - 2 VIEW COMPARISON:  11/28/2017 FINDINGS: Frontal and lateral views of the chest demonstrate mild enlargement the cardiac silhouette. There is increased central vascular prominence with diffuse interstitial opacities throughout the lungs. Patchy bilateral infrahilar ground-glass airspace disease identified. No effusion or pneumothorax. No acute bony abnormalities. IMPRESSION: 1. Findings consistent with mild fluid overload and interstitial edema. Electronically Signed   By: Randa Ngo M.D.   On: 08/15/2020 22:26      Scheduled Meds:  furosemide  40 mg Intravenous BID   isosorbide mononitrate  30 mg Oral Daily   levothyroxine  50 mcg Oral QAC breakfast   metoprolol succinate  25 mg Oral Daily   sodium chloride flush  3 mL Intravenous Q12H   sotalol  120 mg Oral BID   warfarin  5 mg Oral Once per day on Sun Mon Wed Thu Sat   [START ON 08/18/2020] warfarin  7.5 mg Oral Once per day on Tue Fri    Warfarin - Pharmacist Dosing Inpatient   Does not apply q1600   Continuous Infusions:  sodium chloride       LOS: 0 days      Time spent: 25 minutes   Dessa Phi, DO [facility] 08/16/2020, 12:46 PM   Available via Epic secure chat 7am-7pm After these hours, please refer to coverage provider listed on [email or web address]

## 2020-08-16 NOTE — Progress Notes (Signed)
ANTICOAGULATION CONSULT NOTE - Initial Consult  Pharmacy Consult for Warfarin  Indication: atrial fibrillation  Allergies  Allergen Reactions   Ceftriaxone Rash and Other (See Comments)    "TTP"/Was hospitalized and in a coma for 60 days, per patient (Thrombotic Thrombocytopenic Purpura)   Penicillins Hives, Rash and Other (See Comments)    Cannot tolerate any "-CILLINS"; causes welts!! Has patient had a PCN reaction causing immediate rash, facial/tongue/throat swelling, SOB or lightheadedness with hypotension: Yes Has patient had a PCN reaction causing severe rash involving mucus membranes or skin necrosis: No Has patient had a PCN reaction that required hospitalization No Has patient had a PCN reaction occurring within the last 10 years: No If all of the above answers are "NO", then may proceed with Cephalosporin use.   Levofloxacin Other (See Comments)    Thrombocytopenia     Levofloxacin Other (See Comments)    Dropped blood platelets extremely low   Oxycodone Itching   Tape Other (See Comments)    Prefers paper or cloth tape Other reaction(s): Other (See Comments) Prefers paper or cloth tape Other reaction(s): Other (See Comments) Prefers paper or cloth tape   Alendronate Rash   Aspirin Other (See Comments)    Patient stated she is unable to take due to blood thinner    Oxycodone-Acetaminophen Itching   Penicillin G Rash    Patient Measurements: Height: 5\' 4"  (162.6 cm) Weight: 121.9 kg (268 lb 11.9 oz) IBW/kg (Calculated) : 54.7   Vital Signs: Temp: 97.3 F (36.3 C) (07/17 0436) Temp Source: Oral (07/17 0436) BP: 143/92 (07/17 0436) Pulse Rate: 86 (07/17 0436)  Labs: Recent Labs    08/15/20 2209 08/16/20 0044  HGB 14.4  --   HCT 44.6  --   PLT 467*  --   LABPROT 23.8*  --   INR 2.1*  --   CREATININE 1.29*  --   TROPONINIHS 5 5    Estimated Creatinine Clearance: 49.3 mL/min (A) (by C-G formula based on SCr of 1.29 mg/dL (H)).   Medical  History: Past Medical History:  Diagnosis Date   CAD (coronary artery disease)    a. LHC 3/17: mLAD 80, D1 50, pRCA 30, EF 25-35%; PCI: 3.5 x 16 mm Synergy DES to mLAD // Myoview 08/2018: EF 54, normal perfusion; Low Risk   Chronic diastolic CHF 10/15/7827   Echocardiogram 08/2018: EF 55-60, mild septal basal hypertrophy, Gr 2 DD, elevated LVEDP, mild LAE, mild AI   Dilated cardiomyopathy >> EF returned to normal in NSR    Probable Tachycardia Mediated // a. Echo 12/16: Mild concentric LVH, EF 25-30%, anteroseptal akinesis, inferoseptal akinesis, anterior akinesis, trivial AI, mild MR, moderate LAE, trivial TR, trivial PI, PASP 33 mmHg  //  b. Echo 2/17: Mild LVH, EF 25-30%, diffuse HK, mild LAE  //  c. Echo 7/17: mild LVH, EF 50-55%, no RWMA, trivial AI, mild to mod LAE, mild RAE   Febrile seizure ([place]) 2004   "during TTP thing; cause my temp was 106"   High cholesterol    History of blood transfusion 2004   "several; related to TTP"   Hypertension    Hypothyroidism    Morbid obesity ([place]) 01/07/2013   Osteoarthritis of back    Paroxysmal atrial fibrillation ([place]) 08/01/2011   a. 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 On warfarin PTA for afib. INR is therapeutic at 2.1. CBC good.   Warfarin PTA dosing: 7.5 mg Tues/Fri, 5 mg all other days  Goal of Therapy:  INR 2-3 Monitor platelets by anticoagulation protocol: Yes   Plan:  Warfarin 7.5 mg Tues/Friday, 5 mg all other days Daily PT/INR Monitor for bleeding  Narda Bonds, PharmD, BCPS Clinical Pharmacist Phone: [phone]

## 2020-08-16 NOTE — H&P (Signed)
History and Physical    [name]:[id number] DOB: 04-Oct-1945 DOA: 08/15/2020  PCP: Elisabeth Cara, PA-C   Patient coming from: Home   Chief Complaint: SOB, orthopnea, chest discomfort   HPI: [name] is a 75 y.o. female with medical history significant for coronary artery disease, hypothyroidism, atrial fibrillation on warfarin, and BMI 46, now presenting to the emergency department for evaluation of shortness of breath, orthopnea, and chest discomfort.  Patient reports that she has been dealing with increased shortness of breath for approximately 2 months but then went on to develop orthopnea over the past 3 or 4 days and had an episode of chest discomfort while trying to rest in a recliner 2 nights ago.  She has not noticed any increased leg swelling, denies significant cough, and denies fever or chills.  She has had to sleep in a recliner due to orthopnea recently.  Her chest pain episode resolved after approximately 10 minutes, was localized to the central chest, and while she took 1 dose of nitroglycerin, she is not sure that that is what led to resolution.  [facility] ED Course: Upon arrival to the ED, patient is found to be afebrile, saturating low 90s on room air, mildly tachypneic, and with stable blood pressure.  EKG features atrial fibrillation.  Chest x-ray with increased vascular congestion and interstitial edema.  Chemistry panel notable for creatinine 1.29.  CBC with mild thrombocytosis.  INR is 2.1.  HS troponin is 5 twice.  BNP 869.  Patient was given 80 mg IV Lasix in the ED and transferred to [facility] for ongoing evaluation and management.  Review of Systems:  All other systems reviewed and apart from HPI, are negative.  Past Medical History:  Diagnosis Date   CAD (coronary artery disease)    a. LHC 3/17: mLAD 80, D1 50, pRCA 30, EF 25-35%; PCI: 3.5 x 16 mm Synergy DES to mLAD // Myoview 08/2018: EF 54, normal perfusion; Low Risk   Chronic  diastolic CHF 3/76/2831   Echocardiogram 08/2018: EF 55-60, mild septal basal hypertrophy, Gr 2 DD, elevated LVEDP, mild LAE, mild AI   Dilated cardiomyopathy >> EF returned to normal in NSR    Probable Tachycardia Mediated // a. Echo 12/16: Mild concentric LVH, EF 25-30%, anteroseptal akinesis, inferoseptal akinesis, anterior akinesis, trivial AI, mild MR, moderate LAE, trivial TR, trivial PI, PASP 33 mmHg  //  b. Echo 2/17: Mild LVH, EF 25-30%, diffuse HK, mild LAE  //  c. Echo 7/17: mild LVH, EF 50-55%, no RWMA, trivial AI, mild to mod LAE, mild RAE   Febrile seizure ([place]) 2004   "during TTP thing; cause my temp was 106"   High cholesterol    History of blood transfusion 2004   "several; related to TTP"   Hypertension    Hypothyroidism    Morbid obesity ([name]) 01/07/2013   Osteoarthritis of back    Paroxysmal atrial fibrillation ([name]) 08/01/2011   a. 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    Past Surgical History:  Procedure Laterality Date   ABDOMINAL EXPLORATION SURGERY     "opened me up 2-3 times for blocked intestines"   APPENDECTOMY     BACK SURGERY     BASAL CELL CARCINOMA EXCISION Right    "neck"   CARDIAC  CATHETERIZATION N/A 04/17/2015   Procedure: Left Heart Cath and Coronary Angiography;  Surgeon: Jettie Booze, MD;  Location: [place] [facility];  Service: Cardiovascular;  Laterality: N/A;   CARDIAC CATHETERIZATION  04/17/2015   Procedure: Coronary Stent Intervention;  Surgeon: Jettie Booze, MD;  Location: [place] [facility];  Service: Cardiovascular;;   CARDIOVERSION  06/2002; 03/2005; 05/2005   Archie Endo 06/16/2010   CARDIOVERSION N/A 10/03/2015   Procedure: CARDIOVERSION;  Surgeon: Deboraha Sprang, MD;  Location: [place];  Service: Cardiovascular;  Laterality: N/A;   CATARACT  EXTRACTION W/ INTRAOCULAR LENS  IMPLANT, BILATERAL Bilateral    CHOLECYSTECTOMY OPEN     CORONARY ANGIOPLASTY     DILATION AND CURETTAGE OF UTERUS     S/P "miscarriage"   FOOT SURGERY Right    "for nerve damage"   SALIVARY STONE REMOVAL Left    "had tumor wrapped around it"   SPLENECTOMY, TOTAL     Archie Endo 06/16/2010   TEE WITHOUT CARDIOVERSION N/A 09/30/2015   Procedure: TRANSESOPHAGEAL ECHOCARDIOGRAM (TEE);  Surgeon: Skeet Latch, MD;  Location: [place];  Service: Cardiovascular;  Laterality: N/A;   THORACIC DISCECTOMY  05/2002   THYROIDECTOMY, PARTIAL  X 2   "grew back"   TONSILLECTOMY     TUBAL LIGATION     TUMOR EXCISION     "benign tumor in my neck"   VAGINAL HYSTERECTOMY      Social History:   reports that she has never smoked. She has never used smokeless tobacco. She reports that she does not drink alcohol and does not use drugs.  Allergies  Allergen Reactions   Ceftriaxone Rash and Other (See Comments)    "TTP"/Was hospitalized and in a coma for 60 days, per patient (Thrombotic Thrombocytopenic Purpura)   Penicillins Hives, Rash and Other (See Comments)    Cannot tolerate any "-CILLINS"; causes welts!! Has patient had a PCN reaction causing immediate rash, facial/tongue/throat swelling, SOB or lightheadedness with hypotension: Yes Has patient had a PCN reaction causing severe rash involving mucus membranes or skin necrosis: No Has patient had a PCN reaction that required hospitalization No Has patient had a PCN reaction occurring within the last 10 years: No If all of the above answers are "NO", then may proceed with Cephalosporin use.   Levofloxacin Other (See Comments)    Thrombocytopenia     Levofloxacin Other (See Comments)    Dropped blood platelets extremely low   Oxycodone Itching   Tape Other (See Comments)    Prefers paper or cloth tape Other reaction(s): Other (See Comments) Prefers paper or cloth tape Other reaction(s): Other (See  Comments) Prefers paper or cloth tape   Alendronate Rash   Aspirin Other (See Comments)    Patient stated she is unable to take due to blood thinner    Oxycodone-Acetaminophen Itching   Penicillin G Rash    Family History  Problem Relation Age of Onset   Heart attack Mother    Cancer Sister    Cancer Brother      Prior to Admission medications   Medication Sig Start Date End Date Taking? Authorizing Provider  Cholecalciferol (VITAMIN D3) 2000 units capsule Take 2,000 Units by mouth every morning.    [provider]  furosemide (LASIX) 40 MG tablet Take 40 mg by mouth daily.    [provider]  isosorbide mononitrate (IMDUR) 30 MG 24 hr tablet TAKE 1 TABLET BY MOUTH EVERY DAY 07/28/20   Jerline Pain, MD  levothyroxine (SYNTHROID,  LEVOTHROID) 50 MCG tablet Take 50 mcg by mouth daily before breakfast.    [provider]  metoprolol succinate (TOPROL-XL) 25 MG 24 hr tablet TAKE 1 TABLET (25 MG TOTAL) BY MOUTH DAILY. TAKE WITH OR IMMEDIATELY FOLLOWING A MEAL. 05/01/20   Richardson Dopp T, PA-C  nitroGLYCERIN (NITROSTAT) 0.4 MG SL tablet PLACE 1 TABLET (0.4 MG TOTAL) UNDER THE TONGUE EVERY 5 (FIVE) MINUTES AS NEEDED FOR CHEST PAIN. 09/24/18   Burtis Junes, NP  potassium chloride (KLOR-CON) 10 MEQ tablet Take 10 mEq by mouth daily.    [provider]  sotalol (BETAPACE) 120 MG tablet TAKE 1 TABLET (120 MG TOTAL) BY MOUTH 2 (TWO) TIMES DAILY. 03/13/20   Jerline Pain, MD  warfarin (COUMADIN) 5 MG tablet TAKE AS DIRECTED BY ANTICOAGULATION CLINIC 07/28/20   Jerline Pain, MD    Physical Exam: Vitals:   08/16/20 0000 08/16/20 0030 08/16/20 0140 08/16/20 0436  BP: 136/65 138/75 (!) 189/114 (!) 143/92  Pulse: 85 83 92 86  Resp: (!) 23 (!) 26 (!) 28 (!) 25  Temp:   98.2 F (36.8 C) (!) 97.3 F (36.3 C)  TempSrc:   Oral Oral  SpO2: 93% 91% 96% 93%  Weight:   121.9 kg   Height:   5\' 4"  (1.626 m)     Constitutional: NAD, calm  Eyes: PERTLA, lids and  conjunctivae normal ENMT: Mucous membranes are moist. Posterior pharynx clear of any exudate or lesions.   Neck: supple, no masses  Respiratory: Mild tachypnea at rest, dyspneic with speech. No cyanosis.  Cardiovascular: S1 & S2 heard, regular rate and rhythm. Mild pitting edema to b/l LEs.   Abdomen: No distension, no tenderness, soft. Bowel sounds active.  Musculoskeletal: no clubbing / cyanosis. No joint deformity upper and lower extremities.   Skin: no significant rashes, lesions, ulcers. Warm, dry, well-perfused. Neurologic: CN 2-12 grossly intact. Sensation intact. Moving all extremities.  Psychiatric: Alert and oriented to person, place, and situation. Pleasant and cooperative.    Labs and Imaging on Admission: I have personally reviewed following labs and imaging studies  CBC: Recent Labs  Lab 08/15/20 2209  WBC 8.3  HGB 14.4  HCT 44.6  MCV 92.1  PLT 161*   Basic Metabolic Panel: Recent Labs  Lab 08/15/20 2209  NA 139  K 3.9  CL 101  CO2 31  GLUCOSE 110*  BUN 13  CREATININE 1.29*  CALCIUM 8.4*   GFR: Estimated Creatinine Clearance: 49.3 mL/min (A) (by C-G formula based on SCr of 1.29 mg/dL (H)). Liver Function Tests: No results for input(s): AST, ALT, ALKPHOS, BILITOT, PROT, ALBUMIN in the last 168 hours. No results for input(s): LIPASE, AMYLASE in the last 168 hours. No results for input(s): AMMONIA in the last 168 hours. Coagulation Profile: Recent Labs  Lab 08/15/20 2209  INR 2.1*   Cardiac Enzymes: No results for input(s): CKTOTAL, CKMB, CKMBINDEX, TROPONINI in the last 168 hours. BNP (last 3 results) No results for input(s): PROBNP in the last 8760 hours. HbA1C: No results for input(s): HGBA1C in the last 72 hours. CBG: No results for input(s): GLUCAP in the last 168 hours. Lipid Profile: No results for input(s): CHOL, HDL, LDLCALC, TRIG, CHOLHDL, LDLDIRECT in the last 72 hours. Thyroid Function Tests: No results for input(s): TSH, T4TOTAL,  FREET4, T3FREE, THYROIDAB in the last 72 hours. Anemia Panel: No results for input(s): VITAMINB12, FOLATE, FERRITIN, TIBC, IRON, RETICCTPCT in the last 72 hours. Urine analysis:    Component Value  Date/Time   COLORURINE YELLOW 07/05/2017 2222   APPEARANCEUR CLEAR 07/05/2017 2222   LABSPEC 1.013 07/05/2017 2222   PHURINE 5.0 07/05/2017 2222   GLUCOSEU NEGATIVE 07/05/2017 2222   HGBUR NEGATIVE 07/05/2017 2222   BILIRUBINUR NEGATIVE 07/05/2017 2222   KETONESUR 5 (A) 07/05/2017 2222   PROTEINUR NEGATIVE 07/05/2017 2222   UROBILINOGEN 0.2 10/17/2008 0024   NITRITE NEGATIVE 07/05/2017 2222   LEUKOCYTESUR MODERATE (A) 07/05/2017 2222   Sepsis Labs: @LABRCNTIP (procalcitonin:4,lacticidven:4) ) Recent Results (from the past 240 hour(s))  Resp Panel by RT-PCR (Flu A&B, Covid) Nasopharyngeal Swab     Status: None   Collection Time: 08/15/20 10:48 PM   Specimen: Nasopharyngeal Swab; Nasopharyngeal(NP) swabs in vial transport medium  Result Value Ref Range Status   SARS Coronavirus 2 by RT PCR NEGATIVE NEGATIVE Final    Comment: (NOTE) SARS-CoV-2 target nucleic acids are NOT DETECTED.  The SARS-CoV-2 RNA is generally detectable in upper respiratory specimens during the acute phase of infection. The lowest concentration of SARS-CoV-2 viral copies this assay can detect is 138 copies/mL. A negative result does not preclude SARS-Cov-2 infection and should not be used as the sole basis for treatment or other patient management decisions. A negative result may occur with  improper specimen collection/handling, submission of specimen other than nasopharyngeal swab, presence of viral mutation(s) within the areas targeted by this assay, and inadequate number of viral copies(<138 copies/mL). A negative result must be combined with clinical observations, patient history, and epidemiological information. The expected result is Negative.  Fact Sheet for Patients:   EntrepreneurPulse.com.au  Fact Sheet for Healthcare Providers:  IncredibleEmployment.be  This test is no t yet approved or cleared by the [place] FDA and  has been authorized for detection and/or diagnosis of SARS-CoV-2 by FDA under an Emergency Use Authorization (EUA). This EUA will remain  in effect (meaning this test can be used) for the duration of the COVID-19 declaration under Section 564(b)(1) of the Act, 21 U.S.C.section 360bbb-3(b)(1), unless the authorization is terminated  or revoked sooner.       Influenza A by PCR NEGATIVE NEGATIVE Final   Influenza B by PCR NEGATIVE NEGATIVE Final    Comment: (NOTE) The Xpert Xpress SARS-CoV-2/FLU/RSV plus assay is intended as an aid in the diagnosis of influenza from Nasopharyngeal swab specimens and should not be used as a sole basis for treatment. Nasal washings and aspirates are unacceptable for Xpert Xpress SARS-CoV-2/FLU/RSV testing.  Fact Sheet for Patients: EntrepreneurPulse.com.au  Fact Sheet for Healthcare Providers: IncredibleEmployment.be  This test is not yet approved or cleared by the [place] FDA and has been authorized for detection and/or diagnosis of SARS-CoV-2 by FDA under an Emergency Use Authorization (EUA). This EUA will remain in effect (meaning this test can be used) for the duration of the COVID-19 declaration under Section 564(b)(1) of the Act, 21 U.S.C. section 360bbb-3(b)(1), unless the authorization is terminated or revoked.  Performed at [facility], [address] [place], [place] [address]      Radiological Exams on Admission: DG Chest 2 View  Result Date: 08/15/2020 CLINICAL DATA:  Increasing shortness of breath for 2 months, orthopnea EXAM: CHEST - 2 VIEW COMPARISON:  11/28/2017 FINDINGS: Frontal and lateral views of the chest demonstrate mild enlargement the cardiac silhouette. There is  increased central vascular prominence with diffuse interstitial opacities throughout the lungs. Patchy bilateral infrahilar ground-glass airspace disease identified. No effusion or pneumothorax. No acute bony abnormalities. IMPRESSION: 1. Findings consistent with mild fluid overload  and interstitial edema. Electronically Signed   By: Randa Ngo M.D.   On: 08/15/2020 22:26    EKG: Independently reviewed. Atrial fibrillation.   Assessment/Plan   1. Acute on chronic diastolic CHF  - Presents with progressive SOB and orthopnea and is found to be tachypneic at rest, dyspneic with speech, and with increased vascular congestion and interstitial edema on CXR  - EF was 55-60% in July 2020   - She was given 80 mg IV Lasix in ED and is diuresing well  - Continue diuresis with 40 mg IV Lasix q12h, monitor I/O and weights, monitor electrolytes and renal function    2. CAD  - Hx of stent to mid LAD in 2017   - She had chest discomfort at rest 2 nights ago that resolved after 10 minutes  - HS troponin normal x2, no ischemic features on EKG   - Continue Imdur, metoprolol   3. Atrial fibrillation  - In rate-controlled a fib on admission  - Continue warfarin, sotalol, and metoprolol    4. Renal insufficiency  - SCr is 1.29 on admission, up from 1.01 in January 2022  - Renally-dose medications, monitor closely while diuresing    5. Hypothyroidism  - Continue Synthroid     DVT prophylaxis: warfarin  Code Status: DNR, confirmed with patient on admission  Level of Care: Level of care: Telemetry Cardiac Family Communication: None present  Disposition Plan:  Patient is from: home  Anticipated d/c is to: TBD Anticipated d/c date is: 08/18/20  Patient currently: Pending diuresis, echo, stable renal function  Consults called: None  Admission status: Inpatient     Vianne Bulls, MD [facility]  08/16/2020, 5:15 AM

## 2020-08-17 ENCOUNTER — Inpatient Hospital Stay (HOSPITAL_COMMUNITY): Payer: Medicare Other

## 2020-08-17 DIAGNOSIS — I5033 Acute on chronic diastolic (congestive) heart failure: Secondary | ICD-10-CM | POA: Diagnosis not present

## 2020-08-17 LAB — ECHOCARDIOGRAM COMPLETE
Area-P 1/2: 3.86 cm2
Calc EF: 50.5 %
Height: 64 in
S' Lateral: 2.9 cm
Single Plane A2C EF: 49.6 %
Single Plane A4C EF: 47.4 %
Weight: 4246.94 oz

## 2020-08-17 LAB — CBC
HCT: 43.9 % (ref 36.0–46.0)
Hemoglobin: 14.6 g/dL (ref 12.0–15.0)
MCH: 30 pg (ref 26.0–34.0)
MCHC: 33.3 g/dL (ref 30.0–36.0)
MCV: 90.3 fL (ref 80.0–100.0)
Platelets: 470 10*3/uL — ABNORMAL HIGH (ref 150–400)
RBC: 4.86 MIL/uL (ref 3.87–5.11)
RDW: 16.1 % — ABNORMAL HIGH (ref 11.5–15.5)
WBC: 9.5 10*3/uL (ref 4.0–10.5)
nRBC: 0 % (ref 0.0–0.2)

## 2020-08-17 LAB — BASIC METABOLIC PANEL
Anion gap: 10 (ref 5–15)
BUN: 13 mg/dL (ref 8–23)
CO2: 30 mmol/L (ref 22–32)
Calcium: 8.5 mg/dL — ABNORMAL LOW (ref 8.9–10.3)
Chloride: 98 mmol/L (ref 98–111)
Creatinine, Ser: 1.05 mg/dL — ABNORMAL HIGH (ref 0.44–1.00)
GFR, Estimated: 56 mL/min — ABNORMAL LOW (ref >60)
Glucose, Bld: 93 mg/dL (ref 70–99)
Potassium: 3.4 mmol/L — ABNORMAL LOW (ref 3.5–5.1)
Sodium: 138 mmol/L (ref 135–145)

## 2020-08-17 LAB — PROTIME-INR
INR: 2.2 — ABNORMAL HIGH (ref 0.8–1.2)
Prothrombin Time: 24.5 seconds — ABNORMAL HIGH (ref 11.4–15.2)

## 2020-08-17 MED ORDER — POTASSIUM CHLORIDE CRYS ER 20 MEQ PO TBCR
40.0000 meq | EXTENDED_RELEASE_TABLET | Freq: Once | ORAL | Status: AC
  Administered 2020-08-17: 40 meq via ORAL
  Filled 2020-08-17: qty 2

## 2020-08-17 MED ORDER — POTASSIUM CHLORIDE CRYS ER 20 MEQ PO TBCR
20.0000 meq | EXTENDED_RELEASE_TABLET | Freq: Once | ORAL | Status: AC
  Administered 2020-08-17: 20 meq via ORAL
  Filled 2020-08-17: qty 1

## 2020-08-17 NOTE — Care Management (Signed)
1302 08-17-20 Case Manager spoke with patient. Prior to arrival patient was from home alone. Patient states she still drives and can get grocery, medications from [place], and goes to her primary care provider without any issues. Patient states she does not use any assistive devices at this time. She states her family checks in on her often. Physical/Occupational Therapies have been ordered. Case Manager will continue to follow for additional disposition needs.

## 2020-08-17 NOTE — Progress Notes (Signed)
PROGRESS NOTE    [name]  ZOX:[id number] DOB: 10-29-1945 DOA: 08/15/2020 PCP: Elisabeth Cara, PA-C     Brief Narrative:  [name] is a 75 y.o. female with medical history significant for coronary artery disease, hypothyroidism, atrial fibrillation on warfarin, and BMI 46, now presenting to the emergency department for evaluation of shortness of breath, orthopnea, and chest discomfort.  Patient reports that she has been dealing with increased shortness of breath for approximately 2 months but then went on to develop orthopnea over the past 3 or 4 days and had an episode of chest discomfort while trying to rest in a recliner 2 nights ago.  She has not noticed any increased leg swelling, denies significant cough, and denies fever or chills.  She has had to sleep in a recliner due to orthopnea recently.  Her chest pain episode resolved after approximately 10 minutes, was localized to the central chest, and while she took 1 dose of nitroglycerin, she is not sure that that is what led to resolution. Chest x-ray showed increased vascular congestion and interstitial edema. Patient was given 80 mg IV Lasix in the ED and transferred to [facility] for ongoing evaluation and management.  New events last 24 hours / Subjective: Patient laying in bed, 30 degree incline. States she is feeling much better, no swelling, diuresed 2 L last 24 hours. She is concerned about going home as she lives alone.   Assessment & Plan:   Principal Problem:   Acute on chronic diastolic CHF (congestive heart failure) (HCC) Active Problems:   Persistent atrial fibrillation (HCC)   CAD (coronary artery disease)   Hypothyroidism   Renal insufficiency   Acute on chronic diastolic heart failure -BNP 168.7, with findings consistent with mild fluid overload, interstitial edema -Continue IV Lasix -Strict I's and O's, daily weight, fluid restriction diet -Echocardiogram pending  Coronary  artery disease -Troponin remains negative 5 --> 5 -Chest pain resolved -Continue Imdur, metoprolol  Chronic A. fib -Continue Coumadin, sotalol, Toprol  Hypothyroidism -Continue Synthroid  Hypokalemia -Replace, trend   DVT prophylaxis:   warfarin (COUMADIN) tablet 5 mg  warfarin (COUMADIN) tablet 7.5 mg  Code Status: Full code Family Communication: No family at bedside Disposition Plan:  Status is: Inpatient  Remains inpatient appropriate because:IV treatments appropriate due to intensity of illness or inability to take PO and Inpatient level of care appropriate due to severity of illness  Dispo: The patient is from: Home              Anticipated d/c is to: Home              Patient currently is not medically stable to d/c. Continue IV lasix one more day and deescalate to PO in AM. Echo pending. PT OT eval    Difficult to place patient No      Antimicrobials:  Anti-infectives (From admission, onward)    None        Objective: Vitals:   08/16/20 1224 08/16/20 1555 08/16/20 2017 08/17/20 0520  BP: 121/75 (!) 126/91 111/74 128/88  Pulse: 78 79 88 83  Resp: 20 (!) 22 16 20   Temp: (!) 97.4 F (36.3 C) 97.8 F (36.6 C) (!) 97.3 F (36.3 C) 97.6 F (36.4 C)  TempSrc: Oral Oral Oral Oral  SpO2: 95% 95% 95% 98%  Weight:    120.4 kg  Height:        Intake/Output Summary (Last 24 hours) at 08/17/2020 1038 Last data  filed at 08/17/2020 1022 Gross per 24 hour  Intake --  Output 2900 ml  Net -2900 ml    Filed Weights   08/15/20 2148 08/16/20 0140 08/17/20 0520  Weight: 119.3 kg 121.9 kg 120.4 kg   Examination: General exam: Appears calm and comfortable  Respiratory system: Clear to auscultation. Respiratory effort normal. On room air  Cardiovascular system: S1 & S2 heard, irreg rhythm. No pedal edema. Gastrointestinal system: Abdomen is nondistended, soft and nontender. Normal bowel sounds heard. Central nervous system: Alert and oriented. Non focal exam.  Speech clear  Extremities: Symmetric in appearance bilaterally  Skin: No rashes, lesions or ulcers on exposed skin  Psychiatry: Judgement and insight appear stable. Mood & affect appropriate.    Data Reviewed: I have personally reviewed following labs and imaging studies  CBC: Recent Labs  Lab 08/15/20 2209 08/16/20 0630 08/17/20 0134  WBC 8.3 9.3 9.5  HGB 14.4 14.7 14.6  HCT 44.6 44.3 43.9  MCV 92.1 91.2 90.3  PLT 467* 439* 470*    Basic Metabolic Panel: Recent Labs  Lab 08/15/20 2209 08/16/20 0630 08/17/20 0134  NA 139 139 138  K 3.9 3.4* 3.4*  CL 101 99 98  CO2 31 29 30   GLUCOSE 110* 106* 93  BUN 13 11 13   CREATININE 1.29* 0.99 1.05*  CALCIUM 8.4* 8.5* 8.5*    GFR: Estimated Creatinine Clearance: 60.1 mL/min (A) (by C-G formula based on SCr of 1.05 mg/dL (H)). Liver Function Tests: No results for input(s): AST, ALT, ALKPHOS, BILITOT, PROT, ALBUMIN in the last 168 hours. No results for input(s): LIPASE, AMYLASE in the last 168 hours. No results for input(s): AMMONIA in the last 168 hours. Coagulation Profile: Recent Labs  Lab 08/15/20 2209 08/17/20 0134  INR 2.1* 2.2*    Cardiac Enzymes: No results for input(s): CKTOTAL, CKMB, CKMBINDEX, TROPONINI in the last 168 hours. BNP (last 3 results) No results for input(s): PROBNP in the last 8760 hours. HbA1C: No results for input(s): HGBA1C in the last 72 hours. CBG: No results for input(s): GLUCAP in the last 168 hours. Lipid Profile: No results for input(s): CHOL, HDL, LDLCALC, TRIG, CHOLHDL, LDLDIRECT in the last 72 hours. Thyroid Function Tests: No results for input(s): TSH, T4TOTAL, FREET4, T3FREE, THYROIDAB in the last 72 hours. Anemia Panel: No results for input(s): VITAMINB12, FOLATE, FERRITIN, TIBC, IRON, RETICCTPCT in the last 72 hours. Sepsis Labs: No results for input(s): PROCALCITON, LATICACIDVEN in the last 168 hours.  Recent Results (from the past 240 hour(s))  Resp Panel by RT-PCR (Flu  A&B, Covid) Nasopharyngeal Swab     Status: None   Collection Time: 08/15/20 10:48 PM   Specimen: Nasopharyngeal Swab; Nasopharyngeal(NP) swabs in vial transport medium  Result Value Ref Range Status   SARS Coronavirus 2 by RT PCR NEGATIVE NEGATIVE Final    Comment: (NOTE) SARS-CoV-2 target nucleic acids are NOT DETECTED.  The SARS-CoV-2 RNA is generally detectable in upper respiratory specimens during the acute phase of infection. The lowest concentration of SARS-CoV-2 viral copies this assay can detect is 138 copies/mL. A negative result does not preclude SARS-Cov-2 infection and should not be used as the sole basis for treatment or other patient management decisions. A negative result may occur with  improper specimen collection/handling, submission of specimen other than nasopharyngeal swab, presence of viral mutation(s) within the areas targeted by this assay, and inadequate number of viral copies(<138 copies/mL). A negative result must be combined with clinical observations, patient history, and epidemiological information. The expected  result is Negative.  Fact Sheet for Patients:  EntrepreneurPulse.com.au  Fact Sheet for Healthcare Providers:  IncredibleEmployment.be  This test is no t yet approved or cleared by the [place] FDA and  has been authorized for detection and/or diagnosis of SARS-CoV-2 by FDA under an Emergency Use Authorization (EUA). This EUA will remain  in effect (meaning this test can be used) for the duration of the COVID-19 declaration under Section 564(b)(1) of the Act, 21 U.S.C.section 360bbb-3(b)(1), unless the authorization is terminated  or revoked sooner.       Influenza A by PCR NEGATIVE NEGATIVE Final   Influenza B by PCR NEGATIVE NEGATIVE Final    Comment: (NOTE) The Xpert Xpress SARS-CoV-2/FLU/RSV plus assay is intended as an aid in the diagnosis of influenza from Nasopharyngeal swab specimens  and should not be used as a sole basis for treatment. Nasal washings and aspirates are unacceptable for Xpert Xpress SARS-CoV-2/FLU/RSV testing.  Fact Sheet for Patients: EntrepreneurPulse.com.au  Fact Sheet for Healthcare Providers: IncredibleEmployment.be  This test is not yet approved or cleared by the [place] FDA and has been authorized for detection and/or diagnosis of SARS-CoV-2 by FDA under an Emergency Use Authorization (EUA). This EUA will remain in effect (meaning this test can be used) for the duration of the COVID-19 declaration under Section 564(b)(1) of the Act, 21 U.S.C. section 360bbb-3(b)(1), unless the authorization is terminated or revoked.  Performed at [facility], [address], [place] [address]        Radiology Studies: DG Chest 2 View  Result Date: 08/15/2020 CLINICAL DATA:  Increasing shortness of breath for 2 months, orthopnea EXAM: CHEST - 2 VIEW COMPARISON:  11/28/2017 FINDINGS: Frontal and lateral views of the chest demonstrate mild enlargement the cardiac silhouette. There is increased central vascular prominence with diffuse interstitial opacities throughout the lungs. Patchy bilateral infrahilar ground-glass airspace disease identified. No effusion or pneumothorax. No acute bony abnormalities. IMPRESSION: 1. Findings consistent with mild fluid overload and interstitial edema. Electronically Signed   By: Randa Ngo M.D.   On: 08/15/2020 22:26      Scheduled Meds:  furosemide  40 mg Intravenous BID   isosorbide mononitrate  30 mg Oral Daily   levothyroxine  50 mcg Oral QAC breakfast   metoprolol succinate  25 mg Oral Daily   potassium chloride  20 mEq Oral Once   sodium chloride flush  3 mL Intravenous Q12H   sotalol  120 mg Oral BID   warfarin  5 mg Oral Once per day on Sun Mon Wed Thu Sat   [START ON 08/18/2020] warfarin  7.5 mg Oral Once per day on Tue Fri   Warfarin -  Pharmacist Dosing Inpatient   Does not apply q1600   Continuous Infusions:  sodium chloride       LOS: 1 day      Time spent: 20 minutes   Dessa Phi, DO [facility] 08/17/2020, 10:38 AM   Available via Epic secure chat 7am-7pm After these hours, please refer to coverage provider listed on [email or web address]

## 2020-08-17 NOTE — Progress Notes (Signed)
  Echocardiogram 2D Echocardiogram has been performed.  [name] 08/17/2020, 11:55 AM

## 2020-08-17 NOTE — Progress Notes (Signed)
Mobility Specialist: Progress Note   08/17/20 1553  Mobility  Activity Ambulated in room  Level of Assistance Standby assist, set-up cues, supervision of patient - no hands on  Assistive Device None  Distance Ambulated (ft) 100 ft (50'x2)  Mobility Ambulated with assistance in room  Mobility Response Tolerated well  Mobility performed by Mobility specialist  $Mobility charge 1 Mobility   Pre-Mobility: 79 HR, 136/72 BP During Mobility: 95 HR, 91% SpO2 Post-Mobility: 84 HR  Pt c/o LLE and back soreness prior to ambulation. Pt ambulated independently in the room requiring one seated break in the chair lasting roughly 30 seconds. Pt back to bed per request with call bell and phone at her side.   [facility] [name] Mobility Specialist Mobility Specialist Phone: [phone]

## 2020-08-17 NOTE — Progress Notes (Signed)
ANTICOAGULATION CONSULT NOTE - Initial Consult  Pharmacy Consult for Warfarin  Indication: atrial fibrillation  Allergies  Allergen Reactions   Ceftriaxone Rash and Other (See Comments)    "TTP"/Was hospitalized and in a coma for 60 days, per patient (Thrombotic Thrombocytopenic Purpura)   Penicillins Hives, Rash and Other (See Comments)    Cannot tolerate any "-CILLINS"; causes welts!! Has patient had a PCN reaction causing immediate rash, facial/tongue/throat swelling, SOB or lightheadedness with hypotension: Yes Has patient had a PCN reaction causing severe rash involving mucus membranes or skin necrosis: No Has patient had a PCN reaction that required hospitalization No Has patient had a PCN reaction occurring within the last 10 years: No If all of the above answers are "NO", then may proceed with Cephalosporin use.   Levofloxacin Other (See Comments)    Thrombocytopenia     Levofloxacin Other (See Comments)    Dropped blood platelets extremely low   Oxycodone Itching   Tape Other (See Comments)    Prefers paper or cloth tape Other reaction(s): Other (See Comments) Prefers paper or cloth tape Other reaction(s): Other (See Comments) Prefers paper or cloth tape   Alendronate Rash   Aspirin Other (See Comments)    Patient stated she is unable to take due to blood thinner    Oxycodone-Acetaminophen Itching   Penicillin G Rash    Patient Measurements: Height: 5\' 4"  (162.6 cm) Weight: 120.4 kg (265 lb 6.9 oz) IBW/kg (Calculated) : 54.7   Vital Signs: Temp: 97.6 F (36.4 C) (07/18 0520) Temp Source: Oral (07/18 0520) BP: 128/88 (07/18 0520) Pulse Rate: 83 (07/18 0520)  Labs: Recent Labs    08/15/20 2209 08/16/20 0044 08/16/20 0630 08/17/20 0134  HGB 14.4  --  14.7 14.6  HCT 44.6  --  44.3 43.9  PLT 467*  --  439* 470*  LABPROT 23.8*  --   --  24.5*  INR 2.1*  --   --  2.2*  CREATININE 1.29*  --  0.99 1.05*  TROPONINIHS 5 5  --   --      Estimated  Creatinine Clearance: 60.1 mL/min (A) (by C-G formula based on SCr of 1.05 mg/dL (H)).   Medical History: Past Medical History:  Diagnosis Date   CAD (coronary artery disease)    a. LHC 3/17: mLAD 80, D1 50, pRCA 30, EF 25-35%; PCI: 3.5 x 16 mm Synergy DES to mLAD // Myoview 08/2018: EF 54, normal perfusion; Low Risk   Chronic diastolic CHF 4/58/0998   Echocardiogram 08/2018: EF 55-60, mild septal basal hypertrophy, Gr 2 DD, elevated LVEDP, mild LAE, mild AI   Dilated cardiomyopathy >> EF returned to normal in NSR    Probable Tachycardia Mediated // a. Echo 12/16: Mild concentric LVH, EF 25-30%, anteroseptal akinesis, inferoseptal akinesis, anterior akinesis, trivial AI, mild MR, moderate LAE, trivial TR, trivial PI, PASP 33 mmHg  //  b. Echo 2/17: Mild LVH, EF 25-30%, diffuse HK, mild LAE  //  c. Echo 7/17: mild LVH, EF 50-55%, no RWMA, trivial AI, mild to mod LAE, mild RAE   Febrile seizure ([place]) 2004   "during TTP thing; cause my temp was 106"   High cholesterol    History of blood transfusion 2004   "several; related to TTP"   Hypertension    Hypothyroidism    Morbid obesity ([place]) 01/07/2013   Osteoarthritis of back    Paroxysmal atrial fibrillation ([place]) 08/01/2011   a. 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 On warfarin PTA for afib.  INR is therapeutic at 2.2 on home regimen (warfarin 7.5 mg Tues/Fri, 5 mg all other days), CBC stable.    Goal of Therapy:  INR 2-3 Monitor platelets by anticoagulation protocol: Yes   Plan:  Warfarin 7.5 mg Tues/Friday, 5 mg all other days. Monitor daily PT/INR, and for sign/symptoms of bleeding.  Vance Peper, PharmD PGY1 Resident 08/17/2020 10:03 AM   Please check AMION for all [place] phone numbers After 10:00 PM, call [place] [phone]

## 2020-08-18 DIAGNOSIS — I5033 Acute on chronic diastolic (congestive) heart failure: Secondary | ICD-10-CM | POA: Diagnosis not present

## 2020-08-18 LAB — BASIC METABOLIC PANEL
Anion gap: 6 (ref 5–15)
BUN: 14 mg/dL (ref 8–23)
CO2: 33 mmol/L — ABNORMAL HIGH (ref 22–32)
Calcium: 8.8 mg/dL — ABNORMAL LOW (ref 8.9–10.3)
Chloride: 97 mmol/L — ABNORMAL LOW (ref 98–111)
Creatinine, Ser: 1.11 mg/dL — ABNORMAL HIGH (ref 0.44–1.00)
GFR, Estimated: 52 mL/min — ABNORMAL LOW (ref >60)
Glucose, Bld: 104 mg/dL — ABNORMAL HIGH (ref 70–99)
Potassium: 3.8 mmol/L (ref 3.5–5.1)
Sodium: 136 mmol/L (ref 135–145)

## 2020-08-18 LAB — PROTIME-INR
INR: 2 — ABNORMAL HIGH (ref 0.8–1.2)
Prothrombin Time: 23 seconds — ABNORMAL HIGH (ref 11.4–15.2)

## 2020-08-18 LAB — MAGNESIUM: Magnesium: 2 mg/dL (ref 1.7–2.4)

## 2020-08-18 MED ORDER — POTASSIUM CHLORIDE CRYS ER 20 MEQ PO TBCR
40.0000 meq | EXTENDED_RELEASE_TABLET | Freq: Once | ORAL | Status: AC
  Administered 2020-08-18: 40 meq via ORAL
  Filled 2020-08-18: qty 2

## 2020-08-18 MED ORDER — FUROSEMIDE 40 MG PO TABS
40.0000 mg | ORAL_TABLET | Freq: Every day | ORAL | Status: DC
  Administered 2020-08-19: 40 mg via ORAL
  Filled 2020-08-18 (×2): qty 1

## 2020-08-18 NOTE — Progress Notes (Signed)
PROGRESS NOTE    [name]  OEU:[id number] DOB: Dec 25, 1945 DOA: 08/15/2020 PCP: Elisabeth Cara, PA-C     Brief Narrative:  [name] is a 75 y.o. female with medical history significant for coronary artery disease, hypothyroidism, atrial fibrillation on warfarin, and BMI 46, now presenting to the emergency department for evaluation of shortness of breath, orthopnea, and chest discomfort.  Patient reports that she has been dealing with increased shortness of breath for approximately 2 months but then went on to develop orthopnea over the past 3 or 4 days and had an episode of chest discomfort while trying to rest in a recliner 2 nights ago.  She has not noticed any increased leg swelling, denies significant cough, and denies fever or chills.  She has had to sleep in a recliner due to orthopnea recently.  Her chest pain episode resolved after approximately 10 minutes, was localized to the central chest, and while she took 1 dose of nitroglycerin, she is not sure that that is what led to resolution. Chest x-ray showed increased vascular congestion and interstitial edema. Patient was given 80 mg IV Lasix in the ED and transferred to [facility] for ongoing evaluation and management.  New events last 24 hours / Subjective: No new complaints, patient diuresed 2.8 L last 24 hours.  She does not have anyone to stay home with her today, prefers to go home tomorrow.  OT recommended home health, 24-hour supervision  Assessment & Plan:   Principal Problem:   Acute on chronic diastolic CHF (congestive heart failure) (HCC) Active Problems:   Persistent atrial fibrillation (HCC)   CAD (coronary artery disease)   Hypothyroidism   Renal insufficiency   Acute on chronic diastolic heart failure -BNP 168.7, with findings consistent with mild fluid overload, interstitial edema -Echocardiogram 15%, grade 1 diastolic dysfunction -Strict I's and O's, daily weight, fluid  restriction diet -Diuresed well with IV Lasix, changed to p.o. today  Coronary artery disease -Troponin remains negative 5 --> 5 -Chest pain resolved -Continue Imdur, metoprolol  Chronic A. fib -Continue Coumadin, sotalol, Toprol  Hypothyroidism -Continue Synthroid   DVT prophylaxis:   warfarin (COUMADIN) tablet 5 mg  warfarin (COUMADIN) tablet 7.5 mg  Code Status: Full code Family Communication: No family at bedside Disposition Plan:  Status is: Inpatient  Remains inpatient appropriate because:IV treatments appropriate due to intensity of illness or inability to take PO and Inpatient level of care appropriate due to severity of illness  Dispo: The patient is from: Home              Anticipated d/c is to: Home              Patient currently is not medically stable to d/c. Continue IV lasix one more day and deescalate to PO in AM. Echo pending. PT OT eval    Difficult to place patient No      Antimicrobials:  Anti-infectives (From admission, onward)    None        Objective: Vitals:   08/17/20 1115 08/17/20 1410 08/17/20 2100 08/18/20 0624  BP: (!) 132/91 93/61 115/73 135/87  Pulse: 74 91  77  Resp: 15 18  17   Temp: 97.6 F (36.4 C) (!) 97.5 F (36.4 C) 98.3 F (36.8 C) 97.8 F (36.6 C)  TempSrc: Oral Oral Oral Oral  SpO2: 93% 92% 95% 90%  Weight:    120.7 kg  Height:        Intake/Output Summary (Last 24  hours) at 08/18/2020 1136 Last data filed at 08/18/2020 0929 Gross per 24 hour  Intake 270 ml  Output 3000 ml  Net -2730 ml    Filed Weights   08/16/20 0140 08/17/20 0520 08/18/20 0624  Weight: 121.9 kg 120.4 kg 120.7 kg     Examination: General exam: Appears calm and comfortable  Respiratory system: Clear to auscultation. Respiratory effort normal. On room air  Cardiovascular system: S1 & S2 heard, Irreg rhythm. No pedal edema. Gastrointestinal system: Abdomen is nondistended, soft and nontender. Normal bowel sounds heard. Central nervous  system: Alert and oriented. Non focal exam. Speech clear  Extremities: Symmetric in appearance bilaterally  Skin: No rashes, lesions or ulcers on exposed skin  Psychiatry: Judgement and insight appear stable. Mood & affect appropriate.    Data Reviewed: I have personally reviewed following labs and imaging studies  CBC: Recent Labs  Lab 08/15/20 2209 08/16/20 0630 08/17/20 0134  WBC 8.3 9.3 9.5  HGB 14.4 14.7 14.6  HCT 44.6 44.3 43.9  MCV 92.1 91.2 90.3  PLT 467* 439* 470*    Basic Metabolic Panel: Recent Labs  Lab 08/15/20 2209 08/16/20 0630 08/17/20 0134 08/18/20 0145  NA 139 139 138 136  K 3.9 3.4* 3.4* 3.8  CL 101 99 98 97*  CO2 31 29 30  33*  GLUCOSE 110* 106* 93 104*  BUN 13 11 13 14   CREATININE 1.29* 0.99 1.05* 1.11*  CALCIUM 8.4* 8.5* 8.5* 8.8*  MG  --   --   --  2.0    GFR: Estimated Creatinine Clearance: 56.9 mL/min (A) (by C-G formula based on SCr of 1.11 mg/dL (H)). Liver Function Tests: No results for input(s): AST, ALT, ALKPHOS, BILITOT, PROT, ALBUMIN in the last 168 hours. No results for input(s): LIPASE, AMYLASE in the last 168 hours. No results for input(s): AMMONIA in the last 168 hours. Coagulation Profile: Recent Labs  Lab 08/15/20 2209 08/17/20 0134 08/18/20 0145  INR 2.1* 2.2* 2.0*    Cardiac Enzymes: No results for input(s): CKTOTAL, CKMB, CKMBINDEX, TROPONINI in the last 168 hours. BNP (last 3 results) No results for input(s): PROBNP in the last 8760 hours. HbA1C: No results for input(s): HGBA1C in the last 72 hours. CBG: No results for input(s): GLUCAP in the last 168 hours. Lipid Profile: No results for input(s): CHOL, HDL, LDLCALC, TRIG, CHOLHDL, LDLDIRECT in the last 72 hours. Thyroid Function Tests: No results for input(s): TSH, T4TOTAL, FREET4, T3FREE, THYROIDAB in the last 72 hours. Anemia Panel: No results for input(s): VITAMINB12, FOLATE, FERRITIN, TIBC, IRON, RETICCTPCT in the last 72 hours. Sepsis Labs: No results  for input(s): PROCALCITON, LATICACIDVEN in the last 168 hours.  Recent Results (from the past 240 hour(s))  Resp Panel by RT-PCR (Flu A&B, Covid) Nasopharyngeal Swab     Status: None   Collection Time: 08/15/20 10:48 PM   Specimen: Nasopharyngeal Swab; Nasopharyngeal(NP) swabs in vial transport medium  Result Value Ref Range Status   SARS Coronavirus 2 by RT PCR NEGATIVE NEGATIVE Final    Comment: (NOTE) SARS-CoV-2 target nucleic acids are NOT DETECTED.  The SARS-CoV-2 RNA is generally detectable in upper respiratory specimens during the acute phase of infection. The lowest concentration of SARS-CoV-2 viral copies this assay can detect is 138 copies/mL. A negative result does not preclude SARS-Cov-2 infection and should not be used as the sole basis for treatment or other patient management decisions. A negative result may occur with  improper specimen collection/handling, submission of specimen other than nasopharyngeal swab,  presence of viral mutation(s) within the areas targeted by this assay, and inadequate number of viral copies(<138 copies/mL). A negative result must be combined with clinical observations, patient history, and epidemiological information. The expected result is Negative.  Fact Sheet for Patients:  EntrepreneurPulse.com.au  Fact Sheet for Healthcare Providers:  IncredibleEmployment.be  This test is no t yet approved or cleared by the [place] FDA and  has been authorized for detection and/or diagnosis of SARS-CoV-2 by FDA under an Emergency Use Authorization (EUA). This EUA will remain  in effect (meaning this test can be used) for the duration of the COVID-19 declaration under Section 564(b)(1) of the Act, 21 U.S.C.section 360bbb-3(b)(1), unless the authorization is terminated  or revoked sooner.       Influenza A by PCR NEGATIVE NEGATIVE Final   Influenza B by PCR NEGATIVE NEGATIVE Final    Comment: (NOTE) The  Xpert Xpress SARS-CoV-2/FLU/RSV plus assay is intended as an aid in the diagnosis of influenza from Nasopharyngeal swab specimens and should not be used as a sole basis for treatment. Nasal washings and aspirates are unacceptable for Xpert Xpress SARS-CoV-2/FLU/RSV testing.  Fact Sheet for Patients: EntrepreneurPulse.com.au  Fact Sheet for Healthcare Providers: IncredibleEmployment.be  This test is not yet approved or cleared by the [place] FDA and has been authorized for detection and/or diagnosis of SARS-CoV-2 by FDA under an Emergency Use Authorization (EUA). This EUA will remain in effect (meaning this test can be used) for the duration of the COVID-19 declaration under Section 564(b)(1) of the Act, 21 U.S.C. section 360bbb-3(b)(1), unless the authorization is terminated or revoked.  Performed at [facility], [address], [place], [place] [address]        Radiology Studies: ECHOCARDIOGRAM COMPLETE  Result Date: 08/17/2020    ECHOCARDIOGRAM REPORT   Patient Name:   [name] Weimer Date of Exam: 08/17/2020 Medical Rec #:  [id number]          Height:       64.0 in Accession #:    [id number]         Weight:       265.4 lb Date of Birth:  1945/03/20          BSA:          2.206 m Patient Age:    13 years           BP:           128/88 mmHg Patient Gender: F                  HR:           76 bpm. Exam Location:  Inpatient Procedure: 2D Echo, Cardiac Doppler and Color Doppler Indications:    I50.40* Unspecified combined systolic (congestive) and diastolic                 (congestive) heart failure  History:        Patient has prior history of Echocardiogram examinations, most                 recent 08/23/2018. CHF, CAD, Abnormal ECG, Arrythmias:Atrial                 Fibrillation, Signs/Symptoms:Dyspnea; Risk Factors:Hypertension                 and Dyslipidemia.  Sonographer:    Roseanna Rainbow RDCS Referring Phys: [id number] TIMOTHY S OPYD   Sonographer Comments: Technically difficult study due  to poor echo windows and patient is morbidly obese. Image acquisition challenging due to patient body habitus. IMPRESSIONS  1. Left ventricular ejection fraction, by estimation, is 50 to 55%. The left ventricle has low normal function. The left ventricle has no regional wall motion abnormalities. There is moderate left ventricular hypertrophy. Left ventricular diastolic parameters are consistent with Grade I diastolic dysfunction (impaired relaxation).  2. Right ventricular systolic function is mildly reduced. The right ventricular size is mildly enlarged.  3. The mitral valve is normal in structure. No evidence of mitral valve regurgitation. No evidence of mitral stenosis.  4. The aortic valve is normal in structure. Aortic valve regurgitation is trivial. Mild to moderate aortic valve sclerosis/calcification is present, without any evidence of aortic stenosis.  5. The inferior vena cava is normal in size with greater than 50% respiratory variability, suggesting right atrial pressure of 3 mmHg. FINDINGS  Left Ventricle: Left ventricular ejection fraction, by estimation, is 50 to 55%. The left ventricle has low normal function. The left ventricle has no regional wall motion abnormalities. The left ventricular internal cavity size was normal in size. There is moderate left ventricular hypertrophy. Left ventricular diastolic parameters are consistent with Grade I diastolic dysfunction (impaired relaxation). Right Ventricle: The right ventricular size is mildly enlarged. No increase in right ventricular wall thickness. Right ventricular systolic function is mildly reduced. Left Atrium: Left atrial size was normal in size. Right Atrium: Right atrial size was normal in size. Pericardium: There is no evidence of pericardial effusion. Mitral Valve: The mitral valve is normal in structure. No evidence of mitral valve regurgitation. No evidence of mitral valve stenosis.  Tricuspid Valve: The tricuspid valve is normal in structure. Tricuspid valve regurgitation is mild . No evidence of tricuspid stenosis. Aortic Valve: The aortic valve is normal in structure. Aortic valve regurgitation is trivial. Mild to moderate aortic valve sclerosis/calcification is present, without any evidence of aortic stenosis. Pulmonic Valve: The pulmonic valve was normal in structure. Pulmonic valve regurgitation is trivial. No evidence of pulmonic stenosis. Aorta: The aortic root is normal in size and structure. Venous: The inferior vena cava is normal in size with greater than 50% respiratory variability, suggesting right atrial pressure of 3 mmHg. IAS/Shunts: No atrial level shunt detected by color flow Doppler.  LEFT VENTRICLE PLAX 2D LVIDd:         3.50 cm LVIDs:         2.90 cm LV PW:         1.70 cm LV IVS:        1.30 cm LVOT diam:     1.80 cm LV SV:         53 LV SV Index:   24 LVOT Area:     2.54 cm  LV Volumes (MOD) LV vol d, MOD A2C: 59.1 ml LV vol d, MOD A4C: 77.9 ml LV vol s, MOD A2C: 29.8 ml LV vol s, MOD A4C: 41.0 ml LV SV MOD A2C:     29.3 ml LV SV MOD A4C:     77.9 ml LV SV MOD BP:      35.7 ml IVC IVC diam: 2.30 cm LEFT ATRIUM           Index       RIGHT ATRIUM           Index LA diam:      3.30 cm 1.50 cm/m  RA Area:     20.10 cm LA Vol (A2C): 26.4 ml 11.96  ml/m RA Volume:   62.90 ml  28.51 ml/m LA Vol (A4C): 52.9 ml 23.98 ml/m  AORTIC VALVE LVOT Vmax:   107.00 cm/s LVOT Vmean:  80.400 cm/s LVOT VTI:    0.207 m  AORTA Ao Root diam: 3.30 cm Ao Asc diam:  3.20 cm MITRAL VALVE MV Area (PHT): 3.86 cm    SHUNTS MV Decel Time: 197 msec    Systemic VTI:  0.21 m MV E velocity: 72.77 cm/s  Systemic Diam: 1.80 cm [name] MD Electronically signed by [name] MD Signature Date/Time: 08/17/2020/1:22:17 PM    Final       Scheduled Meds:  furosemide  40 mg Oral Daily   isosorbide mononitrate  30 mg Oral Daily   levothyroxine  50 mcg Oral QAC breakfast   metoprolol succinate  25 mg  Oral Daily   sodium chloride flush  3 mL Intravenous Q12H   sotalol  120 mg Oral BID   warfarin  5 mg Oral Once per day on Sun Mon Wed Thu Sat   warfarin  7.5 mg Oral Once per day on Tue Fri   Warfarin - Pharmacist Dosing Inpatient   Does not apply q1600   Continuous Infusions:  sodium chloride       LOS: 2 days      Time spent: 20 minutes   Dessa Phi, DO [facility] 08/18/2020, 11:36 AM   Available via Epic secure chat 7am-7pm After these hours, please refer to coverage provider listed on [email or web address]

## 2020-08-18 NOTE — Evaluation (Signed)
Occupational Therapy Evaluation Patient Details Name: [name] MRN: [id number] DOB: 11-30-1945 Today's Date: 08/18/2020    History of Present Illness Pt is a 75 yr old female who presented due to SOB, edema in BLE and  chest discomfort. Pt admitted for Acute on chronic diastolic CHF.   PMH but not limited to: CAD, hypothyroidism, afib   Clinical Impression   Pt  at PLOF lives alone and does not use DME/AE. Pt in session was able to complete bed mobility, sit to stand and pivot transfer with min guard. Pt however was limited due to vitals as BP in supine was 135/70, sitting at EOB 152/78, stand pivot transfer to chair 153/104 and then placed pt into recline position in chair 152/84. Pt's HR 64-90 and o2 remaining in 90% on room air. Pt currently with functional limitations due to the deficits listed below (see OT Problem List).  Pt will benefit from skilled OT to increase their safety and independence with ADL and functional mobility for ADL to facilitate discharge to venue listed below.      Follow Up Recommendations  Home health OT;Supervision/Assistance - 24 hour (Pt limited by participation due to vitals/dizziness and may need change in status)    Equipment Recommendations       Recommendations for Other Services       Precautions / Restrictions Precautions Precautions: Other (comment) Precaution Comments: BP, HR      Mobility Bed Mobility Overal bed mobility: Modified Independent             General bed mobility comments: grab bars    Transfers Overall transfer level: Needs assistance   Transfers: Sit to/from Stand Sit to Stand: Min guard         General transfer comment: cues on slow movements    Balance Overall balance assessment: Needs assistance   Sitting balance-Leahy Scale: Fair Sitting balance - Comments: as pt became dizzy required BUE support                                   ADL either performed or assessed with clinical  judgement   ADL Overall ADL's : Needs assistance/impaired Eating/Feeding: Independent;Sitting   Grooming: Wash/dry hands;Wash/dry face;Sitting;Set up   Upper Body Bathing: Set up;Cueing for safety;Cueing for sequencing;Sitting   Lower Body Bathing: Minimal assistance;Sit to/from stand   Upper Body Dressing : Set up;Sitting   Lower Body Dressing: Minimal assistance;Sit to/from stand   Toilet Transfer: Min guard;BSC;Cueing for safety;Cueing for sequencing   Toileting- Water quality scientist and Hygiene: Cueing for safety;Cueing for sequencing;Sit to/from stand;Minimal assistance       Functional mobility during ADLs: Min guard General ADL Comments: Pt limited due to vitals at this time     Vision         Perception     Praxis      Pertinent Vitals/Pain Pain Assessment: No/denies pain     Hand Dominance Right   Extremity/Trunk Assessment Upper Extremity Assessment Upper Extremity Assessment: Overall WFL for tasks assessed   Lower Extremity Assessment Lower Extremity Assessment: Defer to PT evaluation       Communication Communication Communication: No difficulties   Cognition Arousal/Alertness: Awake/alert                                         General Comments  Exercises     Shoulder Instructions      Home Living Family/patient expects to be discharged to:: Private residence Living Arrangements: Alone Available Help at Discharge: Family;Available PRN/intermittently (5-6 mins away has daughters) Type of Home: House Home Access: Level entry     Home Layout: One level     Bathroom Shower/Tub: Tub/shower unit;Curtain   Biochemist, clinical: Standard     Home Equipment: Shower seat;Bedside commode;Walker - 4 wheels          Prior Functioning/Environment Level of Independence: Independent                 OT Problem List: Decreased strength;Decreased range of motion;Decreased activity tolerance;Impaired balance  (sitting and/or standing);Decreased safety awareness;Decreased knowledge of use of DME or AE      OT Treatment/Interventions: Self-care/ADL training;Therapeutic exercise;DME and/or AE instruction;Therapeutic activities;Patient/family education;Balance training    OT Goals(Current goals can be found in the care plan section) Acute Rehab OT Goals Patient Stated Goal: To go for a walk later OT Goal Formulation: With patient Time For Goal Achievement: 08/29/20 Potential to Achieve Goals: Good ADL Goals Pt Will Perform Upper Body Bathing: with supervision;Independently;standing;sitting Pt Will Perform Lower Body Bathing: Independently;sit to/from stand Pt Will Perform Tub/Shower Transfer: Tub transfer;with modified independence;ambulating;shower seat  OT Frequency: Min 2X/week   Barriers to D/C:            Co-evaluation              AM-PAC OT "6 Clicks" Daily Activity     Outcome Measure Help from another person eating meals?: None Help from another person taking care of personal grooming?: None Help from another person toileting, which includes using toliet, bedpan, or urinal?: A Little Help from another person bathing (including washing, rinsing, drying)?: A Little Help from another person to put on and taking off regular upper body clothing?: None Help from another person to put on and taking off regular lower body clothing?: A Little 6 Click Score: 21   End of Session Equipment Utilized During Treatment: Gait belt Nurse Communication: Other (comment) (vitals)  Activity Tolerance: Other (comment) (vitals/dizziness) Patient left: in chair;with call bell/phone within reach;with chair alarm set  OT Visit Diagnosis: Unsteadiness on feet (R26.81);Other abnormalities of gait and mobility (R26.89)                Time: 1010-1056 OT Time Calculation (min): 46 min Charges:  OT General Charges $OT Visit: 1 Visit OT Evaluation $OT Eval Low Complexity: 1 Low OT Treatments $Self  Care/Home Management : 23-37 mins  Joeseph Amor OTR/L  Acute Rehab Services  [phone] office number [phone] pager number   Joeseph Amor 08/18/2020, 11:14 AM

## 2020-08-18 NOTE — Progress Notes (Signed)
ANTICOAGULATION CONSULT NOTE - Initial Consult  Pharmacy Consult for Warfarin  Indication: atrial fibrillation  Allergies  Allergen Reactions   Ceftriaxone Rash and Other (See Comments)    "TTP"/Was hospitalized and in a coma for 60 days, per patient (Thrombotic Thrombocytopenic Purpura)   Penicillins Hives, Rash and Other (See Comments)    Cannot tolerate any "-CILLINS"; causes welts!! Has patient had a PCN reaction causing immediate rash, facial/tongue/throat swelling, SOB or lightheadedness with hypotension: Yes Has patient had a PCN reaction causing severe rash involving mucus membranes or skin necrosis: No Has patient had a PCN reaction that required hospitalization No Has patient had a PCN reaction occurring within the last 10 years: No If all of the above answers are "NO", then may proceed with Cephalosporin use.   Levofloxacin Other (See Comments)    Thrombocytopenia     Levofloxacin Other (See Comments)    Dropped blood platelets extremely low   Oxycodone Itching   Tape Other (See Comments)    Prefers paper or cloth tape Other reaction(s): Other (See Comments) Prefers paper or cloth tape Other reaction(s): Other (See Comments) Prefers paper or cloth tape   Alendronate Rash   Aspirin Other (See Comments)    Patient stated she is unable to take due to blood thinner    Oxycodone-Acetaminophen Itching   Penicillin G Rash    Patient Measurements: Height: 5\' 4"  (162.6 cm) Weight: 120.7 kg (266 lb 1.5 oz) IBW/kg (Calculated) : 54.7   Vital Signs: Temp: 97.8 F (36.6 C) (07/19 0624) Temp Source: Oral (07/19 0624) BP: 135/87 (07/19 0624) Pulse Rate: 77 (07/19 0624)  Labs: Recent Labs    08/15/20 2209 08/16/20 0044 08/16/20 0630 08/17/20 0134 08/18/20 0145  HGB 14.4  --  14.7 14.6  --   HCT 44.6  --  44.3 43.9  --   PLT 467*  --  439* 470*  --   LABPROT 23.8*  --   --  24.5* 23.0*  INR 2.1*  --   --  2.2* 2.0*  CREATININE 1.29*  --  0.99 1.05* 1.11*   TROPONINIHS 5 5  --   --   --      Estimated Creatinine Clearance: 56.9 mL/min (A) (by C-G formula based on SCr of 1.11 mg/dL (H)).   Medical History: Past Medical History:  Diagnosis Date   CAD (coronary artery disease)    a. LHC 3/17: mLAD 80, D1 50, pRCA 30, EF 25-35%; PCI: 3.5 x 16 mm Synergy DES to mLAD // Myoview 08/2018: EF 54, normal perfusion; Low Risk   Chronic diastolic CHF 2/83/1517   Echocardiogram 08/2018: EF 55-60, mild septal basal hypertrophy, Gr 2 DD, elevated LVEDP, mild LAE, mild AI   Dilated cardiomyopathy >> EF returned to normal in NSR    Probable Tachycardia Mediated // a. Echo 12/16: Mild concentric LVH, EF 25-30%, anteroseptal akinesis, inferoseptal akinesis, anterior akinesis, trivial AI, mild MR, moderate LAE, trivial TR, trivial PI, PASP 33 mmHg  //  b. Echo 2/17: Mild LVH, EF 25-30%, diffuse HK, mild LAE  //  c. Echo 7/17: mild LVH, EF 50-55%, no RWMA, trivial AI, mild to mod LAE, mild RAE   Febrile seizure ([place]) 2004   "during TTP thing; cause my temp was 106"   High cholesterol    History of blood transfusion 2004   "several; related to TTP"   Hypertension    Hypothyroidism    Morbid obesity ([place]) 01/07/2013   Osteoarthritis of back  Paroxysmal atrial fibrillation ([place]) 08/01/2011   a. 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 On warfarin PTA for afib.  INR is therapeutic at 2 on home regimen (warfarin 7.5 mg Tues/Fri, 5 mg all other days), CBC stable.  Eating 100%. No new DDI. Therapeutic x3, will decrease INR frequency.   Goal of Therapy:  INR 2-3 Monitor platelets by anticoagulation protocol: Yes   Plan:  Scheduled Warfarin 7.5 mg Tues/Friday, 5 mg all other days. Monitor INR Q Friday,  s/sx bleeding   Benetta Spar, PharmD, BCPS, [facility] Clinical Pharmacist  Please check AMION for all [place] phone numbers After 10:00 PM, call [place] [phone]

## 2020-08-18 NOTE — Evaluation (Signed)
Physical Therapy Evaluation Patient Details Name: [name] MRN: [id number] DOB: 12-18-1945 Today's Date: 08/18/2020   History of Present Illness  Pt is a 75 yr old female who presented due to SOB, edema in BLE and  chest discomfort. Pt admitted for Acute on chronic diastolic CHF.   PMH but not limited to: CAD, hypothyroidism, afib  Clinical Impression  Pt presents to PT with decr mobility primarily due to dizziness today. BP's 130's-120's/70-80's so doubt dizziness is due to orthostasis. If dizziness resolves mobility should improve and hopefully pt can return to baseline.     Follow Up Recommendations Home health PT;Supervision - Intermittent    Equipment Recommendations  None recommended by PT    Recommendations for Other Services       Precautions / Restrictions Precautions Precautions: Other (comment) Precaution Comments: BP, HR      Mobility  Bed Mobility Overal bed mobility: Modified Independent             General bed mobility comments: Use of rails    Transfers Overall transfer level: Needs assistance Equipment used: None Transfers: Sit to/from Stand Sit to Stand: Supervision         General transfer comment: supervision for safety  Ambulation/Gait Ambulation/Gait assistance: Min guard Gait Distance (Feet): 30 Feet Assistive device: None Gait Pattern/deviations: Step-through pattern;Decreased stride length;Wide base of support Gait velocity: decr Gait velocity interpretation: 1.31 - 2.62 ft/sec, indicative of limited community ambulator General Gait Details: Assist for safety. Pt reported dizziness and returned to side of bed  Stairs            Wheelchair Mobility    Modified Rankin (Stroke Patients Only)       Balance Overall balance assessment: Needs assistance Sitting-balance support: No upper extremity supported Sitting balance-Leahy Scale: Fair Sitting balance - Comments: as pt became dizzy required BUE support    Standing balance support: No upper extremity supported;During functional activity Standing balance-Leahy Scale: Fair                               Pertinent Vitals/Pain Pain Assessment: No/denies pain    Home Living Family/patient expects to be discharged to:: Private residence Living Arrangements: Alone Available Help at Discharge: Family;Available PRN/intermittently (5-6 mins away has daughters) Type of Home: House Home Access: Level entry     Home Layout: One level Home Equipment: Shower seat;Bedside commode;Walker - 4 wheels      Prior Function Level of Independence: Independent               Hand Dominance   Dominant Hand: Right    Extremity/Trunk Assessment   Upper Extremity Assessment Upper Extremity Assessment: Defer to OT evaluation    Lower Extremity Assessment Lower Extremity Assessment: Generalized weakness       Communication   Communication: No difficulties  Cognition Arousal/Alertness: Awake/alert Behavior During Therapy: WFL for tasks assessed/performed Overall Cognitive Status: Within Functional Limits for tasks assessed                                        General Comments General comments (skin integrity, edema, etc.): Pt c/o dizziness when amb and when back in bed. BP's 130's-120's/70-80's    Exercises     Assessment/Plan    PT Assessment Patient needs continued PT services  PT Problem List Decreased strength;Decreased activity  tolerance;Decreased mobility;Decreased balance;Obesity       PT Treatment Interventions DME instruction;Gait training;Functional mobility training;Therapeutic activities;Therapeutic exercise;Balance training;Patient/family education    PT Goals (Current goals can be found in the Care Plan section)  Acute Rehab PT Goals Patient Stated Goal: get rid of dizziness PT Goal Formulation: With patient Time For Goal Achievement: 08/25/20 Potential to Achieve Goals: Good     Frequency Min 3X/week   Barriers to discharge Decreased caregiver support lives alone    Co-evaluation               AM-PAC PT "6 Clicks" Mobility  Outcome Measure Help needed turning from your back to your side while in a flat bed without using bedrails?: None Help needed moving from lying on your back to sitting on the side of a flat bed without using bedrails?: None Help needed moving to and from a bed to a chair (including a wheelchair)?: A Little Help needed standing up from a chair using your arms (e.g., wheelchair or bedside chair)?: A Little Help needed to walk in hospital room?: A Little Help needed climbing 3-5 steps with a railing? : A Little 6 Click Score: 20    End of Session   Activity Tolerance: Other (comment) (Limited by dizziness) Patient left: in bed;with call bell/phone within reach;with bed alarm set Nurse Communication: Mobility status;Other (comment) (dizziness) PT Visit Diagnosis: Unsteadiness on feet (R26.81);Other abnormalities of gait and mobility (R26.89);Dizziness and giddiness (R42)    Time: 1202-1218 PT Time Calculation (min) (ACUTE ONLY): 16 min   Charges:   PT Evaluation $PT Eval Moderate Complexity: 1 [place] Pager [phone] Office [place] 08/18/2020, 1:19 PM

## 2020-08-19 DIAGNOSIS — I5033 Acute on chronic diastolic (congestive) heart failure: Secondary | ICD-10-CM | POA: Diagnosis not present

## 2020-08-19 LAB — BASIC METABOLIC PANEL
Anion gap: 6 (ref 5–15)
BUN: 20 mg/dL (ref 8–23)
CO2: 33 mmol/L — ABNORMAL HIGH (ref 22–32)
Calcium: 8.9 mg/dL (ref 8.9–10.3)
Chloride: 97 mmol/L — ABNORMAL LOW (ref 98–111)
Creatinine, Ser: 1.18 mg/dL — ABNORMAL HIGH (ref 0.44–1.00)
GFR, Estimated: 48 mL/min — ABNORMAL LOW (ref >60)
Glucose, Bld: 101 mg/dL — ABNORMAL HIGH (ref 70–99)
Potassium: 4.4 mmol/L (ref 3.5–5.1)
Sodium: 136 mmol/L (ref 135–145)

## 2020-08-19 NOTE — Progress Notes (Signed)
Physical Therapy Treatment Patient Details Name: [name] MRN: [id number] DOB: 1945/10/04 Today's Date: 08/19/2020    History of Present Illness Pt is a 75 yr old female who presented due to SOB, edema in BLE and  chest discomfort. Pt admitted for Acute on chronic diastolic CHF.   PMH but not limited to: CAD, hypothyroidism, afib    PT Comments    Pt endorses dizziness throughout session, orthostatics negative. Pt tolerates transfers and ambulation without physical assistance, but requires cues for safety with device management. Pt ambulates short household distances, becoming dizzy and requiring seated rest break. Pt declines further mobility, reporting that she feels unwell. Pt demonstrates generalized weakness, decreased awareness of safety precautions, reduced tolerance to activity, and deficits in functional mobility and gait. Pt will continue to benefit from acute PT services to improve activity tolerance and independence in mobility. SPT recommends HHPT with supervision for all OOB mobility due to safety concerns because of pt dizziness with mobility.    Follow Up Recommendations  Home health PT;Supervision for mobility/OOB     Equipment Recommendations  None recommended by PT    Recommendations for Other Services       Precautions / Restrictions Precautions Precautions: Other (comment);Fall Precaution Comments: BP, HR Restrictions Weight Bearing Restrictions: No    Mobility  Bed Mobility               General bed mobility comments: received in recliner    Transfers Overall transfer level: Needs assistance Equipment used: 4-wheeled walker Transfers: Sit to/from Stand Sit to Stand: Supervision         General transfer comment: supervision for safety, pt endorses dizziness and requires seated rest break. pt requires cues for rollator break management.  Ambulation/Gait Ambulation/Gait assistance: Min guard Gait Distance (Feet): 65 Feet Assistive  device: 4-wheeled walker Gait Pattern/deviations: Step-through pattern;Decreased stride length;Wide base of support Gait velocity: decreased Gait velocity interpretation: 1.31 - 2.62 ft/sec, indicative of limited community ambulator General Gait Details: pt requires cues for proximity to rollator. pt ambulates for 65 feet and requires seated rest break, reporting she does not feel well and declines further mobility.   Stairs             Wheelchair Mobility    Modified Rankin (Stroke Patients Only)       Balance Overall balance assessment: Needs assistance Sitting-balance support: No upper extremity supported Sitting balance-Leahy Scale: Fair     Standing balance support: During functional activity Standing balance-Leahy Scale: Fair Standing balance comment: Pt briefly tolerates static standing without reliance of UE support, requires seated rest break as she becomes dizzy.                            Cognition Arousal/Alertness: Awake/alert Behavior During Therapy: WFL for tasks assessed/performed Overall Cognitive Status: Within Functional Limits for tasks assessed                                        Exercises      General Comments General comments (skin integrity, edema, etc.): Pt BP initally 131/61 with HR 79. Pt endorses dizziness with standing and requires seated rest break immediately. Pt BP taken again in sitting, 148/94, HR 80. Pt ambulates and requires seated rest break with BP- 152/126 and repeat BP 154/96.      Pertinent Vitals/Pain Pain Assessment: No/denies  pain    Home Living                      Prior Function            PT Goals (current goals can now be found in the care plan section) Acute Rehab PT Goals Patient Stated Goal: get rid of dizziness Progress towards PT goals: Progressing toward goals    Frequency    Min 3X/week      PT Plan Current plan remains appropriate    Co-evaluation               AM-PAC PT "6 Clicks" Mobility   Outcome Measure  Help needed turning from your back to your side while in a flat bed without using bedrails?: None Help needed moving from lying on your back to sitting on the side of a flat bed without using bedrails?: None Help needed moving to and from a bed to a chair (including a wheelchair)?: A Little Help needed standing up from a chair using your arms (e.g., wheelchair or bedside chair)?: A Little Help needed to walk in hospital room?: A Little Help needed climbing 3-5 steps with a railing? : A Little 6 Click Score: 20    End of Session Equipment Utilized During Treatment: Gait belt Activity Tolerance: Other (comment) (dizziness) Patient left: in chair;with call bell/phone within reach;with chair alarm set;with nursing/sitter in room Nurse Communication: Mobility status PT Visit Diagnosis: Unsteadiness on feet (R26.81);Other abnormalities of gait and mobility (R26.89);Dizziness and giddiness (R42)     Time: 2197-5883 PT Time Calculation (min) (ACUTE ONLY): 24 min  Charges:  $Gait Training: 8-22 mins $Therapeutic Activity: 8-22 mins                     Acute Rehab  Pager: [phone]    Garwin Brothers, SPT  08/19/2020, 11:50 AM

## 2020-08-19 NOTE — Progress Notes (Signed)
PROGRESS NOTE    [name]  CHE:[id number] DOB: Nov 26, 1945 DOA: 08/15/2020 PCP: Elisabeth Cara, PA-C    Chief Complaint  Patient presents with   Congestive Heart Failure    Brief Narrative:   coronary artery disease, hypothyroidism, atrial fibrillation on warfarin, and BMI 31, now presenting to the emergency department for evaluation of shortness of breath,Chest x-ray showed increased vascular congestion and interstitial edema. Patient was given 80 mg IV Lasix in the ED and transferred to [facility] for ongoing evaluation and management.  Subjective:  She feels dizzy and weak, do not feel like can go home today, she denies pain  Assessment & Plan:   Principal Problem:   Acute on chronic diastolic CHF (congestive heart failure) (HCC) Active Problems:   Persistent atrial fibrillation (HCC)   CAD (coronary artery disease)   Hypothyroidism   Renal insufficiency   Dizziness  Will check orthostatic vital signs  Acute on chronic diastolic CHF -Was on IV Lasix, changed to oral Lasix -Diuresed well -Lung clears, no edema on exam  CAD High-sensitivity troponin negative Continue Imdur metoprolol She is on Coumadin I do not see cholesterol medication on her home med list  Chronic A. Fib In A. Fib, rate controlled Continue metoprolol and Coumadin INR is 2  Hypothyroidism Continue Synthroid  Class III obesity: Body mass index is 45.79 kg/m.Marland Kitchen     Unresulted Labs (From admission, onward)     Start     Ordered   08/21/20 0500  Protime-INR  Every Friday,   R     Question:  Specimen collection method  Answer:  Lab=Lab collect   08/18/20 1008   08/16/20 5361  Basic metabolic panel  Daily,   R     Question:  Specimen collection method  Answer:  Lab=Lab collect   08/16/20 0736              DVT prophylaxis:  warfarin (COUMADIN) tablet 5 mg  warfarin (COUMADIN) tablet 7.5 mg   Code Status: Full, will verify CODE STATUS with patient,  there are DNR and full code documented in the past Family Communication: Patient Disposition:   Status is: Inpatient   Dispo: The patient is from: home              Anticipated d/c is to: home health              Anticipated d/c date is: Tomorrow                Consultants:  None  Procedures:  None  Antimicrobials:   Anti-infectives (From admission, onward)    None           Objective: Vitals:   08/18/20 0624 08/18/20 1351 08/18/20 1956 08/19/20 0531  BP: 135/87 104/67 113/60 (!) 158/101  Pulse: 77 83 83 68  Resp: 17 20  20   Temp: 97.8 F (36.6 C) 97.8 F (36.6 C) 97.8 F (36.6 C) 97.8 F (36.6 C)  TempSrc: Oral Oral Oral Oral  SpO2: 90% 93% 93% 93%  Weight: 120.7 kg   121 kg  Height:        Intake/Output Summary (Last 24 hours) at 08/19/2020 0917 Last data filed at 08/19/2020 0600 Gross per 24 hour  Intake 360 ml  Output 3050 ml  Net -2690 ml   Filed Weights   08/17/20 0520 08/18/20 0624 08/19/20 0531  Weight: 120.4 kg 120.7 kg 121 kg    Examination:  General exam: calm, NAD Respiratory  system: Clear to auscultation. Respiratory effort normal. Cardiovascular system: S1 & S2 heard, IRRR.  No pedal edema. Gastrointestinal system: Abdomen is nondistended, soft and nontender. Normal bowel sounds heard. Central nervous system: Alert and oriented. No focal neurological deficits. Extremities: No edema Skin: No rashes, lesions or ulcers Psychiatry: Judgement and insight appear normal. Mood & affect appropriate.     Data Reviewed: I have personally reviewed following labs and imaging studies  CBC: Recent Labs  Lab 08/15/20 2209 08/16/20 0630 08/17/20 0134  WBC 8.3 9.3 9.5  HGB 14.4 14.7 14.6  HCT 44.6 44.3 43.9  MCV 92.1 91.2 90.3  PLT 467* 439* 470*    Basic Metabolic Panel: Recent Labs  Lab 08/15/20 2209 08/16/20 0630 08/17/20 0134 08/18/20 0145 08/19/20 0138  NA 139 139 138 136 136  K 3.9 3.4* 3.4* 3.8 4.4  CL 101 99 98 97* 97*   CO2 31 29 30  33* 33*  GLUCOSE 110* 106* 93 104* 101*  BUN 13 11 13 14 20   CREATININE 1.29* 0.99 1.05* 1.11* 1.18*  CALCIUM 8.4* 8.5* 8.5* 8.8* 8.9  MG  --   --   --  2.0  --     GFR: Estimated Creatinine Clearance: 53.6 mL/min (A) (by C-G formula based on SCr of 1.18 mg/dL (H)).  Liver Function Tests: No results for input(s): AST, ALT, ALKPHOS, BILITOT, PROT, ALBUMIN in the last 168 hours.  CBG: No results for input(s): GLUCAP in the last 168 hours.   Recent Results (from the past 240 hour(s))  Resp Panel by RT-PCR (Flu A&B, Covid) Nasopharyngeal Swab     Status: None   Collection Time: 08/15/20 10:48 PM   Specimen: Nasopharyngeal Swab; Nasopharyngeal(NP) swabs in vial transport medium  Result Value Ref Range Status   SARS Coronavirus 2 by RT PCR NEGATIVE NEGATIVE Final    Comment: (NOTE) SARS-CoV-2 target nucleic acids are NOT DETECTED.  The SARS-CoV-2 RNA is generally detectable in upper respiratory specimens during the acute phase of infection. The lowest concentration of SARS-CoV-2 viral copies this assay can detect is 138 copies/mL. A negative result does not preclude SARS-Cov-2 infection and should not be used as the sole basis for treatment or other patient management decisions. A negative result may occur with  improper specimen collection/handling, submission of specimen other than nasopharyngeal swab, presence of viral mutation(s) within the areas targeted by this assay, and inadequate number of viral copies(<138 copies/mL). A negative result must be combined with clinical observations, patient history, and epidemiological information. The expected result is Negative.  Fact Sheet for Patients:  EntrepreneurPulse.com.au  Fact Sheet for Healthcare Providers:  IncredibleEmployment.be  This test is no t yet approved or cleared by the [place] FDA and  has been authorized for detection and/or diagnosis of SARS-CoV-2 by FDA  under an Emergency Use Authorization (EUA). This EUA will remain  in effect (meaning this test can be used) for the duration of the COVID-19 declaration under Section 564(b)(1) of the Act, 21 U.S.C.section 360bbb-3(b)(1), unless the authorization is terminated  or revoked sooner.       Influenza A by PCR NEGATIVE NEGATIVE Final   Influenza B by PCR NEGATIVE NEGATIVE Final    Comment: (NOTE) The Xpert Xpress SARS-CoV-2/FLU/RSV plus assay is intended as an aid in the diagnosis of influenza from Nasopharyngeal swab specimens and should not be used as a sole basis for treatment. Nasal washings and aspirates are unacceptable for Xpert Xpress SARS-CoV-2/FLU/RSV testing.  Fact Sheet for Patients: EntrepreneurPulse.com.au  Fact  Sheet for Healthcare Providers: IncredibleEmployment.be  This test is not yet approved or cleared by the [place] and has been authorized for detection and/or diagnosis of SARS-CoV-2 by FDA under an Emergency Use Authorization (EUA). This EUA will remain in effect (meaning this test can be used) for the duration of the COVID-19 declaration under Section 564(b)(1) of the Act, 21 U.S.C. section 360bbb-3(b)(1), unless the authorization is terminated or revoked.  Performed at [facility], [address] [place], [place] [address]          Radiology Studies: ECHOCARDIOGRAM COMPLETE  Result Date: 08/17/2020    ECHOCARDIOGRAM REPORT   Patient Name:   [name] Date of Exam: 08/17/2020 Medical Rec #:  [id number]          Height:       64.0 in Accession #:    [id number]         Weight:       265.4 lb Date of Birth:  10/18/1945          BSA:          2.206 m Patient Age:    75 years           BP:           128/88 mmHg Patient Gender: F                  HR:           76 bpm. Exam Location:  Inpatient Procedure: 2D Echo, Cardiac Doppler and Color Doppler Indications:    I50.40* Unspecified combined systolic  (congestive) and diastolic                 (congestive) heart failure  History:        Patient has prior history of Echocardiogram examinations, most                 recent 08/23/2018. CHF, CAD, Abnormal ECG, Arrythmias:Atrial                 Fibrillation, Signs/Symptoms:Dyspnea; Risk Factors:Hypertension                 and Dyslipidemia.  Sonographer:    Roseanna Rainbow RDCS Referring Phys: [id number] TIMOTHY S OPYD  Sonographer Comments: Technically difficult study due to poor echo windows and patient is morbidly obese. Image acquisition challenging due to patient body habitus. IMPRESSIONS  1. Left ventricular ejection fraction, by estimation, is 50 to 55%. The left ventricle has low normal function. The left ventricle has no regional wall motion abnormalities. There is moderate left ventricular hypertrophy. Left ventricular diastolic parameters are consistent with Grade I diastolic dysfunction (impaired relaxation).  2. Right ventricular systolic function is mildly reduced. The right ventricular size is mildly enlarged.  3. The mitral valve is normal in structure. No evidence of mitral valve regurgitation. No evidence of mitral stenosis.  4. The aortic valve is normal in structure. Aortic valve regurgitation is trivial. Mild to moderate aortic valve sclerosis/calcification is present, without any evidence of aortic stenosis.  5. The inferior vena cava is normal in size with greater than 50% respiratory variability, suggesting right atrial pressure of 3 mmHg. FINDINGS  Left Ventricle: Left ventricular ejection fraction, by estimation, is 50 to 55%. The left ventricle has low normal function. The left ventricle has no regional wall motion abnormalities. The left ventricular internal cavity size was normal in size. There is moderate left ventricular hypertrophy. Left ventricular  diastolic parameters are consistent with Grade I diastolic dysfunction (impaired relaxation). Right Ventricle: The right ventricular size is mildly  enlarged. No increase in right ventricular wall thickness. Right ventricular systolic function is mildly reduced. Left Atrium: Left atrial size was normal in size. Right Atrium: Right atrial size was normal in size. Pericardium: There is no evidence of pericardial effusion. Mitral Valve: The mitral valve is normal in structure. No evidence of mitral valve regurgitation. No evidence of mitral valve stenosis. Tricuspid Valve: The tricuspid valve is normal in structure. Tricuspid valve regurgitation is mild . No evidence of tricuspid stenosis. Aortic Valve: The aortic valve is normal in structure. Aortic valve regurgitation is trivial. Mild to moderate aortic valve sclerosis/calcification is present, without any evidence of aortic stenosis. Pulmonic Valve: The pulmonic valve was normal in structure. Pulmonic valve regurgitation is trivial. No evidence of pulmonic stenosis. Aorta: The aortic root is normal in size and structure. Venous: The inferior vena cava is normal in size with greater than 50% respiratory variability, suggesting right atrial pressure of 3 mmHg. IAS/Shunts: No atrial level shunt detected by color flow Doppler.  LEFT VENTRICLE PLAX 2D LVIDd:         3.50 cm LVIDs:         2.90 cm LV PW:         1.70 cm LV IVS:        1.30 cm LVOT diam:     1.80 cm LV SV:         53 LV SV Index:   24 LVOT Area:     2.54 cm  LV Volumes (MOD) LV vol d, MOD A2C: 59.1 ml LV vol d, MOD A4C: 77.9 ml LV vol s, MOD A2C: 29.8 ml LV vol s, MOD A4C: 41.0 ml LV SV MOD A2C:     29.3 ml LV SV MOD A4C:     77.9 ml LV SV MOD BP:      35.7 ml IVC IVC diam: 2.30 cm LEFT ATRIUM           Index       RIGHT ATRIUM           Index LA diam:      3.30 cm 1.50 cm/m  RA Area:     20.10 cm LA Vol (A2C): 26.4 ml 11.96 ml/m RA Volume:   62.90 ml  28.51 ml/m LA Vol (A4C): 52.9 ml 23.98 ml/m  AORTIC VALVE LVOT Vmax:   107.00 cm/s LVOT Vmean:  80.400 cm/s LVOT VTI:    0.207 m  AORTA Ao Root diam: 3.30 cm Ao Asc diam:  3.20 cm MITRAL VALVE MV  Area (PHT): 3.86 cm    SHUNTS MV Decel Time: 197 msec    Systemic VTI:  0.21 m MV E velocity: 72.77 cm/s  Systemic Diam: 1.80 cm [name] MD Electronically signed by [name] MD Signature Date/Time: 08/17/2020/1:22:17 PM    Final         Scheduled Meds:  furosemide  40 mg Oral Daily   isosorbide mononitrate  30 mg Oral Daily   levothyroxine  50 mcg Oral QAC breakfast   metoprolol succinate  25 mg Oral Daily   sodium chloride flush  3 mL Intravenous Q12H   sotalol  120 mg Oral BID   warfarin  5 mg Oral Once per day on Sun Mon Wed Thu Sat   warfarin  7.5 mg Oral Once per day on Tue Fri   Warfarin - Pharmacist Dosing  Inpatient   Does not apply q1600   Continuous Infusions:  sodium chloride       LOS: 3 days   Time spent: 25 mins Greater than 50% of this time was spent in counseling, explanation of diagnosis, planning of further management, and coordination of care.   Voice Recognition Viviann Spare dictation system was used to create this note, attempts have been made to correct errors. Please contact the author with questions and/or clarifications.   Florencia Reasons, MD PhD FACP [facility]  Available via Epic secure chat 7am-7pm for nonurgent issues Please page for urgent issues To page the attending provider between 7A-7P or the covering provider during after hours 7P-7A, please log into the web site [email or web address] and access using universal [place] password for that web site. If you do not have the password, please call the hospital operator.    08/19/2020, 9:17 AM

## 2020-08-19 NOTE — TOC Initial Note (Addendum)
Transition of Care [facility]) - Initial/Assessment Note    Patient Details  Name: [name] MRN: [id number] Date of Birth: May 11, 1945  Transition of Care [facility]) CM/SW Contact:    Bethena Roys, RN Phone Number: 08/19/2020, 3:55 PM  Clinical Narrative: Case Manager spoke with patient and she is agreeable to home health services, Physical Therapy, Aide and Registered Nurse. Medicare.gov list provided to this patient and she did not have a preference for agency. Case Manager called [facility] and they will accept the patient for services. Start of care to begin within 24-48 hours post transition home. Patient states she lives in a single family home alone; however, her grandson will be staying with her for a while and her daughter will check in. Patient does not have issues getting to appointments or getting medications. Patient states she has a rolling walker at home and will not need other durable medical equipment. Daphne RN Tourist information centre manager, provided heart failure education to the patient. Packet provided and information discussed. Patient will be able to transition home in a private vehicle. Patient will need HH PT, RN, and Aide orders. No further needs identified at this time.                    Expected Discharge Plan: [place] Barriers to Discharge: No Barriers Identified   Patient Goals and CMS Choice Patient states their goals for this hospitalization and ongoing recovery are:: to return home CMS Medicare.gov Compare Post Acute Care list provided to:: Patient Choice offered to / list presented to : Patient  Expected Discharge Plan and Services Expected Discharge Plan: [place] In-house Referral: NA Discharge Planning Services: CM Consult Post Acute Care Choice: [place] arrangements for the past 2 months: Single Family Home Expected Discharge Date: 08/19/20               DME Arranged: N/A DME Agency: NA       HH  Arranged: RN, Disease Management, PT, Nurse's Aide [place] Agency: [place] Date [facility] Agency Contacted: 08/19/20 Time HH Agency Contacted: 38 Representative spoke with at [place]: Tommi Rumps  Prior Living Arrangements/Services Living arrangements for the past 2 months: [place] with:: Self Patient language and need for interpreter reviewed:: Yes Do you feel safe going back to the place where you live?: Yes      Need for Family Participation in Patient Care: Yes (Comment) Care giver support system in place?: Yes (comment) Current home services: DME (Patient has a rolling walker at home.) Criminal Activity/Legal Involvement Pertinent to Current Situation/Hospitalization: No - Comment as needed  Activities of Daily Living Home Assistive Devices/Equipment: None ADL Screening (condition at time of admission) Patient's cognitive ability adequate to safely complete daily activities?: Yes Is the patient deaf or have difficulty hearing?: No Does the patient have difficulty seeing, even when wearing glasses/contacts?: No Does the patient have difficulty concentrating, remembering, or making decisions?: No Patient able to express need for assistance with ADLs?: Yes Does the patient have difficulty dressing or bathing?: No Independently performs ADLs?: Yes (appropriate for developmental age) Does the patient have difficulty walking or climbing stairs?: No Weakness of Legs: None Weakness of Arms/Hands: None  Permission Sought/Granted Permission sought to share information with : Family Supports, Customer service manager, Case Optician, dispensing granted to share information with : Yes, Verbal Permission Granted     Permission granted to share info w AGENCY: [facility]  Emotional Assessment Appearance:: Appears stated age Attitude/Demeanor/Rapport: Engaged Affect (typically observed): Appropriate Orientation: : Oriented to Situation, Oriented to  Time,  Oriented to Place, Oriented to Self Alcohol / Substance Use: Not Applicable Psych Involvement: No (comment)  Admission diagnosis:  Acute CHF (congestive heart failure) (HCC) [I50.9] Acute on chronic congestive heart failure, unspecified heart failure type [facility]) [I50.9] Patient Active Problem List   Diagnosis Date Noted   Renal insufficiency 08/16/2020   Acute on chronic diastolic CHF (congestive heart failure) ([place]) 08/15/2020   Cough 10/16/2017   History of splenectomy 07/20/2017   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   Mycoplasma pneumonia 07/06/2017   Postural dizziness with presyncope    Bronchitis 07/05/2017   Chronic diastolic CHF 55/01/5866   Hyperglycemia 09/23/2016   Age-related osteoporosis without current pathological fracture 08/27/2015   Vitamin D deficiency 08/27/2015   Hyperlipidemia 08/06/2015   Persistent atrial fibrillation ([place]) 04/18/2015   CAD (coronary artery disease) 04/18/2015   Cardiomyopathy, ischemic 04/18/2015   PAF (paroxysmal atrial fibrillation) ([place]) 04/18/2015   Hypertensive heart disease with heart failure ([place]) 04/15/2015   Anxiety 04/15/2015   Encounter for therapeutic drug monitoring 01/21/2015   Dyspnea on exertion 01/05/2015   Chronic anticoagulation 05/05/2014   Primary insomnia 05/05/2014   Long-term use of high-risk medication 02/05/2014   Hypothyroidism 06/14/2013   Localized osteoporosis without current pathological fracture 06/14/2013   Low back pain 06/14/2013   Morbid obesity ([place]) 01/07/2013   Polypharmacy 01/07/2013   Benign essential HTN 08/01/2011   Atrial fibrillation ([place]) 08/01/2011   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

## 2020-08-20 DIAGNOSIS — I5033 Acute on chronic diastolic (congestive) heart failure: Secondary | ICD-10-CM | POA: Diagnosis not present

## 2020-08-20 LAB — BASIC METABOLIC PANEL
Anion gap: 11 (ref 5–15)
BUN: 21 mg/dL (ref 8–23)
CO2: 28 mmol/L (ref 22–32)
Calcium: 8.9 mg/dL (ref 8.9–10.3)
Chloride: 98 mmol/L (ref 98–111)
Creatinine, Ser: 1 mg/dL (ref 0.44–1.00)
GFR, Estimated: 59 mL/min — ABNORMAL LOW (ref >60)
Glucose, Bld: 86 mg/dL (ref 70–99)
Potassium: 4.6 mmol/L (ref 3.5–5.1)
Sodium: 137 mmol/L (ref 135–145)

## 2020-08-20 NOTE — Discharge Summary (Signed)
Discharge Summary  [name]:[id number] DOB: 15-Dec-1945  PCP: Elisabeth Cara, PA-C  Admit date: 08/15/2020 Discharge date: 08/20/2020  Time spent: 42mins, more than 50% time spent on coordination of care.   Recommendations for Outpatient Follow-up:  F/u with PCP within a week  for hospital discharge follow up, repeat cbc/bmp at follow up Follow-up with cardiology on August 5 Follow-up with Coumadin clinic Home health arranged    Discharge Diagnoses:  Active Hospital Problems   Diagnosis Date Noted   Acute on chronic diastolic CHF (congestive heart failure) ([place]) 08/15/2020   Renal insufficiency 08/16/2020   Persistent atrial fibrillation ([place]) 04/18/2015   CAD (coronary artery disease) 04/18/2015   Hypothyroidism 06/14/2013    Resolved Hospital Problems  No resolved problems to display.    Discharge Condition: stable  Diet recommendation: heart healthy  Filed Weights   08/18/20 0624 08/19/20 0531 08/20/20 0357  Weight: 120.7 kg 121 kg 121.2 kg    History of present illness: (Per admitting MD Dr. Myna Hidalgo ) Chief Complaint: SOB, orthopnea, chest discomfort    HPI: [name] is a 75 y.o. female with medical history significant for coronary artery disease, hypothyroidism, atrial fibrillation on warfarin, and BMI 46, now presenting to the emergency department for evaluation of shortness of breath, orthopnea, and chest discomfort.  Patient reports that she has been dealing with increased shortness of breath for approximately 2 months but then went on to develop orthopnea over the past 3 or 4 days and had an episode of chest discomfort while trying to rest in a recliner 2 nights ago.  She has not noticed any increased leg swelling, denies significant cough, and denies fever or chills.  She has had to sleep in a recliner due to orthopnea recently.  Her chest pain episode resolved after approximately 10 minutes, was localized to the central chest, and while  she took 1 dose of nitroglycerin, she is not sure that that is what led to resolution.   [facility] ED Course: Upon arrival to the ED, patient is found to be afebrile, saturating low 90s on room air, mildly tachypneic, and with stable blood pressure.  EKG features atrial fibrillation.  Chest x-ray with increased vascular congestion and interstitial edema.  Chemistry panel notable for creatinine 1.29.  CBC with mild thrombocytosis.  INR is 2.1.  HS troponin is 5 twice.  BNP 869.  Patient was given 80 mg IV Lasix in the ED and transferred to [facility] for ongoing evaluation and management.    Hospital Course:  Principal Problem:   Acute on chronic diastolic CHF (congestive heart failure) (HCC) Active Problems:   Persistent atrial fibrillation (HCC)   CAD (coronary artery disease)   Hypothyroidism   Renal insufficiency  Acute on chronic diastolic CHF -Was on IV Lasix, changed to oral Lasix -Diuresed well -Lung clears, no edema on exam, she is feeling much better today, she desires to go home today   CAD High-sensitivity troponin negative Continue Imdur ,metoprolol She is on Coumadin I do not see cholesterol medication on her home med list She denies chest pain  She is to follow up with cardiology   Chronic A. Fib In A. Fib, rate controlled Continue metoprolol and Coumadin INR is 2  CKD 3a Renal function appear close to baseline   Hypothyroidism Continue Synthroid   Class III obesity: Body mass index is 45.79 kg/m.Marland Kitchen    Procedures: None  Consultations: None  Discharge Exam: BP 113/62 (BP Location: Left Arm)  Pulse 77   Temp 97.6 F (36.4 C) (Oral)   Resp 18   Ht 5\' 4"  (1.626 m)   Wt 121.2 kg   SpO2 100%   BMI 45.88 kg/m   General: NAD, pleasant, AAOx3 Cardiovascular: RRR Respiratory: Clear to auscultation bilaterally, no edema  Discharge Instructions You were cared for by a hospitalist during your hospital stay. If you have any questions about your  discharge medications or the care you received while you were in the hospital after you are discharged, you can call the unit and asked to speak with the hospitalist on call if the hospitalist that took care of you is not available. Once you are discharged, your primary care physician will handle any further medical issues. Please note that NO REFILLS for any discharge medications will be authorized once you are discharged, as it is imperative that you return to your primary care physician (or establish a relationship with a primary care physician if you do not have one) for your aftercare needs so that they can reassess your need for medications and monitor your lab values.  Discharge Instructions     Diet - low sodium heart healthy   Complete by: As directed    Increase activity slowly   Complete by: As directed    Increase activity slowly   Complete by: As directed       Allergies as of 08/20/2020       Reactions   Ceftriaxone Rash, Other (See Comments)   "TTP"/Was hospitalized and in a coma for 60 days, per patient (Thrombotic Thrombocytopenic Purpura)   Penicillins Hives, Rash, Other (See Comments)   Cannot tolerate any "-CILLINS"; causes welts!! Has patient had a PCN reaction causing immediate rash, facial/tongue/throat swelling, SOB or lightheadedness with hypotension: Yes Has patient had a PCN reaction causing severe rash involving mucus membranes or skin necrosis: No Has patient had a PCN reaction that required hospitalization No Has patient had a PCN reaction occurring within the last 10 years: No If all of the above answers are "NO", then may proceed with Cephalosporin use.   Levofloxacin Other (See Comments)   Thrombocytopenia    Levofloxacin Other (See Comments)   Dropped blood platelets extremely low   Oxycodone Itching   Tape Other (See Comments)   Prefers paper or cloth tape Other reaction(s): Other (See Comments) Prefers paper or cloth tape Other reaction(s): Other  (See Comments) Prefers paper or cloth tape   Alendronate Rash   Aspirin Other (See Comments)   Patient stated she is unable to take due to blood thinner   Oxycodone-acetaminophen Itching   Penicillin G Rash        Medication List     TAKE these medications    furosemide 40 MG tablet Commonly known as: LASIX Take 40 mg by mouth daily.   isosorbide mononitrate 30 MG 24 hr tablet Commonly known as: IMDUR TAKE 1 TABLET BY MOUTH EVERY DAY   levothyroxine 50 MCG tablet Commonly known as: SYNTHROID Take 50 mcg by mouth daily before breakfast.   metoprolol succinate 25 MG 24 hr tablet Commonly known as: TOPROL-XL TAKE 1 TABLET (25 MG TOTAL) BY MOUTH DAILY. TAKE WITH OR IMMEDIATELY FOLLOWING A MEAL.   nitroGLYCERIN 0.4 MG SL tablet Commonly known as: NITROSTAT PLACE 1 TABLET (0.4 MG TOTAL) UNDER THE TONGUE EVERY 5 (FIVE) MINUTES AS NEEDED FOR CHEST PAIN.   potassium chloride 10 MEQ tablet Commonly known as: KLOR-CON Take 10 mEq by mouth daily.   sotalol  120 MG tablet Commonly known as: BETAPACE TAKE 1 TABLET (120 MG TOTAL) BY MOUTH 2 (TWO) TIMES DAILY.   Vitamin D3 50 MCG (2000 UT) capsule Take 2,000 Units by mouth every morning.   warfarin 5 MG tablet Commonly known as: COUMADIN Take as directed. If you are unsure how to take this medication, talk to your nurse or doctor. Original instructions: TAKE AS DIRECTED BY ANTICOAGULATION CLINIC What changed: See the new instructions.       Allergies  Allergen Reactions   Ceftriaxone Rash and Other (See Comments)    "TTP"/Was hospitalized and in a coma for 60 days, per patient (Thrombotic Thrombocytopenic Purpura)   Penicillins Hives, Rash and Other (See Comments)    Cannot tolerate any "-CILLINS"; causes welts!! Has patient had a PCN reaction causing immediate rash, facial/tongue/throat swelling, SOB or lightheadedness with hypotension: Yes Has patient had a PCN reaction causing severe rash involving mucus membranes or  skin necrosis: No Has patient had a PCN reaction that required hospitalization No Has patient had a PCN reaction occurring within the last 10 years: No If all of the above answers are "NO", then may proceed with Cephalosporin use.   Levofloxacin Other (See Comments)    Thrombocytopenia     Levofloxacin Other (See Comments)    Dropped blood platelets extremely low   Oxycodone Itching   Tape Other (See Comments)    Prefers paper or cloth tape Other reaction(s): Other (See Comments) Prefers paper or cloth tape Other reaction(s): Other (See Comments) Prefers paper or cloth tape   Alendronate Rash   Aspirin Other (See Comments)    Patient stated she is unable to take due to blood thinner    Oxycodone-Acetaminophen Itching   Penicillin G Rash    Follow-up Information     Napan[place], Antigo, PA-C Follow up in 1 week(s).   Specialty: Family Medicine Why: hospital discharge follow up, repeat lab work /monitor kidney function Contact information: [address] [place] [place] [address] [phone]         Jerline Pain, MD Follow up in 2 week(s).   Specialty: Cardiology Why: for heart failure management Contact information: [address] [address] [phone]         Constance Haw, MD .   Specialty: Cardiology Contact information: [place] [address] [phone]         [facility], [facility] Follow up.   Specialty: Home Health Services Why: Registered Nurse, Physical Therapy, Aide-office to call with visit times. Contact information: [place] [address] [phone]                  The results of significant diagnostics from this hospitalization (including imaging, microbiology, ancillary and laboratory) are listed below for reference.    Significant Diagnostic Studies: DG Chest 2 View  Result Date: 08/15/2020 CLINICAL DATA:  Increasing  shortness of breath for 2 months, orthopnea EXAM: CHEST - 2 VIEW COMPARISON:  11/28/2017 FINDINGS: Frontal and lateral views of the chest demonstrate mild enlargement the cardiac silhouette. There is increased central vascular prominence with diffuse interstitial opacities throughout the lungs. Patchy bilateral infrahilar ground-glass airspace disease identified. No effusion or pneumothorax. No acute bony abnormalities. IMPRESSION: 1. Findings consistent with mild fluid overload and interstitial edema. Electronically Signed   By: Randa Ngo M.D.   On: 08/15/2020 22:26   ECHOCARDIOGRAM COMPLETE  Result Date: 08/17/2020  ECHOCARDIOGRAM REPORT   Patient Name:   [name] Mey Date of Exam: 08/17/2020 Medical Rec #:  [id number]          Height:       64.0 in Accession #:    [id number]         Weight:       265.4 lb Date of Birth:  [birth year]          BSA:          2.206 m Patient Age:    27 years           BP:           128/88 mmHg Patient Gender: F                  HR:           76 bpm. Exam Location:  Inpatient Procedure: 2D Echo, Cardiac Doppler and Color Doppler Indications:    I50.40* Unspecified combined systolic (congestive) and diastolic                 (congestive) heart failure  History:        Patient has prior history of Echocardiogram examinations, most                 recent 08/23/2018. CHF, CAD, Abnormal ECG, Arrythmias:Atrial                 Fibrillation, Signs/Symptoms:Dyspnea; Risk Factors:Hypertension                 and Dyslipidemia.  Sonographer:    Roseanna Rainbow RDCS Referring Phys: [id number] TIMOTHY S OPYD  Sonographer Comments: Technically difficult study due to poor echo windows and patient is morbidly obese. Image acquisition challenging due to patient body habitus. IMPRESSIONS  1. Left ventricular ejection fraction, by estimation, is 50 to 55%. The left ventricle has low normal function. The left ventricle has no regional wall motion abnormalities. There is moderate left ventricular  hypertrophy. Left ventricular diastolic parameters are consistent with Grade I diastolic dysfunction (impaired relaxation).  2. Right ventricular systolic function is mildly reduced. The right ventricular size is mildly enlarged.  3. The mitral valve is normal in structure. No evidence of mitral valve regurgitation. No evidence of mitral stenosis.  4. The aortic valve is normal in structure. Aortic valve regurgitation is trivial. Mild to moderate aortic valve sclerosis/calcification is present, without any evidence of aortic stenosis.  5. The inferior vena cava is normal in size with greater than 50% respiratory variability, suggesting right atrial pressure of 3 mmHg. FINDINGS  Left Ventricle: Left ventricular ejection fraction, by estimation, is 50 to 55%. The left ventricle has low normal function. The left ventricle has no regional wall motion abnormalities. The left ventricular internal cavity size was normal in size. There is moderate left ventricular hypertrophy. Left ventricular diastolic parameters are consistent with Grade I diastolic dysfunction (impaired relaxation). Right Ventricle: The right ventricular size is mildly enlarged. No increase in right ventricular wall thickness. Right ventricular systolic function is mildly reduced. Left Atrium: Left atrial size was normal in size. Right Atrium: Right atrial size was normal in size. Pericardium: There is no evidence of pericardial effusion. Mitral Valve: The mitral valve is normal in structure. No evidence of mitral valve regurgitation. No evidence of mitral valve stenosis. Tricuspid Valve: The tricuspid valve is normal in structure. Tricuspid valve regurgitation is mild . No evidence of tricuspid stenosis. Aortic Valve: The aortic valve is normal  in structure. Aortic valve regurgitation is trivial. Mild to moderate aortic valve sclerosis/calcification is present, without any evidence of aortic stenosis. Pulmonic Valve: The pulmonic valve was normal in  structure. Pulmonic valve regurgitation is trivial. No evidence of pulmonic stenosis. Aorta: The aortic root is normal in size and structure. Venous: The inferior vena cava is normal in size with greater than 50% respiratory variability, suggesting right atrial pressure of 3 mmHg. IAS/Shunts: No atrial level shunt detected by color flow Doppler.  LEFT VENTRICLE PLAX 2D LVIDd:         3.50 cm LVIDs:         2.90 cm LV PW:         1.70 cm LV IVS:        1.30 cm LVOT diam:     1.80 cm LV SV:         53 LV SV Index:   24 LVOT Area:     2.54 cm  LV Volumes (MOD) LV vol d, MOD A2C: 59.1 ml LV vol d, MOD A4C: 77.9 ml LV vol s, MOD A2C: 29.8 ml LV vol s, MOD A4C: 41.0 ml LV SV MOD A2C:     29.3 ml LV SV MOD A4C:     77.9 ml LV SV MOD BP:      35.7 ml IVC IVC diam: 2.30 cm LEFT ATRIUM           Index       RIGHT ATRIUM           Index LA diam:      3.30 cm 1.50 cm/m  RA Area:     20.10 cm LA Vol (A2C): 26.4 ml 11.96 ml/m RA Volume:   62.90 ml  28.51 ml/m LA Vol (A4C): 52.9 ml 23.98 ml/m  AORTIC VALVE LVOT Vmax:   107.00 cm/s LVOT Vmean:  80.400 cm/s LVOT VTI:    0.207 m  AORTA Ao Root diam: 3.30 cm Ao Asc diam:  3.20 cm MITRAL VALVE MV Area (PHT): 3.86 cm    SHUNTS MV Decel Time: 197 msec    Systemic VTI:  0.21 m MV E velocity: 72.77 cm/s  Systemic Diam: 1.80 cm [name] MD Electronically signed by [name] MD Signature Date/Time: 08/17/2020/1:22:17 PM    Final     Microbiology: Recent Results (from the past 240 hour(s))  Resp Panel by RT-PCR (Flu A&B, Covid) Nasopharyngeal Swab     Status: None   Collection Time: 08/15/20 10:48 PM   Specimen: Nasopharyngeal Swab; Nasopharyngeal(NP) swabs in vial transport medium  Result Value Ref Range Status   SARS Coronavirus 2 by RT PCR NEGATIVE NEGATIVE Final    Comment: (NOTE) SARS-CoV-2 target nucleic acids are NOT DETECTED.  The SARS-CoV-2 RNA is generally detectable in upper respiratory specimens during the acute phase of infection. The lowest concentration  of SARS-CoV-2 viral copies this assay can detect is 138 copies/mL. A negative result does not preclude SARS-Cov-2 infection and should not be used as the sole basis for treatment or other patient management decisions. A negative result may occur with  improper specimen collection/handling, submission of specimen other than nasopharyngeal swab, presence of viral mutation(s) within the areas targeted by this assay, and inadequate number of viral copies(<138 copies/mL). A negative result must be combined with clinical observations, patient history, and epidemiological information. The expected result is Negative.  Fact Sheet for Patients:  EntrepreneurPulse.com.au  Fact Sheet for Healthcare Providers:  IncredibleEmployment.be  This test is no t  yet approved or cleared by the [place] and  has been authorized for detection and/or diagnosis of SARS-CoV-2 by FDA under an Emergency Use Authorization (EUA). This EUA will remain  in effect (meaning this test can be used) for the duration of the COVID-19 declaration under Section 564(b)(1) of the Act, 21 U.S.C.section 360bbb-3(b)(1), unless the authorization is terminated  or revoked sooner.       Influenza A by PCR NEGATIVE NEGATIVE Final   Influenza B by PCR NEGATIVE NEGATIVE Final    Comment: (NOTE) The Xpert Xpress SARS-CoV-2/FLU/RSV plus assay is intended as an aid in the diagnosis of influenza from Nasopharyngeal swab specimens and should not be used as a sole basis for treatment. Nasal washings and aspirates are unacceptable for Xpert Xpress SARS-CoV-2/FLU/RSV testing.  Fact Sheet for Patients: EntrepreneurPulse.com.au  Fact Sheet for Healthcare Providers: IncredibleEmployment.be  This test is not yet approved or cleared by the [place] FDA and has been authorized for detection and/or diagnosis of SARS-CoV-2 by FDA under an Emergency Use  Authorization (EUA). This EUA will remain in effect (meaning this test can be used) for the duration of the COVID-19 declaration under Section 564(b)(1) of the Act, 21 U.S.C. section 360bbb-3(b)(1), unless the authorization is terminated or revoked.  Performed at [facility], [address] [place], [place] [address]      Labs: Basic Metabolic Panel: Recent Labs  Lab 08/16/20 0630 08/17/20 0134 08/18/20 0145 08/19/20 0138 08/20/20 0231  NA 139 138 136 136 137  K 3.4* 3.4* 3.8 4.4 4.6  CL 99 98 97* 97* 98  CO2 29 30 33* 33* 28  GLUCOSE 106* 93 104* 101* 86  BUN 11 13 14 20 21   CREATININE 0.99 1.05* 1.11* 1.18* 1.00  CALCIUM 8.5* 8.5* 8.8* 8.9 8.9  MG  --   --  2.0  --   --    Liver Function Tests: No results for input(s): AST, ALT, ALKPHOS, BILITOT, PROT, ALBUMIN in the last 168 hours. No results for input(s): LIPASE, AMYLASE in the last 168 hours. No results for input(s): AMMONIA in the last 168 hours. CBC: Recent Labs  Lab 08/15/20 2209 08/16/20 0630 08/17/20 0134  WBC 8.3 9.3 9.5  HGB 14.4 14.7 14.6  HCT 44.6 44.3 43.9  MCV 92.1 91.2 90.3  PLT 467* 439* 470*   Cardiac Enzymes: No results for input(s): CKTOTAL, CKMB, CKMBINDEX, TROPONINI in the last 168 hours. BNP: BNP (last 3 results) Recent Labs    08/15/20 2209  BNP 168.7*    ProBNP (last 3 results) No results for input(s): PROBNP in the last 8760 hours.  CBG: No results for input(s): GLUCAP in the last 168 hours.     Signed:  Florencia Reasons MD, PhD, FACP  [facility] 08/20/2020, 10:45 AM

## 2020-08-20 NOTE — Plan of Care (Signed)
  Problem: Education: Goal: Knowledge of General Education information will improve Description: Including pain rating scale, medication(s)/side effects and non-pharmacologic comfort measures Outcome: Adequate for Discharge   

## 2020-08-20 NOTE — Progress Notes (Signed)
Occupational Therapy Treatment Patient Details Name: [name] MRN: [id number] DOB: Sep 10, 1945 Today's Date: 08/20/2020    History of present illness Pt is a 75 yr old female who presented due to SOB, edema in BLE and  chest discomfort. Pt admitted for Acute on chronic diastolic CHF.   PMH but not limited to: CAD, hypothyroidism, afib   OT comments  Pt had no report of dizziness in session and reported they do not feel sick anymore. Pt was able to complete transfers with sit to stand, ambulation with FW, toilet tasks and hygiene all with supervision with cues on pacing due to HR. She was educated on energy conservation techniques to complete tasks with the return home.  Pt currently with functional limitations due to the deficits listed below (see OT Problem List).  Pt will benefit from skilled OT to increase their safety and independence with ADL and functional mobility for ADL to facilitate discharge to venue listed below.    Follow Up Recommendations  Home health OT;Supervision - Intermittent    Equipment Recommendations       Recommendations for Other Services      Precautions / Restrictions Precautions Precautions: Fall Precaution Comments: HR Restrictions Weight Bearing Restrictions: No       Mobility Bed Mobility Overal bed mobility:  (pt presented sitting in chair)                  Transfers Overall transfer level: Needs assistance Equipment used: Rolling walker (2 wheeled) Transfers: Sit to/from Stand Sit to Stand: Supervision         General transfer comment: pacing self/line management    Balance Overall balance assessment: Needs assistance Sitting-balance support: No upper extremity supported Sitting balance-Leahy Scale: Good     Standing balance support: Bilateral upper extremity supported Standing balance-Leahy Scale: Fair Standing balance comment: Pt was able to complete hand washing stadning at the sick but due to HR rate limited  ability                           ADL either performed or assessed with clinical judgement   ADL Overall ADL's : Needs assistance/impaired Eating/Feeding: Independent;Sitting   Grooming: Wash/dry hands;Wash/dry face;Supervision/safety;Cueing for sequencing;Cueing for safety;Standing   Upper Body Bathing: Modified independent;Cueing for safety;Cueing for sequencing;Sitting   Lower Body Bathing: Supervison/ safety;Cueing for safety;Cueing for sequencing;Sit to/from stand   Upper Body Dressing : Supervision/safety;Cueing for safety;Cueing for sequencing;Sitting   Lower Body Dressing: Supervision/safety;Cueing for safety;Cueing for sequencing;Sit to/from stand   Toilet Transfer: Supervision/safety;Regular Toilet;Grab bars;RW   Toileting- Water quality scientist and Hygiene: Supervision/safety;Cueing for safety;Cueing for sequencing;Sit to/from stand       Functional mobility during ADLs: Supervision/safety;Cueing for safety;Cueing for sequencing;Rolling walker       Vision       Perception     Praxis      Cognition Arousal/Alertness: Awake/alert Behavior During Therapy: WFL for tasks assessed/performed Overall Cognitive Status: Within Functional Limits for tasks assessed                                          Exercises     Shoulder Instructions       General Comments      Pertinent Vitals/ Pain       Pain Assessment: No/denies pain  Home Living  Prior Functioning/Environment              Frequency  Min 2X/week        Progress Toward Goals  OT Goals(current goals can now be found in the care plan section)  Progress towards OT goals: Progressing toward goals  Acute Rehab OT Goals Patient Stated Goal: to go home OT Goal Formulation: With patient Time For Goal Achievement: 08/29/20 Potential to Achieve Goals: Good ADL Goals Pt Will Perform Upper Body Bathing:  with supervision;Independently;standing;sitting Pt Will Perform Lower Body Bathing: Independently;sit to/from stand Pt Will Perform Tub/Shower Transfer: Tub transfer;with modified independence;ambulating;shower seat  Plan Discharge plan remains appropriate    Co-evaluation                 AM-PAC OT "6 Clicks" Daily Activity     Outcome Measure   Help from another person eating meals?: None Help from another person taking care of personal grooming?: None Help from another person toileting, which includes using toliet, bedpan, or urinal?: A Little Help from another person bathing (including washing, rinsing, drying)?: A Little Help from another person to put on and taking off regular upper body clothing?: None Help from another person to put on and taking off regular lower body clothing?: A Little 6 Click Score: 21    End of Session Equipment Utilized During Treatment: Gait belt;Rolling walker  OT Visit Diagnosis: Unsteadiness on feet (R26.81);Other abnormalities of gait and mobility (R26.89)   Activity Tolerance Other (comment) (HR)   Patient Left with call bell/phone within reach;in bed   Nurse Communication Other (comment) (pt being dc per md and needs to call grandson)        Time: 9292-4462 OT Time Calculation (min): 25 min  Charges: OT General Charges $OT Visit: 1 Visit OT Treatments $Self Care/Home Management : 23-37 mins  Joeseph Amor OTR/L  [place]  [phone] office number [phone] pager number    Joeseph Amor 08/20/2020, 10:46 AM

## 2020-08-20 NOTE — Care Management Important Message (Signed)
Important Message  Patient Details  Name: [name] MRN: [id number] Date of Birth: 16-Apr-1945   Medicare Important Message Given:  Yes     Shelda Altes 08/20/2020, 11:19 AM

## 2020-09-04 ENCOUNTER — Ambulatory Visit (HOSPITAL_BASED_OUTPATIENT_CLINIC_OR_DEPARTMENT_OTHER): Payer: Medicare Other | Admitting: Family

## 2020-09-11 ENCOUNTER — Ambulatory Visit (INDEPENDENT_AMBULATORY_CARE_PROVIDER_SITE_OTHER): Payer: Medicare Other

## 2020-09-11 ENCOUNTER — Other Ambulatory Visit: Payer: Self-pay

## 2020-09-11 DIAGNOSIS — Z5181 Encounter for therapeutic drug level monitoring: Secondary | ICD-10-CM | POA: Diagnosis not present

## 2020-09-11 DIAGNOSIS — I4819 Other persistent atrial fibrillation: Secondary | ICD-10-CM

## 2020-09-11 LAB — POCT INR: INR: 2.5 (ref 2.0–3.0)

## 2020-09-11 NOTE — Patient Instructions (Signed)
Description   Continue on same dosage Coumadin '5mg'$  (1 tablet) daily except 7.5 mg (1.5 tablets) on Tuesdays and Fridays. Recheck INR 6 weeks. Call us with any new medications or concerns [phone].

## 2020-10-20 ENCOUNTER — Other Ambulatory Visit: Payer: Self-pay

## 2020-10-20 ENCOUNTER — Ambulatory Visit (INDEPENDENT_AMBULATORY_CARE_PROVIDER_SITE_OTHER): Payer: Medicare Other

## 2020-10-20 ENCOUNTER — Ambulatory Visit: Payer: Medicare Other | Admitting: Cardiology

## 2020-10-20 DIAGNOSIS — Z5181 Encounter for therapeutic drug level monitoring: Secondary | ICD-10-CM | POA: Diagnosis not present

## 2020-10-20 DIAGNOSIS — I4819 Other persistent atrial fibrillation: Secondary | ICD-10-CM | POA: Diagnosis not present

## 2020-10-20 LAB — POCT INR: INR: 2.8 (ref 2.0–3.0)

## 2020-10-20 NOTE — Patient Instructions (Addendum)
Description   Continue on same dosage Coumadin 5mg  (1 tablet) daily except 7.5 mg (1.5 tablets) on Tuesdays and Fridays. Recheck INR 3 weeks. Call us with any new medications or concerns [phone].

## 2020-11-05 ENCOUNTER — Ambulatory Visit (HOSPITAL_BASED_OUTPATIENT_CLINIC_OR_DEPARTMENT_OTHER): Payer: Medicare Other | Admitting: Cardiology

## 2020-11-12 ENCOUNTER — Ambulatory Visit: Payer: Medicare Other | Admitting: Cardiology

## 2020-11-19 ENCOUNTER — Other Ambulatory Visit: Payer: Self-pay | Admitting: *Deleted

## 2020-11-19 ENCOUNTER — Other Ambulatory Visit: Payer: Self-pay | Admitting: Cardiology

## 2020-11-19 MED ORDER — WARFARIN SODIUM 5 MG PO TABS: ORAL_TABLET | ORAL | 0 refills | Status: DC

## 2020-11-27 ENCOUNTER — Other Ambulatory Visit: Payer: Self-pay

## 2020-11-27 ENCOUNTER — Ambulatory Visit (INDEPENDENT_AMBULATORY_CARE_PROVIDER_SITE_OTHER): Payer: Medicare Other

## 2020-11-27 DIAGNOSIS — Z5181 Encounter for therapeutic drug level monitoring: Secondary | ICD-10-CM

## 2020-11-27 DIAGNOSIS — I4819 Other persistent atrial fibrillation: Secondary | ICD-10-CM | POA: Diagnosis not present

## 2020-11-27 LAB — POCT INR: INR: 3 (ref 2.0–3.0)

## 2020-11-27 NOTE — Patient Instructions (Signed)
Description   Continue on same dosage Coumadin 5mg  (1 tablet) daily except 7.5 mg (1.5 tablets) on Tuesdays and Fridays. Recheck INR 5 weeks. Call us with any new medications or concerns [phone].

## 2021-01-01 ENCOUNTER — Other Ambulatory Visit: Payer: Self-pay

## 2021-01-01 ENCOUNTER — Ambulatory Visit (INDEPENDENT_AMBULATORY_CARE_PROVIDER_SITE_OTHER): Payer: Medicare Other | Admitting: *Deleted

## 2021-01-01 DIAGNOSIS — Z5181 Encounter for therapeutic drug level monitoring: Secondary | ICD-10-CM

## 2021-01-01 DIAGNOSIS — I4819 Other persistent atrial fibrillation: Secondary | ICD-10-CM | POA: Diagnosis not present

## 2021-01-01 LAB — POCT INR: INR: 3.2 — AB (ref 2.0–3.0)

## 2021-01-01 NOTE — Patient Instructions (Addendum)
Description   Hold today, then continue on same dosage Coumadin 5mg  (1 tablet) daily except 7.5 mg (1.5 tablets) on Tuesdays and Fridays. Recheck INR 5 weeks. Call us with any new medications or concerns [phone].

## 2021-01-04 ENCOUNTER — Other Ambulatory Visit: Payer: Self-pay | Admitting: Physician Assistant

## 2021-02-05 ENCOUNTER — Other Ambulatory Visit: Payer: Self-pay

## 2021-02-05 ENCOUNTER — Ambulatory Visit (INDEPENDENT_AMBULATORY_CARE_PROVIDER_SITE_OTHER): Payer: Medicare Other | Admitting: *Deleted

## 2021-02-05 DIAGNOSIS — I4819 Other persistent atrial fibrillation: Secondary | ICD-10-CM

## 2021-02-05 DIAGNOSIS — Z5181 Encounter for therapeutic drug level monitoring: Secondary | ICD-10-CM | POA: Diagnosis not present

## 2021-02-05 LAB — POCT INR: INR: 2.9 (ref 2.0–3.0)

## 2021-02-05 NOTE — Patient Instructions (Signed)
Description   Continue on same dosage Coumadin 5mg  (1 tablet) daily except 7.5 mg (1.5 tablets) on Tuesdays and Fridays. Recheck INR 6 weeks. Call us with any new medications or concerns [phone].

## 2021-03-19 ENCOUNTER — Other Ambulatory Visit: Payer: Self-pay

## 2021-03-19 ENCOUNTER — Ambulatory Visit (INDEPENDENT_AMBULATORY_CARE_PROVIDER_SITE_OTHER): Payer: Medicare Other

## 2021-03-19 DIAGNOSIS — I4819 Other persistent atrial fibrillation: Secondary | ICD-10-CM

## 2021-03-19 DIAGNOSIS — Z5181 Encounter for therapeutic drug level monitoring: Secondary | ICD-10-CM

## 2021-03-19 LAB — POCT INR: INR: 3.2 — AB (ref 2.0–3.0)

## 2021-03-19 NOTE — Patient Instructions (Signed)
Description   Skip today's dosage of Warfarin, then resume same dosage Coumadin 5mg  (1 tablet) daily except 7.5 mg (1.5 tablets) on Tuesdays and Fridays. Recheck INR 5 weeks. Call us with any new medications or concerns [phone].

## 2021-03-30 ENCOUNTER — Encounter (HOSPITAL_COMMUNITY): Payer: Self-pay

## 2021-03-30 ENCOUNTER — Other Ambulatory Visit: Payer: Self-pay

## 2021-03-30 ENCOUNTER — Observation Stay (HOSPITAL_COMMUNITY)
Admission: EM | Admit: 2021-03-30 | Discharge: 2021-04-02 | Disposition: A | Discharge status: Home / Self Care | Payer: Medicare Other | Attending: Internal Medicine | Admitting: Internal Medicine

## 2021-03-30 ENCOUNTER — Emergency Department (HOSPITAL_COMMUNITY): Payer: Medicare Other

## 2021-03-30 DIAGNOSIS — R0789 Other chest pain: Secondary | ICD-10-CM

## 2021-03-30 DIAGNOSIS — I480 Paroxysmal atrial fibrillation: Secondary | ICD-10-CM | POA: Diagnosis not present

## 2021-03-30 DIAGNOSIS — I5033 Acute on chronic diastolic (congestive) heart failure: Secondary | ICD-10-CM | POA: Diagnosis not present

## 2021-03-30 DIAGNOSIS — Z7901 Long term (current) use of anticoagulants: Secondary | ICD-10-CM | POA: Insufficient documentation

## 2021-03-30 DIAGNOSIS — I5032 Chronic diastolic (congestive) heart failure: Secondary | ICD-10-CM | POA: Diagnosis not present

## 2021-03-30 DIAGNOSIS — I110 Hypertensive heart disease with heart failure: Secondary | ICD-10-CM | POA: Diagnosis not present

## 2021-03-30 DIAGNOSIS — Z79899 Other long term (current) drug therapy: Secondary | ICD-10-CM | POA: Diagnosis not present

## 2021-03-30 DIAGNOSIS — I2510 Atherosclerotic heart disease of native coronary artery without angina pectoris: Secondary | ICD-10-CM

## 2021-03-30 DIAGNOSIS — R2689 Other abnormalities of gait and mobility: Secondary | ICD-10-CM | POA: Diagnosis not present

## 2021-03-30 DIAGNOSIS — Z20822 Contact with and (suspected) exposure to covid-19: Secondary | ICD-10-CM | POA: Insufficient documentation

## 2021-03-30 DIAGNOSIS — I4819 Other persistent atrial fibrillation: Secondary | ICD-10-CM | POA: Diagnosis not present

## 2021-03-30 DIAGNOSIS — Z955 Presence of coronary angioplasty implant and graft: Secondary | ICD-10-CM | POA: Diagnosis not present

## 2021-03-30 DIAGNOSIS — E039 Hypothyroidism, unspecified: Secondary | ICD-10-CM | POA: Diagnosis not present

## 2021-03-30 DIAGNOSIS — R079 Chest pain, unspecified: Secondary | ICD-10-CM | POA: Diagnosis present

## 2021-03-30 DIAGNOSIS — I10 Essential (primary) hypertension: Secondary | ICD-10-CM

## 2021-03-30 DIAGNOSIS — E876 Hypokalemia: Secondary | ICD-10-CM | POA: Insufficient documentation

## 2021-03-30 LAB — CBC WITH DIFFERENTIAL/PLATELET
Abs Immature Granulocytes: 0.02 10*3/uL (ref 0.00–0.07)
Basophils Absolute: 0.1 10*3/uL (ref 0.0–0.1)
Basophils Relative: 1 %
Eosinophils Absolute: 0.2 10*3/uL (ref 0.0–0.5)
Eosinophils Relative: 2 %
HCT: 44.5 % (ref 36.0–46.0)
Hemoglobin: 14.4 g/dL (ref 12.0–15.0)
Immature Granulocytes: 0 %
Lymphocytes Relative: 43 %
Lymphs Abs: 3.3 10*3/uL (ref 0.7–4.0)
MCH: 29.8 pg (ref 26.0–34.0)
MCHC: 32.4 g/dL (ref 30.0–36.0)
MCV: 91.9 fL (ref 80.0–100.0)
Monocytes Absolute: 0.6 10*3/uL (ref 0.1–1.0)
Monocytes Relative: 8 %
Neutro Abs: 3.6 10*3/uL (ref 1.7–7.7)
Neutrophils Relative %: 46 %
Platelets: 491 10*3/uL — ABNORMAL HIGH (ref 150–400)
RBC: 4.84 MIL/uL (ref 3.87–5.11)
RDW: 15.9 % — ABNORMAL HIGH (ref 11.5–15.5)
WBC: 7.7 10*3/uL (ref 4.0–10.5)
nRBC: 0 % (ref 0.0–0.2)

## 2021-03-30 LAB — RESP PANEL BY RT-PCR (FLU A&B, COVID) ARPGX2
Influenza A by PCR: NEGATIVE
Influenza B by PCR: NEGATIVE
SARS Coronavirus 2 by RT PCR: NEGATIVE

## 2021-03-30 LAB — COMPREHENSIVE METABOLIC PANEL
ALT: 12 U/L (ref 0–44)
AST: 18 U/L (ref 15–41)
Albumin: 3 g/dL — ABNORMAL LOW (ref 3.5–5.0)
Alkaline Phosphatase: 58 U/L (ref 38–126)
Anion gap: 9 (ref 5–15)
BUN: 11 mg/dL (ref 8–23)
CO2: 29 mmol/L (ref 22–32)
Calcium: 8.5 mg/dL — ABNORMAL LOW (ref 8.9–10.3)
Chloride: 101 mmol/L (ref 98–111)
Creatinine, Ser: 1.13 mg/dL — ABNORMAL HIGH (ref 0.44–1.00)
GFR, Estimated: 51 mL/min — ABNORMAL LOW (ref >60)
Glucose, Bld: 108 mg/dL — ABNORMAL HIGH (ref 70–99)
Potassium: 3.7 mmol/L (ref 3.5–5.1)
Sodium: 139 mmol/L (ref 135–145)
Total Bilirubin: 0.5 mg/dL (ref 0.3–1.2)
Total Protein: 6 g/dL — ABNORMAL LOW (ref 6.5–8.1)

## 2021-03-30 LAB — PROTIME-INR
INR: 1.9 — ABNORMAL HIGH (ref 0.8–1.2)
Prothrombin Time: 21.8 seconds — ABNORMAL HIGH (ref 11.4–15.2)

## 2021-03-30 LAB — TROPONIN I (HIGH SENSITIVITY)
Troponin I (High Sensitivity): 7 ng/L (ref <18)
Troponin I (High Sensitivity): 7 ng/L (ref <18)

## 2021-03-30 MED ORDER — ASPIRIN 325 MG PO TABS
325.0000 mg | ORAL_TABLET | Freq: Every day | ORAL | Status: DC
  Administered 2021-03-30 – 2021-03-31 (×2): 325 mg via ORAL
  Filled 2021-03-30 (×3): qty 1

## 2021-03-30 MED ORDER — WARFARIN - PHARMACIST DOSING INPATIENT: Freq: Every day | Status: DC

## 2021-03-30 MED ORDER — METOPROLOL SUCCINATE ER 25 MG PO TB24
25.0000 mg | ORAL_TABLET | Freq: Every day | ORAL | Status: DC
  Administered 2021-03-31 – 2021-04-02 (×3): 25 mg via ORAL
  Filled 2021-03-30 (×3): qty 1

## 2021-03-30 MED ORDER — VITAMIN D 25 MCG (1000 UNIT) PO TABS
2000.0000 [IU] | ORAL_TABLET | Freq: Every morning | ORAL | Status: DC
  Administered 2021-03-31 – 2021-04-02 (×3): 2000 [IU] via ORAL
  Filled 2021-03-30 (×3): qty 2

## 2021-03-30 MED ORDER — SODIUM CHLORIDE 0.9% FLUSH
3.0000 mL | Freq: Two times a day (BID) | INTRAVENOUS | Status: DC
  Administered 2021-03-31 – 2021-04-02 (×6): 3 mL via INTRAVENOUS

## 2021-03-30 MED ORDER — FUROSEMIDE 10 MG/ML IJ SOLN
40.0000 mg | Freq: Two times a day (BID) | INTRAMUSCULAR | Status: DC
  Administered 2021-03-31 – 2021-04-01 (×5): 40 mg via INTRAVENOUS
  Filled 2021-03-30 (×6): qty 4

## 2021-03-30 MED ORDER — SOTALOL HCL 120 MG PO TABS
120.0000 mg | ORAL_TABLET | Freq: Two times a day (BID) | ORAL | Status: DC
  Administered 2021-03-31 – 2021-04-02 (×6): 120 mg via ORAL
  Filled 2021-03-30 (×8): qty 1

## 2021-03-30 MED ORDER — SODIUM CHLORIDE 0.9% FLUSH: 3.0000 mL | INTRAVENOUS | Status: DC | PRN

## 2021-03-30 MED ORDER — ISOSORBIDE MONONITRATE ER 30 MG PO TB24
30.0000 mg | ORAL_TABLET | Freq: Every day | ORAL | Status: DC
  Administered 2021-03-31 – 2021-04-02 (×3): 30 mg via ORAL
  Filled 2021-03-30 (×3): qty 1

## 2021-03-30 MED ORDER — ONDANSETRON HCL 4 MG PO TABS: 4.0000 mg | ORAL_TABLET | Freq: Four times a day (QID) | ORAL | Status: DC | PRN

## 2021-03-30 MED ORDER — LACTATED RINGERS IV BOLUS
1000.0000 mL | Freq: Once | INTRAVENOUS | Status: DC
Start: 2021-03-30 — End: 2021-03-30

## 2021-03-30 MED ORDER — POLYETHYLENE GLYCOL 3350 17 G PO PACK: 17.0000 g | PACK | Freq: Every day | ORAL | Status: DC | PRN

## 2021-03-30 MED ORDER — FUROSEMIDE 10 MG/ML IJ SOLN
40.0000 mg | Freq: Once | INTRAMUSCULAR | Status: AC
  Administered 2021-03-30: 40 mg via INTRAVENOUS
  Filled 2021-03-30: qty 4

## 2021-03-30 MED ORDER — ONDANSETRON HCL 4 MG/2ML IJ SOLN: 4.0000 mg | Freq: Four times a day (QID) | INTRAMUSCULAR | Status: DC | PRN

## 2021-03-30 MED ORDER — ISOSORBIDE MONONITRATE ER 30 MG PO TB24: 30.0000 mg | ORAL_TABLET | Freq: Every day | ORAL | Status: DC

## 2021-03-30 MED ORDER — LEVOTHYROXINE SODIUM 50 MCG PO TABS
50.0000 ug | ORAL_TABLET | Freq: Every day | ORAL | Status: DC
  Administered 2021-03-31 – 2021-04-02 (×3): 50 ug via ORAL
  Filled 2021-03-30 (×3): qty 1

## 2021-03-30 MED ORDER — LACTATED RINGERS IV BOLUS
500.0000 mL | Freq: Once | INTRAVENOUS | Status: AC
  Administered 2021-03-30: 500 mL via INTRAVENOUS

## 2021-03-30 MED ORDER — SODIUM CHLORIDE 0.9 % IV SOLN
250.0000 mL | INTRAVENOUS | Status: DC | PRN
Start: 2021-03-30 — End: 2021-04-02

## 2021-03-30 NOTE — ED Notes (Signed)
Pt was insistent on ambulating  to bathroom regardless of the risks MD aware pt ambulated with steady

## 2021-03-30 NOTE — ED Notes (Addendum)
Pt ambulated to the bathroom.  

## 2021-03-30 NOTE — ED Triage Notes (Signed)
9am with center sharp CP with numbness tto the left arm takes nitro felt better takes BP and 220/120 also verified with manual by EMS daughter called for this. Took 40 of lasix prior to arrival currently resolved CP after nitro

## 2021-03-30 NOTE — Consult Note (Addendum)
Cardiology Consultation:   Patient ID: [name] MRN: [id number]; DOB: [birth year]  Admit date: 03/30/2021 Date of Consult: 03/30/2021  PCP:  Elisabeth Cara, [place] [facility] Cardiologist:  [name], MD  Electrophysiologist:  Constance Haw, MD    Patient Profile:   [name] is a 76 y.o. female with a hx of CAD s/p DES to LAD in 04/2015, persistent atrial fibrillation, dilated CM, Chronic diastolic CHF, DM, HTN, HLD, obesity, who is being seen 03/30/2021 for the evaluation of chest pain at the request of Dr. Matilde Sprang.  History of Present Illness:   Ms. [name] is a 76 year old female with above medical history who is followed by Dr. Marlou Porch. Per chart review,  had a negative stress test in 03/2005. TEE prior to DCCV in the same month showed normal EF and normal LA size. Patient maintained sinus rhythm on amiodarone until April 2016. At that time, patient was taken off her amiodarone. Was noted to be in atrial fibrillation in December 2016. Echocardiogram on 01/06/2015 showed EF 25-30%. Patient was admitted to the hospital in March 2017 for paroxysmal>persistent atrial fibrillation, chronic systolic heart failure. Underwent left heart catheterization on 04/17/2015 that showed a 1st diagonal lesion that was 50% stenosed, an a mid LAD lesion that was 80% stenosed. A DES was placed to the LAD. Following her cath, patient continued to have chronic atrial fibrillation. Echocardiogram on 08/05/2015 showed that her EF had improved to 50-55%. Patient was seen by EP and undwerent TEE and tikosyn loading on 09/30/2015. However, patient decided to discontinue the tikosyn due to financial concerns on 10/01/15. Patient underwent cardioversion on 10/03/2015 and was started on sotalol. Follow up echocardiogram on 08/2016 showed EF 55-60%, grade II diastolic dysfunction. Patient has some SOB and chest pain so she underwent a nuclear stress test on 08/23/2018 that was normal and low  risk study. Echocardiogram on 08/23/2018 showed EF 55-60, grade II diastolic dysfunction.   Patient was most recently seen by Dr. Marlou Porch on 05/08/2020. At that visit, the patient was doing well.   Patient presented to the ED on 2/28 complaining of sharp CP in the center of her chest with numbness to the left arm, BP 220/120. Labs in the ED showed Na 139, K 3.7, creatinien 1.13, eGFR 51, hemoglobin 14.4, WBC 7.7. hsTn 7. CXR showed stable cardiomegaly with mild central pulmonary vascular congestion. EKG showed atrial fibrillation, HR 76 BPM, right axis deviation.   On interview, patient reports that she is "feeling miserable all over." Explained that she has been having left forearm pain for the past 2 days. Pain is achy and associated with some numbness in her left hand. Reported having an episode of shooting chest pain from her sternum to her left hand. Denies any chest pain before or after the episode of shooting pain. Has some SOB on exertion. Also complained of dizziness, lightheadedness. Denies any nausea, vomiting, fever/chills, body aches.   Past Medical History:  Diagnosis Date   CAD (coronary artery disease)    a. LHC 3/17: mLAD 80, D1 50, pRCA 30, EF 25-35%; PCI: 3.5 x 16 mm Synergy DES to mLAD // Myoview 08/2018: EF 54, normal perfusion; Low Risk   Chronic diastolic CHF 2/53/6644   Echocardiogram 08/2018: EF 55-60, mild septal basal hypertrophy, Gr 2 DD, elevated LVEDP, mild LAE, mild AI   Dilated cardiomyopathy >> EF returned to normal in NSR    Probable Tachycardia Mediated // a. Echo 12/16: Mild concentric  LVH, EF 25-30%, anteroseptal akinesis, inferoseptal akinesis, anterior akinesis, trivial AI, mild MR, moderate LAE, trivial TR, trivial PI, PASP 33 mmHg  //  b. Echo 2/17: Mild LVH, EF 25-30%, diffuse HK, mild LAE  //  c. Echo 7/17: mild LVH, EF 50-55%, no RWMA, trivial AI, mild to mod LAE, mild RAE   Febrile seizure ([place]) 2004   "during TTP thing; cause my temp was 106"   High  cholesterol    History of blood transfusion 2004   "several; related to TTP"   Hypertension    Hypothyroidism    Morbid obesity ([place]) 01/07/2013   Osteoarthritis of back    Paroxysmal atrial fibrillation ([place]) 08/01/2011   a. 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    Past Surgical History:  Procedure Laterality Date   ABDOMINAL EXPLORATION SURGERY     "opened me up 2-3 times for blocked intestines"   APPENDECTOMY     BACK SURGERY     BASAL CELL CARCINOMA EXCISION Right    "neck"   CARDIAC CATHETERIZATION N/A 04/17/2015   Procedure: Left Heart Cath and Coronary Angiography;  Surgeon: Jettie Booze, MD;  Location: [facility];  Service: Cardiovascular;  Laterality: N/A;   CARDIAC CATHETERIZATION  04/17/2015   Procedure: Coronary Stent Intervention;  Surgeon: Jettie Booze, MD;  Location: [facility];  Service: Cardiovascular;;   CARDIOVERSION  06/2002; 03/2005; 05/2005   Archie Endo 06/16/2010   CARDIOVERSION N/A 10/03/2015   Procedure: CARDIOVERSION;  Surgeon: Deboraha Sprang, MD;  Location: [facility];  Service: Cardiovascular;  Laterality: N/A;   CATARACT EXTRACTION W/ INTRAOCULAR LENS  IMPLANT, BILATERAL Bilateral    CHOLECYSTECTOMY OPEN     CORONARY ANGIOPLASTY     DILATION AND CURETTAGE OF UTERUS     S/P "miscarriage"   FOOT SURGERY Right    "for nerve damage"   SALIVARY STONE REMOVAL Left    "had tumor wrapped around it"   SPLENECTOMY, TOTAL     Archie Endo 06/16/2010   TEE WITHOUT CARDIOVERSION N/A 09/30/2015   Procedure: TRANSESOPHAGEAL ECHOCARDIOGRAM (TEE);  Surgeon: Skeet Latch, MD;  Location: [facility];  Service: Cardiovascular;  Laterality: N/A;   THORACIC DISCECTOMY  05/2002   THYROIDECTOMY, PARTIAL  X 2   "grew back"   TONSILLECTOMY     TUBAL LIGATION      TUMOR EXCISION     "benign tumor in my neck"   VAGINAL HYSTERECTOMY       Home Medications:  Prior to Admission medications   Medication Sig Start Date End Date Taking? Authorizing Provider  Cholecalciferol (VITAMIN D3) 2000 units capsule Take 2,000 Units by mouth every morning.    [provider]  furosemide (LASIX) 40 MG tablet TAKE 1 TABLET BY MOUTH EVERY DAY 01/05/21   Jerline Pain, MD  isosorbide mononitrate (IMDUR) 30 MG 24 hr tablet TAKE 1 TABLET BY MOUTH EVERY DAY 07/28/20   Jerline Pain, MD  levothyroxine (SYNTHROID, LEVOTHROID) 50 MCG tablet Take 50 mcg by mouth daily before breakfast.    [provider]  metoprolol succinate (TOPROL-XL) 25 MG 24 hr tablet TAKE 1 TABLET BY MOUTH DAILY. TAKE WITH OR IMMEDIATELY FOLLOWING A MEAL. 01/05/21   Jerline Pain, MD  nitroGLYCERIN (NITROSTAT) 0.4 MG SL tablet PLACE 1 TABLET (0.4  MG TOTAL) UNDER THE TONGUE EVERY 5 (FIVE) MINUTES AS NEEDED FOR CHEST PAIN. 09/24/18   Burtis Junes, NP  potassium chloride (KLOR-CON) 10 MEQ tablet Take 10 mEq by mouth daily.    [provider]  sotalol (BETAPACE) 120 MG tablet TAKE 1 TABLET BY MOUTH 2 TIMES DAILY. 11/19/20   Jerline Pain, MD  warfarin (COUMADIN) 5 MG tablet Take 1 tablet by mouth daily except 1.5 tablets on Tuesdays and Fridays or as directed by Anticoagulation Clinic. 11/19/20   Jerline Pain, MD    Inpatient Medications: Scheduled Meds:  aspirin  325 mg Oral Daily   furosemide  40 mg Intravenous Once   Continuous Infusions:  PRN Meds:   Allergies:    Allergies  Allergen Reactions   Ceftriaxone Rash and Other (See Comments)    "TTP"/Was hospitalized and in a coma for 60 days, per patient (Thrombotic Thrombocytopenic Purpura)   Penicillins Hives, Rash and Other (See Comments)    Cannot tolerate any "-CILLINS"; causes welts!! Has patient had a PCN reaction causing immediate rash, facial/tongue/throat swelling, SOB or lightheadedness with hypotension:  Yes Has patient had a PCN reaction causing severe rash involving mucus membranes or skin necrosis: No Has patient had a PCN reaction that required hospitalization No Has patient had a PCN reaction occurring within the last 10 years: No If all of the above answers are "NO", then may proceed with Cephalosporin use.   Levofloxacin Other (See Comments)    Thrombocytopenia     Levofloxacin Other (See Comments)    Dropped blood platelets extremely low   Oxycodone Itching   Tape Other (See Comments)    Prefers paper or cloth tape Other reaction(s): Other (See Comments) Prefers paper or cloth tape Other reaction(s): Other (See Comments) Prefers paper or cloth tape   Alendronate Rash   Aspirin Other (See Comments)    Patient stated she is unable to take due to blood thinner    Oxycodone-Acetaminophen Itching   Penicillin G Rash    Social History:   Social History   Socioeconomic History   Marital status: Married    Spouse name: Not on file   Number of children: Not on file   Years of education: Not on file   Highest education level: Not on file  Occupational History   Occupation: retired  Tobacco Use   Smoking status: Never   Smokeless tobacco: Never  Vaping Use   Vaping Use: Never used  Substance and Sexual Activity   Alcohol use: No   Drug use: No   Sexual activity: Never  Other Topics Concern   Not on file  Social History Narrative   Not on file   Social Determinants of Health   Financial Resource Strain: Not on file  Food Insecurity: Not on file  Transportation Needs: Not on file  Physical Activity: Not on file  Stress: Not on file  Social Connections: Not on file  Intimate Partner Violence: Not on file    Family History:    Family History  Problem Relation Age of Onset   Heart attack Mother    Cancer Sister    Cancer Brother      ROS:  Please see the history of present illness.   All other ROS reviewed and negative.     Physical Exam/Data:    Vitals:   03/30/21 1200 03/30/21 1230 03/30/21 1330 03/30/21 1530  BP: (!) 148/100 (!) 154/91 (!) 143/102 (!) 152/89  Pulse: 77 76 83 65  Resp: 16 (!) _0 Temp:      TempSrc:      SpO2: 96% 96% 96% 98%  Weight:      Height:       No intake or output data in the 24 hours ending 03/30/21 1549 Last 3 Weights 03/30/2021 08/20/2020 08/19/2020  Weight (lbs) 267 lb 3.2 oz 267 lb 4.8 oz 266 lb 12.1 oz  Weight (kg) 121.2 kg 121.246 kg 121 kg     Body mass index is 45.86 kg/m.  General:  Elderly female in no acute distress.  HEENT: normal Neck: no JVD, supple, nontender. No cervical spine tenderness.  Vascular: Radial pulses 2+ bilaterally Cardiac:  normal S1, S2; irregular rate and rhythm Lungs:  clear to auscultation bilaterally, no wheezing, rhonchi or rales  Abd: soft, nontender, no hepatomegaly  Ext: trace edema superimposed on large body habitus  Musculoskeletal:  No deformities, BUE and BLE strength normal and equal Skin: warm and dry  Neuro:  CNs 2-12 intact, no focal abnormalities noted Psych:  Normal affect   EKG:  The EKG was personally reviewed and demonstrates:  Atrial fibrillation, right axis deviation Telemetry:  Telemetry was personally reviewed and demonstrates:  atrial fibrillation, rate well controlled   Relevant CV Studies:   Laboratory Data:  High Sensitivity Troponin:   Recent Labs  Lab 03/30/21 1117  TROPONINIHS 7     Chemistry Recent Labs  Lab 03/30/21 1117  NA 139  K 3.7  CL 101  CO2 29  GLUCOSE 108*  BUN 11  CREATININE 1.13*  CALCIUM 8.5*  GFRNONAA 51*  ANIONGAP 9    Recent Labs  Lab 03/30/21 1117  PROT 6.0*  ALBUMIN 3.0*  AST 18  ALT 12  ALKPHOS 58  BILITOT 0.5   Lipids No results for input(s): CHOL, TRIG, HDL, LABVLDL, LDLCALC, CHOLHDL in the last 168 hours.  Hematology Recent Labs  Lab 03/30/21 1117  WBC 7.7  RBC 4.84  HGB 14.4  HCT 44.5  MCV 91.9  MCH 29.8  MCHC 32.4  RDW 15.9*  PLT 491*   Thyroid No  results for input(s): TSH, FREET4 in the last 168 hours.  BNPNo results for input(s): BNP, PROBNP in the last 168 hours.  DDimer No results for input(s): DDIMER in the last 168 hours.   Radiology/Studies:  DG Chest 2 View  Result Date: 03/30/2021 CLINICAL DATA:  Chest pain, shortness of breath. EXAM: CHEST - 2 VIEW COMPARISON:  August 15, 2020. FINDINGS: Stable cardiomegaly with mild central pulmonary vascular congestion. No definite consolidative process is noted. Bony thorax is unremarkable. IMPRESSION: Stable cardiomegaly with mild central pulmonary vascular congestion. Electronically Signed   By: Marijo Conception M.D.   On: 03/30/2021 11:43     Assessment and Plan:   Chest Pain  - Chest pain is very atypical, as patient describes one episode of sharp pain that shot from her sternum down to her left hand. Has been having left forearm dull pain or 2 days - hsTn 7  - EKG without signs of ischemia  - Stress test in 03/2018 was a normal and low risk study  - No further ischemic workup needed at this time   HTN  - Continue metoprolol 25 mg daily  - BP has been decreasing since arrival, continue to monitor BP as patient is diuresed, may need to add additional medications prior to discharge   Acute on chronic diastolic heart failure  - Patient reports that she has been  fatigued and experiencing worsening SOB on exertion for the past 2 weeks.  - Most recent echocardiogram on 08/17/2020 showed EF 47-82%, grade I diastolic dysfunction  - CXR showed mild central pulmonary vascular congestion  - Likely that her SOB/fatigue are multifactorial with her heart failure, deconditioning, atrial fibrillation. Hopeful that patient will have some improvement in symptoms with diuresis as she had some pulmonary edema on imaging  - Repeat Echocardiogram  - Start lasix IV 40 mg BID  - Continue metoprolol  Atrial Fibrillation  - EKG and telemetry showed atrial fibrillation, rate is well controlled  - Continue  sotalol 120 mg BID, metoprolol 25 mg daily - coumadin dosed per pharmacy  - Her atrial fibrillation has been ongoing for several years, and patient has had reoccurrence after multiple cardioversion (2007, 2017) and has had break through episodes while on rhythm controlling medications. Patient's BP and HR are stable and HR is well controlled, so no need to pursue cardioversion at this time.    Risk Assessment/Risk Scores:     HEAR Score (for undifferentiated chest pain):  HEAR Score: 4  New York Heart Association (NYHA) Functional Class NYHA Class II  CHA2DS2-VASc Score = 5   This indicates a 7.2% annual risk of stroke. The patient's score is based upon: CHF History: 1 HTN History: 1 Diabetes History: 0 Stroke History: 0 Vascular Disease History: 0 Age Score: 2 Gender Score: 1     For questions or updates, please contact [place] Please consult [email or web address] for contact info under    Signed, Margie Billet, PA-C  03/30/2021 3:49 PM   Personally seen and examined. Agree with above.  76 year old patient known well to me with longstanding history of diastolic heart failure, paroxysmal/persistent atrial fibrillation previously on sotalol, discontinued Tikosyn due to financial concerns several years ago with EF of 50 to 55% most recently, 2016, 25 to 30%, here with atypical chest discomfort/sharp left arm pain that was fleeting.  Troponin reassuring.  She also has had shortness of breath when walking short distances NYHA class between 2 and 3.  She was also complaining of generalized malaise, fatigue, feeling poorly for the last 10 days.  "Feeling miserable all over ".  Denies any recent fevers chills nausea vomiting.  Lab work, fairly unremarkable, creatinine 1.13 ALT 12 troponin 7 white count 7.7.  Chest x-ray personally reviewed shows very minimal pulmonary vascular congestion.  On exam appears comfortable laying flat, normal-appearing respiratory effort.  She was upset  that her urinary catheter had leaked earlier.  Minimal crackles heard at bases.  Irregularly irregular.  Telemetry personally reviewed shows atrial fibrillation under good rate control.  Prior EKG in clinic showed sinus rhythm interestingly.  Chest pain - No further ischemic evaluation necessary.  Sharp atypical discomfort.  Troponin is normal.  EKG without any signs of ischemia, prior stress test in 2020 was low risk.  Acute on chronic diastolic heart failure - May be related to atrial fibrillation but she has had longstanding issues with shortness of breath etc.  Likely multifactorial from deconditioning, morbid obesity, BMI is 45. -We are starting Lasix 40 mg IV twice daily.  She takes 40 mg p.o. daily at home.  Atrial fibrillation - Continue with sotalol.  Failed Tikosyn due to cost. -Okay to continue with metoprolol succinate 25 mg. -Cardioversion does not need to be performed.  Anticoagulation - On warfarin.  Previously I believe Eliquis was a cost issue.  Generalized fatigue - Likely multifactorial as well.  [name],  MD

## 2021-03-30 NOTE — H&P (Signed)
History and Physical  [name]:[id number] DOB: Jun 10, 1945 DOA: 03/30/2021  PCP: Elisabeth Cara, PA-C Patient coming from: Home   I have personally briefly reviewed patient's old medical records in [place]   Chief Complaint: Chest pain, Dyspnea.   HPI: [name] is a 76 y.o. female past medical history significant for CAD status post PCI, chronic diastolic heart failure prior history of dilated cardiomyopathy ejection fraction returned to normal, paroxysmal A-fib on Coumadin, hypothyroidism, hypertension, morbid obesity who presents complaining of shortness of breath for the last 3 days prior to admission.  Noticed some lower extremity edema.  She also presented for evaluation of chest pain, left side radiating to the right arm associated with right arm numbness.  She describes the pain as sharp, like a lightening.  Chest pain improved after she took nitroglycerin.  She was  in her recliner when she started to have chest pain.   In the ED: She was mildly tachycardic, sodium 139, potassium 3.7, creatinine 1.1, liver function test normal, hemoglobin 14, white blood cell 7.7 troponin 7, INR 1.9, COVID PCR negative.  Chest x-ray: Stable cardiomegaly with mild central pulmonary vascular congestion.  EKG: A-fib, no significant ST elevation.   Review of Systems: All systems reviewed and apart from history of presenting illness, are negative.  Past Medical History:  Diagnosis Date   CAD (coronary artery disease)    a. LHC 3/17: mLAD 80, D1 50, pRCA 30, EF 25-35%; PCI: 3.5 x 16 mm Synergy DES to mLAD // Myoview 08/2018: EF 54, normal perfusion; Low Risk   Chronic diastolic CHF 7/79/3903   Echocardiogram 08/2018: EF 55-60, mild septal basal hypertrophy, Gr 2 DD, elevated LVEDP, mild LAE, mild AI   Dilated cardiomyopathy >> EF returned to normal in NSR    Probable Tachycardia Mediated // a. Echo 12/16: Mild concentric LVH, EF 25-30%, anteroseptal akinesis,  inferoseptal akinesis, anterior akinesis, trivial AI, mild MR, moderate LAE, trivial TR, trivial PI, PASP 33 mmHg  //  b. Echo 2/17: Mild LVH, EF 25-30%, diffuse HK, mild LAE  //  c. Echo 7/17: mild LVH, EF 50-55%, no RWMA, trivial AI, mild to mod LAE, mild RAE   Febrile seizure ([place]) 2004   "during TTP thing; cause my temp was 106"   High cholesterol    History of blood transfusion 2004   "several; related to TTP"   Hypertension    Hypothyroidism    Morbid obesity ([place]) 01/07/2013   Osteoarthritis of back    Paroxysmal atrial fibrillation ([place]) 08/01/2011   a. 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   Past Surgical History:  Procedure Laterality Date   ABDOMINAL EXPLORATION SURGERY     "opened me up 2-3 times for blocked intestines"   APPENDECTOMY     BACK SURGERY     BASAL CELL CARCINOMA EXCISION Right    "neck"   CARDIAC CATHETERIZATION N/A 04/17/2015   Procedure: Left Heart Cath and Coronary Angiography;  Surgeon: Jettie Booze, MD;  Location: [facility];  Service: Cardiovascular;  Laterality: N/A;   CARDIAC CATHETERIZATION  04/17/2015   Procedure: Coronary Stent Intervention;  Surgeon: Jettie Booze, MD;  Location: [facility];  Service: Cardiovascular;;   CARDIOVERSION  06/2002; 03/2005; 05/2005   /  notes 06/16/2010   CARDIOVERSION N/A 10/03/2015   Procedure: CARDIOVERSION;  Surgeon: Deboraha Sprang, MD;  Location: [facility];  Service: Cardiovascular;  Laterality: N/A;   CATARACT EXTRACTION W/ INTRAOCULAR LENS  IMPLANT, BILATERAL Bilateral    CHOLECYSTECTOMY OPEN     CORONARY ANGIOPLASTY     DILATION AND CURETTAGE OF UTERUS     S/P "miscarriage"   FOOT SURGERY Right    "for nerve damage"   SALIVARY STONE REMOVAL Left    "had tumor wrapped around it"   SPLENECTOMY,  TOTAL     Archie Endo 06/16/2010   TEE WITHOUT CARDIOVERSION N/A 09/30/2015   Procedure: TRANSESOPHAGEAL ECHOCARDIOGRAM (TEE);  Surgeon: Skeet Latch, MD;  Location: [facility];  Service: Cardiovascular;  Laterality: N/A;   THORACIC DISCECTOMY  05/2002   THYROIDECTOMY, PARTIAL  X 2   "grew back"   TONSILLECTOMY     TUBAL LIGATION     TUMOR EXCISION     "benign tumor in my neck"   VAGINAL HYSTERECTOMY     Social History:  reports that she has never smoked. She has never used smokeless tobacco. She reports that she does not drink alcohol and does not use drugs.   Allergies  Allergen Reactions   Ceftriaxone Rash and Other (See Comments)    "TTP"/Was hospitalized and in a coma for 60 days, per patient (Thrombotic Thrombocytopenic Purpura)   Penicillins Hives, Rash and Other (See Comments)    Cannot tolerate any "-CILLINS"; causes welts!! Has patient had a PCN reaction causing immediate rash, facial/tongue/throat swelling, SOB or lightheadedness with hypotension: Yes Has patient had a PCN reaction causing severe rash involving mucus membranes or skin necrosis: No Has patient had a PCN reaction that required hospitalization No Has patient had a PCN reaction occurring within the last 10 years: No If all of the above answers are "NO", then may proceed with Cephalosporin use.   Levofloxacin Other (See Comments)    Thrombocytopenia     Levofloxacin Other (See Comments)    Dropped blood platelets extremely low   Oxycodone Itching   Tape Other (See Comments)    Prefers paper or cloth tape Other reaction(s): Other (See Comments) Prefers paper or cloth tape Other reaction(s): Other (See Comments) Prefers paper or cloth tape   Alendronate Rash   Aspirin Other (See Comments)    Patient stated she is unable to take due to blood thinner    Oxycodone-Acetaminophen Itching   Penicillin G Rash    Family History  Problem Relation Age of Onset   Heart attack Mother    Cancer Sister     Cancer Brother      Prior to Admission medications   Medication Sig Start Date End Date Taking? Authorizing Provider  Cholecalciferol (VITAMIN D3) 2000 units capsule Take 2,000 Units by mouth every morning.    [provider]  furosemide (LASIX) 40 MG tablet TAKE 1 TABLET BY MOUTH EVERY DAY 01/05/21   Jerline Pain, MD  isosorbide mononitrate (IMDUR) 30 MG 24 hr tablet TAKE 1 TABLET BY MOUTH EVERY DAY 07/28/20   Jerline Pain, MD  levothyroxine (SYNTHROID, LEVOTHROID) 50 MCG tablet Take 50 mcg by mouth daily before breakfast.    [provider]  metoprolol succinate (TOPROL-XL) 25 MG 24 hr tablet TAKE 1 TABLET BY MOUTH DAILY. TAKE WITH OR IMMEDIATELY FOLLOWING A MEAL. 01/05/21   Jerline Pain, MD  nitroGLYCERIN (NITROSTAT) 0.4 MG SL tablet PLACE 1 TABLET (0.4 MG TOTAL) UNDER THE TONGUE EVERY 5 (  FIVE) MINUTES AS NEEDED FOR CHEST PAIN. 09/24/18   Burtis Junes, NP  potassium chloride (KLOR-CON) 10 MEQ tablet Take 10 mEq by mouth daily.    [provider]  sotalol (BETAPACE) 120 MG tablet TAKE 1 TABLET BY MOUTH 2 TIMES DAILY. 11/19/20   Jerline Pain, MD  warfarin (COUMADIN) 5 MG tablet Take 1 tablet by mouth daily except 1.5 tablets on Tuesdays and Fridays or as directed by Anticoagulation Clinic. 11/19/20   Jerline Pain, MD   Physical Exam: Vitals:   03/30/21 1200 03/30/21 1230 03/30/21 1330 03/30/21 1530  BP: (!) 148/100 (!) 154/91 (!) 143/102 (!) 152/89  Pulse: 77 76 83 65  Resp: 16 (!) 23 20 18   Temp:      TempSrc:      SpO2: 96% 96% 96% 98%  Weight:      Height:        General exam: Obese, lying comfortably supine on the gurney in no obvious distress. Head, eyes and ENT: Nontraumatic and normocephalic. Pupils equally reacting to light and accommodation. Oral mucosa moist. Neck: Supple. No JVD, carotid bruit or thyromegaly. Lymphatics: No lymphadenopathy. Respiratory system: BL crackles, decreased Breath sounds.  Cardiovascular system: S1 and S2  heard, IRR.  Gastrointestinal system: Abdomen is nondistended, soft and nontender. Normal bowel sounds heard. No organomegaly or masses appreciated. Central nervous system: Alert and oriented. No focal neurological deficits. Extremities: Symmetric 5 x 5 power. Plus 2 edema Skin: No rashes or acute findings. Musculoskeletal system: Negative exam. Psychiatry: Pleasant and cooperative.   Labs on Admission:  Basic Metabolic Panel: Recent Labs  Lab 03/30/21 1117  NA 139  K 3.7  CL 101  CO2 29  GLUCOSE 108*  BUN 11  CREATININE 1.13*  CALCIUM 8.5*   Liver Function Tests: Recent Labs  Lab 03/30/21 1117  AST 18  ALT 12  ALKPHOS 58  BILITOT 0.5  PROT 6.0*  ALBUMIN 3.0*   No results for input(s): LIPASE, AMYLASE in the last 168 hours. No results for input(s): AMMONIA in the last 168 hours. CBC: Recent Labs  Lab 03/30/21 1117  WBC 7.7  NEUTROABS 3.6  HGB 14.4  HCT 44.5  MCV 91.9  PLT 491*   Cardiac Enzymes: No results for input(s): CKTOTAL, CKMB, CKMBINDEX, TROPONINI in the last 168 hours.  BNP (last 3 results) No results for input(s): PROBNP in the last 8760 hours. CBG: No results for input(s): GLUCAP in the last 168 hours.  Radiological Exams on Admission: DG Chest 2 View  Result Date: 03/30/2021 CLINICAL DATA:  Chest pain, shortness of breath. EXAM: CHEST - 2 VIEW COMPARISON:  August 15, 2020. FINDINGS: Stable cardiomegaly with mild central pulmonary vascular congestion. No definite consolidative process is noted. Bony thorax is unremarkable. IMPRESSION: Stable cardiomegaly with mild central pulmonary vascular congestion. Electronically Signed   By: Marijo Conception M.D.   On: 03/30/2021 11:43    EKG: Independently reviewed. A fib, no st elevation.   Assessment/Plan Principal Problem:   Chest pain Active Problems:   Hypertension   Hypothyroidism (acquired)   Paroxysmal atrial fibrillation (HCC)   Acute on chronic heart failure with preserved ejection fraction  (HCC)   CAD (coronary artery disease)   1-Chest pain, history of CAD: Reports chest pain radiating to the left arm associated numbness.  History of shortness of breath for the last 3 days Troponin negative, continue to cycle enzymes. Nitroglycerin as needed for pain Received a  dose of aspirin.  Check ECHO  Cardiology consulted.  Continue with Imdur  2-Acute on chronic diastolic heart failure exacerbation: Patient presented with shortness of breath over the last 3 days, she has lower extremity edema.  Chest x-ray: Central pulmonary vascular congestion. Admit for heart failure exacerbation, admit to cardiac telemetry. Started Lasix 40 mg IV twice daily Follow echo Cardiology  consulted and following   3-Paroxysmal A-fib: Continue with amiodarone. Continue with metoprolol and sotalol  4-Hypothyroidism: Continue with Synthroid     DVT Prophylaxis: Coumadin  Code Status: Full code Family Communication: care discussed with patient.  Disposition Plan: admit for observation for diuresis and evaluation fo chest pain   Time spent: 75 minutes.   Elmarie Shiley MD [facility]   03/30/2021, 4:33 PM

## 2021-03-30 NOTE — ED Provider Notes (Signed)
[facility] EMERGENCY DEPARTMENT Provider Note  CSN: [id number] Arrival date & time: 03/30/21 1043  Chief Complaint(s) Chest Pain  HPI [name] is a 76 y.o. female with PMH CAD status post DES placement, CHF, A-fib on warfarin, TTP who presents to the emergency department for evaluation of chest pain shortness of breath.  Patient states that she has had increasing shortness of breath over the last 48 hours, but today had substernal chest pain that radiated down the left arm.  She took her home nitroglycerin and her pain has since improved.  She arrives complaining of dizziness, lightheadedness and persistent shortness of breath.  Denies abdominal pain, nausea, vomiting or other systemic symptoms.   Chest Pain Associated symptoms: shortness of breath    Past Medical History Past Medical History:  Diagnosis Date   CAD (coronary artery disease)    a. LHC 3/17: mLAD 80, D1 50, pRCA 30, EF 25-35%; PCI: 3.5 x 16 mm Synergy DES to mLAD // Myoview 08/2018: EF 54, normal perfusion; Low Risk   Chronic diastolic CHF 3/66/4403   Echocardiogram 08/2018: EF 55-60, mild septal basal hypertrophy, Gr 2 DD, elevated LVEDP, mild LAE, mild AI   Dilated cardiomyopathy >> EF returned to normal in NSR    Probable Tachycardia Mediated // a. Echo 12/16: Mild concentric LVH, EF 25-30%, anteroseptal akinesis, inferoseptal akinesis, anterior akinesis, trivial AI, mild MR, moderate LAE, trivial TR, trivial PI, PASP 33 mmHg  //  b. Echo 2/17: Mild LVH, EF 25-30%, diffuse HK, mild LAE  //  c. Echo 7/17: mild LVH, EF 50-55%, no RWMA, trivial AI, mild to mod LAE, mild RAE   Febrile seizure ([place]) 2004   "during TTP thing; cause my temp was 106"   High cholesterol    History of blood transfusion 2004   "several; related to TTP"   Hypertension    Hypothyroidism    Morbid obesity ([place]) 01/07/2013   Osteoarthritis of back    Paroxysmal atrial fibrillation ([place]) 08/01/2011   a. 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   Patient Active Problem List   Diagnosis Date Noted   Chest pain 03/30/2021   Renal insufficiency 08/16/2020   Acute on chronic diastolic CHF (congestive heart failure) ([place]) 08/15/2020   Cough 10/16/2017   History of splenectomy 07/20/2017   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   Mycoplasma pneumonia 07/06/2017   Postural dizziness with presyncope    Bronchitis 07/05/2017   Chronic diastolic CHF 47/42/5956   Hyperglycemia 09/23/2016   Age-related osteoporosis without current pathological fracture 08/27/2015   Vitamin D deficiency 08/27/2015   Hyperlipidemia 08/06/2015   Persistent atrial fibrillation ([place]) 04/18/2015   CAD (coronary artery disease) 04/18/2015   Cardiomyopathy, ischemic 04/18/2015   PAF (paroxysmal atrial fibrillation) ([place]) 04/18/2015   Hypertensive heart disease with heart failure ([place]) 04/15/2015   Anxiety 04/15/2015   Encounter for therapeutic drug monitoring 01/21/2015   Dyspnea on exertion 01/05/2015   Chronic anticoagulation 05/05/2014   Primary insomnia 05/05/2014   Long-term use of high-risk medication 02/05/2014   Hypothyroidism 06/14/2013   Localized osteoporosis without current pathological fracture 06/14/2013   Low back pain 06/14/2013   Morbid obesity ([place]) 01/07/2013   Polypharmacy 01/07/2013   Benign essential HTN 08/01/2011   Atrial fibrillation ([place]) 08/01/2011  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 Authorizing Provider  Cholecalciferol (VITAMIN D3) 2000 units capsule Take 2,000 Units by mouth every morning.    [provider]  furosemide (LASIX) 40 MG tablet TAKE 1 TABLET BY  MOUTH EVERY DAY 01/05/21   Jerline Pain, MD  isosorbide mononitrate (IMDUR) 30 MG 24 hr tablet TAKE 1 TABLET BY MOUTH EVERY DAY 07/28/20   Jerline Pain, MD  levothyroxine (SYNTHROID, LEVOTHROID) 50 MCG tablet Take 50 mcg by mouth daily before breakfast.    [provider]  metoprolol succinate (TOPROL-XL) 25 MG 24 hr tablet TAKE 1 TABLET BY MOUTH DAILY. TAKE WITH OR IMMEDIATELY FOLLOWING A MEAL. 01/05/21   Jerline Pain, MD  nitroGLYCERIN (NITROSTAT) 0.4 MG SL tablet PLACE 1 TABLET (0.4 MG TOTAL) UNDER THE TONGUE EVERY 5 (FIVE) MINUTES AS NEEDED FOR CHEST PAIN. 09/24/18   Burtis Junes, NP  potassium chloride (KLOR-CON) 10 MEQ tablet Take 10 mEq by mouth daily.    [provider]  sotalol (BETAPACE) 120 MG tablet TAKE 1 TABLET BY MOUTH 2 TIMES DAILY. 11/19/20   Jerline Pain, MD  warfarin (COUMADIN) 5 MG tablet Take 1 tablet by mouth daily except 1.5 tablets on Tuesdays and Fridays or as directed by Anticoagulation Clinic. 11/19/20   Jerline Pain, MD                                                                                                                                    Past Surgical History Past Surgical History:  Procedure Laterality Date   ABDOMINAL EXPLORATION SURGERY     "opened me up 2-3 times for blocked intestines"   APPENDECTOMY     BACK SURGERY     BASAL CELL CARCINOMA EXCISION Right    "neck"   CARDIAC CATHETERIZATION N/A 04/17/2015   Procedure: Left Heart Cath and Coronary Angiography;  Surgeon: Jettie Booze, MD;  Location: [facility];  Service: Cardiovascular;  Laterality: N/A;   CARDIAC CATHETERIZATION  04/17/2015   Procedure: Coronary Stent Intervention;  Surgeon: Jettie Booze, MD;  Location: [facility];  Service: Cardiovascular;;   CARDIOVERSION  06/2002; 03/2005; 05/2005   Archie Endo 06/16/2010   CARDIOVERSION N/A 10/03/2015   Procedure: CARDIOVERSION;  Surgeon: Deboraha Sprang, MD;  Location: [facility];  Service:  Cardiovascular;  Laterality: N/A;   CATARACT EXTRACTION W/ INTRAOCULAR LENS  IMPLANT, BILATERAL Bilateral    CHOLECYSTECTOMY OPEN     CORONARY ANGIOPLASTY     DILATION AND CURETTAGE OF UTERUS     S/P "miscarriage"   FOOT SURGERY Right    "for nerve damage"   SALIVARY STONE REMOVAL Left    "had tumor wrapped around it"   SPLENECTOMY, TOTAL     Archie Endo 06/16/2010   TEE WITHOUT CARDIOVERSION N/A 09/30/2015  Procedure: TRANSESOPHAGEAL ECHOCARDIOGRAM (TEE);  Surgeon: Skeet Latch, MD;  Location: [facility];  Service: Cardiovascular;  Laterality: N/A;   THORACIC DISCECTOMY  05/2002   THYROIDECTOMY, PARTIAL  X 2   "grew back"   TONSILLECTOMY     TUBAL LIGATION     TUMOR EXCISION     "benign tumor in my neck"   VAGINAL HYSTERECTOMY     Family History Family History  Problem Relation Age of Onset   Heart attack Mother    Cancer Sister    Cancer Brother     Social History Social History   Tobacco Use   Smoking status: Never   Smokeless tobacco: Never  Vaping Use   Vaping Use: Never used  Substance Use Topics   Alcohol use: No   Drug use: No   Allergies Ceftriaxone, Penicillins, Levofloxacin, Levofloxacin, Oxycodone, Tape, Alendronate, Aspirin, Oxycodone-acetaminophen, and Penicillin g  Review of Systems Review of Systems  Respiratory:  Positive for shortness of breath.   Cardiovascular:  Positive for chest pain.   Physical Exam Vital Signs  I have reviewed the triage vital signs BP (!) 152/89    Pulse 65    Temp 98.6 F (37 C) (Oral)    Resp 18    Ht 5\' 4"  (1.626 m)    Wt 121.2 kg    SpO2 98%    BMI 45.86 kg/m   Physical Exam Vitals and nursing note reviewed.  Constitutional:      General: She is not in acute distress.    Appearance: She is well-developed.  HENT:     Head: Normocephalic and atraumatic.  Eyes:     Conjunctiva/sclera: Conjunctivae normal.  Cardiovascular:     Rate and Rhythm: Normal rate. Rhythm irregular.     Heart sounds: No murmur  heard. Pulmonary:     Effort: Pulmonary effort is normal. No respiratory distress.     Breath sounds: Normal breath sounds.  Abdominal:     Palpations: Abdomen is soft.     Tenderness: There is no abdominal tenderness.  Musculoskeletal:        General: No swelling.     Cervical back: Neck supple.  Skin:    General: Skin is warm and dry.     Capillary Refill: Capillary refill takes less than 2 seconds.  Neurological:     Mental Status: She is alert.  Psychiatric:        Mood and Affect: Mood normal.    ED Results and Treatments Labs (all labs ordered are listed, but only abnormal results are displayed) Labs Reviewed  COMPREHENSIVE METABOLIC PANEL - Abnormal; Notable for the following components:      Result Value   Glucose, Bld 108 (*)    Creatinine, Ser 1.13 (*)    Calcium 8.5 (*)    Total Protein 6.0 (*)    Albumin 3.0 (*)    GFR, Estimated 51 (*)    All other components within normal limits  CBC WITH DIFFERENTIAL/PLATELET - Abnormal; Notable for the following components:   RDW 15.9 (*)    Platelets 491 (*)    All other components within normal limits  PROTIME-INR - Abnormal; Notable for the following components:   Prothrombin Time 21.8 (*)    INR 1.9 (*)    All other components within normal limits  RESP PANEL BY RT-PCR (FLU A&B, COVID) ARPGX2  TROPONIN I (HIGH SENSITIVITY)  TROPONIN I (HIGH SENSITIVITY)  Radiology DG Chest 2 View  Result Date: 03/30/2021 CLINICAL DATA:  Chest pain, shortness of breath. EXAM: CHEST - 2 VIEW COMPARISON:  August 15, 2020. FINDINGS: Stable cardiomegaly with mild central pulmonary vascular congestion. No definite consolidative process is noted. Bony thorax is unremarkable. IMPRESSION: Stable cardiomegaly with mild central pulmonary vascular congestion. Electronically Signed   By: Marijo Conception M.D.   On: 03/30/2021  11:43    Pertinent labs & imaging results that were available during my care of the patient were reviewed by me and considered in my medical decision making (see MDM for details).  Medications Ordered in ED Medications  aspirin tablet 325 mg (325 mg Oral Given 03/30/21 1231)  furosemide (LASIX) injection 40 mg (has no administration in time range)  lactated ringers bolus 500 mL (500 mLs Intravenous Bolus 03/30/21 1231)                                                                                                                                     Procedures Procedures  (including critical care time)  Medical Decision Making / ED Course   This patient presents to the ED for concern of chest pain shortness of breath, this involves an extensive number of treatment options, and is a complaint that carries with it a high risk of complications and morbidity.  The differential diagnosis includes ACS, pneumonia, PE, electrolyte abnormality  MDM: Patient seen emergency department for evaluation of chest pain shortness of breath.  Physical exam reveals lower extremity pitting edema, faint rales at the bases but is otherwise unremarkable.  ECG with atrial fibrillation but no ST elevations or depressions.  Laboratory evaluation largely unremarkable with INR almost therapeutic at 1.9, COVID and flu negative.  Troponin unremarkable.  Chest x-ray with mild central venous congestion.  Aspirin given.  On reevaluation, patient states she is feeling woozy after nitroglycerin administration and thus a very small bolus of 500 cc was given to the patient.  Cardiology evaluated the patient at bedside after being consulted who recommended medical admission and diuresis.  Lasix begun.  Patient admitted to medicine.   Additional history obtained:  -External records from outside source obtained and reviewed including: Chart review including previous notes, labs, imaging, consultation notes   Lab Tests: -I  ordered, reviewed, and interpreted labs.   The pertinent results include:   Labs Reviewed  COMPREHENSIVE METABOLIC PANEL - Abnormal; Notable for the following components:      Result Value   Glucose, Bld 108 (*)    Creatinine, Ser 1.13 (*)    Calcium 8.5 (*)    Total Protein 6.0 (*)    Albumin 3.0 (*)    GFR, Estimated 51 (*)    All other components within normal limits  CBC WITH DIFFERENTIAL/PLATELET - Abnormal; Notable for the following components:   RDW 15.9 (*)    Platelets 491 (*)    All other components within  normal limits  PROTIME-INR - Abnormal; Notable for the following components:   Prothrombin Time 21.8 (*)    INR 1.9 (*)    All other components within normal limits  RESP PANEL BY RT-PCR (FLU A&B, COVID) ARPGX2  TROPONIN I (HIGH SENSITIVITY)  TROPONIN I (HIGH SENSITIVITY)      EKG   EKG Interpretation  Date/Time:  Tuesday March 30 2021 12:25:50 EST Ventricular Rate:  76 PR Interval:    QRS Duration: 93 QT Interval:  374 QTC Calculation: 421 R Axis:   108 Text Interpretation: Atrial fibrillation Ventricular premature complex Right axis deviation Low voltage, precordial leads Confirmed by [place] ([id number]) on 03/30/2021 12:29:34 PM         Imaging Studies ordered: I ordered imaging studies including CXR I independently visualized and interpreted imaging. I agree with the radiologist interpretation   Medicines ordered and prescription drug management: Meds ordered this encounter  Medications   aspirin tablet 325 mg   DISCONTD: lactated ringers bolus 1,000 mL   lactated ringers bolus 500 mL   furosemide (LASIX) injection 40 mg    -I have reviewed the patients home medicines and have made adjustments as needed  Critical interventions none  Consultations Obtained: I requested consultation with the cardiologist,  and discussed lab and imaging findings as well as pertinent plan - they recommend: Medical admission   Cardiac Monitoring: The  patient was maintained on a cardiac monitor.  I personally viewed and interpreted the cardiac monitored which showed an underlying rhythm of: Atrial fibrillation, rate controlled  Social Determinants of Health:  Factors impacting patients care include: None   Reevaluation: After the interventions noted above, I reevaluated the patient and found that they have :improved  Co morbidities that complicate the patient evaluation  Past Medical History:  Diagnosis Date   CAD (coronary artery disease)    a. LHC 3/17: mLAD 80, D1 50, pRCA 30, EF 25-35%; PCI: 3.5 x 16 mm Synergy DES to mLAD // Myoview 08/2018: EF 54, normal perfusion; Low Risk   Chronic diastolic CHF 5/88/5027   Echocardiogram 08/2018: EF 55-60, mild septal basal hypertrophy, Gr 2 DD, elevated LVEDP, mild LAE, mild AI   Dilated cardiomyopathy >> EF returned to normal in NSR    Probable Tachycardia Mediated // a. Echo 12/16: Mild concentric LVH, EF 25-30%, anteroseptal akinesis, inferoseptal akinesis, anterior akinesis, trivial AI, mild MR, moderate LAE, trivial TR, trivial PI, PASP 33 mmHg  //  b. Echo 2/17: Mild LVH, EF 25-30%, diffuse HK, mild LAE  //  c. Echo 7/17: mild LVH, EF 50-55%, no RWMA, trivial AI, mild to mod LAE, mild RAE   Febrile seizure ([place]) 2004   "during TTP thing; cause my temp was 106"   High cholesterol    History of blood transfusion 2004   "several; related to TTP"   Hypertension    Hypothyroidism    Morbid obesity ([place]) 01/07/2013   Osteoarthritis of back    Paroxysmal atrial fibrillation ([place]) 08/01/2011   a. 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

## 2021-03-30 NOTE — Progress Notes (Signed)
ANTICOAGULATION CONSULT NOTE - Initial Consult  Pharmacy Consult for Warfarin Indication: atrial fibrillation  Allergies  Allergen Reactions   Ceftriaxone Rash and Other (See Comments)    "TTP"/Was hospitalized and in a coma for 60 days, per patient (Thrombotic Thrombocytopenic Purpura)   Penicillins Hives, Rash and Other (See Comments)    Cannot tolerate any "-CILLINS"; causes welts!! Has patient had a PCN reaction causing immediate rash, facial/tongue/throat swelling, SOB or lightheadedness with hypotension: Yes Has patient had a PCN reaction causing severe rash involving mucus membranes or skin necrosis: No Has patient had a PCN reaction that required hospitalization No Has patient had a PCN reaction occurring within the last 10 years: No If all of the above answers are "NO", then may proceed with Cephalosporin use.   Levofloxacin Other (See Comments)    Thrombocytopenia     Levofloxacin Other (See Comments)    Dropped blood platelets extremely low   Oxycodone Itching   Tape Other (See Comments)    Prefers paper or cloth tape Other reaction(s): Other (See Comments) Prefers paper or cloth tape Other reaction(s): Other (See Comments) Prefers paper or cloth tape   Alendronate Rash   Aspirin Other (See Comments)    Patient stated she is unable to take due to blood thinner    Oxycodone-Acetaminophen Itching   Penicillin G Rash    Patient Measurements: Height: 5\' 4"  (162.6 cm) Weight: 121.2 kg (267 lb 3.2 oz) IBW/kg (Calculated) : 54.7  Vital Signs: Temp: 98.6 F (37 C) (02/28 1132) Temp Source: Oral (02/28 1132) BP: 152/89 (02/28 1530) Pulse Rate: 65 (02/28 1530)  Labs: Recent Labs    03/30/21 1117 03/30/21 1242  HGB 14.4  --   HCT 44.5  --   PLT 491*  --   LABPROT  --  21.8*  INR  --  1.9*  CREATININE 1.13*  --   TROPONINIHS 7  --     Estimated Creatinine Clearance: 55.2 mL/min (A) (by C-G formula based on SCr of 1.13 mg/dL (H)).   Medical  History: Past Medical History:  Diagnosis Date   CAD (coronary artery disease)    a. LHC 3/17: mLAD 80, D1 50, pRCA 30, EF 25-35%; PCI: 3.5 x 16 mm Synergy DES to mLAD // Myoview 08/2018: EF 54, normal perfusion; Low Risk   Chronic diastolic CHF 2/94/7654   Echocardiogram 08/2018: EF 55-60, mild septal basal hypertrophy, Gr 2 DD, elevated LVEDP, mild LAE, mild AI   Dilated cardiomyopathy >> EF returned to normal in NSR    Probable Tachycardia Mediated // a. Echo 12/16: Mild concentric LVH, EF 25-30%, anteroseptal akinesis, inferoseptal akinesis, anterior akinesis, trivial AI, mild MR, moderate LAE, trivial TR, trivial PI, PASP 33 mmHg  //  b. Echo 2/17: Mild LVH, EF 25-30%, diffuse HK, mild LAE  //  c. Echo 7/17: mild LVH, EF 50-55%, no RWMA, trivial AI, mild to mod LAE, mild RAE   Febrile seizure ([place]) 2004   "during TTP thing; cause my temp was 106"   High cholesterol    History of blood transfusion 2004   "several; related to TTP"   Hypertension    Hypothyroidism    Morbid obesity ([place]) 01/07/2013   Osteoarthritis of back    Paroxysmal atrial fibrillation ([place]) 08/01/2011   a. started on Sotalol during 10/2015 admission (Tikosyn too expensive)   Pneumonia ?1999   Scoliosis    Thyroid nodule    "grew back after 2 partial thyroid surgeries" (09/29/2015)   TTP (thrombotic  thrombocytopenic purpura) ([place]) 05/03/2011    Assessment: 77 YOF presenting with CP, hx of afib on warfarin PTA, last dose taken 2/28, INR on admission 1.9 slightly subtherapeutic  PTA dosing: 5mg  daily, except 7.5mg  Tuesdays  Goal of Therapy:  INR 2-3 Monitor platelets by anticoagulation protocol: Yes   Plan:  Dose taken PTA today Daily INR, s/s bleeding  Bertis Ruddy, PharmD Clinical Pharmacist ED Pharmacist Phone # [phone] 03/30/2021 5:19 PM

## 2021-03-31 ENCOUNTER — Encounter (HOSPITAL_COMMUNITY): Payer: Self-pay | Admitting: Internal Medicine

## 2021-03-31 ENCOUNTER — Observation Stay (HOSPITAL_BASED_OUTPATIENT_CLINIC_OR_DEPARTMENT_OTHER): Payer: Medicare Other

## 2021-03-31 DIAGNOSIS — I10 Essential (primary) hypertension: Secondary | ICD-10-CM | POA: Diagnosis not present

## 2021-03-31 DIAGNOSIS — R0789 Other chest pain: Secondary | ICD-10-CM | POA: Diagnosis not present

## 2021-03-31 DIAGNOSIS — R079 Chest pain, unspecified: Secondary | ICD-10-CM

## 2021-03-31 DIAGNOSIS — I5033 Acute on chronic diastolic (congestive) heart failure: Secondary | ICD-10-CM | POA: Diagnosis not present

## 2021-03-31 DIAGNOSIS — E039 Hypothyroidism, unspecified: Secondary | ICD-10-CM

## 2021-03-31 DIAGNOSIS — E876 Hypokalemia: Secondary | ICD-10-CM

## 2021-03-31 DIAGNOSIS — I2510 Atherosclerotic heart disease of native coronary artery without angina pectoris: Secondary | ICD-10-CM | POA: Diagnosis not present

## 2021-03-31 LAB — COMPREHENSIVE METABOLIC PANEL
ALT: 12 U/L (ref 0–44)
AST: 19 U/L (ref 15–41)
Albumin: 3 g/dL — ABNORMAL LOW (ref 3.5–5.0)
Alkaline Phosphatase: 46 U/L (ref 38–126)
Anion gap: 10 (ref 5–15)
BUN: 9 mg/dL (ref 8–23)
CO2: 31 mmol/L (ref 22–32)
Calcium: 8.3 mg/dL — ABNORMAL LOW (ref 8.9–10.3)
Chloride: 99 mmol/L (ref 98–111)
Creatinine, Ser: 0.88 mg/dL (ref 0.44–1.00)
GFR, Estimated: >60 mL/min (ref >60)
Glucose, Bld: 101 mg/dL — ABNORMAL HIGH (ref 70–99)
Potassium: 3.3 mmol/L — ABNORMAL LOW (ref 3.5–5.1)
Sodium: 140 mmol/L (ref 135–145)
Total Bilirubin: 1.1 mg/dL (ref 0.3–1.2)
Total Protein: 6 g/dL — ABNORMAL LOW (ref 6.5–8.1)

## 2021-03-31 LAB — CBC
HCT: 45.6 % (ref 36.0–46.0)
Hemoglobin: 15.2 g/dL — ABNORMAL HIGH (ref 12.0–15.0)
MCH: 30.3 pg (ref 26.0–34.0)
MCHC: 33.3 g/dL (ref 30.0–36.0)
MCV: 91 fL (ref 80.0–100.0)
Platelets: 479 10*3/uL — ABNORMAL HIGH (ref 150–400)
RBC: 5.01 MIL/uL (ref 3.87–5.11)
RDW: 15.9 % — ABNORMAL HIGH (ref 11.5–15.5)
WBC: 8.5 10*3/uL (ref 4.0–10.5)
nRBC: 0 % (ref 0.0–0.2)

## 2021-03-31 LAB — ECHOCARDIOGRAM COMPLETE
AR max vel: 1.53 cm2
AV Area VTI: 1.39 cm2
AV Area mean vel: 1.48 cm2
AV Mean grad: 6 mmHg
AV Peak grad: 9.6 mmHg
Ao pk vel: 1.55 m/s
Area-P 1/2: 2.56 cm2
Height: 64 in
MV VTI: 2.01 cm2
P 1/2 time: 679 msec
S' Lateral: 2.9 cm
Weight: 4275.16 oz

## 2021-03-31 LAB — PROTIME-INR
INR: 2.1 — ABNORMAL HIGH (ref 0.8–1.2)
Prothrombin Time: 23.3 seconds — ABNORMAL HIGH (ref 11.4–15.2)

## 2021-03-31 MED ORDER — WARFARIN SODIUM 5 MG PO TABS
5.0000 mg | ORAL_TABLET | Freq: Once | ORAL | Status: AC
  Administered 2021-03-31: 5 mg via ORAL
  Filled 2021-03-31 (×2): qty 1

## 2021-03-31 MED ORDER — POTASSIUM CHLORIDE CRYS ER 20 MEQ PO TBCR
40.0000 meq | EXTENDED_RELEASE_TABLET | Freq: Two times a day (BID) | ORAL | Status: AC
  Administered 2021-03-31 – 2021-04-01 (×4): 40 meq via ORAL
  Filled 2021-03-31 (×5): qty 2

## 2021-03-31 NOTE — Progress Notes (Signed)
Heart Failure Nurse Navigator Progress Note ? ?Navigation team following this hospitalization. Screening pending further testing/cardiac workup.  ? ?Echo pending ? ?Kevan Rosebush, RN, BSN, CHFN ?Heart Failure Navigator ?Heart & Vascular Care Navigation Team ? ?

## 2021-03-31 NOTE — Progress Notes (Signed)
Echocardiogram ?2D Echocardiogram has been performed. ? ?Arlyss Gandy ?03/31/2021, 1:17 PM ?

## 2021-03-31 NOTE — Progress Notes (Signed)
ANTICOAGULATION CONSULT NOTE ? ?Pharmacy Consult for Warfarin ?Indication: atrial fibrillation ? ?Patient Measurements: ?Height: 5\' 4"  (162.6 cm) ?Weight: 121.2 kg (267 lb 3.2 oz) ?IBW/kg (Calculated) : 54.7 ? ?Vital Signs: ?BP: 139/93 (03/01 1100) ?Pulse Rate: 79 (03/01 1100) ? ?Labs: ?Recent Labs  ?  03/30/21 ?1117 03/30/21 ?1242 03/30/21 ?1753 03/31/21 ?2620  ?HGB 14.4  --   --  15.2*  ?HCT 44.5  --   --  45.6  ?PLT 491*  --   --  479*  ?LABPROT  --  21.8*  --  23.3*  ?INR  --  1.9*  --  2.1*  ?CREATININE 1.13*  --   --  0.88  ?TROPONINIHS 7  --  7  --   ? ? ?Estimated Creatinine Clearance: 70.9 mL/min (by C-G formula based on SCr of 0.88 mg/dL). ? ?Assessment: ?25 YOF presenting with CP, hx of afib on warfarin PTA, last dose taken 2/28. INR today is therapeutic at 2.1.  ? ?PTA dosing: 5mg  daily, except 7.5mg  Tuesdays ? ?Goal of Therapy:  ?INR 2-3 ?Monitor platelets by anticoagulation protocol: Yes ?  ?Plan:  ?Warfarin 5mg  PO x 1 tonight ?Daily INR ? ?Salome Arnt, PharmD, BCPS ?Clinical Pharmacist ?Please see AMION for all pharmacy numbers ?03/31/2021 12:11 PM ? ? ? ? ?

## 2021-03-31 NOTE — ED Notes (Signed)
Changed pt linens and helped pt get back into bed. [place] is in place ?

## 2021-03-31 NOTE — ED Notes (Signed)
Got patient up into a recliner patient is sitting up eating with call bell in reach ?

## 2021-03-31 NOTE — Progress Notes (Signed)
Pt admitted to [facility], VS wnL and as per flow. Pt oriented to 6E processes. Pt familiar with the [facility] system. All questions and concerns addressed.  ?During our talk, Pt stated she felt like she was going to pass out and everything was becoming dark. Vitals stable, no fluctuation. Pt otherwise asymptomatic. No LOC. Pt helped back in bed, she was sitting at the edge eating her meal. MD notified. Call bell placed within reach. Will continue to monitor and maintain safety. ?

## 2021-03-31 NOTE — Progress Notes (Signed)
PROGRESS NOTE    POETRY CERRO  WSF:[id number] DOB: 02/27/1945 DOA: 03/30/2021 PCP: Elisabeth Cara, PA-C    Brief Narrative:  [name] is a 76 y.o. female past medical history significant for CAD status post PCI, chronic diastolic heart failure prior history of dilated cardiomyopathy ejection fraction returned to normal, paroxysmal A-fib on Coumadin, hypothyroidism, hypertension, morbid obesity who presents complaining of shortness of breath for the last 3 days prior to admission.  Noticed some lower extremity edema.  She also presented for evaluation of chest pain, left side radiating to the right arm associated with right arm numbness.  She describes the pain as sharp, like a lightening.  Chest pain improved   EKG with A-fib  Consultants:  cardiology  Procedures:   Antimicrobials:      Subjective: Little less sob. No cp.   Objective: Vitals:   03/31/21 0500 03/31/21 0600 03/31/21 0700 03/31/21 0910  BP: 128/77 136/81 (!) 134/95 121/71  Pulse: 72 85 84 85  Resp: 20 20    Temp:      TempSrc:      SpO2: 92% 92% 92%   Weight:      Height:        Intake/Output Summary (Last 24 hours) at 03/31/2021 1053 Last data filed at 03/31/2021 0316 Gross per 24 hour  Intake --  Output 4000 ml  Net -4000 ml   Filed Weights   03/30/21 1110  Weight: 121.2 kg    Examination: Calm, NAD Cta no w/r Irregular,s1/s2 no gallop Soft benign +bs No edema Awake and alert Mood and affect appropriate in current setting    Data Reviewed: I have personally reviewed following labs and imaging studies  CBC: Recent Labs  Lab 03/30/21 1117 03/31/21 0545  WBC 7.7 8.5  NEUTROABS 3.6  --   HGB 14.4 15.2*  HCT 44.5 45.6  MCV 91.9 91.0  PLT 491* 017*   Basic Metabolic Panel: Recent Labs  Lab 03/30/21 1117 03/31/21 0545  NA 139 140  K 3.7 3.3*  CL 101 99  CO2 29 31  GLUCOSE 108* 101*  BUN 11 9  CREATININE 1.13* 0.88  CALCIUM 8.5* 8.3*   GFR: Estimated  Creatinine Clearance: 70.9 mL/min (by C-G formula based on SCr of 0.88 mg/dL). Liver Function Tests: Recent Labs  Lab 03/30/21 1117 03/31/21 0545  AST 18 19  ALT 12 12  ALKPHOS 58 46  BILITOT 0.5 1.1  PROT 6.0* 6.0*  ALBUMIN 3.0* 3.0*   No results for input(s): LIPASE, AMYLASE in the last 168 hours. No results for input(s): AMMONIA in the last 168 hours. Coagulation Profile: Recent Labs  Lab 03/30/21 1242 03/31/21 0545  INR 1.9* 2.1*   Cardiac Enzymes: No results for input(s): CKTOTAL, CKMB, CKMBINDEX, TROPONINI in the last 168 hours. BNP (last 3 results) No results for input(s): PROBNP in the last 8760 hours. HbA1C: No results for input(s): HGBA1C in the last 72 hours. CBG: No results for input(s): GLUCAP in the last 168 hours. Lipid Profile: No results for input(s): CHOL, HDL, LDLCALC, TRIG, CHOLHDL, LDLDIRECT in the last 72 hours. Thyroid Function Tests: No results for input(s): TSH, T4TOTAL, FREET4, T3FREE, THYROIDAB in the last 72 hours. Anemia Panel: No results for input(s): VITAMINB12, FOLATE, FERRITIN, TIBC, IRON, RETICCTPCT in the last 72 hours. Sepsis Labs: No results for input(s): PROCALCITON, LATICACIDVEN in the last 168 hours.  Recent Results (from the past 240 hour(s))  Resp Panel by RT-PCR (Flu A&B, Covid) Nasopharyngeal Swab  Status: None   Collection Time: 03/30/21  1:10 PM   Specimen: Nasopharyngeal Swab; Nasopharyngeal(NP) swabs in vial transport medium  Result Value Ref Range Status   SARS Coronavirus 2 by RT PCR NEGATIVE NEGATIVE Final    Comment: (NOTE) SARS-CoV-2 target nucleic acids are NOT DETECTED.  The SARS-CoV-2 RNA is generally detectable in upper respiratory specimens during the acute phase of infection. The lowest concentration of SARS-CoV-2 viral copies this assay can detect is 138 copies/mL. A negative result does not preclude SARS-Cov-2 infection and should not be used as the sole basis for treatment or other patient management  decisions. A negative result may occur with  improper specimen collection/handling, submission of specimen other than nasopharyngeal swab, presence of viral mutation(s) within the areas targeted by this assay, and inadequate number of viral copies(<138 copies/mL). A negative result must be combined with clinical observations, patient history, and epidemiological information. The expected result is Negative.  Fact Sheet for Patients:  EntrepreneurPulse.com.au  Fact Sheet for Healthcare Providers:  IncredibleEmployment.be  This test is no t yet approved or cleared by the [place] FDA and  has been authorized for detection and/or diagnosis of SARS-CoV-2 by FDA under an Emergency Use Authorization (EUA). This EUA will remain  in effect (meaning this test can be used) for the duration of the COVID-19 declaration under Section 564(b)(1) of the Act, 21 U.S.C.section 360bbb-3(b)(1), unless the authorization is terminated  or revoked sooner.       Influenza A by PCR NEGATIVE NEGATIVE Final   Influenza B by PCR NEGATIVE NEGATIVE Final    Comment: (NOTE) The Xpert Xpress SARS-CoV-2/FLU/RSV plus assay is intended as an aid in the diagnosis of influenza from Nasopharyngeal swab specimens and should not be used as a sole basis for treatment. Nasal washings and aspirates are unacceptable for Xpert Xpress SARS-CoV-2/FLU/RSV testing.  Fact Sheet for Patients: EntrepreneurPulse.com.au  Fact Sheet for Healthcare Providers: IncredibleEmployment.be  This test is not yet approved or cleared by the [place] FDA and has been authorized for detection and/or diagnosis of SARS-CoV-2 by FDA under an Emergency Use Authorization (EUA). This EUA will remain in effect (meaning this test can be used) for the duration of the COVID-19 declaration under Section 564(b)(1) of the Act, 21 U.S.C. section 360bbb-3(b)(1), unless the  authorization is terminated or revoked.  Performed at [facility], [place] [address] [place], [place] [address]          Radiology Studies: DG Chest 2 View  Result Date: 03/30/2021 CLINICAL DATA:  Chest pain, shortness of breath. EXAM: CHEST - 2 VIEW COMPARISON:  August 15, 2020. FINDINGS: Stable cardiomegaly with mild central pulmonary vascular congestion. No definite consolidative process is noted. Bony thorax is unremarkable. IMPRESSION: Stable cardiomegaly with mild central pulmonary vascular congestion. Electronically Signed   By: Marijo Conception M.D.   On: 03/30/2021 11:43        Scheduled Meds:  aspirin  325 mg Oral Daily   cholecalciferol  2,000 Units Oral q morning   furosemide  40 mg Intravenous Q12H   isosorbide mononitrate  30 mg Oral Daily   levothyroxine  50 mcg Oral QAC breakfast   metoprolol succinate  25 mg Oral Daily   potassium chloride  40 mEq Oral BID   sodium chloride flush  3 mL Intravenous Q12H   sotalol  120 mg Oral BID   Warfarin - Pharmacist Dosing Inpatient   Does not apply q1600   Continuous Infusions:  sodium chloride  Assessment & Plan:   Principal Problem:   Chest pain Active Problems:   Hypertension   Hypothyroidism (acquired)   Paroxysmal atrial fibrillation (HCC)   Acute on chronic heart failure with preserved ejection fraction (HCC)   CAD (coronary artery disease)   1-Chest pain, history of CAD: Cards consulted, input appreciated No further ischemic evaluation necessary Troponin negative EKG nonischemic   2-Acute on chronic diastolic heart failure exacerbation: Echo pending Continue lasix  I/o Daily wt      3-Paroxysmal A-fib:  Continue metoprolol  Continue sotalol  Failed Tikosyn due to cost  No CV at this time Continue Coumadin    4-Hypothyroidism:  Continue synthroid  5.Hypokalemia Supplement with Kcl Monitor levels     DVT prophylaxis: Coumadin Code Status: Full Family Communication: None  at bedside Disposition Plan:  Status is: Observation The patient remains OBS appropriate and will d/c before 2 midnights.              LOS: 0 days   Time spent: 45 minutes with more than 50% on [place], MD [facility] Pager [phone]  If 7PM-7AM, please contact night-coverage 03/31/2021, 10:53 AM

## 2021-03-31 NOTE — Progress Notes (Signed)
? ?Progress Note ? ?Patient Name: [name] ?Date of Encounter: 03/31/2021 ? ?[facility] Cardiologist: [name], MD  ? ?Subjective  ? ?Feels better, less SOB ? ?Inpatient Medications  ?  ?Scheduled Meds: ? aspirin  325 mg Oral Daily  ? cholecalciferol  2,000 Units Oral q morning  ? furosemide  40 mg Intravenous Q12H  ? isosorbide mononitrate  30 mg Oral Daily  ? levothyroxine  50 mcg Oral QAC breakfast  ? metoprolol succinate  25 mg Oral Daily  ? sodium chloride flush  3 mL Intravenous Q12H  ? sotalol  120 mg Oral BID  ? Warfarin - Pharmacist Dosing Inpatient   Does not apply [id number]  ? ?Continuous Infusions: ? sodium chloride    ? ?PRN Meds: ?sodium chloride, ondansetron **OR** ondansetron (ZOFRAN) IV, polyethylene glycol, sodium chloride flush  ? ?Vital Signs  ?  ?Vitals:  ? 03/31/21 0500 03/31/21 0600 03/31/21 0700 03/31/21 0910  ?BP: 128/77 136/81 (!) 134/95 121/71  ?Pulse: 72 85 84 85  ?Resp: 20 20    ?Temp:      ?TempSrc:      ?SpO2: 92% 92% 92%   ?Weight:      ?Height:      ? ? ?Intake/Output Summary (Last 24 hours) at 03/31/2021 1039 ?Last data filed at 03/31/2021 0316 ?Gross per 24 hour  ?Intake --  ?Output 4000 ml  ?Net -4000 ml  ? ?Last 3 Weights 03/30/2021 08/20/2020 08/19/2020  ?Weight (lbs) 267 lb 3.2 oz 267 lb 4.8 oz 266 lb 12.1 oz  ?Weight (kg) 121.2 kg 121.246 kg 121 kg  ?   ? ?Telemetry  ?  ?AFIB 80-90 - Personally Reviewed ? ?ECG  ?  ?No new - Personally Reviewed ? ?Physical Exam  ? ?GEN: No acute distress.  Obese ?Neck: No JVD ?Cardiac: IRRR, no murmurs, rubs, or gallops.  ?Respiratory: Clear to auscultation bilaterally. ?GI: Soft, nontender, non-distended  ?MS: No edema; No deformity. ?Neuro:  Nonfocal  ?Psych: Normal affect  ? ?Labs  ?  ?High Sensitivity Troponin:   ?Recent Labs  ?Lab 03/30/21 ?1117 03/30/21 ?1753  ?TROPONINIHS 7 7  ?   ?Chemistry ?Recent Labs  ?Lab 03/30/21 ?1117 03/31/21 ?6599  ?NA 139 140  ?K 3.7 3.3*  ?CL 101 99  ?CO2 29 31  ?GLUCOSE 108* 101*  ?BUN 11 9  ?CREATININE  1.13* 0.88  ?CALCIUM 8.5* 8.3*  ?PROT 6.0* 6.0*  ?ALBUMIN 3.0* 3.0*  ?AST 18 19  ?ALT 12 12  ?ALKPHOS 58 46  ?BILITOT 0.5 1.1  ?GFRNONAA 51* >60  ?ANIONGAP 9 10  ?  ?Lipids No results for input(s): CHOL, TRIG, HDL, LABVLDL, LDLCALC, CHOLHDL in the last 168 hours.  ?Hematology ?Recent Labs  ?Lab 03/30/21 ?1117 03/31/21 ?3570  ?WBC 7.7 8.5  ?RBC 4.84 5.01  ?HGB 14.4 15.2*  ?HCT 44.5 45.6  ?MCV 91.9 91.0  ?MCH 29.8 30.3  ?MCHC 32.4 33.3  ?RDW 15.9* 15.9*  ?PLT 491* 479*  ? ?Thyroid No results for input(s): TSH, FREET4 in the last 168 hours.  ?BNPNo results for input(s): BNP, PROBNP in the last 168 hours.  ?DDimer No results for input(s): DDIMER in the last 168 hours.  ? ?Radiology  ?  ?DG Chest 2 View ? ?Result Date: 03/30/2021 ?CLINICAL DATA:  Chest pain, shortness of breath. EXAM: CHEST - 2 VIEW COMPARISON:  August 15, 2020. FINDINGS: Stable cardiomegaly with mild central pulmonary vascular congestion. No definite consolidative process is noted. Bony thorax is unremarkable. IMPRESSION: Stable  cardiomegaly with mild central pulmonary vascular congestion. Electronically Signed   By: Marijo Conception M.D.   On: 03/30/2021 11:43   ? ?Cardiac Studies  ? ?Prior EF 50% ?Repeat P ? ?Patient Profile  ?   ?76 y.o. female known well to me with longstanding history of diastolic heart failure, paroxysmal/persistent atrial fibrillation previously on sotalol, discontinued Tikosyn due to financial concerns several years ago with EF of 50 to 55% most recently, 2016, 25 to 30%, here with atypical chest discomfort/sharp left arm pain that was fleeting.  Troponin reassuring.  She also has had shortness of breath when walking short distances NYHA class between 2 and 3.  She was also complaining of generalized malaise, fatigue, feeling poorly for the last 10 days.  "Feeling miserable all over ".  Denies any recent fevers chills nausea vomiting.  Lab work, fairly unremarkable, creatinine 1.13 ALT 12 troponin 7 white count 7.7.  Chest x-ray  personally reviewed shows very minimal pulmonary vascular congestion. ? ?Assessment & Plan  ?  ?Chest pain ?- No further ischemic evaluation necessary.  Sharp atypical discomfort.  Troponin is normal.  EKG without any signs of ischemia, prior stress test in 2020 was low risk. Doing well now.  ?  ?Acute on chronic diastolic heart failure ?- May be related to atrial fibrillation but she has had longstanding issues with shortness of breath etc.  Likely multifactorial from deconditioning, morbid obesity, BMI is 45. ?-Gave Lasix 40 mg IV twice daily.  She takes 40 mg p.o. daily at home. 4L out. Excellent ?--Creat 0.88, K 3.3 - replete, will order 40 BID for next 2 days ?  ?Atrial fibrillation ?- Continue with sotalol.  Failed Tikosyn due to cost. ?-Okay to continue with metoprolol succinate 25 mg. Good rate control. ?-Cardioversion does not need to be performed.  I can always have her discuss other options with Dr. Curt Bears her EP, but I am not sure that conversion to NSR will help her symptoms.  ?  ?Anticoagulation ?- On warfarin.  Previously I believe Eliquis was a cost issue. ?  ?Generalized fatigue ?- Likely multifactorial as well. Encourage movement.   ? ?Doing better - let's continue with lasix...more to go ? ? ?For questions or updates, please contact [place] ?Please consult [email or web address] for contact info under  ? ?  ?   ?Signed, ?[name], MD  ?03/31/2021, 10:39 AM   ? ?

## 2021-03-31 NOTE — ED Notes (Signed)
NT provided pt with recliner to eat breakfast. ?

## 2021-03-31 NOTE — Care Management Obs Status (Signed)
MEDICARE OBSERVATION STATUS NOTIFICATION ? ? ?Patient Details  ?Name: [name] ?MRN: [id number] ?Date of Birth: 05/29/1945 ? ? ?Medicare Observation Status Notification Given:  Yes ? ? ? ?Carles Collet, RN ?03/31/2021, 2:36 PM ?

## 2021-03-31 NOTE — ED Notes (Signed)
Provided pt walker to ambulate to the restroom ? ?

## 2021-04-01 DIAGNOSIS — I480 Paroxysmal atrial fibrillation: Secondary | ICD-10-CM

## 2021-04-01 DIAGNOSIS — I5033 Acute on chronic diastolic (congestive) heart failure: Secondary | ICD-10-CM | POA: Diagnosis not present

## 2021-04-01 DIAGNOSIS — Z7901 Long term (current) use of anticoagulants: Secondary | ICD-10-CM | POA: Diagnosis not present

## 2021-04-01 DIAGNOSIS — I2510 Atherosclerotic heart disease of native coronary artery without angina pectoris: Secondary | ICD-10-CM

## 2021-04-01 DIAGNOSIS — Z79899 Other long term (current) drug therapy: Secondary | ICD-10-CM | POA: Diagnosis not present

## 2021-04-01 DIAGNOSIS — R079 Chest pain, unspecified: Secondary | ICD-10-CM | POA: Diagnosis not present

## 2021-04-01 DIAGNOSIS — I10 Essential (primary) hypertension: Secondary | ICD-10-CM | POA: Diagnosis not present

## 2021-04-01 DIAGNOSIS — R0789 Other chest pain: Secondary | ICD-10-CM | POA: Diagnosis not present

## 2021-04-01 DIAGNOSIS — Z20822 Contact with and (suspected) exposure to covid-19: Secondary | ICD-10-CM | POA: Diagnosis not present

## 2021-04-01 LAB — BASIC METABOLIC PANEL
Anion gap: 8 (ref 5–15)
BUN: 15 mg/dL (ref 8–23)
CO2: 34 mmol/L — ABNORMAL HIGH (ref 22–32)
Calcium: 8.8 mg/dL — ABNORMAL LOW (ref 8.9–10.3)
Chloride: 95 mmol/L — ABNORMAL LOW (ref 98–111)
Creatinine, Ser: 1.15 mg/dL — ABNORMAL HIGH (ref 0.44–1.00)
GFR, Estimated: 50 mL/min — ABNORMAL LOW (ref >60)
Glucose, Bld: 101 mg/dL — ABNORMAL HIGH (ref 70–99)
Potassium: 3.9 mmol/L (ref 3.5–5.1)
Sodium: 137 mmol/L (ref 135–145)

## 2021-04-01 LAB — PROTIME-INR
INR: 2.2 — ABNORMAL HIGH (ref 0.8–1.2)
Prothrombin Time: 24.2 s — ABNORMAL HIGH (ref 11.4–15.2)

## 2021-04-01 LAB — LIPID PANEL
Cholesterol: 213 mg/dL — ABNORMAL HIGH (ref 0–200)
HDL: 48 mg/dL (ref >40)
LDL Cholesterol: 144 mg/dL — ABNORMAL HIGH (ref 0–99)
Total CHOL/HDL Ratio: 4.4 RATIO
Triglycerides: 106 mg/dL (ref <150)
VLDL: 21 mg/dL (ref 0–40)

## 2021-04-01 MED ORDER — WARFARIN SODIUM 5 MG PO TABS
5.0000 mg | ORAL_TABLET | Freq: Once | ORAL | Status: AC
  Administered 2021-04-01: 5 mg via ORAL
  Filled 2021-04-01: qty 1

## 2021-04-01 NOTE — Progress Notes (Addendum)
Progress Note  Patient Name: [name] Date of Encounter: 04/01/2021  [facility] Cardiologist: [name], MD   Subjective   No acute overnight events. Patient states she feels like she is breathing better and feels pretty close to baseline. No chest pain. No palpitations.  Inpatient Medications    Scheduled Meds:  cholecalciferol  2,000 Units Oral q morning   furosemide  40 mg Intravenous Q12H   isosorbide mononitrate  30 mg Oral Daily   levothyroxine  50 mcg Oral QAC breakfast   metoprolol succinate  25 mg Oral Daily   potassium chloride  40 mEq Oral BID   sodium chloride flush  3 mL Intravenous Q12H   sotalol  120 mg Oral BID   Warfarin - Pharmacist Dosing Inpatient   Does not apply q1600   Continuous Infusions:  sodium chloride     PRN Meds: sodium chloride, ondansetron **OR** ondansetron (ZOFRAN) IV, polyethylene glycol, sodium chloride flush   Vital Signs    Vitals:   03/31/21 2059 04/01/21 0017 04/01/21 0529 04/01/21 0818  BP: (!) 114/57 128/78 137/85 (!) 117/103  Pulse: 74 88 85 89  Resp: 20 18 19 15   Temp: 98 F (36.7 C) 98 F (36.7 C) 97.6 F (36.4 C) 97.6 F (36.4 C)  TempSrc: Oral Oral Oral Oral  SpO2: 92% 93% 96% 97%  Weight:   115.9 kg   Height:        Intake/Output Summary (Last 24 hours) at 04/01/2021 0911 Last data filed at 04/01/2021 0530 Gross per 24 hour  Intake 0 ml  Output 2000 ml  Net -2000 ml   Last 3 Weights 04/01/2021 03/31/2021 03/30/2021  Weight (lbs) 255 lb 8 oz 255 lb 12.8 oz 267 lb 3.2 oz  Weight (kg) 115.894 kg 116.03 kg 121.2 kg      Telemetry    Atrial fibrillation with rates in the 80s to 90s at rates. Occasional PVCs. - Personally Reviewed  ECG    No new ECG tracing today.  - Personally Reviewed  Physical Exam   GEN: No acute distress.   Neck: No JVD. Cardiac: Irregularly irregular rhythm with normal rate. No murmurs, rubs, or gallops.  Respiratory: Clear to auscultation bilaterally. No wheezes,  rhonchi, or rales. GI: Soft, non-distended, and non-tender. MS: Trace lower extremity edema bilaterally.. No deformity. Skin: Warm and dry. Neuro:  No focal deficits. Psych: Normal affect. Responds appropriately.  Labs    High Sensitivity Troponin:   Recent Labs  Lab 03/30/21 1117 03/30/21 1753  TROPONINIHS 7 7     Chemistry Recent Labs  Lab 03/30/21 1117 03/31/21 0545 04/01/21 0247  NA 139 140 137  K 3.7 3.3* 3.9  CL 101 99 95*  CO2 29 31 34*  GLUCOSE 108* 101* 101*  BUN 11 9 15   CREATININE 1.13* 0.88 1.15*  CALCIUM 8.5* 8.3* 8.8*  PROT 6.0* 6.0*  --   ALBUMIN 3.0* 3.0*  --   AST 18 19  --   ALT 12 12  --   ALKPHOS 58 46  --   BILITOT 0.5 1.1  --   GFRNONAA 51* >60 50*  ANIONGAP 9 10 8     Lipids No results for input(s): CHOL, TRIG, HDL, LABVLDL, LDLCALC, CHOLHDL in the last 168 hours.  Hematology Recent Labs  Lab 03/30/21 1117 03/31/21 0545  WBC 7.7 8.5  RBC 4.84 5.01  HGB 14.4 15.2*  HCT 44.5 45.6  MCV 91.9 91.0  MCH 29.8 30.3  MCHC 32.4  33.3  RDW 15.9* 15.9*  PLT 491* 479*   Thyroid No results for input(s): TSH, FREET4 in the last 168 hours.  BNPNo results for input(s): BNP, PROBNP in the last 168 hours.  DDimer No results for input(s): DDIMER in the last 168 hours.   Radiology    DG Chest 2 View  Result Date: 03/30/2021 CLINICAL DATA:  Chest pain, shortness of breath. EXAM: CHEST - 2 VIEW COMPARISON:  August 15, 2020. FINDINGS: Stable cardiomegaly with mild central pulmonary vascular congestion. No definite consolidative process is noted. Bony thorax is unremarkable. IMPRESSION: Stable cardiomegaly with mild central pulmonary vascular congestion. Electronically Signed   By: Marijo Conception M.D.   On: 03/30/2021 11:43   ECHOCARDIOGRAM COMPLETE  Result Date: 03/31/2021    ECHOCARDIOGRAM REPORT   Patient Name:   [name] Emry Date of Exam: 03/31/2021 Medical Rec #:  [id number]          Height:       64.0 in Accession #:    [id number]         Weight:        267.2 lb Date of Birth:  11/14/1945          BSA:          2.213 m Patient Age:    76 years           BP:           121/71 mmHg Patient Gender: F                  HR:           85 bpm. Exam Location:  Inpatient Procedure: 2D Echo Indications:    Chest pain  History:        Patient has prior history of Echocardiogram examinations, most                 recent 08/17/2020. CHF and Cardiomyopathy, CAD, Arrythmias:Atrial                 Fibrillation; Risk Factors:Hypertension.  Sonographer:    Arlyss Gandy Referring Phys: [id number] Jerald Kief A REGALADO  Sonographer Comments: Image acquisition challenging due to patient body habitus. IMPRESSIONS  1. Left ventricular ejection fraction, by estimation, is 55 to 60%. The left ventricle has normal function. The left ventricle has no regional wall motion abnormalities. Left ventricular diastolic function could not be evaluated.  2. Right ventricular systolic function is moderately reduced. The right ventricular size is mildly enlarged. There is normal pulmonary artery systolic pressure.  3. Right atrial size was moderately dilated.  4. The mitral valve is grossly normal. No evidence of mitral valve regurgitation.  5. The aortic valve is grossly normal. Aortic valve regurgitation is not visualized. No aortic stenosis is present. FINDINGS  Left Ventricle: Left ventricular ejection fraction, by estimation, is 55 to 60%. The left ventricle has normal function. The left ventricle has no regional wall motion abnormalities. The left ventricular internal cavity size was normal in size. There is  no left ventricular hypertrophy. Left ventricular diastolic function could not be evaluated due to atrial fibrillation. Left ventricular diastolic function could not be evaluated. Right Ventricle: The right ventricular size is mildly enlarged. Right vetricular wall thickness was not well visualized. Right ventricular systolic function is moderately reduced. There is normal pulmonary artery systolic  pressure. The tricuspid regurgitant velocity is 2.57 m/s, and with an assumed right atrial pressure of 3 mmHg, the estimated right ventricular systolic  pressure is 29.4 mmHg. Left Atrium: Left atrial size was normal in size. Right Atrium: Right atrial size was moderately dilated. Pericardium: There is no evidence of pericardial effusion. Mitral Valve: The mitral valve is grossly normal. No evidence of mitral valve regurgitation. MV peak gradient, 6.5 mmHg. The mean mitral valve gradient is 3.0 mmHg. Tricuspid Valve: The tricuspid valve is grossly normal. Tricuspid valve regurgitation is trivial. Aortic Valve: The aortic valve is grossly normal. Aortic valve regurgitation is not visualized. Aortic regurgitation PHT measures 679 msec. No aortic stenosis is present. Aortic valve mean gradient measures 6.0 mmHg. Aortic valve peak gradient measures 9.6 mmHg. Aortic valve area, by VTI measures 1.39 cm. Pulmonic Valve: The pulmonic valve was grossly normal. Pulmonic valve regurgitation is trivial. Aorta: The aortic root and ascending aorta are structurally normal, with no evidence of dilitation. IAS/Shunts: The atrial septum is grossly normal.  LEFT VENTRICLE PLAX 2D LVIDd:         3.90 cm   Diastology LVIDs:         2.90 cm   LV e' medial:    7.83 cm/s LV PW:         1.10 cm   LV E/e' medial:  12.1 LV IVS:        0.90 cm   LV e' lateral:   7.07 cm/s LVOT diam:     2.00 cm   LV E/e' lateral: 13.5 LV SV:         44 LV SV Index:   20 LVOT Area:     3.14 cm  RIGHT VENTRICLE            IVC RV Basal diam:  3.90 cm    IVC diam: 1.90 cm RV Mid diam:    2.80 cm RV S prime:     7.94 cm/s TAPSE (M-mode): 1.4 cm LEFT ATRIUM             Index        RIGHT ATRIUM           Index LA diam:        3.30 cm 1.49 cm/m   RA Area:     24.50 cm LA Vol (A2C):   96.1 ml 43.43 ml/m  RA Volume:   78.80 ml  35.61 ml/m LA Vol (A4C):   46.9 ml 21.20 ml/m LA Biplane Vol: 69.1 ml 31.23 ml/m  AORTIC VALVE AV Area (Vmax):    1.53 cm AV Area  (Vmean):   1.48 cm AV Area (VTI):     1.39 cm AV Vmax:           155.00 cm/s AV Vmean:          113.000 cm/s AV VTI:            0.315 m AV Peak Grad:      9.6 mmHg AV Mean Grad:      6.0 mmHg LVOT Vmax:         75.50 cm/s LVOT Vmean:        53.400 cm/s LVOT VTI:          0.139 m LVOT/AV VTI ratio: 0.44 AI PHT:            679 msec  AORTA Ao Root diam: 2.90 cm Ao Asc diam:  2.90 cm MITRAL VALVE               TRICUSPID VALVE MV Area (PHT): 2.56 cm    TR Peak grad:  26.4 mmHg MV Area VTI:   2.01 cm    TR Vmax:        257.00 cm/s MV Peak grad:  6.5 mmHg MV Mean grad:  3.0 mmHg    SHUNTS MV Vmax:       1.27 m/s    Systemic VTI:  0.14 m MV Vmean:      69.4 cm/s   Systemic Diam: 2.00 cm MV Decel Time: 296 msec MV E velocity: 95.10 cm/s Mertie Moores MD Electronically signed by Mertie Moores MD Signature Date/Time: 03/31/2021/3:32:50 PM    Final     Cardiac Studies   Echocardiogram 03/31/2021: Impressions:  1. Left ventricular ejection fraction, by estimation, is 55 to 60%. The  left ventricle has normal function. The left ventricle has no regional  wall motion abnormalities. Left ventricular diastolic function could not  be evaluated.   2. Right ventricular systolic function is moderately reduced. The right  ventricular size is mildly enlarged. There is normal pulmonary artery  systolic pressure.   3. Right atrial size was moderately dilated.   4. The mitral valve is grossly normal. No evidence of mitral valve  regurgitation.   5. The aortic valve is grossly normal. Aortic valve regurgitation is not  visualized. No aortic stenosis is present.   Patient Profile     76 y.o. female with a history of CAD s/p DES to LAD in 0/4540, chronic diastolic CHF, paroxysmal/persistent atrial fibrillation on Coumadin, hypertension, hyperlipidemia, type 2 diabetes mellitus, and obesity who was admitted on 03/30/2021 with atypical chest pain and acute on chronic diastolic CHF.  Assessment & Plan    Chest  Pain CAD History of CAD s/p DES to LAD in 2017. Patient presented with one episode of atypical chest pain that she describes as sharp pain that short from her sternum to left hand. EKG showed no acute ischemic changes. High-sensitivity troponin negative x2. Echo showed normal LV function and wall motion. - No recurrent pain.  - No aspirin due to Coumadin. - Continue home Imdur 30mg  daily and Toprol-XL 25mg  daily.  - Previously intolerant to a statin and does not want to retry. - Symptoms very atypical. No additional ischemic work-up necessary.  Acute on Chronic Diastolic CHF Patient presented with worsening dyspnea on exertion and fatigue for 2 weeks. Chest x-ray showed mild central pulmonary vascular congestion. Echo showed LVEF of 55-60% with normal wall motion. RV mildly enlarged with moderately reduced systolic function but PASP normal. Started on IV Lasix with good urinary response. No documented input this admission but 6L of output. Weight down 12 lbs from admission. Creatinine slightly up from yesterday but stable from admission. - Does not appear grossly volume overloaded on exam. - Plan was to continued IV Lasix 40mg  twice daily through today. Can likely go back to PO Lasix tomorrow. - Continue to monitor daily weights, strict I/Os, and renal function.  Persistent Atrial Fibrillation Failed Tikosyn due to cost so has been on Sotalol. - Reasonable rate control. Rates in the 80s to 90s at rest but easily increases to low 100s with minimal activity (suspect this is likely due to obesity and deconditioning). - Continue Toprol-XL 25mg  daily. - Continue Sotalol 120mg  twice daily. - Continue chronic anticoagulation with Coumadin. Dosing per Pharmacy.  - No plans for DCCV at this time.  Hypertension BP well controlled. - Continue Imdur and Toprol-XL as above.  Hyperlipidemia Patient has hyperlipidemia listed in chart but is not on any medications. Last LDL in our system 123  in 2018.  -  Patient states she previously was on a statin (cannot remember which one) and had significant myalgias with this. She does not want to retry a statin. - Will repeat fasting lipid panel in the morning.  Otherwise, per primary team: - Type 2 diabetes mellitus - Generalized fatigue  - Hypothyroidism  For questions or updates, please contact [facility] Please consult [email or web address] for contact info under        Signed, Darreld Mclean, PA-C  04/01/2021, 9:11 AM    Personally seen and examined. Agree with above.  Acute diastolic heart failure - EF 60% weight is down 12 pounds.  Good overall diuresis thus far.  Continue with IV Lasix 40 mg twice a day.  Still not at baseline.  Persistent atrial fibrillation - Has failed Tikosyn in the past because of cost, social determinants of health issues.  Currently on sotalol.  Reasonably rate controlled.  We discussed at length.  Dr. Walker Kehr.  I am not convinced that restoration of sinus rhythm will make a huge impact overall.  [name], MD

## 2021-04-01 NOTE — Progress Notes (Addendum)
Heart Failure Nurse Navigator Progress Note ? ?Echo: EF 60% ? ?Diuresing well with IV Lasix, wt down 12 lbs. ? ?Appt sch w/H&V TOC Clinic for 04/12/21 ? ?Kevan Rosebush, RN, BSN, CHFN ?Heart Failure Navigator ?Heart & Vascular Care Navigation Team ? ? ? ?Pt refused H&V TOC appt, she will "think about it" and call Raquel Sarna, CSW if she wants to resch. ?

## 2021-04-01 NOTE — Progress Notes (Signed)
CSW met at bedside with patient to discuss HF TOC clinic. Patient states she typically does not go out of her house due to bowel issues and concerns of having an accident. Patient states at this time she would prefer to not come to the HF [facility] but aware of the option and will reach out if she changes her mind. Raquel Sarna, [place], [place] ? ?

## 2021-04-01 NOTE — Progress Notes (Addendum)
ANTICOAGULATION CONSULT NOTE ? ?Pharmacy Consult for Warfarin ?Indication: atrial fibrillation ? ?Patient Measurements: ?Height: 5\' 4"  (162.6 cm) ?Weight: 115.9 kg (255 lb 8 oz) ?IBW/kg (Calculated) : 54.7 ? ?Vital Signs: ?Temp: 97.6 ?F (36.4 ?C) (03/02 0818) ?Temp Source: Oral (03/02 0818) ?BP: 117/103 (03/02 0818) ?Pulse Rate: 89 (03/02 0818) ? ?Labs: ?Recent Labs  ?  03/30/21 ?1117 03/30/21 ?1242 03/30/21 ?1753 03/31/21 ?8325 04/01/21 ?4982  ?HGB 14.4  --   --  15.2*  --   ?HCT 44.5  --   --  45.6  --   ?PLT 491*  --   --  479*  --   ?LABPROT  --  21.8*  --  23.3* 24.2*  ?INR  --  1.9*  --  2.1* 2.2*  ?CREATININE 1.13*  --   --  0.88 1.15*  ?TROPONINIHS 7  --  7  --   --   ? ? ?Estimated Creatinine Clearance: 52.8 mL/min (A) (by C-G formula based on SCr of 1.15 mg/dL (H)). ? ?Assessment: ?64 YOF presenting with CP, hx of afib on warfarin PTA, last dose taken 2/28. INR today is therapeutic at 2.2.  ? ?PTA dosing: 5mg  daily, except 7.5mg  Tuesdays ? ?Goal of Therapy:  ?INR 2-3 ?Monitor platelets by anticoagulation protocol: Yes ?  ?Plan:  ?Warfarin 5mg  PO x 1 tonight ?Daily INR ? ?Hildred Laser, PharmD ?Clinical Pharmacist ?**Pharmacist phone directory can now be found on [email or web address] ([email or web address]).  Listed under [place]. ? ? ? ? ? ?

## 2021-04-01 NOTE — Evaluation (Signed)
Physical Therapy Evaluation ?Patient Details ?Name: [name] ?MRN: [id number] ?DOB: Aug 28, 1945 ?Today's Date: 04/01/2021 ? ?History of Present Illness ? The pt is a 76 yo female presenting 2/28 with chest pain that improved with NG and SOB. Found to have acute CHF exacerbation. PMH includes: CAD s/p DES, CHF, afib on warfarin, thrombotic thrombocytopenic purpura, HTN, hypothyroidism, and morbid obesity. ?  ?Clinical Impression ? Pt in bed upon arrival of PT, agreeable to evaluation at this time. Prior to admission the pt was mobilizing without need for DME or assist. She lives alone but has intermittent support from her children for IADLs. The pt now presents with limitations in functional mobility, endurance, dynamic stability, and power due to above dx, and will continue to benefit from skilled PT to address these deficits. The pt was able to complete sit-stand transfers with supervision and no DME, but was limited to ~60 ft hallway ambulation due to fatigue at this time. She walks with significant lateral sway and instability, but had no overt LOB. The pt was educated on progressive walking program to progress endurance and the pt declined stating she mobilizes frequently due to frequency of urination at home. I also recommended HHPT to guide pt recovery and recovery of LE strength and power, but she also declined this follow up service. Will continue to follow acutely to maximize functional endurance prior to return home. Pt is safe to return home with family assist when medically stable.  ?   ?   ? ?Recommendations for follow up therapy are one component of a multi-disciplinary discharge planning process, led by the attending physician.  Recommendations may be updated based on patient status, additional functional criteria and insurance authorization. ? ?Follow Up Recommendations Home health PT (pt declined) ? ?  ?Assistance Recommended at Discharge Intermittent Supervision/Assistance  ?Patient can return  home with the following ? A little help with bathing/dressing/bathroom;Assistance with cooking/housework;Direct supervision/assist for medications management;Assist for transportation;Help with stairs or ramp for entrance ? ?  ?Equipment Recommendations None recommended by PT  ?Recommendations for Other Services ?    ?  ?Functional Status Assessment Patient has had a recent decline in their functional status and demonstrates the ability to make significant improvements in function in a reasonable and predictable amount of time.  ? ?  ?Precautions / Restrictions Precautions ?Precautions: Fall ?Restrictions ?Weight Bearing Restrictions: No  ? ?  ? ?Mobility ? Bed Mobility ?Overal bed mobility: Needs Assistance ?Bed Mobility: Supine to Sit, Sit to Supine ?  ?  ?Supine to sit: Supervision ?Sit to supine: Supervision ?  ?General bed mobility comments: no assit needed, pt using bed rail ?  ? ?Transfers ?Overall transfer level: Needs assistance ?Equipment used: None ?Transfers: Sit to/from Stand ?Sit to Stand: Supervision ?  ?  ?  ?  ?  ?General transfer comment: no assist needed, pt slow to rise but steady on initial stand. no UE support ?  ? ?Ambulation/Gait ?Ambulation/Gait assistance: Min guard ?Gait Distance (Feet): 60 Feet ?Assistive device: None ?Gait Pattern/deviations: Step-to pattern, Decreased stride length, Shuffle, Wide base of support ?Gait velocity: decreased ?Gait velocity interpretation: 1.31 - 2.62 ft/sec, indicative of limited community ambulator ?  ?General Gait Details: small steps with significant lateral movements. no overt LOB but generally unsteady. VSS on RA ? ?  ? ?Balance Overall balance assessment: Mild deficits observed, not formally tested ?  ?  ?  ?  ?  ?  ?  ?  ?  ?  ?  ?  ?  ?  ?  ?  ?  ?  ?   ? ? ? ?  Pertinent Vitals/Pain Pain Assessment ?Pain Assessment: No/denies pain  ? ? ?Home Living Family/patient expects to be discharged to:: Private residence ?Living Arrangements: Alone ?Available  Help at Discharge: Family;Available PRN/intermittently ?Type of Home: House ?Home Access: Level entry ?  ?  ?  ?Home Layout: One level ?Home Equipment: Conservation officer, nature (2 wheels);Shower seat;Grab bars - toilet;Grab bars - tub/shower ?Additional Comments: Pt reports she has never opened her RW, but has access at home. bathroom is small and she can hold onto other objects while moving  ?  ?Prior Function Prior Level of Function : Independent/Modified Independent ?  ?  ?  ?  ?  ?  ?Mobility Comments: pt reports limited by SOB but able to drive and complete self-care as needed ?ADLs Comments: pt reports independence, does not wear socks, uses slip on shoes ?  ? ? ?Hand Dominance  ? Dominant Hand: Right ? ?  ?Extremity/Trunk Assessment  ? Upper Extremity Assessment ?Upper Extremity Assessment: Generalized weakness ?  ? ?Lower Extremity Assessment ?Lower Extremity Assessment: Generalized weakness (grossly functional but with significant habitus and soft tissue. pt able to move against gravity, poor endurance) ?  ? ?Cervical / Trunk Assessment ?Cervical / Trunk Assessment: Other exceptions ?Cervical / Trunk Exceptions: large body habitus  ?Communication  ? Communication: No difficulties  ?Cognition Arousal/Alertness: Awake/alert ?Behavior During Therapy: [facility] for tasks assessed/performed ?Overall Cognitive Status: Within Functional Limits for tasks assessed ?  ?  ?  ?  ?  ?  ?  ?  ?  ?  ?  ?  ?  ?  ?  ?  ?General Comments: pt able to follow simple commands, determined to complete things her way ?  ?  ? ?  ?General Comments General comments (skin integrity, edema, etc.): VSS on RA with all mobility and activities. ? ?  ?Exercises    ? ?Assessment/Plan  ?  ?PT Assessment Patient needs continued PT services  ?PT Problem List Decreased range of motion;Decreased activity tolerance;Decreased balance;Decreased mobility;Cardiopulmonary status limiting activity ? ?   ?  ?PT Treatment Interventions DME instruction;Gait  training;Functional mobility training;Therapeutic activities;Therapeutic exercise;Balance training;Patient/family education   ? ?PT Goals (Current goals can be found in the Care Plan section)  ?Acute Rehab PT Goals ?Patient Stated Goal: to return home as soon as possible ?PT Goal Formulation: With patient ?Time For Goal Achievement: 04/15/21 ?Potential to Achieve Goals: Good ? ?  ?Frequency Min 3X/week ?  ? ? ?   ?AM-PAC PT "6 Clicks" Mobility  ?Outcome Measure Help needed turning from your back to your side while in a flat bed without using bedrails?: None ?Help needed moving from lying on your back to sitting on the side of a flat bed without using bedrails?: None ?Help needed moving to and from a bed to a chair (including a wheelchair)?: A Little ?Help needed standing up from a chair using your arms (e.g., wheelchair or bedside chair)?: A Little ?Help needed to walk in hospital room?: A Little ?Help needed climbing 3-5 steps with a railing? : A Little ?6 Click Score: 20 ? ?  ?End of Session Equipment Utilized During Treatment: Gait belt ?Activity Tolerance: Patient tolerated treatment well ?Patient left: in bed;with call bell/phone within reach;with bed alarm set ?Nurse Communication: Mobility status ?PT Visit Diagnosis: Unsteadiness on feet (R26.81);Other abnormalities of gait and mobility (R26.89) ?  ? ?Time: 7846-9629 ?PT Time Calculation (min) (ACUTE ONLY): 29 min ? ? ?Charges:   PT Evaluation ?$PT Eval Low Complexity: 1 Low ?PT  Treatments ?$Therapeutic Exercise: 8-22 mins ?  ?   ? ? ?[place], PT, DPT  ? ?Acute Rehabilitation Department ?Pager #: [phone] ? ?[name] ?04/01/2021, 5:27 PM ? ?

## 2021-04-01 NOTE — Progress Notes (Signed)
PROGRESS NOTE    [name]  WUG:[id number] DOB: 1945/02/08 DOA: 03/30/2021 PCP: Elisabeth Cara, PA-C    Brief Narrative:  [name] is a 76 y.o. female past medical history significant for CAD status post PCI, chronic diastolic heart failure prior history of dilated cardiomyopathy ejection fraction returned to normal, paroxysmal A-fib on Coumadin, hypothyroidism, hypertension, morbid obesity who presents complaining of shortness of breath for the last 3 days prior to admission.  Noticed some lower extremity edema.  She also presented for evaluation of chest pain, left side radiating to the right arm associated with right arm numbness.  She describes the pain as sharp, like a lightening.  Chest pain improved   EKG with A-fib  3/2 feeling bit better today. Less sob. No cp  Consultants:  cardiology  Procedures:   Antimicrobials:      Subjective: No dizziness, lightheadedness, or, or other sx  Objective: Vitals:   04/01/21 0017 04/01/21 0529 04/01/21 0818 04/01/21 1357  BP: 128/78 137/85 (!) 117/103 104/80  Pulse: 88 85 89 89  Resp: 18 19 15    Temp: 98 F (36.7 C) 97.6 F (36.4 C) 97.6 F (36.4 C) 97.7 F (36.5 C)  TempSrc: Oral Oral Oral Oral  SpO2: 93% 96% 97% 100%  Weight:  115.9 kg    Height:        Intake/Output Summary (Last 24 hours) at 04/01/2021 1458 Last data filed at 04/01/2021 0530 Gross per 24 hour  Intake 0 ml  Output 1000 ml  Net -1000 ml   Filed Weights   03/30/21 1110 03/31/21 1341 04/01/21 0529  Weight: 121.2 kg 116 kg 115.9 kg    Examination: Calm, NAD Cta no w/r irregs1/s2 no gallop Soft benign +bs Mild LE edema Aaoxox3  Mood and affect appropriate in current setting    Data Reviewed: I have personally reviewed following labs and imaging studies  CBC: Recent Labs  Lab 03/30/21 1117 03/31/21 0545  WBC 7.7 8.5  NEUTROABS 3.6  --   HGB 14.4 15.2*  HCT 44.5 45.6  MCV 91.9 91.0  PLT 491* 479*   Basic  Metabolic Panel: Recent Labs  Lab 03/30/21 1117 03/31/21 0545 04/01/21 0247  NA 139 140 137  K 3.7 3.3* 3.9  CL 101 99 95*  CO2 29 31 34*  GLUCOSE 108* 101* 101*  BUN 11 9 15   CREATININE 1.13* 0.88 1.15*  CALCIUM 8.5* 8.3* 8.8*   GFR: Estimated Creatinine Clearance: 52.8 mL/min (A) (by C-G formula based on SCr of 1.15 mg/dL (H)). Liver Function Tests: Recent Labs  Lab 03/30/21 1117 03/31/21 0545  AST 18 19  ALT 12 12  ALKPHOS 58 46  BILITOT 0.5 1.1  PROT 6.0* 6.0*  ALBUMIN 3.0* 3.0*   No results for input(s): LIPASE, AMYLASE in the last 168 hours. No results for input(s): AMMONIA in the last 168 hours. Coagulation Profile: Recent Labs  Lab 03/30/21 1242 03/31/21 0545 04/01/21 0247  INR 1.9* 2.1* 2.2*   Cardiac Enzymes: No results for input(s): CKTOTAL, CKMB, CKMBINDEX, TROPONINI in the last 168 hours. BNP (last 3 results) No results for input(s): PROBNP in the last 8760 hours. HbA1C: No results for input(s): HGBA1C in the last 72 hours. CBG: No results for input(s): GLUCAP in the last 168 hours. Lipid Profile: Recent Labs    04/01/21 0247  CHOL 213*  HDL 48  LDLCALC 144*  TRIG 106  CHOLHDL 4.4   Thyroid Function Tests: No results for input(s): TSH, T4TOTAL,  FREET4, T3FREE, THYROIDAB in the last 72 hours. Anemia Panel: No results for input(s): VITAMINB12, FOLATE, FERRITIN, TIBC, IRON, RETICCTPCT in the last 72 hours. Sepsis Labs: No results for input(s): PROCALCITON, LATICACIDVEN in the last 168 hours.  Recent Results (from the past 240 hour(s))  Resp Panel by RT-PCR (Flu A&B, Covid) Nasopharyngeal Swab     Status: None   Collection Time: 03/30/21  1:10 PM   Specimen: Nasopharyngeal Swab; Nasopharyngeal(NP) swabs in vial transport medium  Result Value Ref Range Status   SARS Coronavirus 2 by RT PCR NEGATIVE NEGATIVE Final    Comment: (NOTE) SARS-CoV-2 target nucleic acids are NOT DETECTED.  The SARS-CoV-2 RNA is generally detectable in upper  respiratory specimens during the acute phase of infection. The lowest concentration of SARS-CoV-2 viral copies this assay can detect is 138 copies/mL. A negative result does not preclude SARS-Cov-2 infection and should not be used as the sole basis for treatment or other patient management decisions. A negative result may occur with  improper specimen collection/handling, submission of specimen other than nasopharyngeal swab, presence of viral mutation(s) within the areas targeted by this assay, and inadequate number of viral copies(<138 copies/mL). A negative result must be combined with clinical observations, patient history, and epidemiological information. The expected result is Negative.  Fact Sheet for Patients:  EntrepreneurPulse.com.au  Fact Sheet for Healthcare Providers:  IncredibleEmployment.be  This test is no t yet approved or cleared by the [place] FDA and  has been authorized for detection and/or diagnosis of SARS-CoV-2 by FDA under an Emergency Use Authorization (EUA). This EUA will remain  in effect (meaning this test can be used) for the duration of the COVID-19 declaration under Section 564(b)(1) of the Act, 21 U.S.C.section 360bbb-3(b)(1), unless the authorization is terminated  or revoked sooner.       Influenza A by PCR NEGATIVE NEGATIVE Final   Influenza B by PCR NEGATIVE NEGATIVE Final    Comment: (NOTE) The Xpert Xpress SARS-CoV-2/FLU/RSV plus assay is intended as an aid in the diagnosis of influenza from Nasopharyngeal swab specimens and should not be used as a sole basis for treatment. Nasal washings and aspirates are unacceptable for Xpert Xpress SARS-CoV-2/FLU/RSV testing.  Fact Sheet for Patients: EntrepreneurPulse.com.au  Fact Sheet for Healthcare Providers: IncredibleEmployment.be  This test is not yet approved or cleared by the [place] FDA and has been  authorized for detection and/or diagnosis of SARS-CoV-2 by FDA under an Emergency Use Authorization (EUA). This EUA will remain in effect (meaning this test can be used) for the duration of the COVID-19 declaration under Section 564(b)(1) of the Act, 21 U.S.C. section 360bbb-3(b)(1), unless the authorization is terminated or revoked.  Performed at [facility], [place] [address] [place], [place] [address]          Radiology Studies: ECHOCARDIOGRAM COMPLETE  Result Date: 03/31/2021    ECHOCARDIOGRAM REPORT   Patient Name:   MARELLY WEHRMAN Lanzo Date of Exam: 03/31/2021 Medical Rec #:  [id number]          Height:       64.0 in Accession #:    [id number]         Weight:       267.2 lb Date of Birth:  April 19, 1945          BSA:          2.213 m Patient Age:    34 years           BP:  121/71 mmHg Patient Gender: F                  HR:           85 bpm. Exam Location:  Inpatient Procedure: 2D Echo Indications:    Chest pain  History:        Patient has prior history of Echocardiogram examinations, most                 recent 08/17/2020. CHF and Cardiomyopathy, CAD, Arrythmias:Atrial                 Fibrillation; Risk Factors:Hypertension.  Sonographer:    Arlyss Gandy Referring Phys: [id number] Jerald Kief A REGALADO  Sonographer Comments: Image acquisition challenging due to patient body habitus. IMPRESSIONS  1. Left ventricular ejection fraction, by estimation, is 55 to 60%. The left ventricle has normal function. The left ventricle has no regional wall motion abnormalities. Left ventricular diastolic function could not be evaluated.  2. Right ventricular systolic function is moderately reduced. The right ventricular size is mildly enlarged. There is normal pulmonary artery systolic pressure.  3. Right atrial size was moderately dilated.  4. The mitral valve is grossly normal. No evidence of mitral valve regurgitation.  5. The aortic valve is grossly normal. Aortic valve regurgitation is not visualized. No  aortic stenosis is present. FINDINGS  Left Ventricle: Left ventricular ejection fraction, by estimation, is 55 to 60%. The left ventricle has normal function. The left ventricle has no regional wall motion abnormalities. The left ventricular internal cavity size was normal in size. There is  no left ventricular hypertrophy. Left ventricular diastolic function could not be evaluated due to atrial fibrillation. Left ventricular diastolic function could not be evaluated. Right Ventricle: The right ventricular size is mildly enlarged. Right vetricular wall thickness was not well visualized. Right ventricular systolic function is moderately reduced. There is normal pulmonary artery systolic pressure. The tricuspid regurgitant velocity is 2.57 m/s, and with an assumed right atrial pressure of 3 mmHg, the estimated right ventricular systolic pressure is 38.1 mmHg. Left Atrium: Left atrial size was normal in size. Right Atrium: Right atrial size was moderately dilated. Pericardium: There is no evidence of pericardial effusion. Mitral Valve: The mitral valve is grossly normal. No evidence of mitral valve regurgitation. MV peak gradient, 6.5 mmHg. The mean mitral valve gradient is 3.0 mmHg. Tricuspid Valve: The tricuspid valve is grossly normal. Tricuspid valve regurgitation is trivial. Aortic Valve: The aortic valve is grossly normal. Aortic valve regurgitation is not visualized. Aortic regurgitation PHT measures 679 msec. No aortic stenosis is present. Aortic valve mean gradient measures 6.0 mmHg. Aortic valve peak gradient measures 9.6 mmHg. Aortic valve area, by VTI measures 1.39 cm. Pulmonic Valve: The pulmonic valve was grossly normal. Pulmonic valve regurgitation is trivial. Aorta: The aortic root and ascending aorta are structurally normal, with no evidence of dilitation. IAS/Shunts: The atrial septum is grossly normal.  LEFT VENTRICLE PLAX 2D LVIDd:         3.90 cm   Diastology LVIDs:         2.90 cm   LV e' medial:     7.83 cm/s LV PW:         1.10 cm   LV E/e' medial:  12.1 LV IVS:        0.90 cm   LV e' lateral:   7.07 cm/s LVOT diam:     2.00 cm   LV E/e' lateral: 13.5 LV SV:  44 LV SV Index:   20 LVOT Area:     3.14 cm  RIGHT VENTRICLE            IVC RV Basal diam:  3.90 cm    IVC diam: 1.90 cm RV Mid diam:    2.80 cm RV S prime:     7.94 cm/s TAPSE (M-mode): 1.4 cm LEFT ATRIUM             Index        RIGHT ATRIUM           Index LA diam:        3.30 cm 1.49 cm/m   RA Area:     24.50 cm LA Vol (A2C):   96.1 ml 43.43 ml/m  RA Volume:   78.80 ml  35.61 ml/m LA Vol (A4C):   46.9 ml 21.20 ml/m LA Biplane Vol: 69.1 ml 31.23 ml/m  AORTIC VALVE AV Area (Vmax):    1.53 cm AV Area (Vmean):   1.48 cm AV Area (VTI):     1.39 cm AV Vmax:           155.00 cm/s AV Vmean:          113.000 cm/s AV VTI:            0.315 m AV Peak Grad:      9.6 mmHg AV Mean Grad:      6.0 mmHg LVOT Vmax:         75.50 cm/s LVOT Vmean:        53.400 cm/s LVOT VTI:          0.139 m LVOT/AV VTI ratio: 0.44 AI PHT:            679 msec  AORTA Ao Root diam: 2.90 cm Ao Asc diam:  2.90 cm MITRAL VALVE               TRICUSPID VALVE MV Area (PHT): 2.56 cm    TR Peak grad:   26.4 mmHg MV Area VTI:   2.01 cm    TR Vmax:        257.00 cm/s MV Peak grad:  6.5 mmHg MV Mean grad:  3.0 mmHg    SHUNTS MV Vmax:       1.27 m/s    Systemic VTI:  0.14 m MV Vmean:      69.4 cm/s   Systemic Diam: 2.00 cm MV Decel Time: 296 msec MV E velocity: 95.10 cm/s Mertie Moores MD Electronically signed by Mertie Moores MD Signature Date/Time: 03/31/2021/3:32:50 PM    Final         Scheduled Meds:  cholecalciferol  2,000 Units Oral q morning   furosemide  40 mg Intravenous Q12H   isosorbide mononitrate  30 mg Oral Daily   levothyroxine  50 mcg Oral QAC breakfast   metoprolol succinate  25 mg Oral Daily   potassium chloride  40 mEq Oral BID   sodium chloride flush  3 mL Intravenous Q12H   sotalol  120 mg Oral BID   warfarin  5 mg Oral ONCE-1600   Warfarin -  Pharmacist Dosing Inpatient   Does not apply q1600   Continuous Infusions:  sodium chloride      Assessment & Plan:   Principal Problem:   Chest pain Active Problems:   Hypertension   Hypothyroidism (acquired)   Paroxysmal atrial fibrillation (HCC)   Acute on chronic heart failure with preserved ejection fraction (HCC)   CAD (coronary  artery disease)   1-Chest pain, history of CAD: s/p DES to LAD in 2017 3/2 EKG nonischemic, troponin negative Echo normal EF Asymptomatic now No aspirin due to Coumadin Continue Toprol-XL Continue Imdur No further ischemic work-up Goal LDL <70      2-Acute on chronic diastolic heart failure exacerbation: Echo nml EF Continue iv lasix Cards following I/o, daily wt       3-Paroxysmal A-fib:  Continue metoprolol  Continue sotalol  Failed Tikosyn due to cost  3/2 rate better controlled.  Continue sotalol Continue Toprol-XL Continue Coumadin No plans for DCCV     4-Hypothyroidism:  Continue Synthroid  5.Hypokalemia Supplemented and stable     DVT prophylaxis: Coumadin Code Status: Full Family Communication: None at bedside Disposition Plan:  Status is: Observation The patient remains OBS appropriate and will d/c before 2 midnights.              LOS: 0 days   Time spent: 35 minutes with more than 50% on [place], MD [facility] Pager [phone]  If 7PM-7AM, please contact night-coverage 04/01/2021, 2:58 PM

## 2021-04-02 DIAGNOSIS — I10 Essential (primary) hypertension: Secondary | ICD-10-CM | POA: Diagnosis not present

## 2021-04-02 DIAGNOSIS — I2510 Atherosclerotic heart disease of native coronary artery without angina pectoris: Secondary | ICD-10-CM | POA: Diagnosis not present

## 2021-04-02 DIAGNOSIS — R079 Chest pain, unspecified: Secondary | ICD-10-CM | POA: Diagnosis not present

## 2021-04-02 DIAGNOSIS — R0789 Other chest pain: Secondary | ICD-10-CM | POA: Diagnosis not present

## 2021-04-02 DIAGNOSIS — I5033 Acute on chronic diastolic (congestive) heart failure: Secondary | ICD-10-CM | POA: Diagnosis not present

## 2021-04-02 LAB — BASIC METABOLIC PANEL
Anion gap: 9 (ref 5–15)
BUN: 19 mg/dL (ref 8–23)
CO2: 29 mmol/L (ref 22–32)
Calcium: 8.8 mg/dL — ABNORMAL LOW (ref 8.9–10.3)
Chloride: 98 mmol/L (ref 98–111)
Creatinine, Ser: 1.13 mg/dL — ABNORMAL HIGH (ref 0.44–1.00)
GFR, Estimated: 51 mL/min — ABNORMAL LOW (ref >60)
Glucose, Bld: 92 mg/dL (ref 70–99)
Potassium: 4.8 mmol/L (ref 3.5–5.1)
Sodium: 136 mmol/L (ref 135–145)

## 2021-04-02 LAB — PROTIME-INR
INR: 2 — ABNORMAL HIGH (ref 0.8–1.2)
Prothrombin Time: 22.6 seconds — ABNORMAL HIGH (ref 11.4–15.2)

## 2021-04-02 NOTE — Plan of Care (Signed)
  Problem: Activity: Goal: Risk for activity intolerance will decrease Outcome: Progressing   

## 2021-04-02 NOTE — Care Management (Signed)
04-02-21 1144 Case Manager spoke with patient regarding home health needs and the patient is declining home health PT/OT services. Patient states she has used [facility] in the past and she did not benefit from the services. Case Manager made the Staff RN and MD aware. Per patient, her daughter will provide transportation home via private vehicle. No further needs from Case Manager at this time.  ?

## 2021-04-02 NOTE — Progress Notes (Addendum)
Progress Note  Patient Name: [name] Date of Encounter: 04/02/2021  [facility] Cardiologist: [name], MD   Subjective   No acute overnight events. Patient denies any recurrent chest pain. No significant shortness of breath. She states she feels better than usual and is very eager to go home. She was able to ambulate with the nurse yesterday and states she did well with this.  Inpatient Medications    Scheduled Meds:  cholecalciferol  2,000 Units Oral q morning   furosemide  40 mg Intravenous Q12H   isosorbide mononitrate  30 mg Oral Daily   levothyroxine  50 mcg Oral QAC breakfast   metoprolol succinate  25 mg Oral Daily   sodium chloride flush  3 mL Intravenous Q12H   sotalol  120 mg Oral BID   Warfarin - Pharmacist Dosing Inpatient   Does not apply q1600   Continuous Infusions:  sodium chloride     PRN Meds: sodium chloride, ondansetron **OR** ondansetron (ZOFRAN) IV, polyethylene glycol, sodium chloride flush   Vital Signs    Vitals:   04/01/21 1357 04/01/21 2011 04/02/21 0500 04/02/21 0530  BP: 104/80 106/76  123/89  Pulse: 89 77  81  Resp:  16  18  Temp: 97.7 F (36.5 C) 98 F (36.7 C)  (!) 97.5 F (36.4 C)  TempSrc: Oral Oral  Oral  SpO2: 100% 94%  96%  Weight:   115.3 kg   Height:        Intake/Output Summary (Last 24 hours) at 04/02/2021 0735 Last data filed at 04/02/2021 0600 Gross per 24 hour  Intake --  Output 1600 ml  Net -1600 ml   Last 3 Weights 04/02/2021 04/01/2021 03/31/2021  Weight (lbs) 254 lb 1.6 oz 255 lb 8 oz 255 lb 12.8 oz  Weight (kg) 115.259 kg 115.894 kg 116.03 kg      Telemetry    Atrial fibrillation with occasional PVCs/couplets. Rates in the 80s. - Personally Reviewed  ECG    No new ECG tracing today. - Personally Reviewed  Physical Exam   GEN: No acute distress.   Neck: No JVD. Cardiac: Irregularly irregular rhythm with normal rate. No murmurs, rubs, or gallops.  Respiratory: Clear to auscultation  bilaterally. No wheezes, rhonchi, or rales. GI: Soft, non-distended, and non-tender. MS: Trace lower extremity edema bilaterally. No deformity. Skin: Warm and dry. Neuro:  No focal deficits.  Psych: Normal affect. Responds appropriately   Labs    High Sensitivity Troponin:   Recent Labs  Lab 03/30/21 1117 03/30/21 1753  TROPONINIHS 7 7     Chemistry Recent Labs  Lab 03/30/21 1117 03/31/21 0545 04/01/21 0247 04/02/21 0249  NA 139 140 137 136  K 3.7 3.3* 3.9 4.8  CL 101 99 95* 98  CO2 29 31 34* 29  GLUCOSE 108* 101* 101* 92  BUN 11 9 15 19   CREATININE 1.13* 0.88 1.15* 1.13*  CALCIUM 8.5* 8.3* 8.8* 8.8*  PROT 6.0* 6.0*  --   --   ALBUMIN 3.0* 3.0*  --   --   AST 18 19  --   --   ALT 12 12  --   --   ALKPHOS 58 46  --   --   BILITOT 0.5 1.1  --   --   GFRNONAA 51* >60 50* 51*  ANIONGAP 9 10 8 9     Lipids  Recent Labs  Lab 04/01/21 0247  CHOL 213*  TRIG 106  HDL 48  LDLCALC  144*  CHOLHDL 4.4    Hematology Recent Labs  Lab 03/30/21 1117 03/31/21 0545  WBC 7.7 8.5  RBC 4.84 5.01  HGB 14.4 15.2*  HCT 44.5 45.6  MCV 91.9 91.0  MCH 29.8 30.3  MCHC 32.4 33.3  RDW 15.9* 15.9*  PLT 491* 479*   Thyroid No results for input(s): TSH, FREET4 in the last 168 hours.  BNPNo results for input(s): BNP, PROBNP in the last 168 hours.  DDimer No results for input(s): DDIMER in the last 168 hours.   Radiology    ECHOCARDIOGRAM COMPLETE  Result Date: 03/31/2021    ECHOCARDIOGRAM REPORT   Patient Name:   [name] Date of Exam: 03/31/2021 Medical Rec #:  [id number]          Height:       64.0 in Accession #:    [id number]         Weight:       267.2 lb Date of Birth:  February 25, 1945          BSA:          2.213 m Patient Age:    37 years           BP:           121/71 mmHg Patient Gender: F                  HR:           85 bpm. Exam Location:  Inpatient Procedure: 2D Echo Indications:    Chest pain  History:        Patient has prior history of Echocardiogram  examinations, most                 recent 08/17/2020. CHF and Cardiomyopathy, CAD, Arrythmias:Atrial                 Fibrillation; Risk Factors:Hypertension.  Sonographer:    Arlyss Gandy Referring Phys: [id number] Jerald Kief A REGALADO  Sonographer Comments: Image acquisition challenging due to patient body habitus. IMPRESSIONS  1. Left ventricular ejection fraction, by estimation, is 55 to 60%. The left ventricle has normal function. The left ventricle has no regional wall motion abnormalities. Left ventricular diastolic function could not be evaluated.  2. Right ventricular systolic function is moderately reduced. The right ventricular size is mildly enlarged. There is normal pulmonary artery systolic pressure.  3. Right atrial size was moderately dilated.  4. The mitral valve is grossly normal. No evidence of mitral valve regurgitation.  5. The aortic valve is grossly normal. Aortic valve regurgitation is not visualized. No aortic stenosis is present. FINDINGS  Left Ventricle: Left ventricular ejection fraction, by estimation, is 55 to 60%. The left ventricle has normal function. The left ventricle has no regional wall motion abnormalities. The left ventricular internal cavity size was normal in size. There is  no left ventricular hypertrophy. Left ventricular diastolic function could not be evaluated due to atrial fibrillation. Left ventricular diastolic function could not be evaluated. Right Ventricle: The right ventricular size is mildly enlarged. Right vetricular wall thickness was not well visualized. Right ventricular systolic function is moderately reduced. There is normal pulmonary artery systolic pressure. The tricuspid regurgitant velocity is 2.57 m/s, and with an assumed right atrial pressure of 3 mmHg, the estimated right ventricular systolic pressure is 82.8 mmHg. Left Atrium: Left atrial size was normal in size. Right Atrium: Right atrial size was moderately dilated. Pericardium: There is no evidence of  pericardial  effusion. Mitral Valve: The mitral valve is grossly normal. No evidence of mitral valve regurgitation. MV peak gradient, 6.5 mmHg. The mean mitral valve gradient is 3.0 mmHg. Tricuspid Valve: The tricuspid valve is grossly normal. Tricuspid valve regurgitation is trivial. Aortic Valve: The aortic valve is grossly normal. Aortic valve regurgitation is not visualized. Aortic regurgitation PHT measures 679 msec. No aortic stenosis is present. Aortic valve mean gradient measures 6.0 mmHg. Aortic valve peak gradient measures 9.6 mmHg. Aortic valve area, by VTI measures 1.39 cm. Pulmonic Valve: The pulmonic valve was grossly normal. Pulmonic valve regurgitation is trivial. Aorta: The aortic root and ascending aorta are structurally normal, with no evidence of dilitation. IAS/Shunts: The atrial septum is grossly normal.  LEFT VENTRICLE PLAX 2D LVIDd:         3.90 cm   Diastology LVIDs:         2.90 cm   LV e' medial:    7.83 cm/s LV PW:         1.10 cm   LV E/e' medial:  12.1 LV IVS:        0.90 cm   LV e' lateral:   7.07 cm/s LVOT diam:     2.00 cm   LV E/e' lateral: 13.5 LV SV:         44 LV SV Index:   20 LVOT Area:     3.14 cm  RIGHT VENTRICLE            IVC RV Basal diam:  3.90 cm    IVC diam: 1.90 cm RV Mid diam:    2.80 cm RV S prime:     7.94 cm/s TAPSE (M-mode): 1.4 cm LEFT ATRIUM             Index        RIGHT ATRIUM           Index LA diam:        3.30 cm 1.49 cm/m   RA Area:     24.50 cm LA Vol (A2C):   96.1 ml 43.43 ml/m  RA Volume:   78.80 ml  35.61 ml/m LA Vol (A4C):   46.9 ml 21.20 ml/m LA Biplane Vol: 69.1 ml 31.23 ml/m  AORTIC VALVE AV Area (Vmax):    1.53 cm AV Area (Vmean):   1.48 cm AV Area (VTI):     1.39 cm AV Vmax:           155.00 cm/s AV Vmean:          113.000 cm/s AV VTI:            0.315 m AV Peak Grad:      9.6 mmHg AV Mean Grad:      6.0 mmHg LVOT Vmax:         75.50 cm/s LVOT Vmean:        53.400 cm/s LVOT VTI:          0.139 m LVOT/AV VTI ratio: 0.44 AI PHT:             679 msec  AORTA Ao Root diam: 2.90 cm Ao Asc diam:  2.90 cm MITRAL VALVE               TRICUSPID VALVE MV Area (PHT): 2.56 cm    TR Peak grad:   26.4 mmHg MV Area VTI:   2.01 cm    TR Vmax:        257.00 cm/s MV Peak grad:  6.5  mmHg MV Mean grad:  3.0 mmHg    SHUNTS MV Vmax:       1.27 m/s    Systemic VTI:  0.14 m MV Vmean:      69.4 cm/s   Systemic Diam: 2.00 cm MV Decel Time: 296 msec MV E velocity: 95.10 cm/s Mertie Moores MD Electronically signed by Mertie Moores MD Signature Date/Time: 03/31/2021/3:32:50 PM    Final     Cardiac Studies   Echocardiogram 03/31/2021: Impressions:  1. Left ventricular ejection fraction, by estimation, is 55 to 60%. The  left ventricle has normal function. The left ventricle has no regional  wall motion abnormalities. Left ventricular diastolic function could not  be evaluated.   2. Right ventricular systolic function is moderately reduced. The right  ventricular size is mildly enlarged. There is normal pulmonary artery  systolic pressure.   3. Right atrial size was moderately dilated.   4. The mitral valve is grossly normal. No evidence of mitral valve  regurgitation.   5. The aortic valve is grossly normal. Aortic valve regurgitation is not  visualized. No aortic stenosis is present.   Patient Profile     76 y.o. female with a history of CAD s/p DES to LAD in 04/8754, chronic diastolic CHF, paroxysmal/persistent atrial fibrillation on Coumadin, hypertension, hyperlipidemia, type 2 diabetes mellitus, and obesity who was admitted on 03/30/2021 with atypical chest pain and acute on chronic diastolic CHF.  Assessment & Plan    Chest Pain CAD History of CAD s/p DES to LAD in 2017. Patient presented with one episode of atypical chest pain that she describes as sharp pain that short from her sternum to left hand. EKG showed no acute ischemic changes. High-sensitivity troponin negative x2. Echo showed normal LV function and wall motion. - No recurrent pain.  - No  aspirin due to Coumadin. - Continue home Imdur 30mg  daily and Toprol-XL 25mg  daily.  - Previously intolerant to a statin and does not want to retry. - Symptoms very atypical. No additional ischemic work-up necessary.   Acute on Chronic Diastolic CHF Patient presented with worsening dyspnea on exertion and fatigue for 2 weeks. Chest x-ray showed mild central pulmonary vascular congestion. Echo showed LVEF of 55-60% with normal wall motion. RV mildly enlarged with moderately reduced systolic function but PASP normal. Started on IV Lasix with good urinary response. No documented output but 7.6L of output this admission. Weight 254 lbs today, down 13 lbs from admission (she was 282 lbs at last office visit in 05/2020). Renal function stable. - Does not appear grossly volume overloaded on exam.  - Suspect we can switch back to PO Lasix but will discuss with MD. - Continue to monitor daily weights, strict I/Os, and renal function.   Persistent Atrial Fibrillation Failed Tikosyn due to cost so has been on Sotalol. - Rates well controlled. - Continue Toprol-XL 25mg  daily. - Continue Sotalol 120mg  twice daily. - Continue chronic anticoagulation with Coumadin. INR 2.0 today. Dosing per Pharmacy.  - No plans for DCCV at this time. Dr. Marlou Porch has discussed this at length with patient and it is not felt that restoration of sinus rhythm will make a huge impact overall.   Hypertension BP well controlled. - Continue Imdur and Toprol-XL as above.   Hyperlipidemia Patient has hyperlipidemia listed in chart but is not on any medications. Lipid panel this admission showed Total Cholesterol 213, Triglycerides 106, HDL 48, LDL 144.  - Patient states she previously was on a statin (cannot remember  which one) and had significant myalgias with this. She does not want to retry a statin and does not want to be on more medications.   Otherwise, per primary team: - Type 2 diabetes mellitus - Generalized fatigue  -  Hypothyroidism  For questions or updates, please contact [facility] Please consult [email or web address] for contact info under        Signed, Darreld Mclean, PA-C  04/02/2021, 7:35 AM    Personally seen and examined. Agree with above.  76 year old with acute on chronic diastolic heart failure known CAD persistent atrial fibrillation on sotalol  She is eager to go home.  Felt some dizziness, slight hypotension.  Dizziness has been a longstanding issue for her off and on.  She has had this with normal blood pressures as well.  She is down 13 pounds from admission, 254 currently.  I am comfortable with her discharge.  I would like for her to be on home Lasix 40 mg once a day.  She can start this tomorrow.  If her weight increases, she may double.  We will have close clinic follow-up on the 23rd.  [name], MD

## 2021-04-02 NOTE — Progress Notes (Signed)
ANTICOAGULATION CONSULT NOTE ? ?Pharmacy Consult for Warfarin ?Indication: atrial fibrillation ? ?Patient Measurements: ?Height: 5\' 4"  (162.6 cm) ?Weight: 115.3 kg (254 lb 1.6 oz) ?IBW/kg (Calculated) : 54.7 ? ?Vital Signs: ?Temp: 97.5 ?F (36.4 ?C) (03/03 0530) ?Temp Source: Oral (03/03 0530) ?BP: 143/102 (03/03 1007) ?Pulse Rate: 81 (03/03 0530) ? ?Labs: ?Recent Labs  ?  03/30/21 ?1753 03/31/21 ?3568 04/01/21 ?6168 04/02/21 ?0249  ?HGB  --  15.2*  --   --   ?HCT  --  45.6  --   --   ?PLT  --  479*  --   --   ?LABPROT  --  23.3* 24.2* 22.6*  ?INR  --  2.1* 2.2* 2.0*  ?CREATININE  --  0.88 1.15* 1.13*  ?TROPONINIHS 7  --   --   --   ? ? ?Estimated Creatinine Clearance: 53.6 mL/min (A) (by C-G formula based on SCr of 1.13 mg/dL (H)). ? ?Assessment: ?67 YOF presenting with CP, hx of afib on warfarin PTA, last dose taken 2/28. INR today is therapeutic at 20.  ? ?PTA dosing: 5mg  daily, except 7.5mg  Tuesdays ? ?Goal of Therapy:  ?INR 2-3 ?Monitor platelets by anticoagulation protocol: Yes ?  ?Plan:  ?Warfarin  5mg  daily, except 7.5mg  Tuesdays ?INR MWF only ? ?Hildred Laser, PharmD ?Clinical Pharmacist ?**Pharmacist phone directory can now be found on [email or web address] ([email or web address]).  Listed under [place]. ? ? ? ? ? ?

## 2021-04-02 NOTE — Evaluation (Signed)
Occupational Therapy Evaluation ?Patient Details ?Name: [name] ?MRN: [id number] ?DOB: [birth year] ?Today's Date: 04/02/2021 ? ? ?History of Present Illness 76 yo female presenting 2/28 with chest pain that improved with nitroglycerin. Found to have acute CHF exacerbation. PMH includes CAD s/p DES, CHF, afib on warfarin, thrombotic thrombocytopenic purpura, HTN, hypothyroidism, and morbid obesity.  ? ?Clinical Impression ?  ?PTA, pt was living alone and performed ADLs; has assistance from famiyl and paid help for IADLs. Pt reports her family is planning to stay at dc for increased support. Pt agreeable to sit at EOB however, declining ADLs. Pt reporting she feels at baseline. Providing education and handout on energy conservation for ADLs and pt verbalized ways she can implement into everyday routine. Pt planning for dc later today and declined follow up therapy.  ?   ? ?Recommendations for follow up therapy are one component of a multi-disciplinary discharge planning process, led by the attending physician.  Recommendations may be updated based on patient status, additional functional criteria and insurance authorization.  ? ?Follow Up Recommendations ? No OT follow up (Pt declining follow up therapy)  ?  ?Assistance Recommended at Discharge Frequent or constant Supervision/Assistance  ?Patient can return home with the following A little help with walking and/or transfers;A little help with bathing/dressing/bathroom ? ?  ?Functional Status Assessment ? Patient has had a recent decline in their functional status and demonstrates the ability to make significant improvements in function in a reasonable and predictable amount of time.  ?Equipment Recommendations ? None recommended by OT  ?  ?Recommendations for Other Services   ? ? ?  ?Precautions / Restrictions Precautions ?Precautions: Fall ?Restrictions ?Weight Bearing Restrictions: No  ? ?  ? ?Mobility Bed Mobility ?Overal bed mobility: Modified Independent ?   ?  ?  ?  ?  ?  ?General bed mobility comments: use of rail with pt able to transition to sitting without physical assist ?  ? ?Transfers ?  ?  ?  ?  ?  ?  ?  ?  ?  ?General transfer comment: defered ?  ? ?  ?Balance Overall balance assessment: Needs assistance ?Sitting-balance support: No upper extremity supported ?Sitting balance-Leahy Scale: Fair ?  ?  ?  ?  ?  ?  ?  ?  ?  ?  ?  ?  ?  ?  ?  ?  ?   ? ?ADL either performed or assessed with clinical judgement  ? ?ADL   ?  ?  ?  ?  ?  ?  ?  ?  ?  ?  ?  ?  ?  ?  ?  ?  ?  ?  ?  ?General ADL Comments: Declined ADLs at this time. Reports she is at baseline. Provided handout and education on energy conservation for ADLs and simple IADLs.  ? ? ? ?Vision   ?   ?   ?Perception   ?  ?Praxis   ?  ? ?Pertinent Vitals/Pain Pain Assessment ?Pain Assessment: No/denies pain  ? ? ? ?Hand Dominance Right ?  ?Extremity/Trunk Assessment Upper Extremity Assessment ?Upper Extremity Assessment: Generalized weakness ?  ?Lower Extremity Assessment ?Lower Extremity Assessment: Defer to PT evaluation ?  ?Cervical / Trunk Assessment ?Cervical / Trunk Assessment: Other exceptions ?Cervical / Trunk Exceptions: large body habitus ?  ?Communication Communication ?Communication: No difficulties ?  ?Cognition Arousal/Alertness: Awake/alert ?Behavior During Therapy: [facility] for tasks assessed/performed ?Overall Cognitive Status: Impaired/Different from baseline ?Area of  Impairment: Safety/judgement ?  ?  ?  ?  ?  ?  ?  ?  ?  ?  ?  ?  ?Safety/Judgement: Decreased awareness of deficits, Decreased awareness of safety ?  ?  ?General Comments: Feel she is at baseline cognition ?  ?  ?General Comments  BP 106/65 whil seated at EOB ? ?  ?Exercises   ?  ?Shoulder Instructions    ? ? ?Home Living Family/patient expects to be discharged to:: Private residence ?Living Arrangements: Alone ?Available Help at Discharge: Family;Available PRN/intermittently ?Type of Home: House ?Home Access: Level entry ?  ?  ?Home  Layout: One level ?  ?  ?Bathroom Shower/Tub: Tub/shower unit;Curtain ?  ?Bathroom Toilet: Standard ?Bathroom Accessibility: No ?  ?Home Equipment: Conservation officer, nature (2 wheels);Shower seat;Grab bars - toilet;Grab bars - tub/shower ?  ?Additional Comments: Pt reports she has never opened her RW, but has access at home. bathroom is small and she can hold onto other objects while moving ?  ? ?  ?Prior Functioning/Environment Prior Level of Function : Independent/Modified Independent ?  ?  ?  ?  ?  ?  ?Mobility Comments: Reports she fatigues quickly with mobility. Reports she has places to sit and rest all aroudn her home. Does not use DME ?ADLs Comments: Pt states she performs ADLs with increased time. Pays someone to clean her home. Drive short distances. Her kids perform grocery shopping or cooking. ?  ? ?  ?  ?OT Problem List: Decreased range of motion;Decreased activity tolerance;Impaired balance (sitting and/or standing);Decreased knowledge of use of DME or AE;Decreased knowledge of precautions ?  ?   ?OT Treatment/Interventions:    ?  ?OT Goals(Current goals can be found in the care plan section) Acute Rehab OT Goals ?Patient Stated Goal: Go home later today ?OT Goal Formulation: All assessment and education complete, DC therapy  ?OT Frequency:   ?  ? ?Co-evaluation   ?  ?  ?  ?  ? ?  ?AM-PAC OT "6 Clicks" Daily Activity     ?Outcome Measure Help from another person eating meals?: None ?Help from another person taking care of personal grooming?: A Little ?Help from another person toileting, which includes using toliet, bedpan, or urinal?: A Little ?Help from another person bathing (including washing, rinsing, drying)?: A Lot ?Help from another person to put on and taking off regular upper body clothing?: A Little ?Help from another person to put on and taking off regular lower body clothing?: A Lot ?6 Click Score: 17 ?  ?End of Session Nurse Communication: Mobility status ? ?Activity Tolerance: Patient tolerated  treatment well ?Patient left: in bed;with call bell/phone within reach ? ?OT Visit Diagnosis: Unsteadiness on feet (R26.81);Other abnormalities of gait and mobility (R26.89);Muscle weakness (generalized) (M62.81)  ?              ?Time: 6073-7106 ?OT Time Calculation (min): 22 min ?Charges:  OT General Charges ?$OT Visit: 1 Visit ?OT Evaluation ?$OT Eval Moderate Complexity: 1 Mod ? ?[facility] MSOT, OTR/L ?Acute Rehab ?Pager: [phone] ?Office: [phone] ? ?Taysha Majewski M Mckade Gurka ?04/02/2021, 1:32 PM ?

## 2021-04-02 NOTE — Progress Notes (Signed)
PT Cancellation Note ? ?Patient Details ?Name: [name] ?MRN: [id number] ?DOB: March 13, 1945 ? ? ?Cancelled Treatment:    Reason Eval/Treat Not Completed: Patient declined, no reason specified (pt stating too fatigued after getting to the bathroom to participate) ? ? ?Vang Kraeger B Brocha Gilliam ?04/02/2021, 9:04 AM ?Vivienne Sangiovanni P, PT ?Acute Rehabilitation Services ?Pager: [phone] ?Office: [phone] ? ?

## 2021-04-02 NOTE — Discharge Summary (Signed)
[name]:[id number] DOB: Jan 09, 1946 DOA: 03/30/2021  PCP: Elisabeth Cara, PA-C  Admit date: 03/30/2021 Discharge date: 04/02/2021  Admitted From: Home Disposition: Home  Recommendations for Outpatient Follow-up:  Follow up with PCP in 1 week Please obtain BMP/CBC in one week Please follow up with cardiology as scheduled  Home Health: Declined   Discharge Condition:Stable CODE STATUS: Full Diet recommendation: Heart Healthy   Brief/Interim Summary: Per CZY:SAYTKZS [name] is a 76 y.o. female past medical history significant for CAD status post PCI, chronic diastolic heart failure prior history of dilated cardiomyopathy ejection fraction returned to normal, paroxysmal A-fib on Coumadin, hypothyroidism, hypertension, morbid obesity who presents complaining of shortness of breath for the last 3 days prior to admission.  Noticed some lower extremity edema.  She also presented for evaluation of chest pain.  EKG with atrial fibrillation.  Cardiology was consulted.  She was started on IV Lasix.  Troponin was negative.  There was no plans for further ischemic work-up.  Echo revealed normal EF.  She was transition to p.o. Lasix today.  And was stable to be discharged home with follow-up with cardiology.   1-Chest pain, history of CAD: s/p DES to LAD in 2017 EKG nonischemic, troponin negative Echo normal EF Asymptomatic now No aspirin due to Coumadin Continue Toprol-XL Continue Imdur No further ischemic work-up Goal LDL <70         2-Acute on chronic diastolic heart failure exacerbation: Echo nml EF Started on  iv lasix to po on discharge Down 13lbs from admission. Clinically improved F/u with cardiology          3-Persistent A-fib:  Failed Tikosyn due to cost so has been on Sotalol. - Rates well controlled. - Continue Toprol-XL 25mg  daily. - Continue Sotalol 120mg  twice daily. - Continue chronic anticoagulation with Coumadin.  INR therapeutic  No  plans for DCCV at this time. Dr. Marlou Porch has discussed this at length with patient and it is not felt that restoration of sinus rhythm will make a huge impact overall     4-Hypothyroidism:  Continue Synthroid   5.Hypokalemia Supplemented and stable        Discharge Diagnoses:  Principal Problem:   Chest pain Active Problems:   Hypertension   Hypothyroidism (acquired)   Paroxysmal atrial fibrillation (HCC)   Acute on chronic heart failure with preserved ejection fraction (HCC)   CAD (coronary artery disease)    Discharge Instructions  Discharge Instructions     Call MD for:  difficulty breathing, headache or visual disturbances   Complete by: As directed    Diet - low sodium heart healthy   Complete by: As directed    Discharge instructions   Complete by: As directed    Start your lasix tomorrow . Weight daily if your weight up call cardiology and double your lasix as directed . F/u with Dr. Marlou Porch.   Increase activity slowly   Complete by: As directed       Allergies as of 04/02/2021       Reactions   Ceftriaxone Rash, Other (See Comments)   "TTP"/Was hospitalized and in a coma for 60 days, per patient (Thrombotic Thrombocytopenic Purpura)   Penicillins Hives, Rash, Other (See Comments)   Cannot tolerate any "-CILLINS"; causes welts!! Has patient had a PCN reaction causing immediate rash, facial/tongue/throat swelling, SOB or lightheadedness with hypotension: Yes Has patient had a PCN reaction causing severe rash involving mucus membranes or skin necrosis: No Has patient had a PCN reaction  that required hospitalization No Has patient had a PCN reaction occurring within the last 10 years: No If all of the above answers are "NO", then may proceed with Cephalosporin use.   Levofloxacin Other (See Comments)   Thrombocytopenia    Levofloxacin Other (See Comments)   Dropped blood platelets extremely low   Oxycodone Itching   Tape Other (See Comments)   Prefers paper  or cloth tape Other reaction(s): Other (See Comments) Prefers paper or cloth tape Other reaction(s): Other (See Comments) Prefers paper or cloth tape   Alendronate Rash   Aspirin Other (See Comments)   Patient stated she is unable to take due to blood thinner   Oxycodone-acetaminophen Itching   Penicillin G Rash        Medication List     TAKE these medications    furosemide 40 MG tablet Commonly known as: LASIX TAKE 1 TABLET BY MOUTH EVERY DAY   isosorbide mononitrate 30 MG 24 hr tablet Commonly known as: IMDUR TAKE 1 TABLET BY MOUTH EVERY DAY   levothyroxine 50 MCG tablet Commonly known as: SYNTHROID Take 50 mcg by mouth daily before breakfast.   metoprolol succinate 25 MG 24 hr tablet Commonly known as: TOPROL-XL TAKE 1 TABLET BY MOUTH DAILY. TAKE WITH OR IMMEDIATELY FOLLOWING A MEAL.   nitroGLYCERIN 0.4 MG SL tablet Commonly known as: NITROSTAT PLACE 1 TABLET (0.4 MG TOTAL) UNDER THE TONGUE EVERY 5 (FIVE) MINUTES AS NEEDED FOR CHEST PAIN.   potassium chloride 10 MEQ tablet Commonly known as: KLOR-CON M Take 10 mEq by mouth daily.   sotalol 120 MG tablet Commonly known as: BETAPACE TAKE 1 TABLET BY MOUTH 2 TIMES DAILY.   Vitamin D3 50 MCG (2000 UT) capsule Take 2,000 Units by mouth every morning.   warfarin 5 MG tablet Commonly known as: COUMADIN Take as directed. If you are unsure how to take this medication, talk to your nurse or doctor. Original instructions: Take 1 tablet by mouth daily except 1.5 tablets on Tuesdays and Fridays or as directed by Anticoagulation Clinic. What changed:  how much to take how to take this when to take this        Follow-up Information     [place], [place], PA Follow up.   Specialty: Cardiology Why: Hospital follow-up with Cardiology scheduled for 04/22/2021 at 9:15am. Please arrive 15 minutes early for check-in. If this date/time does not work for you, please give our office a call to reschedule. Contact  information: [address] [place] [address] [phone]         [place], Virginia E, PA-C Follow up in 1 week(s).   Specialty: Family Medicine Contact information: [address] [place] [address] [phone]                Allergies  Allergen Reactions   Ceftriaxone Rash and Other (See Comments)    "TTP"/Was hospitalized and in a coma for 60 days, per patient (Thrombotic Thrombocytopenic Purpura)   Penicillins Hives, Rash and Other (See Comments)    Cannot tolerate any "-CILLINS"; causes welts!! Has patient had a PCN reaction causing immediate rash, facial/tongue/throat swelling, SOB or lightheadedness with hypotension: Yes Has patient had a PCN reaction causing severe rash involving mucus membranes or skin necrosis: No Has patient had a PCN reaction that required hospitalization No Has patient had a PCN reaction occurring within the last 10 years: No If all of the above answers are "NO", then may proceed with Cephalosporin  use.   Levofloxacin Other (See Comments)    Thrombocytopenia     Levofloxacin Other (See Comments)    Dropped blood platelets extremely low   Oxycodone Itching   Tape Other (See Comments)    Prefers paper or cloth tape Other reaction(s): Other (See Comments) Prefers paper or cloth tape Other reaction(s): Other (See Comments) Prefers paper or cloth tape   Alendronate Rash   Aspirin Other (See Comments)    Patient stated she is unable to take due to blood thinner    Oxycodone-Acetaminophen Itching   Penicillin G Rash    Consultations: cardiology   Procedures/Studies: DG Chest 2 View  Result Date: 03/30/2021 CLINICAL DATA:  Chest pain, shortness of breath. EXAM: CHEST - 2 VIEW COMPARISON:  August 15, 2020. FINDINGS: Stable cardiomegaly with mild central pulmonary vascular congestion. No definite consolidative process is noted. Bony thorax is unremarkable. IMPRESSION: Stable cardiomegaly with mild  central pulmonary vascular congestion. Electronically Signed   By: Marijo Conception M.D.   On: 03/30/2021 11:43   ECHOCARDIOGRAM COMPLETE  Result Date: 03/31/2021    ECHOCARDIOGRAM REPORT   Patient Name:   [name] Mendell Date of Exam: 03/31/2021 Medical Rec #:  [id number]          Height:       64.0 in Accession #:    [id number]         Weight:       267.2 lb Date of Birth:  [birth year]          BSA:          2.213 m Patient Age:    8 years           BP:           121/71 mmHg Patient Gender: F                  HR:           85 bpm. Exam Location:  Inpatient Procedure: 2D Echo Indications:    Chest pain  History:        Patient has prior history of Echocardiogram examinations, most                 recent 08/17/2020. CHF and Cardiomyopathy, CAD, Arrythmias:Atrial                 Fibrillation; Risk Factors:Hypertension.  Sonographer:    Arlyss Gandy Referring Phys: [id number] Jerald Kief A REGALADO  Sonographer Comments: Image acquisition challenging due to patient body habitus. IMPRESSIONS  1. Left ventricular ejection fraction, by estimation, is 55 to 60%. The left ventricle has normal function. The left ventricle has no regional wall motion abnormalities. Left ventricular diastolic function could not be evaluated.  2. Right ventricular systolic function is moderately reduced. The right ventricular size is mildly enlarged. There is normal pulmonary artery systolic pressure.  3. Right atrial size was moderately dilated.  4. The mitral valve is grossly normal. No evidence of mitral valve regurgitation.  5. The aortic valve is grossly normal. Aortic valve regurgitation is not visualized. No aortic stenosis is present. FINDINGS  Left Ventricle: Left ventricular ejection fraction, by estimation, is 55 to 60%. The left ventricle has normal function. The left ventricle has no regional wall motion abnormalities. The left ventricular internal cavity size was normal in size. There is  no left ventricular hypertrophy. Left ventricular  diastolic function could not be evaluated due to atrial fibrillation. Left ventricular diastolic function could not be  evaluated. Right Ventricle: The right ventricular size is mildly enlarged. Right vetricular wall thickness was not well visualized. Right ventricular systolic function is moderately reduced. There is normal pulmonary artery systolic pressure. The tricuspid regurgitant velocity is 2.57 m/s, and with an assumed right atrial pressure of 3 mmHg, the estimated right ventricular systolic pressure is 14.7 mmHg. Left Atrium: Left atrial size was normal in size. Right Atrium: Right atrial size was moderately dilated. Pericardium: There is no evidence of pericardial effusion. Mitral Valve: The mitral valve is grossly normal. No evidence of mitral valve regurgitation. MV peak gradient, 6.5 mmHg. The mean mitral valve gradient is 3.0 mmHg. Tricuspid Valve: The tricuspid valve is grossly normal. Tricuspid valve regurgitation is trivial. Aortic Valve: The aortic valve is grossly normal. Aortic valve regurgitation is not visualized. Aortic regurgitation PHT measures 679 msec. No aortic stenosis is present. Aortic valve mean gradient measures 6.0 mmHg. Aortic valve peak gradient measures 9.6 mmHg. Aortic valve area, by VTI measures 1.39 cm. Pulmonic Valve: The pulmonic valve was grossly normal. Pulmonic valve regurgitation is trivial. Aorta: The aortic root and ascending aorta are structurally normal, with no evidence of dilitation. IAS/Shunts: The atrial septum is grossly normal.  LEFT VENTRICLE PLAX 2D LVIDd:         3.90 cm   Diastology LVIDs:         2.90 cm   LV e' medial:    7.83 cm/s LV PW:         1.10 cm   LV E/e' medial:  12.1 LV IVS:        0.90 cm   LV e' lateral:   7.07 cm/s LVOT diam:     2.00 cm   LV E/e' lateral: 13.5 LV SV:         44 LV SV Index:   20 LVOT Area:     3.14 cm  RIGHT VENTRICLE            IVC RV Basal diam:  3.90 cm    IVC diam: 1.90 cm RV Mid diam:    2.80 cm RV S prime:     7.94  cm/s TAPSE (M-mode): 1.4 cm LEFT ATRIUM             Index        RIGHT ATRIUM           Index LA diam:        3.30 cm 1.49 cm/m   RA Area:     24.50 cm LA Vol (A2C):   96.1 ml 43.43 ml/m  RA Volume:   78.80 ml  35.61 ml/m LA Vol (A4C):   46.9 ml 21.20 ml/m LA Biplane Vol: 69.1 ml 31.23 ml/m  AORTIC VALVE AV Area (Vmax):    1.53 cm AV Area (Vmean):   1.48 cm AV Area (VTI):     1.39 cm AV Vmax:           155.00 cm/s AV Vmean:          113.000 cm/s AV VTI:            0.315 m AV Peak Grad:      9.6 mmHg AV Mean Grad:      6.0 mmHg LVOT Vmax:         75.50 cm/s LVOT Vmean:        53.400 cm/s LVOT VTI:          0.139 m LVOT/AV VTI ratio: 0.44 AI PHT:  679 msec  AORTA Ao Root diam: 2.90 cm Ao Asc diam:  2.90 cm MITRAL VALVE               TRICUSPID VALVE MV Area (PHT): 2.56 cm    TR Peak grad:   26.4 mmHg MV Area VTI:   2.01 cm    TR Vmax:        257.00 cm/s MV Peak grad:  6.5 mmHg MV Mean grad:  3.0 mmHg    SHUNTS MV Vmax:       1.27 m/s    Systemic VTI:  0.14 m MV Vmean:      69.4 cm/s   Systemic Diam: 2.00 cm MV Decel Time: 296 msec MV E velocity: 95.10 cm/s Mertie Moores MD Electronically signed by Mertie Moores MD Signature Date/Time: 03/31/2021/3:32:50 PM    Final       Subjective: Feels better, no sob or cp  Discharge Exam: Vitals:   04/02/21 1007 04/02/21 1251  BP: (!) 143/102 96/66  Pulse:  94  Resp:  19  Temp:  97.8 F (36.6 C)  SpO2:  92%   Vitals:   04/02/21 0530 04/02/21 0921 04/02/21 1007 04/02/21 1251  BP: 123/89 111/79 (!) 143/102 96/66  Pulse: 81   94  Resp: 18 20  19   Temp: (!) 97.5 F (36.4 C)   97.8 F (36.6 C)  TempSrc: Oral   Oral  SpO2: 96%   92%  Weight:      Height:        General: Pt is alert, awake, not in acute distress Cardiovascular: RRR, S1/S2 +, no rubs, no gallops Respiratory: CTA bilaterally, no wheezing, no rhonchi Abdominal: Soft, NT, ND, bowel sounds + Extremities: no edema    The results of significant diagnostics from this  hospitalization (including imaging, microbiology, ancillary and laboratory) are listed below for reference.     Microbiology: Recent Results (from the past 240 hour(s))  Resp Panel by RT-PCR (Flu A&B, Covid) Nasopharyngeal Swab     Status: None   Collection Time: 03/30/21  1:10 PM   Specimen: Nasopharyngeal Swab; Nasopharyngeal(NP) swabs in vial transport medium  Result Value Ref Range Status   SARS Coronavirus 2 by RT PCR NEGATIVE NEGATIVE Final    Comment: (NOTE) SARS-CoV-2 target nucleic acids are NOT DETECTED.  The SARS-CoV-2 RNA is generally detectable in upper respiratory specimens during the acute phase of infection. The lowest concentration of SARS-CoV-2 viral copies this assay can detect is 138 copies/mL. A negative result does not preclude SARS-Cov-2 infection and should not be used as the sole basis for treatment or other patient management decisions. A negative result may occur with  improper specimen collection/handling, submission of specimen other than nasopharyngeal swab, presence of viral mutation(s) within the areas targeted by this assay, and inadequate number of viral copies(<138 copies/mL). A negative result must be combined with clinical observations, patient history, and epidemiological information. The expected result is Negative.  Fact Sheet for Patients:  EntrepreneurPulse.com.au  Fact Sheet for Healthcare Providers:  IncredibleEmployment.be  This test is no t yet approved or cleared by the [place] FDA and  has been authorized for detection and/or diagnosis of SARS-CoV-2 by FDA under an Emergency Use Authorization (EUA). This EUA will remain  in effect (meaning this test can be used) for the duration of the COVID-19 declaration under Section 564(b)(1) of the Act, 21 U.S.C.section 360bbb-3(b)(1), unless the authorization is terminated  or revoked sooner.       Influenza  A by PCR NEGATIVE NEGATIVE Final    Influenza B by PCR NEGATIVE NEGATIVE Final    Comment: (NOTE) The Xpert Xpress SARS-CoV-2/FLU/RSV plus assay is intended as an aid in the diagnosis of influenza from Nasopharyngeal swab specimens and should not be used as a sole basis for treatment. Nasal washings and aspirates are unacceptable for Xpert Xpress SARS-CoV-2/FLU/RSV testing.  Fact Sheet for Patients: EntrepreneurPulse.com.au  Fact Sheet for Healthcare Providers: IncredibleEmployment.be  This test is not yet approved or cleared by the [place] FDA and has been authorized for detection and/or diagnosis of SARS-CoV-2 by FDA under an Emergency Use Authorization (EUA). This EUA will remain in effect (meaning this test can be used) for the duration of the COVID-19 declaration under Section 564(b)(1) of the Act, 21 U.S.C. section 360bbb-3(b)(1), unless the authorization is terminated or revoked.  Performed at [facility], [place] [address], [place], [place] [address]      Labs: BNP (last 3 results) Recent Labs    08/15/20 2209  BNP 703.5*   Basic Metabolic Panel: Recent Labs  Lab 03/30/21 1117 03/31/21 0545 04/01/21 0247 04/02/21 0249  NA 139 140 137 136  K 3.7 3.3* 3.9 4.8  CL 101 99 95* 98  CO2 29 31 34* 29  GLUCOSE 108* 101* 101* 92  BUN 11 9 15 19   CREATININE 1.13* 0.88 1.15* 1.13*  CALCIUM 8.5* 8.3* 8.8* 8.8*   Liver Function Tests: Recent Labs  Lab 03/30/21 1117 03/31/21 0545  AST 18 19  ALT 12 12  ALKPHOS 58 46  BILITOT 0.5 1.1  PROT 6.0* 6.0*  ALBUMIN 3.0* 3.0*   No results for input(s): LIPASE, AMYLASE in the last 168 hours. No results for input(s): AMMONIA in the last 168 hours. CBC: Recent Labs  Lab 03/30/21 1117 03/31/21 0545  WBC 7.7 8.5  NEUTROABS 3.6  --   HGB 14.4 15.2*  HCT 44.5 45.6  MCV 91.9 91.0  PLT 491* 479*   Cardiac Enzymes: No results for input(s): CKTOTAL, CKMB, CKMBINDEX, TROPONINI in the last 168  hours. BNP: Invalid input(s): POCBNP CBG: No results for input(s): GLUCAP in the last 168 hours. D-Dimer No results for input(s): DDIMER in the last 72 hours. Hgb A1c No results for input(s): HGBA1C in the last 72 hours. Lipid Profile Recent Labs    04/01/21 0247  CHOL 213*  HDL 48  LDLCALC 144*  TRIG 106  CHOLHDL 4.4   Thyroid function studies No results for input(s): TSH, T4TOTAL, T3FREE, THYROIDAB in the last 72 hours.  Invalid input(s): FREET3 Anemia work up No results for input(s): VITAMINB12, FOLATE, FERRITIN, TIBC, IRON, RETICCTPCT in the last 72 hours. Urinalysis    Component Value Date/Time   COLORURINE YELLOW 07/05/2017 2222   APPEARANCEUR CLEAR 07/05/2017 2222   LABSPEC 1.013 07/05/2017 2222   PHURINE 5.0 07/05/2017 2222   GLUCOSEU NEGATIVE 07/05/2017 2222   HGBUR NEGATIVE 07/05/2017 2222   BILIRUBINUR NEGATIVE 07/05/2017 2222   KETONESUR 5 (A) 07/05/2017 2222   PROTEINUR NEGATIVE 07/05/2017 2222   UROBILINOGEN 0.2 10/17/2008 0024   NITRITE NEGATIVE 07/05/2017 2222   LEUKOCYTESUR MODERATE (A) 07/05/2017 2222   Sepsis Labs Invalid input(s): PROCALCITONIN,  WBC,  LACTICIDVEN Microbiology Recent Results (from the past 240 hour(s))  Resp Panel by RT-PCR (Flu A&B, Covid) Nasopharyngeal Swab     Status: None   Collection Time: 03/30/21  1:10 PM   Specimen: Nasopharyngeal Swab; Nasopharyngeal(NP) swabs in vial transport medium  Result Value Ref Range Status  SARS Coronavirus 2 by RT PCR NEGATIVE NEGATIVE Final    Comment: (NOTE) SARS-CoV-2 target nucleic acids are NOT DETECTED.  The SARS-CoV-2 RNA is generally detectable in upper respiratory specimens during the acute phase of infection. The lowest concentration of SARS-CoV-2 viral copies this assay can detect is 138 copies/mL. A negative result does not preclude SARS-Cov-2 infection and should not be used as the sole basis for treatment or other patient management decisions. A negative result may occur  with  improper specimen collection/handling, submission of specimen other than nasopharyngeal swab, presence of viral mutation(s) within the areas targeted by this assay, and inadequate number of viral copies(<138 copies/mL). A negative result must be combined with clinical observations, patient history, and epidemiological information. The expected result is Negative.  Fact Sheet for Patients:  EntrepreneurPulse.com.au  Fact Sheet for Healthcare Providers:  IncredibleEmployment.be  This test is no t yet approved or cleared by the [place] FDA and  has been authorized for detection and/or diagnosis of SARS-CoV-2 by FDA under an Emergency Use Authorization (EUA). This EUA will remain  in effect (meaning this test can be used) for the duration of the COVID-19 declaration under Section 564(b)(1) of the Act, 21 U.S.C.section 360bbb-3(b)(1), unless the authorization is terminated  or revoked sooner.       Influenza A by PCR NEGATIVE NEGATIVE Final   Influenza B by PCR NEGATIVE NEGATIVE Final    Comment: (NOTE) The Xpert Xpress SARS-CoV-2/FLU/RSV plus assay is intended as an aid in the diagnosis of influenza from Nasopharyngeal swab specimens and should not be used as a sole basis for treatment. Nasal washings and aspirates are unacceptable for Xpert Xpress SARS-CoV-2/FLU/RSV testing.  Fact Sheet for Patients: EntrepreneurPulse.com.au  Fact Sheet for Healthcare Providers: IncredibleEmployment.be  This test is not yet approved or cleared by the [place] FDA and has been authorized for detection and/or diagnosis of SARS-CoV-2 by FDA under an Emergency Use Authorization (EUA). This EUA will remain in effect (meaning this test can be used) for the duration of the COVID-19 declaration under Section 564(b)(1) of the Act, 21 U.S.C. section 360bbb-3(b)(1), unless the authorization is terminated  or revoked.  Performed at [facility], [place] [address], [place], [place] [address]      Time coordinating discharge: Over 30 minutes  SIGNED:   Nolberto Hanlon, MD  [facility] 04/02/2021, 5:02 PM Pager   If 7PM-7AM, please contact night-coverage [email or web address] Password [email or web address]

## 2021-04-02 NOTE — Plan of Care (Signed)

## 2021-04-02 NOTE — Progress Notes (Addendum)
Physical Therapy Treatment/ Discharge ?Patient Details ?Name: [name] ?MRN: [id number] ?DOB: 11-12-1945 ?Today's Date: 04/02/2021 ? ? ?History of Present Illness 76 yo female presenting 2/28 with chest pain that improved with nitroglycerin. Found to have acute CHF exacerbation. PMH includes CAD s/p DES, CHF, afib on warfarin, thrombotic thrombocytopenic purpura, HTN, hypothyroidism, and morbid obesity. ? ?  ?PT Comments  ? ? Pt reports she has had limited mobility for 19 years and believes there is nothing that will help her (therapy, DME, HEP). Pt states she has furniture placed through her house that she holds onto and will fall into if she feels unsteady. PT educated for use of RW and /or rollator of which she has both but refuses to use. Pt unsteady with poor posture with all mobility and aware she is a high fall risk. Pt admittedly stating she is going to do things her way and will not benefit from therapy acutely or at home due to her lack of willingness to change despite education. Will sign off per pt request.  ?   ?Recommendations for follow up therapy are one component of a multi-disciplinary discharge planning process, led by the attending physician.  Recommendations may be updated based on patient status, additional functional criteria and insurance authorization. ? ?Follow Up Recommendations ? Home health PT (pt refuses) ?  ?  ?Assistance Recommended at Discharge    ?Patient can return home with the following A little help with bathing/dressing/bathroom;Assistance with cooking/housework;Direct supervision/assist for medications management;Assist for transportation;Help with stairs or ramp for entrance ?  ?Equipment Recommendations ? None recommended by PT  ?  ?Recommendations for Other Services   ? ? ?  ?Precautions / Restrictions Precautions ?Precautions: Fall  ?  ? ?Mobility ? Bed Mobility ?Overal bed mobility: Modified Independent ?  ?  ?  ?  ?  ?  ?General bed mobility comments: use of rail  with pt able to transition to sitting without physical assist ?  ? ?Transfers ?Overall transfer level: Modified independent ?  ?  ?  ?  ?  ?  ?  ?  ?General transfer comment: pt able to rise from bed without assist, decreased control to chair ?  ? ?Ambulation/Gait ?Ambulation/Gait assistance: Min guard ?Gait Distance (Feet): 65 Feet ?Assistive device: None (hall rail) ?Gait Pattern/deviations: Step-to pattern, Decreased stride length, Shuffle, Wide base of support, Trunk flexed ?  ?Gait velocity interpretation: <1.31 ft/sec, indicative of household ambulator ?  ?General Gait Details: small steps with significant lateral movements, flexed trunk and pt automatically reaching for environmental support of rail, sink, HHA etc. PT refused use of RW ? ? ?Stairs ?  ?  ?  ?  ?  ? ? ?Wheelchair Mobility ?  ? ?Modified Rankin (Stroke Patients Only) ?  ? ? ?  ?Balance Overall balance assessment: Needs assistance ?Sitting-balance support: No upper extremity supported ?Sitting balance-Leahy Scale: Fair ?  ?  ?Standing balance support: Single extremity supported ?Standing balance-Leahy Scale: Poor ?Standing balance comment: pt requires single UE support to walk more than a few feet ?  ?  ?  ?  ?  ?  ?  ?  ?  ?  ?  ?  ? ?  ?Cognition Arousal/Alertness: Awake/alert ?Behavior During Therapy: [facility] for tasks assessed/performed ?Overall Cognitive Status: Impaired/Different from baseline ?Area of Impairment: Safety/judgement ?  ?  ?  ?  ?  ?  ?  ?  ?  ?  ?  ?  ?Safety/Judgement: Decreased awareness of  deficits, Decreased awareness of safety ?  ?  ?General Comments: resistent to education or change. Pt aware of fall risk but states "this is my life" "i won't get better" ?  ?  ? ?  ?Exercises   ? ?  ?General Comments   ?  ?  ? ?Pertinent Vitals/Pain Pain Assessment ?Pain Assessment: No/denies pain  ? ? ?Home Living   ?  ?  ?  ?  ?  ?  ?  ?  ?  ?   ?  ?Prior Function    ?  ?  ?   ? ?PT Goals (current goals can now be found in the care plan  section) Progress towards PT goals: Not progressing toward goals - comment (pt declines ability to follow therapy instruction and request D/C of therapy services) ? ?  ?Frequency ? ? ?   ? ? ? ?  ?PT Plan Current plan remains appropriate  ? ? ?Co-evaluation   ?  ?  ?  ?  ? ?  ?AM-PAC PT "6 Clicks" Mobility   ?Outcome Measure ? Help needed turning from your back to your side while in a flat bed without using bedrails?: None ?Help needed moving from lying on your back to sitting on the side of a flat bed without using bedrails?: A Little ?Help needed moving to and from a bed to a chair (including a wheelchair)?: None ?Help needed standing up from a chair using your arms (e.g., wheelchair or bedside chair)?: A Little ?Help needed to walk in hospital room?: A Little ?Help needed climbing 3-5 steps with a railing? : A Little ?6 Click Score: 20 ? ?  ?End of Session   ?Activity Tolerance: Patient limited by fatigue ?Patient left: in chair;with call bell/phone within reach ?Nurse Communication: Mobility status ?PT Visit Diagnosis: Unsteadiness on feet (R26.81);Other abnormalities of gait and mobility (R26.89);Difficulty in walking, not elsewhere classified (R26.2) ?  ? ? ?Time: 0175-1025 ?PT Time Calculation (min) (ACUTE ONLY): 19 min ? ?Charges:  $Gait Training: 8-22 mins          ?          ? ?Shakenna Herrero P, PT ?Acute Rehabilitation Services ?Pager: [phone] ?Office: [phone] ? ? ? ?Kaegan Hettich B Mavrik Bynum ?04/02/2021, 12:09 PM ? ?

## 2021-04-12 ENCOUNTER — Encounter (HOSPITAL_COMMUNITY): Payer: Medicare Other

## 2021-04-20 NOTE — Progress Notes (Deleted)
?Cardiology Office Note:   ? ?Date:  04/20/2021  ? ?ID:  [name], DOB 1945/05/27, MRN [id number] ? ?PCP:  Elisabeth Cara, PA-C  ?[facility] Cardiologist:  [name], MD  ?[facility] Electrophysiologist:  Will Meredith Leeds, MD  ? ?Chief Complaint: Hospital follow up ? ?History of Present Illness:   ? ?[name] is a 76 y.o. female with a hx of CAD s/p DES to LAD in 04/2015, persistent atrial fibrillation, dilated CM, Chronic diastolic CHF, DM, HTN, HLD with statin intolerance and obesity seen for hospital follow up.  ? ?She had a negative stress test in 03/2005. TEE prior to DCCV in the same month showed normal EF and normal LA size. Patient maintained sinus rhythm on amiodarone until April 2016. At that time, patient was taken off her amiodarone. Was noted to be in atrial fibrillation in December 2016. Echocardiogram on 01/06/2015 showed EF 25-30%. Patient was admitted to the hospital in March 2017 for paroxysmal>persistent atrial fibrillation, chronic systolic heart failure. Underwent left heart catheterization on 04/17/2015 that showed a 1st diagonal lesion that was 50% stenosed, an a mid LAD lesion that was 80% stenosed. A DES was placed to the LAD. Following her cath, patient continued to have chronic atrial fibrillation. Echocardiogram on 08/05/2015 showed that her EF had improved to 50-55%. Patient was seen by EP and undwerent TEE and tikosyn loading on 09/30/2015. However, patient decided to discontinue the tikosyn due to financial concerns on 10/01/15. Patient underwent cardioversion on 10/03/2015 and was started on sotalol. Follow up echocardiogram on 08/2016 showed EF 55-60%, grade II diastolic dysfunction. Patient has some SOB and chest pain so she underwent a nuclear stress test on 08/23/2018 that was normal and low risk study. Echocardiogram on 08/23/2018 showed EF 55-60, grade II diastolic dysfunction.  ? ?Admitted 03/2021 for chest pain and CHF.  EKG showed no acute ischemic  changes. High-sensitivity troponin negative x2. Echo showed normal LV function and wall motion. RV mildly enlarged with moderately reduced systolic function but PASP normal. Medical management recommended with Toprol XL and Imdur   Started on IV Lasix with good urinary response. ? ?Past Medical History:  ?Diagnosis Date  ? CAD (coronary artery disease)   ? a. LHC 3/17: mLAD 80, D1 50, pRCA 30, EF 25-35%; PCI: 3.5 x 16 mm Synergy DES to mLAD // Myoview 08/2018: EF 54, normal perfusion; Low Risk  ? Chronic diastolic CHF 06/02/7739  ? Echocardiogram 08/2018: EF 55-60, mild septal basal hypertrophy, Gr 2 DD, elevated LVEDP, mild LAE, mild AI  ? Dilated cardiomyopathy >> EF returned to normal in NSR   ? Probable Tachycardia Mediated // a. Echo 12/16: Mild concentric LVH, EF 25-30%, anteroseptal akinesis, inferoseptal akinesis, anterior akinesis, trivial AI, mild MR, moderate LAE, trivial TR, trivial PI, PASP 33 mmHg  //  b. Echo 2/17: Mild LVH, EF 25-30%, diffuse HK, mild LAE  //  c. Echo 7/17: mild LVH, EF 50-55%, no RWMA, trivial AI, mild to mod LAE, mild RAE  ? Febrile seizure ([place]) 2004  ? "during TTP thing; cause my temp was 106"  ? High cholesterol   ? History of blood transfusion 2004  ? "several; related to TTP"  ? Hypertension   ? Hypothyroidism   ? Morbid obesity ([place]) 01/07/2013  ? Osteoarthritis of back   ? Paroxysmal atrial fibrillation ([place]) 08/01/2011  ? a. started on Sotalol during 10/2015 admission (Tikosyn too expensive)  ? Pneumonia ?1999  ? Scoliosis   ? Thyroid  nodule   ? "grew back after 2 partial thyroid surgeries" (09/29/2015)  ? TTP (thrombotic thrombocytopenic purpura) ([place]) 05/03/2011  ? ? ?Past Surgical History:  ?Procedure Laterality Date  ? ABDOMINAL EXPLORATION SURGERY    ? "opened me up 2-3 times for blocked intestines"  ? APPENDECTOMY    ? BACK SURGERY    ? BASAL CELL CARCINOMA EXCISION Right   ? "neck"  ? CARDIAC CATHETERIZATION N/A 04/17/2015  ? Procedure: Left Heart Cath and Coronary  Angiography;  Surgeon: Jettie Booze, MD;  Location: [facility];  Service: Cardiovascular;  Laterality: N/A;  ? CARDIAC CATHETERIZATION  04/17/2015  ? Procedure: Coronary Stent Intervention;  Surgeon: Jettie Booze, MD;  Location: [facility];  Service: Cardiovascular;;  ? CARDIOVERSION  06/2002; 03/2005; 05/2005  ? Archie Endo 06/16/2010  ? CARDIOVERSION N/A 10/03/2015  ? Procedure: CARDIOVERSION;  Surgeon: Deboraha Sprang, MD;  Location: [facility];  Service: Cardiovascular;  Laterality: N/A;  ? CATARACT EXTRACTION W/ INTRAOCULAR LENS  IMPLANT, BILATERAL Bilateral   ? CHOLECYSTECTOMY OPEN    ? CORONARY ANGIOPLASTY    ? DILATION AND CURETTAGE OF UTERUS    ? S/P "miscarriage"  ? FOOT SURGERY Right   ? "for nerve damage"  ? SALIVARY STONE REMOVAL Left   ? "had tumor wrapped around it"  ? SPLENECTOMY, TOTAL    ? Archie Endo 06/16/2010  ? TEE WITHOUT CARDIOVERSION N/A 09/30/2015  ? Procedure: TRANSESOPHAGEAL ECHOCARDIOGRAM (TEE);  Surgeon: Skeet Latch, MD;  Location: [facility];  Service: Cardiovascular;  Laterality: N/A;  ? THORACIC DISCECTOMY  05/2002  ? THYROIDECTOMY, PARTIAL  X 2  ? "grew back"  ? TONSILLECTOMY    ? TUBAL LIGATION    ? TUMOR EXCISION    ? "benign tumor in my neck"  ? VAGINAL HYSTERECTOMY    ? ? ?Current Medications: ?No outpatient medications have been marked as taking for the 04/22/21 encounter (Appointment) with Leanor Kail, [place].  ?  ? ?Allergies:   Ceftriaxone, Penicillins, Levofloxacin, Levofloxacin, Oxycodone, Tape, Alendronate, Aspirin, Oxycodone-acetaminophen, and Penicillin g  ? ?Social History  ? ?Socioeconomic History  ? Marital status: Married  ?  Spouse name: Not on file  ? Number of children: Not on file  ? Years of education: Not on file  ? Highest education level: Not on file  ?Occupational History  ? Occupation: retired  ?Tobacco Use  ? Smoking status: Never  ? Smokeless tobacco: Never  ?Vaping Use  ? Vaping Use: Never used  ?Substance and Sexual Activity  ? Alcohol  use: No  ? Drug use: No  ? Sexual activity: Never  ?Other Topics Concern  ? Not on file  ?Social History Narrative  ? Not on file  ? ?Social Determinants of Health  ? ?Financial Resource Strain: Not on file  ?Food Insecurity: Not on file  ?Transportation Needs: Not on file  ?Physical Activity: Not on file  ?Stress: Not on file  ?Social Connections: Not on file  ?  ? ?Family History: ?The patient's family history includes Cancer in her brother and sister; Heart attack in her mother.  *** ? ?ROS:   ?Please see the history of present illness.    ?All other systems reviewed and are negative. *** ? ?EKGs/Labs/Other Studies Reviewed:   ? ?The following studies were reviewed today: ?*** ? ?EKG:  EKG is *** ordered today.  The ekg ordered today demonstrates *** ? ?Recent Labs: ?08/15/2020: B Natriuretic Peptide 168.7 ?08/18/2020: Magnesium 2.0 ?03/31/2021: ALT 12; Hemoglobin  15.2; Platelets 479 ?04/02/2021: BUN 19; Creatinine, Ser 1.13; Potassium 4.8; Sodium 136  ?Recent Lipid Panel ?   ?Component Value Date/Time  ? CHOL 213 (H) 04/01/2021 0247  ? TRIG 106 04/01/2021 0247  ? HDL 48 04/01/2021 0247  ? CHOLHDL 4.4 04/01/2021 0247  ? VLDL 21 04/01/2021 0247  ? [place] 144 (H) 04/01/2021 0247  ? ? ? ?Risk Assessment/Calculations:   ?{Does this patient have ATRIAL FIBRILLATION?:[id number]} ? ? ?Physical Exam:   ? ?VS:  There were no vitals taken for this visit.   ? ?Wt Readings from Last 3 Encounters:  ?04/02/21 254 lb 1.6 oz (115.3 kg)  ?08/20/20 267 lb 4.8 oz (121.2 kg)  ?05/08/20 282 lb (127.9 kg)  ?  ? ?GEN: *** Well nourished, well developed in no acute distress ?HEENT: Normal ?NECK: No JVD; No carotid bruits ?LYMPHATICS: No lymphadenopathy ?CARDIAC: ***RRR, no murmurs, rubs, gallops ?RESPIRATORY:  Clear to auscultation without rales, wheezing or rhonchi  ?ABDOMEN: Soft, non-tender, non-distended ?MUSCULOSKELETAL:  No edema; No deformity  ?SKIN: Warm and dry ?NEUROLOGIC:  Alert and oriented x 3 ?PSYCHIATRIC:  Normal affect   ? ?ASSESSMENT AND PLAN:  ? ? ?CAD s/p DES to LAD in 2019 ? ?2. Chronic diastolic CHF ? ?3. Persistent atrial fibrillation  ?- Failed Tikosyn due to cost so has been on Sotalol. ?- Dr. Marlou Porch felt that restoration of si

## 2021-04-22 ENCOUNTER — Ambulatory Visit: Payer: Medicare Other | Admitting: Physician Assistant

## 2021-04-23 ENCOUNTER — Ambulatory Visit (INDEPENDENT_AMBULATORY_CARE_PROVIDER_SITE_OTHER): Payer: Medicare Other

## 2021-04-23 ENCOUNTER — Other Ambulatory Visit: Payer: Self-pay

## 2021-04-23 DIAGNOSIS — Z5181 Encounter for therapeutic drug level monitoring: Secondary | ICD-10-CM

## 2021-04-23 DIAGNOSIS — I4819 Other persistent atrial fibrillation: Secondary | ICD-10-CM

## 2021-04-23 LAB — POCT INR: INR: 3.6 — AB (ref 2.0–3.0)

## 2021-04-23 NOTE — Patient Instructions (Signed)
Description   ?Skip today's dosage of Warfarin and eat greens, then resume same dosage Coumadin '5mg'$  (1 tablet) daily except 7.5 mg (1.5 tablets) on Tuesdays and Fridays. Recheck INR 2 weeks.  ?Call us with any new medications or concerns [phone]. ?  ?   ?

## 2021-04-26 ENCOUNTER — Other Ambulatory Visit: Payer: Self-pay | Admitting: Cardiology

## 2021-05-07 ENCOUNTER — Ambulatory Visit (INDEPENDENT_AMBULATORY_CARE_PROVIDER_SITE_OTHER): Payer: Medicare Other

## 2021-05-07 DIAGNOSIS — I4819 Other persistent atrial fibrillation: Secondary | ICD-10-CM

## 2021-05-07 DIAGNOSIS — Z5181 Encounter for therapeutic drug level monitoring: Secondary | ICD-10-CM | POA: Diagnosis not present

## 2021-05-07 LAB — POCT INR: INR: 2.3 (ref 2.0–3.0)

## 2021-05-07 NOTE — Patient Instructions (Signed)
-   continue same dosage Coumadin '5mg'$  (1 tablet) daily except 7.5 mg (1.5 tablets) on Tuesdays and Fridays.  ?- Recheck INR 6 weeks.  ?Call us with any new medications or concerns [phone]. ?

## 2021-06-18 ENCOUNTER — Ambulatory Visit (INDEPENDENT_AMBULATORY_CARE_PROVIDER_SITE_OTHER): Payer: Medicare Other

## 2021-06-18 DIAGNOSIS — Z5181 Encounter for therapeutic drug level monitoring: Secondary | ICD-10-CM | POA: Diagnosis not present

## 2021-06-18 DIAGNOSIS — I4819 Other persistent atrial fibrillation: Secondary | ICD-10-CM | POA: Diagnosis not present

## 2021-06-18 LAB — POCT INR: INR: 3.9 — AB (ref 2.0–3.0)

## 2021-06-18 NOTE — Patient Instructions (Signed)
Description   -Hold today's dose and then continue same dosage Coumadin '5mg'$  (1 tablet) daily except 7.5 mg (1.5 tablets) on Tuesdays and Fridays.  - Stay consistent with greens each week  - Recheck INR 5 weeks.  Call us with any new medications or concerns [phone].

## 2021-06-29 ENCOUNTER — Telehealth: Payer: Self-pay | Admitting: Cardiology

## 2021-06-29 NOTE — Telephone Encounter (Signed)
Called and spoke to pt who stated she can not remember if she took her medication.   Informed pt that she should not take any extra warfarin because her INR was high last time and we would not want her to double up on her medication.   Informed pt that if she missed her warfarin then her INR will drop. Requested that she come in this week to have INR checked. Pt stated that she would have to talk to her family to see when she could come.   Pt stated that her nose has been bleeding for 3 days. Pt stated that it is not continuously bleeding but when ever she wipes her nose there is some blood or it is blood tinged.    Gave pt the direct number to the coumadin clinic to call back to reschedule her appointment.

## 2021-06-29 NOTE — Telephone Encounter (Signed)
Pt c/o medication issue:  1. Name of Medication:   warfarin (COUMADIN) 5 MG tablet    2. How are you currently taking this medication (dosage and times per day)? Take 1 to 1.5 tablets by mouth once daily as directed by Anticoagulation Clinic.  3. Are you having a reaction (difficulty breathing--STAT)? No  4. What is your medication issue? Pt states that she can't remember if she took medication or not due to her having company. Pt states that now she's having a light nose bleed and wants to know if she should at least take 1/2 a tablet of medication. Please advise

## 2021-06-30 ENCOUNTER — Ambulatory Visit (INDEPENDENT_AMBULATORY_CARE_PROVIDER_SITE_OTHER): Payer: Medicare Other

## 2021-06-30 DIAGNOSIS — Z5181 Encounter for therapeutic drug level monitoring: Secondary | ICD-10-CM | POA: Diagnosis not present

## 2021-06-30 DIAGNOSIS — I4819 Other persistent atrial fibrillation: Secondary | ICD-10-CM

## 2021-06-30 LAB — POCT INR: INR: 2.1 (ref 2.0–3.0)

## 2021-06-30 NOTE — Patient Instructions (Signed)
Description   Continue same dosage Coumadin '5mg'$  (1 tablet) daily except 7.5 mg (1.5 tablets) on Tuesdays and Fridays.  - Stay consistent with greens each week  - Recheck INR 6 weeks.  Call us with any new medications or concerns [phone].

## 2021-06-30 NOTE — Telephone Encounter (Signed)
Pt called back and stated that she could only bee seen today from 2-3 because that is when her grandson has off.   Scheduled the patient today at 2pm.

## 2021-07-28 ENCOUNTER — Other Ambulatory Visit: Payer: Self-pay | Admitting: Cardiology

## 2021-07-29 ENCOUNTER — Other Ambulatory Visit: Payer: Self-pay | Admitting: Cardiology

## 2021-08-06 ENCOUNTER — Ambulatory Visit (INDEPENDENT_AMBULATORY_CARE_PROVIDER_SITE_OTHER): Payer: Medicare Other | Admitting: *Deleted

## 2021-08-06 DIAGNOSIS — Z5181 Encounter for therapeutic drug level monitoring: Secondary | ICD-10-CM | POA: Diagnosis not present

## 2021-08-06 DIAGNOSIS — I4819 Other persistent atrial fibrillation: Secondary | ICD-10-CM

## 2021-08-06 LAB — POCT INR: INR: 3.7 — AB (ref 2.0–3.0)

## 2021-08-06 NOTE — Patient Instructions (Signed)
Description   Hold warfarin today and then continue same dosage of Coumadin '5mg'$  (1 tablet) daily except 7.5 mg (1.5 tablets) on Tuesdays and Fridays.  - Stay consistent with greens each week  - Recheck INR 3 weeks.  Call us with any new medications or concerns [phone].

## 2021-08-18 ENCOUNTER — Other Ambulatory Visit: Payer: Self-pay | Admitting: Cardiology

## 2021-08-27 ENCOUNTER — Ambulatory Visit (INDEPENDENT_AMBULATORY_CARE_PROVIDER_SITE_OTHER): Payer: Medicare Other | Admitting: *Deleted

## 2021-08-27 ENCOUNTER — Other Ambulatory Visit: Payer: Self-pay | Admitting: Cardiology

## 2021-08-27 DIAGNOSIS — Z5181 Encounter for therapeutic drug level monitoring: Secondary | ICD-10-CM | POA: Diagnosis not present

## 2021-08-27 DIAGNOSIS — I4819 Other persistent atrial fibrillation: Secondary | ICD-10-CM

## 2021-08-27 LAB — POCT INR: INR: 2.8 (ref 2.0–3.0)

## 2021-08-27 NOTE — Patient Instructions (Addendum)
Description   Continue same dosage of Coumadin '5mg'$  (1 tablet) daily except 7.5 mg (1.5 tablets) on Tuesdays and Fridays. Stay consistent with greens each week. Recheck INR 4 weeks.  Call us with any new medications or concerns [phone].

## 2021-09-10 ENCOUNTER — Other Ambulatory Visit: Payer: Self-pay | Admitting: Cardiology

## 2021-09-13 ENCOUNTER — Other Ambulatory Visit: Payer: Self-pay

## 2021-09-14 ENCOUNTER — Ambulatory Visit: Payer: Medicare Other | Admitting: Cardiology

## 2021-09-17 ENCOUNTER — Ambulatory Visit (INDEPENDENT_AMBULATORY_CARE_PROVIDER_SITE_OTHER): Payer: Medicare Other | Admitting: Cardiology

## 2021-09-17 ENCOUNTER — Encounter: Payer: Self-pay | Admitting: Cardiology

## 2021-09-17 VITALS — BP 120/60 | HR 69 | Ht 64.0 in | Wt 260.0 lb

## 2021-09-17 DIAGNOSIS — I2510 Atherosclerotic heart disease of native coronary artery without angina pectoris: Secondary | ICD-10-CM

## 2021-09-17 DIAGNOSIS — I4819 Other persistent atrial fibrillation: Secondary | ICD-10-CM

## 2021-09-17 DIAGNOSIS — I5032 Chronic diastolic (congestive) heart failure: Secondary | ICD-10-CM | POA: Diagnosis not present

## 2021-09-17 NOTE — Patient Instructions (Signed)

## 2021-09-17 NOTE — Progress Notes (Signed)
Cardiology Office Note:    Date:  09/17/2021   ID:  [name], DOB 10-14-1945, MRN [id number]  PCP:  Fulbright, Virginia E, [place]  Cardiologist:  [name], MD  Advanced Practice Provider:  No care team member to display Electrophysiologist:  Will Meredith Leeds, MD       Referring MD: Belva Bertin, [place], *     History of Present Illness:    [name] is a 76 y.o. female here for the follow-up of atrial fibrillation and congestive heart failure.   Previously here for the follow-up of acute diastolic heart failure.  In January 2022 we gave her additional Lasix.  Lost about 14 pounds.  Felt better.  Yolanda Bonine is very vigilant to her, he is a Administrator.  At her last appointment, she was doing well. Overall no major changes.   Today:  She is accompanied by her son-in-law. She says she is feeling good. She states her blood pressure is wonderful and so is she. Discussed alternatives to coumadin at length.   She has had episodes of severe bilateral foot edema, which increased her difficulty of getting to the restroom.   She reports that 80 mg of furosemide makes her cramp, so she usually takes 60 mg.   She denies any palpitations, chest pain, or shortness of breath. No lightheadedness, headaches, syncope, orthopnea, or PND.    Past Medical History:  Diagnosis Date   CAD (coronary artery disease)    a. LHC 3/17: mLAD 80, D1 50, pRCA 30, EF 25-35%; PCI: 3.5 x 16 mm Synergy DES to mLAD // Myoview 08/2018: EF 54, normal perfusion; Low Risk   Chronic diastolic CHF 4/76/5465   Echocardiogram 08/2018: EF 55-60, mild septal basal hypertrophy, Gr 2 DD, elevated LVEDP, mild LAE, mild AI   Dilated cardiomyopathy >> EF returned to normal in NSR    Probable Tachycardia Mediated // a. Echo 12/16: Mild concentric LVH, EF 25-30%, anteroseptal akinesis, inferoseptal akinesis, anterior akinesis, trivial AI, mild MR, moderate LAE, trivial  TR, trivial PI, PASP 33 mmHg  //  b. Echo 2/17: Mild LVH, EF 25-30%, diffuse HK, mild LAE  //  c. Echo 7/17: mild LVH, EF 50-55%, no RWMA, trivial AI, mild to mod LAE, mild RAE   Febrile seizure ([place]) 2004   "during TTP thing; cause my temp was 106"   High cholesterol    History of blood transfusion 2004   "several; related to TTP"   Hypertension    Hypothyroidism    Morbid obesity ([place]) 01/07/2013   Osteoarthritis of back    Paroxysmal atrial fibrillation ([place]) 08/01/2011   a. 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    Past Surgical History:  Procedure Laterality Date   ABDOMINAL EXPLORATION SURGERY     "opened me up 2-3 times for blocked intestines"   APPENDECTOMY     BACK SURGERY     BASAL CELL CARCINOMA EXCISION Right    "neck"   CARDIAC CATHETERIZATION N/A 04/17/2015   Procedure: Left Heart Cath and Coronary Angiography;  Surgeon: Jettie Booze, MD;  Location: [facility];  Service: Cardiovascular;  Laterality: N/A;   CARDIAC CATHETERIZATION  04/17/2015   Procedure: Coronary Stent Intervention;  Surgeon: Jettie Booze, MD;  Location: [place] [facility];  Service: Cardiovascular;;   CARDIOVERSION  06/2002; 03/2005; 05/2005   Archie Endo 06/16/2010   CARDIOVERSION N/A 10/03/2015   Procedure: CARDIOVERSION;  Surgeon: Deboraha Sprang, MD;  Location: [facility];  Service: Cardiovascular;  Laterality: N/A;   CATARACT EXTRACTION W/ INTRAOCULAR LENS  IMPLANT, BILATERAL Bilateral    CHOLECYSTECTOMY OPEN     CORONARY ANGIOPLASTY     DILATION AND CURETTAGE OF UTERUS     S/P "miscarriage"   FOOT SURGERY Right    "for nerve damage"   SALIVARY STONE REMOVAL Left    "had tumor wrapped around it"   SPLENECTOMY, TOTAL     Archie Endo 06/16/2010   TEE WITHOUT CARDIOVERSION N/A 09/30/2015    Procedure: TRANSESOPHAGEAL ECHOCARDIOGRAM (TEE);  Surgeon: Skeet Latch, MD;  Location: [facility];  Service: Cardiovascular;  Laterality: N/A;   THORACIC DISCECTOMY  05/2002   THYROIDECTOMY, PARTIAL  X 2   "grew back"   TONSILLECTOMY     TUBAL LIGATION     TUMOR EXCISION     "benign tumor in my neck"   VAGINAL HYSTERECTOMY      Current Medications: Current Meds  Medication Sig   albuterol (ACCUNEB) 0.63 MG/3ML nebulizer solution Take 3 mLs by nebulization every 4 (four) hours as needed.   Cholecalciferol (VITAMIN D3) 2000 units capsule Take 2,000 Units by mouth every morning.   furosemide (LASIX) 40 MG tablet Take 1 tablet (40 mg total) by mouth daily. Please call [phone] to schedule an overdue appointment for future refills. Thank you. 2nd attempt.   isosorbide mononitrate (IMDUR) 30 MG 24 hr tablet TAKE 1 TABLET BY MOUTH EVERY DAY   levothyroxine (SYNTHROID, LEVOTHROID) 50 MCG tablet Take 50 mcg by mouth daily before breakfast.   meclizine (ANTIVERT) 25 MG tablet Take 12.5-25 mg by mouth 3 (three) times daily as needed.   metoprolol succinate (TOPROL-XL) 25 MG 24 hr tablet TAKE 1 TABLET BY MOUTH EVERY DAY WITH OR IMMEDIATELY FOLLOWING A MEAL   nitroGLYCERIN (NITROSTAT) 0.4 MG SL tablet PLACE 1 TABLET (0.4 MG TOTAL) UNDER THE TONGUE EVERY 5 (FIVE) MINUTES AS NEEDED FOR CHEST PAIN.   potassium chloride (KLOR-CON) 10 MEQ tablet Take 10 mEq by mouth daily.   sotalol (BETAPACE) 120 MG tablet TAKE 1 TABLET BY MOUTH 2 TIMES DAILY.   warfarin (COUMADIN) 5 MG tablet Take 1 to 1.5 tablets by mouth once daily as directed by Anticoagulation Clinic.     Allergies:   Ceftriaxone, Penicillins, Levofloxacin, Levofloxacin, Oxycodone, Tape, Alendronate, Aspirin, Oxycodone-acetaminophen, and Penicillin g   Social History   Socioeconomic History   Marital status: Married    Spouse name: Not on file   Number of children: Not on file   Years of education: Not on file   Highest education  level: Not on file  Occupational History   Occupation: retired  Tobacco Use   Smoking status: Never   Smokeless tobacco: Never  Vaping Use   Vaping Use: Never used  Substance and Sexual Activity   Alcohol use: No   Drug use: No   Sexual activity: Never  Other Topics Concern   Not on file  Social History Narrative   Not on file   Social Determinants of Health   Financial Resource Strain: Not on file  Food Insecurity: Not on file  Transportation Needs: Not on file  Physical Activity: Not on file  Stress: Not on file  Social Connections: Not on file     Family History: The patient's  family history includes Cancer in her brother and sister; Heart attack in her mother.  ROS:   Please see the history of present illness.    (+) Bilateral foot edema   All other systems reviewed and are negative.  EKGs/Labs/Other Studies Reviewed:    Echo 03/31/2021:  1. Left ventricular ejection fraction, by estimation, is 55 to 60%. The  left ventricle has normal function. The left ventricle has no regional  wall motion abnormalities. Left ventricular diastolic function could not  be evaluated.   2. Right ventricular systolic function is moderately reduced. The right  ventricular size is mildly enlarged. There is normal pulmonary artery  systolic pressure.   3. Right atrial size was moderately dilated.   4. The mitral valve is grossly normal. No evidence of mitral valve  regurgitation.   5. The aortic valve is grossly normal. Aortic valve regurgitation is not  visualized. No aortic stenosis is present.    Stress test 08/23/2018: Nuclear stress EF: 54%. No T wave inversion was noted during stress. There was no ST segment deviation noted during stress. This is a low risk study.   Normal perfusion. LVEF 54% with normal wall motion. This is a low risk study.   Left Heart Cath and Coronary Angiography 04/17/2015: 1st Diag lesion, 50% stenosed. Mid LAD lesion, 80% stenosed. Post  intervention with a 3.5 x 16 Synergy drug eluting stent, there is a 0% residual stenosis. There is moderate left ventricular systolic dysfunction.   She'll need clopidogrel along with her warfarin for at least several months. Ideally, she could have at least 6 months of antiplatelet therapy. I did not start aspirin because she has refused it earlier today. We'll restart Coumadin. Continue Angiomax for another hour or so. She'll be watched overnight with plan for discharge tomorrow. She should follow-up with Dr. Marlou Porch and continue with aggressive medical therapy for cardiomyopathy.   EKG: EKG is personally reviewed.  09/17/21: EKG was not ordered.    Recent Labs: 03/31/2021: ALT 12; Hemoglobin 15.2; Platelets 479 04/02/2021: BUN 19; Creatinine, Ser 1.13; Potassium 4.8; Sodium 136  Recent Lipid Panel    Component Value Date/Time   CHOL 213 (H) 04/01/2021 0247   TRIG 106 04/01/2021 0247   HDL 48 04/01/2021 0247   CHOLHDL 4.4 04/01/2021 0247   VLDL 21 04/01/2021 0247   LDLCALC 144 (H) 04/01/2021 0247     Risk Assessment/Calculations:      Physical Exam:    VS:  BP 120/60 (BP Location: Left Arm, Patient Position: Sitting, Cuff Size: Normal)   Pulse 69   Ht '5\' 4"'$  (1.626 m)   Wt 260 lb (117.9 kg)   SpO2 92%   BMI 44.63 kg/m     Wt Readings from Last 3 Encounters:  09/17/21 260 lb (117.9 kg)  04/02/21 254 lb 1.6 oz (115.3 kg)  08/20/20 267 lb 4.8 oz (121.2 kg)     GEN:  Well nourished, well developed in no acute distress, in wheel chair HEENT: Normal NECK: No JVD; No carotid bruits LYMPHATICS: No lymphadenopathy CARDIAC: RRR, no murmurs, rubs, gallops RESPIRATORY:  Clear to auscultation without rales, wheezing or rhonchi  ABDOMEN: Soft, non-tender, non-distended MUSCULOSKELETAL:  No edema; No deformity  SKIN: Warm and dry NEUROLOGIC:  Alert and oriented x 3 PSYCHIATRIC:  Normal affect   ASSESSMENT:    1. Coronary artery disease involving native coronary artery of native  heart without angina pectoris   2. Persistent atrial fibrillation ([place])   3. Chronic diastolic CHF  PLAN:    In order of problems listed above:   Acute diastolic heart failure/shortness of breath - Much improved since last visit.  Weight was 292, now 254 - Continue with Lasix as directed.  She knows to take an extra pill if necessary for increased swelling.  Doing well.   Morbid obesity -  Watch fast food.  Continue to encourage weight loss   Paroxysmal atrial fibrillation - Maintaining sinus rhythm on sotalol.  Monitoring QTC.    Q TC at last check was 497 no changes.  No adverse arrhythmias.   Bradycardia - Metoprolol previously decreased to 25 mg a day back in August 2021.  Stable.  Overall doing well.   Chronic anticoagulation - On Coumadin no bleeding.  Could not afford the Eliquis/Xarelto.  This is being [facility].   Coronary artery disease - Prior drug-eluting stent to LAD in 2017.  Nuclear stress test July 2020 was low risk.  On isosorbide for anginal symptoms.   Prior history of TTP - Previously seen Dr. Beryle Beams.  She was told not to take COVID-vaccine because of this.     Follow up: 1 year  Medication Adjustments/Labs and Tests Ordered: Current medicines are reviewed at length with the patient today.  Concerns regarding medicines are outlined above.  No orders of the defined types were placed in this encounter.  No orders of the defined types were placed in this encounter.   Patient Instructions  Medication Instructions:  The current medical regimen is effective;  continue present plan and medications.  *If you need a refill on your cardiac medications before your next appointment, please call your pharmacy*  Follow-Up: At [facility], you and your health needs are our priority.  As part of our continuing mission to provide you with exceptional heart care, we have created designated Provider Care Teams.  These Care Teams include your  primary Cardiologist (physician) and Advanced Practice Providers (APPs -  Physician Assistants and Nurse Practitioners) who all work together to provide you with the care you need, when you need it.  We recommend signing up for the patient portal called "MyChart".  Sign up information is provided on this After Visit Summary.  MyChart is used to connect with patients for Virtual Visits (Telemedicine).  Patients are able to view lab/test results, encounter notes, upcoming appointments, etc.  Non-urgent messages can be sent to your provider as well.   To learn more about what you can do with MyChart, go to [email or web address].    Your next appointment:   1 year(s)  The format for your next appointment:   In Person  Provider:   [name], MD {   Important Information About Sugar          I,Breanna Adamick,acting as a scribe for [name], MD.,have documented all relevant documentation on the behalf of [name], MD,as directed by  [name], MD while in the presence of [name], MD.   I, [name], MD, have reviewed all documentation for this visit. The documentation on 09/17/21 for the exam, diagnosis, procedures, and orders are all accurate and complete.   Signed, [name], MD  09/17/2021 11:22 AM    [facility]

## 2021-09-24 ENCOUNTER — Ambulatory Visit (INDEPENDENT_AMBULATORY_CARE_PROVIDER_SITE_OTHER): Payer: Medicare Other

## 2021-09-24 DIAGNOSIS — Z5181 Encounter for therapeutic drug level monitoring: Secondary | ICD-10-CM | POA: Diagnosis not present

## 2021-09-24 DIAGNOSIS — I4819 Other persistent atrial fibrillation: Secondary | ICD-10-CM | POA: Diagnosis not present

## 2021-09-24 LAB — POCT INR: INR: 3.1 — AB (ref 2.0–3.0)

## 2021-09-24 NOTE — Patient Instructions (Signed)
Continue same dosage of Coumadin '5mg'$  (1 tablet) daily except 7.5 mg (1.5 tablets) on Tuesdays and Fridays. Stay consistent with greens each week. Recheck INR 6 weeks.  Call us with any new medications or concerns [phone].

## 2021-10-02 ENCOUNTER — Other Ambulatory Visit: Payer: Self-pay | Admitting: Cardiology

## 2021-10-12 ENCOUNTER — Other Ambulatory Visit: Payer: Self-pay | Admitting: Cardiology

## 2021-11-05 ENCOUNTER — Ambulatory Visit: Payer: Medicare Other | Attending: Cardiovascular Disease

## 2021-11-05 DIAGNOSIS — I4819 Other persistent atrial fibrillation: Secondary | ICD-10-CM | POA: Diagnosis not present

## 2021-11-05 DIAGNOSIS — Z5181 Encounter for therapeutic drug level monitoring: Secondary | ICD-10-CM

## 2021-11-05 LAB — POCT INR: INR: 3.8 — AB (ref 2.0–3.0)

## 2021-11-05 NOTE — Patient Instructions (Signed)
HOLD TODAY ONLY AND THEN DECREASE TO  '5mg'$  (1 tablet) daily except 7.5 mg (1.5 tablets) on Tuesdays. Stay consistent with greens each week. Recheck INR 3 weeks.  Call us with any new medications or concerns [phone].

## 2021-11-26 ENCOUNTER — Ambulatory Visit: Payer: Medicare Other

## 2021-11-30 ENCOUNTER — Ambulatory Visit: Payer: Medicare Other | Attending: Cardiology | Admitting: *Deleted

## 2021-11-30 DIAGNOSIS — I4819 Other persistent atrial fibrillation: Secondary | ICD-10-CM

## 2021-11-30 DIAGNOSIS — Z5181 Encounter for therapeutic drug level monitoring: Secondary | ICD-10-CM

## 2021-11-30 LAB — POCT INR: INR: 3.3 — AB (ref 2.0–3.0)

## 2021-11-30 NOTE — Patient Instructions (Addendum)
  Description   Do not take any warfarin today then continue taking '5mg'$  (1 tablet) daily except 7.5 mg (1.5 tablets) on Tuesdays. Please resume normal leafy veggies 2 times a week & stay consistent with greens each week. Recheck INR 2 weeks.  Call us with any new medications or concerns [phone] or [phone].

## 2021-12-03 ENCOUNTER — Other Ambulatory Visit: Payer: Self-pay | Admitting: Cardiology

## 2021-12-03 DIAGNOSIS — I4819 Other persistent atrial fibrillation: Secondary | ICD-10-CM

## 2021-12-03 NOTE — Telephone Encounter (Signed)
Prescription refill request received for warfarin Lov: 09/17/21 Marlou Porch)  Next INR check: 12/17/21 Warfarin tablet strength: '5mg'$   Appropriate dose and refill sent to requested pharmacy.

## 2021-12-17 ENCOUNTER — Ambulatory Visit: Payer: Medicare Other | Attending: Cardiology

## 2021-12-17 DIAGNOSIS — I4819 Other persistent atrial fibrillation: Secondary | ICD-10-CM | POA: Diagnosis not present

## 2021-12-17 DIAGNOSIS — Z5181 Encounter for therapeutic drug level monitoring: Secondary | ICD-10-CM

## 2021-12-17 LAB — POCT INR: INR: 2.2 (ref 2.0–3.0)

## 2021-12-17 NOTE — Patient Instructions (Signed)
continue taking '5mg'$  (1 tablet) daily except 7.5 mg (1.5 tablets) on Tuesdays. Please resume normal leafy veggies 2 times a week & stay consistent with greens each week. Recheck INR 4 weeks.  Call us with any new medications or concerns [phone] or [phone].

## 2021-12-26 ENCOUNTER — Encounter (HOSPITAL_COMMUNITY): Payer: Self-pay | Admitting: Emergency Medicine

## 2021-12-26 ENCOUNTER — Emergency Department (HOSPITAL_COMMUNITY): Payer: Medicare Other

## 2021-12-26 ENCOUNTER — Inpatient Hospital Stay (HOSPITAL_COMMUNITY)
Admission: EM | Admit: 2021-12-26 | Discharge: 2022-01-03 | DRG: 291 | Disposition: A | Discharge status: Skilled Nursing Facility | Payer: Medicare Other | Attending: Internal Medicine | Admitting: Internal Medicine

## 2021-12-26 ENCOUNTER — Other Ambulatory Visit: Payer: Self-pay

## 2021-12-26 DIAGNOSIS — Z881 Allergy status to other antibiotic agents status: Secondary | ICD-10-CM

## 2021-12-26 DIAGNOSIS — I110 Hypertensive heart disease with heart failure: Principal | ICD-10-CM | POA: Diagnosis present

## 2021-12-26 DIAGNOSIS — E66813 Obesity, class 3: Secondary | ICD-10-CM | POA: Diagnosis present

## 2021-12-26 DIAGNOSIS — I10 Essential (primary) hypertension: Secondary | ICD-10-CM | POA: Diagnosis present

## 2021-12-26 DIAGNOSIS — Z85828 Personal history of other malignant neoplasm of skin: Secondary | ICD-10-CM

## 2021-12-26 DIAGNOSIS — Z20822 Contact with and (suspected) exposure to covid-19: Secondary | ICD-10-CM | POA: Diagnosis present

## 2021-12-26 DIAGNOSIS — I509 Heart failure, unspecified: Principal | ICD-10-CM

## 2021-12-26 DIAGNOSIS — I4819 Other persistent atrial fibrillation: Secondary | ICD-10-CM | POA: Diagnosis present

## 2021-12-26 DIAGNOSIS — Z9081 Acquired absence of spleen: Secondary | ICD-10-CM | POA: Diagnosis not present

## 2021-12-26 DIAGNOSIS — E871 Hypo-osmolality and hyponatremia: Secondary | ICD-10-CM | POA: Diagnosis not present

## 2021-12-26 DIAGNOSIS — I2510 Atherosclerotic heart disease of native coronary artery without angina pectoris: Secondary | ICD-10-CM | POA: Diagnosis present

## 2021-12-26 DIAGNOSIS — I5033 Acute on chronic diastolic (congestive) heart failure: Secondary | ICD-10-CM | POA: Diagnosis present

## 2021-12-26 DIAGNOSIS — J40 Bronchitis, not specified as acute or chronic: Secondary | ICD-10-CM | POA: Diagnosis present

## 2021-12-26 DIAGNOSIS — Z79899 Other long term (current) drug therapy: Secondary | ICD-10-CM

## 2021-12-26 DIAGNOSIS — Z862 Personal history of diseases of the blood and blood-forming organs and certain disorders involving the immune mechanism: Secondary | ICD-10-CM | POA: Diagnosis not present

## 2021-12-26 DIAGNOSIS — E039 Hypothyroidism, unspecified: Secondary | ICD-10-CM | POA: Diagnosis present

## 2021-12-26 DIAGNOSIS — D849 Immunodeficiency, unspecified: Secondary | ICD-10-CM | POA: Diagnosis present

## 2021-12-26 DIAGNOSIS — F419 Anxiety disorder, unspecified: Secondary | ICD-10-CM | POA: Diagnosis present

## 2021-12-26 DIAGNOSIS — I420 Dilated cardiomyopathy: Secondary | ICD-10-CM | POA: Diagnosis present

## 2021-12-26 DIAGNOSIS — Z79890 Hormone replacement therapy (postmenopausal): Secondary | ICD-10-CM

## 2021-12-26 DIAGNOSIS — Z809 Family history of malignant neoplasm, unspecified: Secondary | ICD-10-CM

## 2021-12-26 DIAGNOSIS — J9621 Acute and chronic respiratory failure with hypoxia: Secondary | ICD-10-CM | POA: Diagnosis not present

## 2021-12-26 DIAGNOSIS — E7800 Pure hypercholesterolemia, unspecified: Secondary | ICD-10-CM | POA: Diagnosis present

## 2021-12-26 DIAGNOSIS — J189 Pneumonia, unspecified organism: Secondary | ICD-10-CM | POA: Diagnosis present

## 2021-12-26 DIAGNOSIS — Z66 Do not resuscitate: Secondary | ICD-10-CM | POA: Diagnosis not present

## 2021-12-26 DIAGNOSIS — Z955 Presence of coronary angioplasty implant and graft: Secondary | ICD-10-CM

## 2021-12-26 DIAGNOSIS — Z6841 Body Mass Index (BMI) 40.0 and over, adult: Secondary | ICD-10-CM

## 2021-12-26 DIAGNOSIS — Z7901 Long term (current) use of anticoagulants: Secondary | ICD-10-CM

## 2021-12-26 DIAGNOSIS — Z8249 Family history of ischemic heart disease and other diseases of the circulatory system: Secondary | ICD-10-CM

## 2021-12-26 LAB — CBC
HCT: 37.8 % (ref 36.0–46.0)
Hemoglobin: 12.8 g/dL (ref 12.0–15.0)
MCH: 30.5 pg (ref 26.0–34.0)
MCHC: 33.9 g/dL (ref 30.0–36.0)
MCV: 90.2 fL (ref 80.0–100.0)
Platelets: 415 10*3/uL — ABNORMAL HIGH (ref 150–400)
RBC: 4.19 MIL/uL (ref 3.87–5.11)
RDW: 14.8 % (ref 11.5–15.5)
WBC: 9.3 10*3/uL (ref 4.0–10.5)
nRBC: 0 % (ref 0.0–0.2)

## 2021-12-26 LAB — I-STAT VENOUS BLOOD GAS, ED
Acid-Base Excess: 4 mmol/L — ABNORMAL HIGH (ref 0.0–2.0)
Bicarbonate: 29.7 mmol/L — ABNORMAL HIGH (ref 20.0–28.0)
Calcium, Ion: 1.07 mmol/L — ABNORMAL LOW (ref 1.15–1.40)
HCT: 45 % (ref 36.0–46.0)
Hemoglobin: 15.3 g/dL — ABNORMAL HIGH (ref 12.0–15.0)
O2 Saturation: 94 %
Potassium: 3.4 mmol/L — ABNORMAL LOW (ref 3.5–5.1)
Sodium: 126 mmol/L — ABNORMAL LOW (ref 135–145)
TCO2: 31 mmol/L (ref 22–32)
pCO2, Ven: 46.3 mmHg (ref 44–60)
pH, Ven: 7.416 (ref 7.25–7.43)
pO2, Ven: 71 mmHg — ABNORMAL HIGH (ref 32–45)

## 2021-12-26 LAB — HEPATIC FUNCTION PANEL
ALT: 31 U/L (ref 0–44)
AST: 44 U/L — ABNORMAL HIGH (ref 15–41)
Albumin: 2.2 g/dL — ABNORMAL LOW (ref 3.5–5.0)
Alkaline Phosphatase: 69 U/L (ref 38–126)
Bilirubin, Direct: 0.3 mg/dL — ABNORMAL HIGH (ref 0.0–0.2)
Indirect Bilirubin: 0.6 mg/dL (ref 0.3–0.9)
Total Bilirubin: 0.9 mg/dL (ref 0.3–1.2)
Total Protein: 5.8 g/dL — ABNORMAL LOW (ref 6.5–8.1)

## 2021-12-26 LAB — MAGNESIUM: Magnesium: 1.7 mg/dL (ref 1.7–2.4)

## 2021-12-26 LAB — URINALYSIS, ROUTINE W REFLEX MICROSCOPIC
Bilirubin Urine: NEGATIVE
Glucose, UA: NEGATIVE mg/dL
Ketones, ur: NEGATIVE mg/dL
Nitrite: NEGATIVE
Protein, ur: NEGATIVE mg/dL
Specific Gravity, Urine: 1.002 — ABNORMAL LOW (ref 1.005–1.030)
pH: 5 (ref 5.0–8.0)

## 2021-12-26 LAB — LACTIC ACID, PLASMA
Lactic Acid, Venous: 1.3 mmol/L (ref 0.5–1.9)
Lactic Acid, Venous: 1.2 mmol/L (ref 0.5–1.9)

## 2021-12-26 LAB — BRAIN NATRIURETIC PEPTIDE: B Natriuretic Peptide: 377.9 pg/mL — ABNORMAL HIGH (ref 0.0–100.0)

## 2021-12-26 LAB — BASIC METABOLIC PANEL
Anion gap: 16 — ABNORMAL HIGH (ref 5–15)
BUN: 10 mg/dL (ref 8–23)
CO2: 27 mmol/L (ref 22–32)
Calcium: 8.6 mg/dL — ABNORMAL LOW (ref 8.9–10.3)
Chloride: 86 mmol/L — ABNORMAL LOW (ref 98–111)
Creatinine, Ser: 0.85 mg/dL (ref 0.44–1.00)
GFR, Estimated: >60 mL/min (ref >60)
Glucose, Bld: 138 mg/dL — ABNORMAL HIGH (ref 70–99)
Potassium: 3.5 mmol/L (ref 3.5–5.1)
Sodium: 129 mmol/L — ABNORMAL LOW (ref 135–145)

## 2021-12-26 LAB — RESP PANEL BY RT-PCR (FLU A&B, COVID) ARPGX2
Influenza A by PCR: NEGATIVE
Influenza B by PCR: NEGATIVE
SARS Coronavirus 2 by RT PCR: NEGATIVE

## 2021-12-26 LAB — PROTIME-INR
INR: 4.4 (ref 0.8–1.2)
Prothrombin Time: 41.9 seconds — ABNORMAL HIGH (ref 11.4–15.2)

## 2021-12-26 LAB — PROCALCITONIN: Procalcitonin: <0.1 ng/mL

## 2021-12-26 MED ORDER — SOTALOL HCL 120 MG PO TABS
120.0000 mg | ORAL_TABLET | Freq: Two times a day (BID) | ORAL | Status: DC
  Administered 2021-12-26 – 2022-01-03 (×16): 120 mg via ORAL
  Filled 2021-12-26 (×19): qty 1

## 2021-12-26 MED ORDER — ACETAMINOPHEN 325 MG PO TABS
650.0000 mg | ORAL_TABLET | Freq: Four times a day (QID) | ORAL | Status: DC | PRN
  Filled 2021-12-26: qty 2

## 2021-12-26 MED ORDER — ACETAMINOPHEN 650 MG RE SUPP: 650.0000 mg | Freq: Four times a day (QID) | RECTAL | Status: DC | PRN

## 2021-12-26 MED ORDER — LEVOTHYROXINE SODIUM 25 MCG PO TABS: 50.0000 ug | ORAL_TABLET | Freq: Every day | ORAL | Status: DC

## 2021-12-26 MED ORDER — FUROSEMIDE 10 MG/ML IJ SOLN
60.0000 mg | Freq: Two times a day (BID) | INTRAMUSCULAR | Status: DC
  Administered 2021-12-27: 60 mg via INTRAVENOUS
  Filled 2021-12-26: qty 6

## 2021-12-26 MED ORDER — ALBUTEROL SULFATE (2.5 MG/3ML) 0.083% IN NEBU
3.0000 mL | INHALATION_SOLUTION | RESPIRATORY_TRACT | Status: DC | PRN
  Administered 2021-12-31: 3 mL via RESPIRATORY_TRACT
  Filled 2021-12-26: qty 3

## 2021-12-26 MED ORDER — POLYETHYLENE GLYCOL 3350 17 G PO PACK
17.0000 g | PACK | Freq: Every day | ORAL | Status: DC | PRN
  Administered 2021-12-28 – 2021-12-31 (×3): 17 g via ORAL
  Filled 2021-12-26 (×3): qty 1

## 2021-12-26 MED ORDER — POTASSIUM CHLORIDE 10 MEQ/100ML IV SOLN
10.0000 meq | INTRAVENOUS | Status: AC
  Administered 2021-12-26 (×2): 10 meq via INTRAVENOUS
  Filled 2021-12-26 (×2): qty 100

## 2021-12-26 MED ORDER — ISOSORBIDE MONONITRATE ER 30 MG PO TB24
30.0000 mg | ORAL_TABLET | Freq: Every day | ORAL | Status: DC
  Administered 2021-12-27 – 2022-01-03 (×8): 30 mg via ORAL
  Filled 2021-12-26 (×8): qty 1

## 2021-12-26 MED ORDER — FUROSEMIDE 10 MG/ML IJ SOLN
40.0000 mg | Freq: Once | INTRAMUSCULAR | Status: AC
  Administered 2021-12-26: 40 mg via INTRAVENOUS
  Filled 2021-12-26: qty 4

## 2021-12-26 MED ORDER — MAGNESIUM SULFATE 2 GM/50ML IV SOLN
2.0000 g | Freq: Once | INTRAVENOUS | Status: AC
  Administered 2021-12-26: 2 g via INTRAVENOUS
  Filled 2021-12-26: qty 50

## 2021-12-26 MED ORDER — SODIUM CHLORIDE 0.9% FLUSH
3.0000 mL | Freq: Two times a day (BID) | INTRAVENOUS | Status: DC
  Administered 2021-12-27 – 2022-01-02 (×14): 3 mL via INTRAVENOUS

## 2021-12-26 MED ORDER — METOPROLOL SUCCINATE ER 25 MG PO TB24: 25.0000 mg | ORAL_TABLET | Freq: Every day | ORAL | Status: DC

## 2021-12-26 MED ORDER — WARFARIN - PHARMACIST DOSING INPATIENT: Freq: Every day | Status: DC

## 2021-12-26 MED ORDER — POTASSIUM CHLORIDE CRYS ER 20 MEQ PO TBCR
40.0000 meq | EXTENDED_RELEASE_TABLET | Freq: Once | ORAL | Status: DC
  Filled 2021-12-26: qty 2

## 2021-12-26 MED ORDER — LEVOTHYROXINE SODIUM 50 MCG PO TABS
50.0000 ug | ORAL_TABLET | Freq: Every day | ORAL | Status: DC
  Administered 2021-12-27 – 2022-01-03 (×8): 50 ug via ORAL
  Filled 2021-12-26 (×8): qty 1

## 2021-12-26 NOTE — ED Notes (Signed)
Called to activate the purple man 

## 2021-12-26 NOTE — ED Notes (Signed)
Lab stated Protime INR and procalcitonin to be added on to existing blood.

## 2021-12-26 NOTE — ED Notes (Signed)
Pt calling out repeatedly requesting to get out of bed and walk around. Pt states, "I feel like I'm gonna die in here. I gotta get up and do something. I can't just sit around." Pt dyspneic at rest and tachycardic up to 120 with minimal movement. Educated patient on importance of rest to healing and recovering from illness. This RN uncomfortable getting patient up out of bed with current condition and VS, same expressed to patient. Pt continues calling out asking staff to let her out of bed. Pt refuses both side rails on stretcher to be up. One side rail lowered per pt request. Bedside table placed next to patient and dinner tray delivered to patient. Pt verbalizes understanding to call for assistance prior to getting up. Call light within reach.

## 2021-12-26 NOTE — ED Triage Notes (Signed)
TO ED via [facility] from home with c/o shortness of breath-- and fever earlier this week-- intermittent--  Has been only taking half the prescribed dose of lasix because she states she was too "sick to get up to the bathroom so often" --  Rales, rhonchi throughout- wet productive cough-- green sputum .    A/O x4. W/d

## 2021-12-26 NOTE — H&P (Addendum)
History and Physical   [name] ERX:[id number] DOB: [birth year] DOA: 12/26/2021  PCP: Elisabeth Cara, PA-C   Patient coming from: Home  Chief Complaint: Shortness of breath  HPI: [name] is a 76 y.o. female with medical history significant of TTP, hypertension, obesity, anxiety, atrial fibrillation, hyperlipidemia, low back pain, CAD, diastolic CHF, hypothyroidism, mycoplasma pneumonia, splenectomy presenting with shortness of breath.  Patient reports about a week of symptoms that include fever, cough productive of green sputum, fatigue.  The symptoms have been persistent other than fever which has resolved.  During this time she has been taking only half of her prescribed dose of Lasix because she did not feel well enough to get up and go to the bathroom that frequently.  She was hoping her symptoms would improve but they have gotten worse.  She does report increased edema as well.  She denies chills, chest pain, abdominal pain, constipation, diarrhea, nausea, vomiting.  ED Course: Vital signs in the ED significant for heart rate in the 90s to 100s, blood pressure in the 950D to 326 systolic.  Respiratory rate in the 20s.  Lab workup included CMP with sodium 129, chloride 86, gap 16 with normal bicarb, glucose 138, calcium 8.6, protein  5.8, albumin 2.8, AST 44.  CBC with platelets of 415.  PT and INR pending.  BNP elevated to 377.  Lactic acid normal with repeat pending.  Respiratory panel for flu and COVID-negative.  Procalcitonin and blood cultures pending.  VBG with normal pH and normal pCO2.  Chest x-ray showing chronic interstitial changes with cardiomegaly and possible interstitial edema not able to be ruled out.  Patient received dose of Lasix in the ED.  Review of Systems: As per HPI otherwise all other systems reviewed and are negative.  Past Medical History:  Diagnosis Date   CAD (coronary artery disease)    a. LHC 3/17: mLAD 80, D1 50, pRCA 30, EF  25-35%; PCI: 3.5 x 16 mm Synergy DES to mLAD // Myoview 08/2018: EF 54, normal perfusion; Low Risk   Chronic diastolic CHF 08/12/4578   Echocardiogram 08/2018: EF 55-60, mild septal basal hypertrophy, Gr 2 DD, elevated LVEDP, mild LAE, mild AI   Dilated cardiomyopathy >> EF returned to normal in NSR    Probable Tachycardia Mediated // a. Echo 12/16: Mild concentric LVH, EF 25-30%, anteroseptal akinesis, inferoseptal akinesis, anterior akinesis, trivial AI, mild MR, moderate LAE, trivial TR, trivial PI, PASP 33 mmHg  //  b. Echo 2/17: Mild LVH, EF 25-30%, diffuse HK, mild LAE  //  c. Echo 7/17: mild LVH, EF 50-55%, no RWMA, trivial AI, mild to mod LAE, mild RAE   Febrile seizure ([place]) 2004   "during TTP thing; cause my temp was 106"   High cholesterol    History of blood transfusion 2004   "several; related to TTP"   Hypertension    Hypothyroidism    Morbid obesity ([place]) 01/07/2013   Osteoarthritis of back    Paroxysmal atrial fibrillation ([place]) 08/01/2011   a. 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    Past Surgical History:  Procedure Laterality Date   ABDOMINAL EXPLORATION SURGERY     "opened me up 2-3 times for blocked intestines"   APPENDECTOMY     BACK SURGERY  BASAL CELL CARCINOMA EXCISION Right    "neck"   CARDIAC CATHETERIZATION N/A 04/17/2015   Procedure: Left Heart Cath and Coronary Angiography;  Surgeon: Jettie Booze, MD;  Location: [facility];  Service: Cardiovascular;  Laterality: N/A;   CARDIAC CATHETERIZATION  04/17/2015   Procedure: Coronary Stent Intervention;  Surgeon: Jettie Booze, MD;  Location: [facility];  Service: Cardiovascular;;   CARDIOVERSION  06/2002; 03/2005; 05/2005   Archie Endo 06/16/2010   CARDIOVERSION N/A 10/03/2015   Procedure:  CARDIOVERSION;  Surgeon: Deboraha Sprang, MD;  Location: [facility];  Service: Cardiovascular;  Laterality: N/A;   CATARACT EXTRACTION W/ INTRAOCULAR LENS  IMPLANT, BILATERAL Bilateral    CHOLECYSTECTOMY OPEN     CORONARY ANGIOPLASTY     DILATION AND CURETTAGE OF UTERUS     S/P "miscarriage"   FOOT SURGERY Right    "for nerve damage"   SALIVARY STONE REMOVAL Left    "had tumor wrapped around it"   SPLENECTOMY, TOTAL     Archie Endo 06/16/2010   TEE WITHOUT CARDIOVERSION N/A 09/30/2015   Procedure: TRANSESOPHAGEAL ECHOCARDIOGRAM (TEE);  Surgeon: Skeet Latch, MD;  Location: [facility];  Service: Cardiovascular;  Laterality: N/A;   THORACIC DISCECTOMY  05/2002   THYROIDECTOMY, PARTIAL  X 2   "grew back"   TONSILLECTOMY     TUBAL LIGATION     TUMOR EXCISION     "benign tumor in my neck"   VAGINAL HYSTERECTOMY      Social History  reports that she has never smoked. She has never used smokeless tobacco. She reports that she does not drink alcohol and does not use drugs.  Allergies  Allergen Reactions   Ceftriaxone Rash and Other (See Comments)    "TTP"/Was hospitalized and in a coma for 60 days, per patient (Thrombotic Thrombocytopenic Purpura)   Penicillins Hives, Rash and Other (See Comments)    Cannot tolerate any "-CILLINS"; causes welts!! Has patient had a PCN reaction causing immediate rash, facial/tongue/throat swelling, SOB or lightheadedness with hypotension: Yes Has patient had a PCN reaction causing severe rash involving mucus membranes or skin necrosis: No Has patient had a PCN reaction that required hospitalization No Has patient had a PCN reaction occurring within the last 10 years: No If all of the above answers are "NO", then may proceed with Cephalosporin use.   Levofloxacin Other (See Comments)    Thrombocytopenia     Levofloxacin Other (See Comments)    Dropped blood platelets extremely low   Oxycodone Itching   Tape Other (See Comments)    Prefers paper or cloth  tape Other reaction(s): Other (See Comments) Prefers paper or cloth tape Other reaction(s): Other (See Comments) Prefers paper or cloth tape   Alendronate Rash   Aspirin Other (See Comments)    Patient stated she is unable to take due to blood thinner    Oxycodone-Acetaminophen Itching   Penicillin G Rash    Family History  Problem Relation Age of Onset   Heart attack Mother    Cancer Sister    Cancer Brother   Reviewed on admission  Prior to Admission medications   Medication Sig Start Date End Date Taking? Authorizing Provider  albuterol (ACCUNEB) 0.63 MG/3ML nebulizer solution Take 3 mLs by nebulization every 4 (four) hours as needed for wheezing or shortness of breath. 06/09/21  Yes [provider]  Cholecalciferol (VITAMIN D3) 2000 units capsule Take 2,000 Units by mouth every morning.   Yes [provider]  furosemide (  LASIX) 40 MG tablet Take 1 tablet (40 mg total) by mouth daily. Please call [phone] to schedule an overdue appointment for future refills. Thank you. 2nd attempt. 08/27/21  Yes Jerline Pain, MD  isosorbide mononitrate (IMDUR) 30 MG 24 hr tablet TAKE 1 TABLET BY MOUTH EVERY DAY Patient taking differently: Take 30 mg by mouth daily. 10/12/21  Yes Jerline Pain, MD  levothyroxine (SYNTHROID, LEVOTHROID) 50 MCG tablet Take 50 mcg by mouth daily before breakfast.   Yes [provider]  meclizine (ANTIVERT) 25 MG tablet Take 12.5-25 mg by mouth 3 (three) times daily as needed for dizziness. 06/08/21  Yes [provider]  metoprolol succinate (TOPROL-XL) 25 MG 24 hr tablet TAKE 1 TABLET BY MOUTH EVERY DAY WITH OR IMMEDIATELY FOLLOWING A MEAL Patient taking differently: Take 25 mg by mouth daily. 10/05/21  Yes Jerline Pain, MD  potassium chloride (KLOR-CON) 10 MEQ tablet Take 10 mEq by mouth daily.   Yes [provider]  sotalol (BETAPACE) 120 MG tablet TAKE 1 TABLET BY MOUTH TWICE A DAY 10/12/21  Yes Jerline Pain, MD   warfarin (COUMADIN) 5 MG tablet TAKE 1 TO 1.5 TABLETS BY MOUTH ONCE DAILY AS DIRECTED BY ANTICOAGULATION CLINIC. Patient taking differently: Take 5-7.5 mg by mouth See admin instructions. Take 1 ('5mg'$ ) tablet daily except on Tuesday taking 7.5 mg ( 1 & 1/2) 12/03/21  Yes Jerline Pain, MD  nitroGLYCERIN (NITROSTAT) 0.4 MG SL tablet PLACE 1 TABLET (0.4 MG TOTAL) UNDER THE TONGUE EVERY 5 (FIVE) MINUTES AS NEEDED FOR CHEST PAIN. Patient not taking: Reported on 12/26/2021 09/24/18   Burtis Junes, NP    Physical Exam: Vitals:   12/26/21 1117 12/26/21 1200 12/26/21 1230 12/26/21 1300  BP:  (!) 121/97 116/71 (!) 128/99  Pulse:  98 98 97  Resp:  (!) 24 (!) 23 (!) 23  Temp:      TempSrc:      SpO2: (S) 94% 95% 95% 96%  Weight:      Height:        Physical Exam Constitutional:      General: She is not in acute distress.    Appearance: Normal appearance. She is obese.  HENT:     Head: Normocephalic and atraumatic.     Mouth/Throat:     Mouth: Mucous membranes are moist.     Pharynx: Oropharynx is clear.  Eyes:     Extraocular Movements: Extraocular movements intact.     Pupils: Pupils are equal, round, and reactive to light.  Cardiovascular:     Rate and Rhythm: Normal rate and regular rhythm.     Pulses: Normal pulses.     Heart sounds: Normal heart sounds.  Pulmonary:     Effort: Pulmonary effort is normal. No respiratory distress.     Breath sounds: Rales present.  Abdominal:     General: Bowel sounds are normal. There is no distension.     Palpations: Abdomen is soft.     Tenderness: There is no abdominal tenderness.  Musculoskeletal:        General: No swelling or deformity.     Right lower leg: Edema present.     Left lower leg: Edema present.  Skin:    General: Skin is warm and dry.  Neurological:     General: No focal deficit present.     Mental Status: Mental status is at baseline.    Labs on Admission: I have personally reviewed following labs and imaging  studies  CBC: Recent Labs  Lab 12/26/21 1203 12/26/21 1245  WBC 9.3  --   HGB 12.8 15.3*  HCT 37.8 45.0  MCV 90.2  --   PLT 415*  --     Basic Metabolic Panel: Recent Labs  Lab 12/26/21 1203 12/26/21 1245  NA 129* 126*  K 3.5 3.4*  CL 86*  --   CO2 27  --   GLUCOSE 138*  --   BUN 10  --   CREATININE 0.85  --   CALCIUM 8.6*  --     GFR: Estimated Creatinine Clearance: 71.1 mL/min (by C-G formula based on SCr of 0.85 mg/dL).  Liver Function Tests: Recent Labs  Lab 12/26/21 1203  AST 44*  ALT 31  ALKPHOS 69  BILITOT 0.9  PROT 5.8*  ALBUMIN 2.2*    Urine analysis:    Component Value Date/Time   COLORURINE YELLOW 07/05/2017 2222   APPEARANCEUR CLEAR 07/05/2017 2222   LABSPEC 1.013 07/05/2017 2222   PHURINE 5.0 07/05/2017 2222   GLUCOSEU NEGATIVE 07/05/2017 2222   HGBUR NEGATIVE 07/05/2017 2222   BILIRUBINUR NEGATIVE 07/05/2017 2222   KETONESUR 5 (A) 07/05/2017 2222   PROTEINUR NEGATIVE 07/05/2017 2222   UROBILINOGEN 0.2 10/17/2008 0024   NITRITE NEGATIVE 07/05/2017 2222   LEUKOCYTESUR MODERATE (A) 07/05/2017 2222    Radiological Exams on Admission: DG CHEST PORT 1 VIEW  Result Date: 12/26/2021 CLINICAL DATA:  Shortness of breath EXAM: PORTABLE CHEST 1 VIEW COMPARISON:  03/30/2021 FINDINGS: 1135 hours. The cardio pericardial silhouette is enlarged. Interstitial markings are diffusely coarsened with chronic features. The lungs are clear without focal pneumonia, edema, pneumothorax or pleural effusion. Bones are diffusely demineralized. Telemetry leads overlie the chest. IMPRESSION: Chronic interstitial coarsening with cardiomegaly. Component of mild interstitial edema not excluded. Electronically Signed   By: Misty Stanley M.D.   On: 12/26/2021 11:54    EKG: Independently reviewed.  Atrial fibrillation at 93 bpm.  PVC noted.  Nonspecific T wave changes.  Assessment/Plan Principal Problem:   Acute on chronic diastolic CHF (congestive heart failure)  (HCC) Active Problems:   Hypertension   Morbid obesity ([place])   Persistent atrial fibrillation (HCC)   Hypothyroidism (acquired)   History of splenectomy   History of TTP (thrombotic thrombocytopenic purpura)   Acute on chronic diastolic CHF > Last echo was March of this year with EF 55-60%, indeterminate diastolic function and moderately reduced RV function. > Presenting in the setting of only taking half of her prescribed Lasix dose because she did not feel well enough to get back and forth to the bathroom so frequently while dealing with a URI. > Now with rales and significant edema in some tachypnea in the ED and BNP elevated to 377.. > Received dose of Lasix in the ED. - Monitor on telemetry - Continue with Lasix 60 mg IV twice daily - Strict I's and O's, daily weights - Echocardiogram - Continue home metoprolol, Imdur  Viral URI > Patient has had a week of fevers cough productive of green sputum and fatigue.  This is what caused her to take decreased dose of her Lasix as above. > No evidence of leukocytosis to indicate bacterial etiology.  No evidence of pneumonia on chest x-ray.  Negative for flu and COVID in the ED. > Procalcitonin pending in the ED.  Will also add on full respiratory viral panel to further clarify etiology of symptoms. - Follow-up RVP - Supportive care - Note, if there becomes a concern of bacterial  infection she has multiple allergies  Hypertension - Continue home Imdur, metoprolol - On Lasix as above  Atrial fibrillation - Continue home metoprolol and sotalol - Continue home warfarin with pharmacy consult  Hypothyroidism - Continue home Synthroid   Obesity History of TTP History of splenectomy - Noted  DVT prophylaxis: Warfarin Code Status:   Full Family Communication:  None on admission.  Patient states her family is with her earlier and are aware of admission. Disposition Plan:   Patient is from:  Home  Anticipated DC  to:  Home  Anticipated DC date:  1 to 3 days  Anticipated DC barriers: None  Consults called:  None Admission status:  Observation, telemetry  Severity of Illness: The appropriate patient status for this patient is OBSERVATION. Observation status is judged to be reasonable and necessary in order to provide the required intensity of service to ensure the patient's safety. The patient's presenting symptoms, physical exam findings, and initial radiographic and laboratory data in the context of their medical condition is felt to place them at decreased risk for further clinical deterioration. Furthermore, it is anticipated that the patient will be medically stable for discharge from the hospital within 2 midnights of admission.    Marcelyn Bruins MD [facility]  How to contact the [facility] Attending or Consulting provider [place] or covering provider during after hours [place], for this patient?   Check the care team in [facility] and look for a) attending/consulting [facility] provider listed and b) the [facility] team listed Log into [email or web address] and use [place]'s universal password to access. If you do not have the password, please contact the hospital operator. Locate the [facility] provider you are looking for under [facility] and page to a number that you can be directly reached. If you still have difficulty reaching the provider, please page the [facility] (Director on Call) for the Hospitalists listed on amion for assistance.  12/26/2021, 2:39 PM

## 2021-12-26 NOTE — Progress Notes (Signed)
ANTICOAGULATION CONSULT NOTE - Initial Consult  Pharmacy Consult for warfarin  Indication: atrial fibrillation  Allergies  Allergen Reactions   Ceftriaxone Rash and Other (See Comments)    "TTP"/Was hospitalized and in a coma for 60 days, per patient (Thrombotic Thrombocytopenic Purpura)   Penicillins Hives, Rash and Other (See Comments)    Cannot tolerate any "-CILLINS"; causes welts!! Has patient had a PCN reaction causing immediate rash, facial/tongue/throat swelling, SOB or lightheadedness with hypotension: Yes Has patient had a PCN reaction causing severe rash involving mucus membranes or skin necrosis: No Has patient had a PCN reaction that required hospitalization No Has patient had a PCN reaction occurring within the last 10 years: No If all of the above answers are "NO", then may proceed with Cephalosporin use.   Levofloxacin Other (See Comments)    Thrombocytopenia     Levofloxacin Other (See Comments)    Dropped blood platelets extremely low   Oxycodone Itching   Tape Other (See Comments)    Prefers paper or cloth tape Other reaction(s): Other (See Comments) Prefers paper or cloth tape Other reaction(s): Other (See Comments) Prefers paper or cloth tape   Alendronate Rash   Aspirin Other (See Comments)    Patient stated she is unable to take due to blood thinner    Oxycodone-Acetaminophen Itching   Penicillin G Rash    Patient Measurements: Height: '5\' 4"'$  (162.6 cm) Weight: 117.9 kg (260 lb) IBW/kg (Calculated) : 54.7 Heparin Dosing Weight: 83.2 kg  Vital Signs: Temp: 97.9 F (36.6 C) (11/26 1111) Temp Source: Oral (11/26 1111) BP: 128/99 (11/26 1300) Pulse Rate: 97 (11/26 1300)  Labs: Recent Labs    12/26/21 1203 12/26/21 1245  HGB 12.8 15.3*  HCT 37.8 45.0  PLT 415*  --   CREATININE 0.85  --     Estimated Creatinine Clearance: 71.1 mL/min (by C-G formula based on SCr of 0.85 mg/dL).   Medical History: Past Medical History:  Diagnosis Date    CAD (coronary artery disease)    a. LHC 3/17: mLAD 80, D1 50, pRCA 30, EF 25-35%; PCI: 3.5 x 16 mm Synergy DES to mLAD // Myoview 08/2018: EF 54, normal perfusion; Low Risk   Chronic diastolic CHF 2/50/5397   Echocardiogram 08/2018: EF 55-60, mild septal basal hypertrophy, Gr 2 DD, elevated LVEDP, mild LAE, mild AI   Dilated cardiomyopathy >> EF returned to normal in NSR    Probable Tachycardia Mediated // a. Echo 12/16: Mild concentric LVH, EF 25-30%, anteroseptal akinesis, inferoseptal akinesis, anterior akinesis, trivial AI, mild MR, moderate LAE, trivial TR, trivial PI, PASP 33 mmHg  //  b. Echo 2/17: Mild LVH, EF 25-30%, diffuse HK, mild LAE  //  c. Echo 7/17: mild LVH, EF 50-55%, no RWMA, trivial AI, mild to mod LAE, mild RAE   Febrile seizure ([place]) 2004   "during TTP thing; cause my temp was 106"   High cholesterol    History of blood transfusion 2004   "several; related to TTP"   Hypertension    Hypothyroidism    Morbid obesity ([place]) 01/07/2013   Osteoarthritis of back    Paroxysmal atrial fibrillation ([place]) 08/01/2011   a. 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 Not on DOAC d/t  cost issue. Pharmacy consulted to dose warfarin while inpatient. Patient took last dose of warfarin 11/26 @ 0900  CBC Hgb 15.3/Plt 415 INR for today is pending  PTA regimen: warfarin '5mg'$  QD except 7.'5mg'$  on Tuesdays   Goal of Therapy:  INR 2-3 Monitor platelets by anticoagulation protocol: Yes   Plan:  No further warfarin today (received dose PTA) Daily INR Daily CBC F/u s/sx bleeding    Wilson Singer, PharmD Clinical Pharmacist 12/26/2021 2:21 PM

## 2021-12-26 NOTE — ED Provider Notes (Signed)
[facility] EMERGENCY DEPARTMENT Provider Note  CSN: [id number] Arrival date & time: 12/26/21 1106  Chief Complaint(s) Shortness of Breath  HPI [name] is a 76 y.o. female with history of CAD, CHF obesity, A-fib on warfarin presenting to the emergency department with shortness of breath.  Patient reports associated 1 week of fatigue, cough, fevers and chills with fever of 101.  Cough is productive of green sputum.  She reports that she has also been taking half doses of her Lasix as she was so weak she is not able to get out of bed and urinate.  She reports that she waited to come to the hospital as she was hoping her symptoms would improve on their own. No chest pain, recent travel or surgeries. No sick contacts. No runny nose, sore  throat   Past Medical History Past Medical History:  Diagnosis Date   CAD (coronary artery disease)    a. LHC 3/17: mLAD 80, D1 50, pRCA 30, EF 25-35%; PCI: 3.5 x 16 mm Synergy DES to mLAD // Myoview 08/2018: EF 54, normal perfusion; Low Risk   Chronic diastolic CHF 07/21/3084   Echocardiogram 08/2018: EF 55-60, mild septal basal hypertrophy, Gr 2 DD, elevated LVEDP, mild LAE, mild AI   Dilated cardiomyopathy >> EF returned to normal in NSR    Probable Tachycardia Mediated // a. Echo 12/16: Mild concentric LVH, EF 25-30%, anteroseptal akinesis, inferoseptal akinesis, anterior akinesis, trivial AI, mild MR, moderate LAE, trivial TR, trivial PI, PASP 33 mmHg  //  b. Echo 2/17: Mild LVH, EF 25-30%, diffuse HK, mild LAE  //  c. Echo 7/17: mild LVH, EF 50-55%, no RWMA, trivial AI, mild to mod LAE, mild RAE   Febrile seizure ([place]) 2004   "during TTP thing; cause my temp was 106"   High cholesterol    History of blood transfusion 2004   "several; related to TTP"   Hypertension    Hypothyroidism    Morbid obesity ([place]) 01/07/2013   Osteoarthritis of back    Paroxysmal atrial fibrillation ([place]) 08/01/2011   a. 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   Patient Active Problem List   Diagnosis Date Noted   Acute on chronic diastolic CHF (congestive heart failure) ([place]) 12/26/2021   Chest pain 03/30/2021   CAD (coronary artery disease) 03/30/2021   Renal insufficiency 08/16/2020   Cough 10/16/2017   History of splenectomy 07/20/2017   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   Mycoplasma pneumonia 07/06/2017   Postural dizziness with presyncope    Bronchitis 07/05/2017   Chronic diastolic CHF 57/84/6962   Hyperglycemia 09/23/2016   Age-related osteoporosis without current pathological fracture 08/27/2015   Vitamin D deficiency 08/27/2015   Hyperlipidemia 08/06/2015   Persistent atrial fibrillation ([place]) 04/18/2015   Cardiomyopathy, ischemic 04/18/2015   Hypertensive heart disease with heart failure ([place]) 04/15/2015   Anxiety 04/15/2015   Encounter for therapeutic drug monitoring 01/21/2015   Dyspnea on exertion 01/05/2015   Chronic anticoagulation 05/05/2014   Primary insomnia 05/05/2014   Long-term use of high-risk medication 02/05/2014   Hypothyroidism (acquired) 06/14/2013   Localized osteoporosis without current pathological fracture 06/14/2013   Low back pain 06/14/2013   Morbid obesity ([place]) 01/07/2013   Polypharmacy 01/07/2013   Hypertension 08/01/2011   Thrombotic thrombocytopenic  purpura ([place]) 05/03/2011   Home Medication(s) Prior to Admission medications   Medication Sig Start Date End Date Taking? Authorizing Provider  albuterol (ACCUNEB) 0.63 MG/3ML nebulizer solution Take 3 mLs by nebulization every 4 (four) hours as needed for wheezing or shortness of breath. 06/09/21  Yes [provider]  Cholecalciferol (VITAMIN D3) 2000 units capsule Take 2,000 Units by mouth every  morning.   Yes [provider]  furosemide (LASIX) 40 MG tablet Take 1 tablet (40 mg total) by mouth daily. Please call [phone] to schedule an overdue appointment for future refills. Thank you. 2nd attempt. 08/27/21  Yes Jerline Pain, MD  isosorbide mononitrate (IMDUR) 30 MG 24 hr tablet TAKE 1 TABLET BY MOUTH EVERY DAY Patient taking differently: Take 30 mg by mouth daily. 10/12/21  Yes Jerline Pain, MD  levothyroxine (SYNTHROID, LEVOTHROID) 50 MCG tablet Take 50 mcg by mouth daily before breakfast.   Yes [provider]  meclizine (ANTIVERT) 25 MG tablet Take 12.5-25 mg by mouth 3 (three) times daily as needed for dizziness. 06/08/21  Yes [provider]  metoprolol succinate (TOPROL-XL) 25 MG 24 hr tablet TAKE 1 TABLET BY MOUTH EVERY DAY WITH OR IMMEDIATELY FOLLOWING A MEAL Patient taking differently: Take 25 mg by mouth daily. 10/05/21  Yes Jerline Pain, MD  potassium chloride (KLOR-CON) 10 MEQ tablet Take 10 mEq by mouth daily.   Yes [provider]  sotalol (BETAPACE) 120 MG tablet TAKE 1 TABLET BY MOUTH TWICE A DAY 10/12/21  Yes Jerline Pain, MD  warfarin (COUMADIN) 5 MG tablet TAKE 1 TO 1.5 TABLETS BY MOUTH ONCE DAILY AS DIRECTED BY ANTICOAGULATION CLINIC. Patient taking differently: Take 5-7.5 mg by mouth See admin instructions. Take 1 ('5mg'$ ) tablet daily except on Tuesday taking 7.5 mg ( 1 & 1/2) 12/03/21  Yes Jerline Pain, MD  nitroGLYCERIN (NITROSTAT) 0.4 MG SL tablet PLACE 1 TABLET (0.4 MG TOTAL) UNDER THE TONGUE EVERY 5 (FIVE) MINUTES AS NEEDED FOR CHEST PAIN. Patient not taking: Reported on 12/26/2021 09/24/18   Burtis Junes, NP                                                                                                                                    Past Surgical History Past Surgical History:  Procedure Laterality Date   ABDOMINAL EXPLORATION SURGERY     "opened me up 2-3 times for blocked intestines"   APPENDECTOMY     BACK  SURGERY     BASAL CELL CARCINOMA EXCISION Right    "neck"   CARDIAC CATHETERIZATION N/A 04/17/2015   Procedure: Left Heart Cath and Coronary Angiography;  Surgeon: Jettie Booze, MD;  Location: [facility];  Service: Cardiovascular;  Laterality: N/A;   CARDIAC CATHETERIZATION  04/17/2015   Procedure: Coronary Stent Intervention;  Surgeon: Jettie Booze, MD;  Location: [facility];  Service: Cardiovascular;;  CARDIOVERSION  06/2002; 03/2005; 05/2005   Archie Endo 06/16/2010   CARDIOVERSION N/A 10/03/2015   Procedure: CARDIOVERSION;  Surgeon: Deboraha Sprang, MD;  Location: [facility];  Service: Cardiovascular;  Laterality: N/A;   CATARACT EXTRACTION W/ INTRAOCULAR LENS  IMPLANT, BILATERAL Bilateral    CHOLECYSTECTOMY OPEN     CORONARY ANGIOPLASTY     DILATION AND CURETTAGE OF UTERUS     S/P "miscarriage"   FOOT SURGERY Right    "for nerve damage"   SALIVARY STONE REMOVAL Left    "had tumor wrapped around it"   SPLENECTOMY, TOTAL     Archie Endo 06/16/2010   TEE WITHOUT CARDIOVERSION N/A 09/30/2015   Procedure: TRANSESOPHAGEAL ECHOCARDIOGRAM (TEE);  Surgeon: Skeet Latch, MD;  Location: [facility] ENDOSCOPY;  Service: Cardiovascular;  Laterality: N/A;   THORACIC DISCECTOMY  05/2002   THYROIDECTOMY, PARTIAL  X 2   "grew back"   TONSILLECTOMY     TUBAL LIGATION     TUMOR EXCISION     "benign tumor in my neck"   VAGINAL HYSTERECTOMY     Family History Family History  Problem Relation Age of Onset   Heart attack Mother    Cancer Sister    Cancer Brother     Social History Social History   Tobacco Use   Smoking status: Never   Smokeless tobacco: Never  Vaping Use   Vaping Use: Never used  Substance Use Topics   Alcohol use: No   Drug use: No   Allergies Ceftriaxone, Penicillins, Levofloxacin, Levofloxacin, Oxycodone, Tape, Alendronate, Aspirin, Oxycodone-acetaminophen, and Penicillin g  Review of Systems Review of Systems  All other systems reviewed and are  negative.   Physical Exam Vital Signs  I have reviewed the triage vital signs BP (!) 128/99   Pulse 97   Temp 97.9 F (36.6 C) (Oral)   Resp (!) 23   Ht '5\' 4"'$  (1.626 m)   Wt 117.9 kg   SpO2 96%   BMI 44.63 kg/m  Physical Exam Vitals and nursing note reviewed.  Constitutional:      General: She is not in acute distress.    Appearance: She is well-developed. She is ill-appearing.  HENT:     Head: Normocephalic and atraumatic.     Mouth/Throat:     Mouth: Mucous membranes are moist.  Eyes:     Pupils: Pupils are equal, round, and reactive to light.  Cardiovascular:     Rate and Rhythm: Normal rate and regular rhythm.     Heart sounds: No murmur heard. Pulmonary:     Comments: Mild increased work of breathing, tachypnea, bibasilar crackles more prominent in the right lower lung fields Abdominal:     General: Abdomen is flat.     Palpations: Abdomen is soft.     Tenderness: There is no abdominal tenderness.  Musculoskeletal:        General: No tenderness.     Right lower leg: Edema present.     Left lower leg: Edema present.  Skin:    General: Skin is warm and dry.  Neurological:     General: No focal deficit present.     Mental Status: She is alert. Mental status is at baseline.  Psychiatric:        Mood and Affect: Mood normal.        Behavior: Behavior normal.     ED Results and Treatments Labs (all labs ordered are listed, but only abnormal results are displayed) Labs Reviewed  BASIC METABOLIC PANEL - Abnormal;  Notable for the following components:      Result Value   Sodium 129 (*)    Chloride 86 (*)    Glucose, Bld 138 (*)    Calcium 8.6 (*)    Anion gap 16 (*)    All other components within normal limits  CBC - Abnormal; Notable for the following components:   Platelets 415 (*)    All other components within normal limits  BRAIN NATRIURETIC PEPTIDE - Abnormal; Notable for the following components:   B Natriuretic Peptide 377.9 (*)    All other  components within normal limits  HEPATIC FUNCTION PANEL - Abnormal; Notable for the following components:   Total Protein 5.8 (*)    Albumin 2.2 (*)    AST 44 (*)    Bilirubin, Direct 0.3 (*)    All other components within normal limits  I-STAT VENOUS BLOOD GAS, ED - Abnormal; Notable for the following components:   pO2, Ven 71 (*)    Bicarbonate 29.7 (*)    Acid-Base Excess 4.0 (*)    Sodium 126 (*)    Potassium 3.4 (*)    Calcium, Ion 1.07 (*)    Hemoglobin 15.3 (*)    All other components within normal limits  RESP PANEL BY RT-PCR (FLU A&B, COVID) ARPGX2  CULTURE, BLOOD (ROUTINE X 2)  CULTURE, BLOOD (ROUTINE X 2)  RESPIRATORY PANEL BY PCR  LACTIC ACID, PLASMA  LACTIC ACID, PLASMA  URINALYSIS, ROUTINE W REFLEX MICROSCOPIC  PROCALCITONIN  PROTIME-INR  MAGNESIUM                                                                                                                          Radiology DG CHEST PORT 1 VIEW  Result Date: 12/26/2021 CLINICAL DATA:  Shortness of breath EXAM: PORTABLE CHEST 1 VIEW COMPARISON:  03/30/2021 FINDINGS: 1135 hours. The cardio pericardial silhouette is enlarged. Interstitial markings are diffusely coarsened with chronic features. The lungs are clear without focal pneumonia, edema, pneumothorax or pleural effusion. Bones are diffusely demineralized. Telemetry leads overlie the chest. IMPRESSION: Chronic interstitial coarsening with cardiomegaly. Component of mild interstitial edema not excluded. Electronically Signed   By: Misty Stanley M.D.   On: 12/26/2021 11:54    Pertinent labs & imaging results that were available during my care of the patient were reviewed by me and considered in my medical decision making (see MDM for details).  Medications Ordered in ED Medications  sotalol (BETAPACE) tablet 120 mg (has no administration in time range)  metoprolol succinate (TOPROL-XL) 24 hr tablet 25 mg (has no administration in time range)  isosorbide  mononitrate (IMDUR) 24 hr tablet 30 mg (has no administration in time range)  levothyroxine (SYNTHROID) tablet 50 mcg (has no administration in time range)  albuterol (ACCUNEB) nebulizer solution 3 mL (has no administration in time range)  furosemide (LASIX) injection 60 mg (has no administration in time range)  sodium chloride flush (NS) 0.9 % injection 3 mL (has no  administration in time range)  acetaminophen (TYLENOL) tablet 650 mg (has no administration in time range)    Or  acetaminophen (TYLENOL) suppository 650 mg (has no administration in time range)  polyethylene glycol (MIRALAX / GLYCOLAX) packet 17 g (has no administration in time range)  furosemide (LASIX) injection 40 mg (40 mg Intravenous Given 12/26/21 1225)                                                                                                                                     Procedures Procedures  (including critical care time)  Medical Decision Making / ED Course   MDM:  76 year old female presenting to the emergency department shortness of breath.  Patient appears mildly ill but in no acute distress.  Pulmonary exam with bibasilar crackles worse on the right side as well as bilateral lower extremity edema.  Given pulmonary findings and symptoms, some concern for possible pneumonia as well as possible CHF exacerbation.  Chest x-ray without obvious infiltrate but does have pulmonary edema on my interpretation.  Possibly worse on the right lower lobe which could represent a pneumonia.  Patient frequently coughing in the room.  Also obtain COVID swab.  Will obtain labs including CBC, BNP.  Will give Lasix as patient appears volume overloaded.  Patient not hypoxic on room air but endorses significant fatigue and inability to complete ADLs at home to the point where she is not taking her Lasix because she is too weak to get up and urinate.  Anticipate patient will likely need to be admitted.  Lower concern for ACS  without chest pain.  Chest x-ray without evidence of pneumothorax.  No wheezing to suggest COPD exacerbation.  Low concern for PE as patient is on chronic warfarin therapy, no chest pain.    Clinical Course as of 12/26/21 1417  Sun Dec 26, 2021  1416 Discussed with Dr. Trilby Drummer who has admitted the patient for CHF exacerbation.  [WS]    Clinical Course User Index [WS] Cristie Hem, MD     Additional history obtained: -Additional history obtained from ems -External records from outside source obtained and reviewed including: Chart review including previous notes, labs, imaging, consultation notes including anti coagulation visit 12/17/21   Lab Tests: -I ordered, reviewed, and interpreted labs.   The pertinent results include:   Labs Reviewed  BASIC METABOLIC PANEL - Abnormal; Notable for the following components:      Result Value   Sodium 129 (*)    Chloride 86 (*)    Glucose, Bld 138 (*)    Calcium 8.6 (*)    Anion gap 16 (*)    All other components within normal limits  CBC - Abnormal; Notable for the following components:   Platelets 415 (*)    All other components within normal limits  BRAIN NATRIURETIC PEPTIDE - Abnormal; Notable for the following components:   B Natriuretic Peptide 377.9 (*)  All other components within normal limits  HEPATIC FUNCTION PANEL - Abnormal; Notable for the following components:   Total Protein 5.8 (*)    Albumin 2.2 (*)    AST 44 (*)    Bilirubin, Direct 0.3 (*)    All other components within normal limits  I-STAT VENOUS BLOOD GAS, ED - Abnormal; Notable for the following components:   pO2, Ven 71 (*)    Bicarbonate 29.7 (*)    Acid-Base Excess 4.0 (*)    Sodium 126 (*)    Potassium 3.4 (*)    Calcium, Ion 1.07 (*)    Hemoglobin 15.3 (*)    All other components within normal limits  RESP PANEL BY RT-PCR (FLU A&B, COVID) ARPGX2  CULTURE, BLOOD (ROUTINE X 2)  CULTURE, BLOOD (ROUTINE X 2)  RESPIRATORY PANEL BY PCR  LACTIC  ACID, PLASMA  LACTIC ACID, PLASMA  URINALYSIS, ROUTINE W REFLEX MICROSCOPIC  PROCALCITONIN  PROTIME-INR  MAGNESIUM    Notable for elevated BNP. No leukocytosis  EKG   EKG Interpretation  Date/Time:  Sunday December 26 2021 11:09:16 EST Ventricular Rate:  93 PR Interval:    QRS Duration: 95 QT Interval:  350 QTC Calculation: 436 R Axis:   117 Text Interpretation: Atrial fibrillation Ventricular premature complex Right axis deviation Low voltage, precordial leads Confirmed by Garnette Gunner [id number]) on 12/26/2021 11:50:45 AM         Imaging Studies ordered: I ordered imaging studies including CXR On my interpretation imaging demonstrates no pneumonia I independently visualized and interpreted imaging. I agree with the radiologist interpretation   Medicines ordered and prescription drug management: Meds ordered this encounter  Medications   furosemide (LASIX) injection 40 mg   sotalol (BETAPACE) tablet 120 mg   metoprolol succinate (TOPROL-XL) 24 hr tablet 25 mg   isosorbide mononitrate (IMDUR) 24 hr tablet 30 mg    TAKE 1 TABLET BY MOUTH EVERY DAY     levothyroxine (SYNTHROID) tablet 50 mcg   albuterol (ACCUNEB) nebulizer solution 3 mL   furosemide (LASIX) injection 60 mg   sodium chloride flush (NS) 0.9 % injection 3 mL   OR Linked Order Group    acetaminophen (TYLENOL) tablet 650 mg    acetaminophen (TYLENOL) suppository 650 mg   polyethylene glycol (MIRALAX / GLYCOLAX) packet 17 g    -I have reviewed the patients home medicines and have made adjustments as needed   Consultations Obtained: I requested consultation with the hospitalist,  and discussed lab and imaging findings as well as pertinent plan - they recommend: admission   Cardiac Monitoring: The patient was maintained on a cardiac monitor.  I personally viewed and interpreted the cardiac monitored which showed an underlying rhythm of: afib  Social Determinants of Health:  Diagnosis or treatment  significantly limited by social determinants of health: obesity and lives alone   Reevaluation: After the interventions noted above, I reevaluated the patient and found that they have improved  Co morbidities that complicate the patient evaluation  Past Medical History:  Diagnosis Date   CAD (coronary artery disease)    a. LHC 3/17: mLAD 80, D1 50, pRCA 30, EF 25-35%; PCI: 3.5 x 16 mm Synergy DES to mLAD // Myoview 08/2018: EF 54, normal perfusion; Low Risk   Chronic diastolic CHF 6/37/8588   Echocardiogram 08/2018: EF 55-60, mild septal basal hypertrophy, Gr 2 DD, elevated LVEDP, mild LAE, mild AI   Dilated cardiomyopathy >> EF returned to normal in NSR    Probable Tachycardia Mediated //  a. Echo 12/16: Mild concentric LVH, EF 25-30%, anteroseptal akinesis, inferoseptal akinesis, anterior akinesis, trivial AI, mild MR, moderate LAE, trivial TR, trivial PI, PASP 33 mmHg  //  b. Echo 2/17: Mild LVH, EF 25-30%, diffuse HK, mild LAE  //  c. Echo 7/17: mild LVH, EF 50-55%, no RWMA, trivial AI, mild to mod LAE, mild RAE   Febrile seizure ([place]) 2004   "during TTP thing; cause my temp was 106"   High cholesterol    History of blood transfusion 2004   "several; related to TTP"   Hypertension    Hypothyroidism    Morbid obesity ([place]) 01/07/2013   Osteoarthritis of back    Paroxysmal atrial fibrillation ([place]) 08/01/2011   a. 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    Final Clinical Impression(s) / ED Diagnoses Final diagnoses:  Acute on chronic congestive heart failure, unspecified heart failure type [facility])     This chart was dictated using voice recognition software.   Despite best efforts to proofread,  errors can occur which can change the documentation meaning.    Cristie Hem, MD 12/26/21 [phone]

## 2021-12-27 ENCOUNTER — Other Ambulatory Visit (HOSPITAL_COMMUNITY): Payer: Self-pay

## 2021-12-27 ENCOUNTER — Observation Stay (HOSPITAL_BASED_OUTPATIENT_CLINIC_OR_DEPARTMENT_OTHER): Payer: Medicare Other

## 2021-12-27 DIAGNOSIS — Z8249 Family history of ischemic heart disease and other diseases of the circulatory system: Secondary | ICD-10-CM | POA: Diagnosis not present

## 2021-12-27 DIAGNOSIS — Z20822 Contact with and (suspected) exposure to covid-19: Secondary | ICD-10-CM | POA: Diagnosis present

## 2021-12-27 DIAGNOSIS — Z7901 Long term (current) use of anticoagulants: Secondary | ICD-10-CM | POA: Diagnosis not present

## 2021-12-27 DIAGNOSIS — F419 Anxiety disorder, unspecified: Secondary | ICD-10-CM | POA: Diagnosis present

## 2021-12-27 DIAGNOSIS — I5033 Acute on chronic diastolic (congestive) heart failure: Secondary | ICD-10-CM | POA: Diagnosis present

## 2021-12-27 DIAGNOSIS — R0609 Other forms of dyspnea: Secondary | ICD-10-CM | POA: Diagnosis not present

## 2021-12-27 DIAGNOSIS — Z85828 Personal history of other malignant neoplasm of skin: Secondary | ICD-10-CM | POA: Diagnosis not present

## 2021-12-27 DIAGNOSIS — Z881 Allergy status to other antibiotic agents status: Secondary | ICD-10-CM | POA: Diagnosis not present

## 2021-12-27 DIAGNOSIS — I4819 Other persistent atrial fibrillation: Secondary | ICD-10-CM | POA: Diagnosis present

## 2021-12-27 DIAGNOSIS — Z6841 Body Mass Index (BMI) 40.0 and over, adult: Secondary | ICD-10-CM | POA: Diagnosis not present

## 2021-12-27 DIAGNOSIS — D849 Immunodeficiency, unspecified: Secondary | ICD-10-CM | POA: Diagnosis present

## 2021-12-27 DIAGNOSIS — Z79890 Hormone replacement therapy (postmenopausal): Secondary | ICD-10-CM | POA: Diagnosis not present

## 2021-12-27 DIAGNOSIS — J9621 Acute and chronic respiratory failure with hypoxia: Secondary | ICD-10-CM | POA: Diagnosis not present

## 2021-12-27 DIAGNOSIS — Z79899 Other long term (current) drug therapy: Secondary | ICD-10-CM | POA: Diagnosis not present

## 2021-12-27 DIAGNOSIS — I420 Dilated cardiomyopathy: Secondary | ICD-10-CM | POA: Diagnosis present

## 2021-12-27 DIAGNOSIS — Z809 Family history of malignant neoplasm, unspecified: Secondary | ICD-10-CM | POA: Diagnosis not present

## 2021-12-27 DIAGNOSIS — I10 Essential (primary) hypertension: Secondary | ICD-10-CM | POA: Diagnosis not present

## 2021-12-27 DIAGNOSIS — I2510 Atherosclerotic heart disease of native coronary artery without angina pectoris: Secondary | ICD-10-CM | POA: Diagnosis present

## 2021-12-27 DIAGNOSIS — E7800 Pure hypercholesterolemia, unspecified: Secondary | ICD-10-CM | POA: Diagnosis present

## 2021-12-27 DIAGNOSIS — E871 Hypo-osmolality and hyponatremia: Secondary | ICD-10-CM | POA: Diagnosis present

## 2021-12-27 DIAGNOSIS — I110 Hypertensive heart disease with heart failure: Secondary | ICD-10-CM | POA: Diagnosis present

## 2021-12-27 DIAGNOSIS — E039 Hypothyroidism, unspecified: Secondary | ICD-10-CM | POA: Diagnosis present

## 2021-12-27 DIAGNOSIS — Z9081 Acquired absence of spleen: Secondary | ICD-10-CM | POA: Diagnosis not present

## 2021-12-27 DIAGNOSIS — J189 Pneumonia, unspecified organism: Secondary | ICD-10-CM | POA: Diagnosis present

## 2021-12-27 DIAGNOSIS — Z66 Do not resuscitate: Secondary | ICD-10-CM | POA: Diagnosis not present

## 2021-12-27 LAB — CBC
HCT: 38.9 % (ref 36.0–46.0)
Hemoglobin: 13.3 g/dL (ref 12.0–15.0)
MCH: 31 pg (ref 26.0–34.0)
MCHC: 34.2 g/dL (ref 30.0–36.0)
MCV: 90.7 fL (ref 80.0–100.0)
Platelets: 426 10*3/uL — ABNORMAL HIGH (ref 150–400)
RBC: 4.29 MIL/uL (ref 3.87–5.11)
RDW: 15.1 % (ref 11.5–15.5)
WBC: 11.2 10*3/uL — ABNORMAL HIGH (ref 4.0–10.5)
nRBC: 0 % (ref 0.0–0.2)

## 2021-12-27 LAB — BLOOD GAS, ARTERIAL
Acid-Base Excess: 8.5 mmol/L — ABNORMAL HIGH (ref 0.0–2.0)
Bicarbonate: 35.3 mmol/L — ABNORMAL HIGH (ref 20.0–28.0)
Drawn by: 36527
O2 Saturation: 95.1 %
Patient temperature: 37
pCO2 arterial: 57 mmHg — ABNORMAL HIGH (ref 32–48)
pH, Arterial: 7.4 (ref 7.35–7.45)
pO2, Arterial: 65 mmHg — ABNORMAL LOW (ref 83–108)

## 2021-12-27 LAB — RESPIRATORY PANEL BY PCR

## 2021-12-27 LAB — COMPREHENSIVE METABOLIC PANEL
ALT: 31 U/L (ref 0–44)
AST: 37 U/L (ref 15–41)
Albumin: 2.4 g/dL — ABNORMAL LOW (ref 3.5–5.0)
Alkaline Phosphatase: 74 U/L (ref 38–126)
Anion gap: 12 (ref 5–15)
BUN: 12 mg/dL (ref 8–23)
CO2: 28 mmol/L (ref 22–32)
Calcium: 8.5 mg/dL — ABNORMAL LOW (ref 8.9–10.3)
Chloride: 89 mmol/L — ABNORMAL LOW (ref 98–111)
Creatinine, Ser: 1.07 mg/dL — ABNORMAL HIGH (ref 0.44–1.00)
GFR, Estimated: 54 mL/min — ABNORMAL LOW (ref >60)
Glucose, Bld: 136 mg/dL — ABNORMAL HIGH (ref 70–99)
Potassium: 3.6 mmol/L (ref 3.5–5.1)
Sodium: 129 mmol/L — ABNORMAL LOW (ref 135–145)
Total Bilirubin: 0.9 mg/dL (ref 0.3–1.2)
Total Protein: 6.3 g/dL — ABNORMAL LOW (ref 6.5–8.1)

## 2021-12-27 LAB — ECHOCARDIOGRAM COMPLETE
Height: 64 in
S' Lateral: 3.1 cm
Weight: 4222.25 oz

## 2021-12-27 LAB — PROTIME-INR
INR: 4.1 (ref 0.8–1.2)
Prothrombin Time: 39.5 seconds — ABNORMAL HIGH (ref 11.4–15.2)

## 2021-12-27 LAB — GLUCOSE, CAPILLARY: Glucose-Capillary: 120 mg/dL — ABNORMAL HIGH (ref 70–99)

## 2021-12-27 LAB — AMMONIA: Ammonia: 28 umol/L (ref 9–35)

## 2021-12-27 MED ORDER — IPRATROPIUM-ALBUTEROL 0.5-2.5 (3) MG/3ML IN SOLN
3.0000 mL | Freq: Three times a day (TID) | RESPIRATORY_TRACT | Status: DC
  Administered 2021-12-27 – 2021-12-28 (×3): 3 mL via RESPIRATORY_TRACT
  Filled 2021-12-27 (×3): qty 3

## 2021-12-27 MED ORDER — GUAIFENESIN ER 600 MG PO TB12
600.0000 mg | ORAL_TABLET | Freq: Two times a day (BID) | ORAL | Status: DC
  Administered 2021-12-27 – 2022-01-03 (×15): 600 mg via ORAL
  Filled 2021-12-27 (×15): qty 1

## 2021-12-27 MED ORDER — SPIRONOLACTONE 25 MG PO TABS
25.0000 mg | ORAL_TABLET | Freq: Every day | ORAL | Status: DC
  Administered 2021-12-27 – 2022-01-03 (×8): 25 mg via ORAL
  Filled 2021-12-27 (×8): qty 1

## 2021-12-27 MED ORDER — POTASSIUM CHLORIDE CRYS ER 10 MEQ PO TBCR
40.0000 meq | EXTENDED_RELEASE_TABLET | Freq: Once | ORAL | Status: AC
  Administered 2021-12-27: 40 meq via ORAL
  Filled 2021-12-27: qty 4

## 2021-12-27 MED ORDER — METOPROLOL SUCCINATE ER 25 MG PO TB24
25.0000 mg | ORAL_TABLET | Freq: Every day | ORAL | Status: DC
  Administered 2021-12-27 – 2022-01-03 (×8): 25 mg via ORAL
  Filled 2021-12-27 (×8): qty 1

## 2021-12-27 MED ORDER — IPRATROPIUM-ALBUTEROL 0.5-2.5 (3) MG/3ML IN SOLN
3.0000 mL | Freq: Four times a day (QID) | RESPIRATORY_TRACT | Status: DC
  Administered 2021-12-27: 3 mL via RESPIRATORY_TRACT
  Filled 2021-12-27: qty 3

## 2021-12-27 NOTE — Care Management (Signed)
  Transition of Care [facility]) Screening Note   Patient Details  Name: [name] Date of Birth: 03/10/1945   Transition of Care [facility]) CM/SW Contact:    Bethena Roys, RN Phone Number: 12/27/2021, 3:26 PM    Transition of Care Department [facility]) has reviewed the patient and no TOC needs have been identified at this time. Patient continues on IV Lasix. Case Manager will continue to monitor patient advancement through interdisciplinary progression rounds. If new patient transition needs arise, please place a TOC consult.

## 2021-12-27 NOTE — TOC Benefit Eligibility Note (Signed)
Patient Teacher, English as a foreign language completed.    The patient is currently admitted and upon discharge could be taking [place].  The current 30 day co-pay is $47.00.    Patient Teacher, English as a foreign language completed.    The patient is currently admitted and upon discharge could be taking Jardiance.  The current 30 day co-pay is $47.00.   The patient is insured through [facility] Part D

## 2021-12-27 NOTE — Progress Notes (Signed)
Respiratory panel sent upon arrival to unit is NEGATIVE. Provider on call paged to discontinue Droplet precautions. Jessie Foot, RN

## 2021-12-27 NOTE — Progress Notes (Signed)
  Echocardiogram 2D Echocardiogram has been performed.  [name] 12/27/2021, 9:11 AM

## 2021-12-27 NOTE — TOC Benefit Eligibility Note (Signed)
Patient Teacher, English as a foreign language completed.    The patient is currently admitted and upon discharge could be taking Eliquis.  The current 30 day co-pay is $47.00.   The patient is insured through [facility] Part D

## 2021-12-27 NOTE — Progress Notes (Signed)
Critical INR of 4.1 communicated to Broadus John, MD in person.

## 2021-12-27 NOTE — Progress Notes (Signed)
[place] for warfarin  Indication: atrial fibrillation  Allergies  Allergen Reactions   Ceftriaxone Rash and Other (See Comments)    "TTP"/Was hospitalized and in a coma for 60 days, per patient (Thrombotic Thrombocytopenic Purpura)   Penicillins Hives, Rash and Other (See Comments)    Cannot tolerate any "-CILLINS"; causes welts!! Has patient had a PCN reaction causing immediate rash, facial/tongue/throat swelling, SOB or lightheadedness with hypotension: Yes Has patient had a PCN reaction causing severe rash involving mucus membranes or skin necrosis: No Has patient had a PCN reaction that required hospitalization No Has patient had a PCN reaction occurring within the last 10 years: No If all of the above answers are "NO", then may proceed with Cephalosporin use.   Levofloxacin Other (See Comments)    Thrombocytopenia     Levofloxacin Other (See Comments)    Dropped blood platelets extremely low   Oxycodone Itching   Tape Other (See Comments)    Prefers paper or cloth tape Other reaction(s): Other (See Comments) Prefers paper or cloth tape Other reaction(s): Other (See Comments) Prefers paper or cloth tape   Alendronate Rash   Aspirin Other (See Comments)    Patient stated she is unable to take due to blood thinner    Oxycodone-Acetaminophen Itching   Penicillin G Rash    Patient Measurements: Height: '5\' 4"'$  (162.6 cm) Weight: 119.7 kg (263 lb 14.3 oz) IBW/kg (Calculated) : 54.7 Heparin Dosing Weight: 83.2 kg  Vital Signs: Temp: 97.9 F (36.6 C) (11/27 1411) Temp Source: Oral (11/27 1411) BP: 115/73 (11/27 1411) Pulse Rate: 86 (11/27 1411)  Labs: Recent Labs    12/26/21 1203 12/26/21 1245 12/26/21 1718 12/27/21 0631  HGB 12.8 15.3*  --  13.3  HCT 37.8 45.0  --  38.9  PLT 415*  --   --  426*  LABPROT  --   --  41.9* 39.5*  INR  --   --  4.4* 4.1*  CREATININE 0.85  --   --  1.07*     Estimated Creatinine Clearance:  57 mL/min (A) (by C-G formula based on SCr of 1.07 mg/dL (H)).   Medical History: Past Medical History:  Diagnosis Date   CAD (coronary artery disease)    a. LHC 3/17: mLAD 80, D1 50, pRCA 30, EF 25-35%; PCI: 3.5 x 16 mm Synergy DES to mLAD // Myoview 08/2018: EF 54, normal perfusion; Low Risk   Chronic diastolic CHF 3/71/0626   Echocardiogram 08/2018: EF 55-60, mild septal basal hypertrophy, Gr 2 DD, elevated LVEDP, mild LAE, mild AI   Dilated cardiomyopathy >> EF returned to normal in NSR    Probable Tachycardia Mediated // a. Echo 12/16: Mild concentric LVH, EF 25-30%, anteroseptal akinesis, inferoseptal akinesis, anterior akinesis, trivial AI, mild MR, moderate LAE, trivial TR, trivial PI, PASP 33 mmHg  //  b. Echo 2/17: Mild LVH, EF 25-30%, diffuse HK, mild LAE  //  c. Echo 7/17: mild LVH, EF 50-55%, no RWMA, trivial AI, mild to mod LAE, mild RAE   Febrile seizure ([place]) 2004   "during TTP thing; cause my temp was 106"   High cholesterol    History of blood transfusion 2004   "several; related to TTP"   Hypertension    Hypothyroidism    Morbid obesity ([place]) 01/07/2013   Osteoarthritis of back    Paroxysmal atrial fibrillation ([place]) 08/01/2011   a. 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 Not on DOAC d/t cost issue. Pharmacy consulted to dose warfarin while inpatient. Patient took last dose of warfarin 11/26 @ 0900.   INR still elevated at 4.1. Hemoglobin down from 15.3 to 13.3 today. Platelet count within normal limits. No bleeding issues noted.   PTA regimen: warfarin '5mg'$  QD except 7.'5mg'$  on Tuesdays   Goal of Therapy:  INR 2-3 Monitor platelets by anticoagulation protocol: Yes   Plan:  No warfarin today Daily INR Daily CBC F/u s/sx  bleeding   Erin Hearing PharmD., BCPS Clinical Pharmacist 12/27/2021 2:14 PM

## 2021-12-27 NOTE — Progress Notes (Signed)
TRH night cross cover note:   I was notified by RN that the patient's respiratory viral panel has returned negative.  This was preceded by negative COVID-19/influenza PCR.  I subsequently discontinued existing order for droplet precautions.     Babs Bertin, DO Hospitalist

## 2021-12-27 NOTE — Progress Notes (Addendum)
PROGRESS NOTE    [name]  NID:[id number] DOB: Apr 30, 1945 DOA: 12/26/2021 PCP: Elisabeth Cara, PA-C  76/F w/ chronic diastolic CHF, TTP, hypertension, obesity, anxiety, atrial fibrillation, hyperlipidemia, low back pain, CAD, hypothyroidism, mycoplasma pneumonia, splenectomy presenting with shortness of breath. -1 week of symptoms that include fever, cough productive of green sputum, fatigue.  fever resolved, taking only half of her prescribed dose of Lasix because of fatigue. In ED, creat normal, albumin 2.8  BNP elevated to 377. flu and COVID-negative.  Procalcitonin and blood cultures pending. Chest x-ray showing chronic interstitial changes with cardiomegaly and possible interstitial edema.  Subjective: Starting to feel better, coughing up thick yellow phlegm, breathing not back to baseline  Assessment and Plan:  Acute on chronic diastolic CHF RV failure -Last echo 3/23 w/ EF 55-60%, indeterminate diastolic function and moderately reduced RV function. -recent URI, cut down lasix -Appears volume overloaded,  - Continue with Lasix 60 mg IV twice daily, add Aldactone - Repeat echo ordered on admission, will follow-up - Continue home metoprolol, Imdur -Needs sleep study as outpatient   Bronchitis, suspected viral URI -Patient has had a week of fevers cough productive of green sputum and fatigue.  No evidence of pneumonia on chest x-ray.  Negative for flu and COVID, respiratory virus was negative -Add DuoNebs, Mucinex, supportive care   Hypertension - Continue home Imdur, metoprolol - On Lasix as above   Persistent Atrial fibrillation - Continue home metoprolol and sotalol - Continue home warfarin with pharmacy consult  History of CAD -DES to LAD in 2017 -Continue Toprol, warfarin   Hypothyroidism - Continue home Synthroid   Obesity  History of TTP History of splenectomy - platelet count normal  DVT prophylaxis: warfarin Code Status: Full Code Family  Communication: Discussed with patient detail, no family at bedside Disposition Plan: Home likely 2 to 3 days  Consultants:    Procedures:   Antimicrobials:    Objective: Vitals:   12/26/21 1808 12/26/21 1900 12/26/21 2220 12/27/21 0048  BP: (!) 130/98 113/74 137/89 (!) 125/99  Pulse: (!) 106 100 98 82  Resp: (!) 27 (!) '27 18 18  '$ Temp:   98.8 F (37.1 C) 97.7 F (36.5 C)  TempSrc:   Oral Oral  SpO2: 94% 94% 96% 91%  Weight:   118.6 kg   Height:        Intake/Output Summary (Last 24 hours) at 12/27/2021 0524 Last data filed at 12/26/2021 1933 Gross per 24 hour  Intake 247.37 ml  Output --  Net 247.37 ml   Filed Weights   12/26/21 1113 12/26/21 2220  Weight: 117.9 kg 118.6 kg    Examination:  General exam: Obese female sitting up in bed, AAOx3, no distress HEENT: Positive JVD CVS: S1-S2, irregular Lungs: Scattered rhonchi Abdomen: Soft, obese, nontender, bowel sounds present Extremities: 1+ edema  Skin: No rashes Psychiatry:  Mood & affect appropriate.     Data Reviewed:   CBC: Recent Labs  Lab 12/26/21 1203 12/26/21 1245  WBC 9.3  --   HGB 12.8 15.3*  HCT 37.8 45.0  MCV 90.2  --   PLT 415*  --    Basic Metabolic Panel: Recent Labs  Lab 12/26/21 1203 12/26/21 1245 12/26/21 1718  NA 129* 126*  --   K 3.5 3.4*  --   CL 86*  --   --   CO2 27  --   --   GLUCOSE 138*  --   --   BUN 10  --   --  CREATININE 0.85  --   --   CALCIUM 8.6*  --   --   MG  --   --  1.7   GFR: Estimated Creatinine Clearance: 71.4 mL/min (by C-G formula based on SCr of 0.85 mg/dL). Liver Function Tests: Recent Labs  Lab 12/26/21 1203  AST 44*  ALT 31  ALKPHOS 69  BILITOT 0.9  PROT 5.8*  ALBUMIN 2.2*   No results for input(s): "LIPASE", "AMYLASE" in the last 168 hours. No results for input(s): "AMMONIA" in the last 168 hours. Coagulation Profile: Recent Labs  Lab 12/26/21 1718  INR 4.4*   Cardiac Enzymes: No results for input(s): "CKTOTAL", "CKMB",  "CKMBINDEX", "TROPONINI" in the last 168 hours. BNP (last 3 results) No results for input(s): "PROBNP" in the last 8760 hours. HbA1C: No results for input(s): "HGBA1C" in the last 72 hours. CBG: No results for input(s): "GLUCAP" in the last 168 hours. Lipid Profile: No results for input(s): "CHOL", "HDL", "LDLCALC", "TRIG", "CHOLHDL", "LDLDIRECT" in the last 72 hours. Thyroid Function Tests: No results for input(s): "TSH", "T4TOTAL", "FREET4", "T3FREE", "THYROIDAB" in the last 72 hours. Anemia Panel: No results for input(s): "VITAMINB12", "FOLATE", "FERRITIN", "TIBC", "IRON", "RETICCTPCT" in the last 72 hours. Urine analysis:    Component Value Date/Time   COLORURINE STRAW (A) 12/26/2021 1150   APPEARANCEUR CLEAR 12/26/2021 1150   LABSPEC 1.002 (L) 12/26/2021 1150   PHURINE 5.0 12/26/2021 1150   GLUCOSEU NEGATIVE 12/26/2021 1150   HGBUR MODERATE (A) 12/26/2021 1150   BILIRUBINUR NEGATIVE 12/26/2021 1150   KETONESUR NEGATIVE 12/26/2021 1150   PROTEINUR NEGATIVE 12/26/2021 1150   UROBILINOGEN 0.2 10/17/2008 0024   NITRITE NEGATIVE 12/26/2021 1150   LEUKOCYTESUR LARGE (A) 12/26/2021 1150   Sepsis Labs: '@LABRCNTIP'$ (procalcitonin:4,lacticidven:4)  ) Recent Results (from the past 240 hour(s))  Resp Panel by RT-PCR (Flu A&B, Covid) Anterior Nasal Swab     Status: None   Collection Time: 12/26/21 12:21 PM   Specimen: Anterior Nasal Swab  Result Value Ref Range Status   SARS Coronavirus 2 by RT PCR NEGATIVE NEGATIVE Final    Comment: (NOTE) SARS-CoV-2 target nucleic acids are NOT DETECTED.  The SARS-CoV-2 RNA is generally detectable in upper respiratory specimens during the acute phase of infection. The lowest concentration of SARS-CoV-2 viral copies this assay can detect is 138 copies/mL. A negative result does not preclude SARS-Cov-2 infection and should not be used as the sole basis for treatment or other patient management decisions. A negative result may occur with   improper specimen collection/handling, submission of specimen other than nasopharyngeal swab, presence of viral mutation(s) within the areas targeted by this assay, and inadequate number of viral copies(<138 copies/mL). A negative result must be combined with clinical observations, patient history, and epidemiological information. The expected result is Negative.  Fact Sheet for Patients:  EntrepreneurPulse.com.au  Fact Sheet for Healthcare Providers:  IncredibleEmployment.be  This test is no t yet approved or cleared by the [place] FDA and  has been authorized for detection and/or diagnosis of SARS-CoV-2 by FDA under an Emergency Use Authorization (EUA). This EUA will remain  in effect (meaning this test can be used) for the duration of the COVID-19 declaration under Section 564(b)(1) of the Act, 21 U.S.C.section 360bbb-3(b)(1), unless the authorization is terminated  or revoked sooner.       Influenza A by PCR NEGATIVE NEGATIVE Final   Influenza B by PCR NEGATIVE NEGATIVE Final    Comment: (NOTE) The Xpert Xpress SARS-CoV-2/FLU/RSV plus assay is intended as an  aid in the diagnosis of influenza from Nasopharyngeal swab specimens and should not be used as a sole basis for treatment. Nasal washings and aspirates are unacceptable for Xpert Xpress SARS-CoV-2/FLU/RSV testing.  Fact Sheet for Patients: EntrepreneurPulse.com.au  Fact Sheet for Healthcare Providers: IncredibleEmployment.be  This test is not yet approved or cleared by the [place] FDA and has been authorized for detection and/or diagnosis of SARS-CoV-2 by FDA under an Emergency Use Authorization (EUA). This EUA will remain in effect (meaning this test can be used) for the duration of the COVID-19 declaration under Section 564(b)(1) of the Act, 21 U.S.C. section 360bbb-3(b)(1), unless the authorization is terminated  or revoked.  Performed at [facility], [place] [address] [place], [place] [address]   Respiratory (~20 pathogens) panel by PCR     Status: None   Collection Time: 12/26/21 10:55 PM   Specimen: Nasopharyngeal Swab; Respiratory  Result Value Ref Range Status   Adenovirus NOT DETECTED NOT DETECTED Final   Coronavirus 229E NOT DETECTED NOT DETECTED Final    Comment: (NOTE) The Coronavirus on the Respiratory Panel, DOES NOT test for the novel  Coronavirus (2019 nCoV)    Coronavirus HKU1 NOT DETECTED NOT DETECTED Final   Coronavirus NL63 NOT DETECTED NOT DETECTED Final   Coronavirus OC43 NOT DETECTED NOT DETECTED Final   Metapneumovirus NOT DETECTED NOT DETECTED Final   Rhinovirus / Enterovirus NOT DETECTED NOT DETECTED Final   Influenza A NOT DETECTED NOT DETECTED Final   Influenza B NOT DETECTED NOT DETECTED Final   Parainfluenza Virus 1 NOT DETECTED NOT DETECTED Final   Parainfluenza Virus 2 NOT DETECTED NOT DETECTED Final   Parainfluenza Virus 3 NOT DETECTED NOT DETECTED Final   Parainfluenza Virus 4 NOT DETECTED NOT DETECTED Final   Respiratory Syncytial Virus NOT DETECTED NOT DETECTED Final   Bordetella pertussis NOT DETECTED NOT DETECTED Final   Bordetella Parapertussis NOT DETECTED NOT DETECTED Final   Chlamydophila pneumoniae NOT DETECTED NOT DETECTED Final   Mycoplasma pneumoniae NOT DETECTED NOT DETECTED Final    Comment: Performed at [facility], [place]. [address], [place], [place] [address]     Radiology Studies: DG CHEST PORT 1 VIEW  Result Date: 12/26/2021 CLINICAL DATA:  Shortness of breath EXAM: PORTABLE CHEST 1 VIEW COMPARISON:  03/30/2021 FINDINGS: 1135 hours. The cardio pericardial silhouette is enlarged. Interstitial markings are diffusely coarsened with chronic features. The lungs are clear without focal pneumonia, edema, pneumothorax or pleural effusion. Bones are diffusely demineralized. Telemetry leads overlie the chest. IMPRESSION: Chronic  interstitial coarsening with cardiomegaly. Component of mild interstitial edema not excluded. Electronically Signed   By: Misty Stanley M.D.   On: 12/26/2021 11:54     Scheduled Meds:  furosemide  60 mg Intravenous BID   isosorbide mononitrate  30 mg Oral Daily   levothyroxine  50 mcg Oral Q0600   metoprolol succinate  25 mg Oral Daily   potassium chloride  40 mEq Oral Once   sodium chloride flush  3 mL Intravenous Q12H   sotalol  120 mg Oral BID   Warfarin - Pharmacist Dosing Inpatient   Does not apply q1600   Continuous Infusions:   LOS: 0 days    Time spent: 38mn    PDomenic Polite MD [facility]   12/27/2021, 5:24 AM

## 2021-12-28 ENCOUNTER — Inpatient Hospital Stay (HOSPITAL_COMMUNITY): Payer: Medicare Other

## 2021-12-28 DIAGNOSIS — I5033 Acute on chronic diastolic (congestive) heart failure: Secondary | ICD-10-CM | POA: Diagnosis not present

## 2021-12-28 LAB — CBC
HCT: 40.6 % (ref 36.0–46.0)
Hemoglobin: 13.1 g/dL (ref 12.0–15.0)
MCH: 29.9 pg (ref 26.0–34.0)
MCHC: 32.3 g/dL (ref 30.0–36.0)
MCV: 92.7 fL (ref 80.0–100.0)
Platelets: 414 10*3/uL — ABNORMAL HIGH (ref 150–400)
RBC: 4.38 MIL/uL (ref 3.87–5.11)
RDW: 15.3 % (ref 11.5–15.5)
WBC: 10.1 10*3/uL (ref 4.0–10.5)
nRBC: 0 % (ref 0.0–0.2)

## 2021-12-28 LAB — URINALYSIS, ROUTINE W REFLEX MICROSCOPIC
Bilirubin Urine: NEGATIVE
Glucose, UA: NEGATIVE mg/dL
Ketones, ur: NEGATIVE mg/dL
Nitrite: NEGATIVE
Protein, ur: NEGATIVE mg/dL
Specific Gravity, Urine: 1.015 (ref 1.005–1.030)
pH: 6.5 (ref 5.0–8.0)

## 2021-12-28 LAB — COMPREHENSIVE METABOLIC PANEL
ALT: 31 U/L (ref 0–44)
AST: 32 U/L (ref 15–41)
Albumin: 2.2 g/dL — ABNORMAL LOW (ref 3.5–5.0)
Alkaline Phosphatase: 75 U/L (ref 38–126)
Anion gap: 12 (ref 5–15)
BUN: 14 mg/dL (ref 8–23)
CO2: 32 mmol/L (ref 22–32)
Calcium: 8.8 mg/dL — ABNORMAL LOW (ref 8.9–10.3)
Chloride: 89 mmol/L — ABNORMAL LOW (ref 98–111)
Creatinine, Ser: 0.86 mg/dL (ref 0.44–1.00)
GFR, Estimated: >60 mL/min (ref >60)
Glucose, Bld: 120 mg/dL — ABNORMAL HIGH (ref 70–99)
Potassium: 4 mmol/L (ref 3.5–5.1)
Sodium: 133 mmol/L — ABNORMAL LOW (ref 135–145)
Total Bilirubin: 0.5 mg/dL (ref 0.3–1.2)
Total Protein: 6.1 g/dL — ABNORMAL LOW (ref 6.5–8.1)

## 2021-12-28 LAB — URINALYSIS, MICROSCOPIC (REFLEX)

## 2021-12-28 LAB — PROCALCITONIN: Procalcitonin: <0.1 ng/mL

## 2021-12-28 LAB — PROTIME-INR
INR: 3.2 — ABNORMAL HIGH (ref 0.8–1.2)
Prothrombin Time: 32.8 seconds — ABNORMAL HIGH (ref 11.4–15.2)

## 2021-12-28 LAB — MAGNESIUM: Magnesium: 2.1 mg/dL (ref 1.7–2.4)

## 2021-12-28 MED ORDER — GUAIFENESIN-DM 100-10 MG/5ML PO SYRP
5.0000 mL | ORAL_SOLUTION | ORAL | Status: DC | PRN
  Administered 2021-12-28 – 2021-12-30 (×5): 5 mL via ORAL
  Filled 2021-12-28 (×5): qty 5

## 2021-12-28 MED ORDER — WARFARIN SODIUM 2 MG PO TABS
4.0000 mg | ORAL_TABLET | Freq: Once | ORAL | Status: AC
  Administered 2021-12-28: 4 mg via ORAL
  Filled 2021-12-28: qty 2

## 2021-12-28 MED ORDER — FUROSEMIDE 10 MG/ML IJ SOLN
INTRAMUSCULAR | Status: AC
  Administered 2021-12-28: 40 mg
  Filled 2021-12-28: qty 4

## 2021-12-28 MED ORDER — BENZONATATE 100 MG PO CAPS
100.0000 mg | ORAL_CAPSULE | Freq: Three times a day (TID) | ORAL | Status: AC
  Administered 2021-12-28 – 2021-12-29 (×6): 100 mg via ORAL
  Filled 2021-12-28 (×6): qty 1

## 2021-12-28 MED ORDER — SODIUM CHLORIDE 0.9 % IV SOLN
2.0000 g | Freq: Three times a day (TID) | INTRAVENOUS | Status: DC
  Administered 2021-12-28 – 2021-12-30 (×7): 2 g via INTRAVENOUS
  Filled 2021-12-28 (×8): qty 10

## 2021-12-28 MED ORDER — IPRATROPIUM-ALBUTEROL 0.5-2.5 (3) MG/3ML IN SOLN
3.0000 mL | Freq: Four times a day (QID) | RESPIRATORY_TRACT | Status: DC
  Administered 2021-12-28 – 2021-12-31 (×10): 3 mL via RESPIRATORY_TRACT
  Filled 2021-12-28 (×10): qty 3

## 2021-12-28 MED ORDER — FUROSEMIDE 10 MG/ML IJ SOLN: 40.0000 mg | Freq: Once | INTRAMUSCULAR | Status: DC

## 2021-12-28 NOTE — Progress Notes (Signed)
   Heart Failure Stewardship Pharmacist Progress Note   PCP: Elisabeth Cara, PA-C PCP-Cardiologist: [name], MD    HPI:  HPI: [name] is a 76 y.o. female with medical history significant of TTP, hypertension, obesity, anxiety, atrial fibrillation, hyperlipidemia, low back pain, CAD, diastolic CHF, hypothyroidism, mycoplasma pneumonia, splenectomy who presented with shortness of breath. She was found to have acute on chronic HF and viral URI. Most recent echo before admission was 03/2021 where LVEF was 55-60% with moderately reduced RV function. Repeat echo this admission on 11/28 showed LVEF of 55-60%, indeterminate diastolic parameters, D-shaped RV suggestive of RV volume overload. She has a history of DES to LAD s/p stenting in 2017.  Today, patient reports severe fatigue with moderate shortness of breath. She reports urine output has decreased today and is darker yellow in color. Her appetite is poor. Upper extremity is warm, toes are cold. No appreciable peripheral edema, but assessment is difficult given her size. She has an abdominal binder on today.  Current HF Medications: Beta Blocker: Toprol XL 25 mg daily SGLT2i: Spironolactone 25 mg daily Other: Imdur 30 mg daily  Prior to admission HF Medications: Diuretic: Lasix 40 mg daily Beta Blocker: Toprol XL 25 mg daily Other: Imdur 30 mg daily  Pertinent Lab Values: Serum creatinine 0.86, BUN 14, Potassium 4, Sodium 133, BNP 377.9, Magnesium 2.1   Vital Signs: Weight: 262.79 lbs (admission weight: 261.5 lbs) Blood pressure: 110-140/60-90s  Heart rate: 100s  I/O: -1.9L yesterday; net -1.9L  Medication Assistance / Insurance Benefits Check: Does the patient have prescription insurance?  Yes Type of insurance plan: Medicare  Outpatient Pharmacy:  Prior to admission outpatient [facility] Is the patient willing to use [facility] at discharge? Yes Is the patient willing to transition their outpatient  pharmacy to utilize a [facility] outpatient pharmacy?   Pending    Assessment: 1. Acute on chronic right sided CHF (LVEF 55-60%), due to ICM. NYHA class III symptoms. - Maintain strict I/Os, daily weights, Mg >2, and K >4. - Patient has limited mobility currently given her fatigue, however she reports at home being able to ambulate to the bathroom multiple times per day. Can consider [place] for right sided HF and HFpEF after external urinary catheter is removed. - BP is labile, can consider low dose ARB if BP increases in the future. - Appears volume is still present in the abdominal space, but has had limited urine output. Creatinine is stable. Given increased work of breathing, can consider a Lasix trial.  - With AMS, cold toes, increased work of breathing, and poor appetite, symptoms are concerning for low output state. Could consider right heart cath. - Continue spironolactone.   Plan: 1) Medication changes recommended at this time: - Continue current medications - Consider Lasix 40 IV x 2  2) Patient assistance: - None, Farxiga and Jardiance are $47   3)  Education  - To be completed prior to discharge  Thank you for allowing pharmacy to participate in this patient's care.  Reatha Harps, PharmD PGY2 Pharmacy Resident 12/28/2021 2:04 PM Check [email or web address] for unit specific pharmacy number

## 2021-12-28 NOTE — Progress Notes (Signed)
Pharmacy Antibiotic Note  [name] is a 76 y.o. female admitted on 12/26/2021 with pneumonia.  Pharmacy has been consulted for aztreonam dosing.  Patient currently afebrile, wbc 10. Patient with multiple antibiotic allergies. Normal renal function  Plan: Aztreonam 2g IV q8 hours  Height: '5\' 4"'$  (162.6 cm) Weight: 119.2 kg (262 lb 12.6 oz) IBW/kg (Calculated) : 54.7  Temp (24hrs), Avg:98.1 F (36.7 C), Min:97.9 F (36.6 C), Max:98.4 F (36.9 C)  Recent Labs  Lab 12/26/21 1203 12/26/21 1712 12/27/21 0631 12/28/21 0316  WBC 9.3  --  11.2* 10.1  CREATININE 0.85  --  1.07* 0.86  LATICACIDVEN 1.3 1.2  --   --     Estimated Creatinine Clearance: 70.7 mL/min (by C-G formula based on SCr of 0.86 mg/dL).    Allergies  Allergen Reactions   Ceftriaxone Rash and Other (See Comments)    "TTP"/Was hospitalized and in a coma for 60 days, per patient (Thrombotic Thrombocytopenic Purpura)   Penicillins Hives, Rash and Other (See Comments)    Cannot tolerate any "-CILLINS"; causes welts!! Has patient had a PCN reaction causing immediate rash, facial/tongue/throat swelling, SOB or lightheadedness with hypotension: Yes Has patient had a PCN reaction causing severe rash involving mucus membranes or skin necrosis: No Has patient had a PCN reaction that required hospitalization No Has patient had a PCN reaction occurring within the last 10 years: No If all of the above answers are "NO", then may proceed with Cephalosporin use.   Levofloxacin Other (See Comments)    Thrombocytopenia     Levofloxacin Other (See Comments)    Dropped blood platelets extremely low   Oxycodone Itching   Tape Other (See Comments)    Prefers paper or cloth tape Other reaction(s): Other (See Comments) Prefers paper or cloth tape Other reaction(s): Other (See Comments) Prefers paper or cloth tape   Alendronate Rash   Aspirin Other (See Comments)    Patient stated she is unable to take due to blood  thinner    Oxycodone-Acetaminophen Itching   Penicillin G Rash   Thank you for allowing pharmacy to be a part of this patient's care.  Erin Hearing PharmD., BCPS Clinical Pharmacist 12/28/2021 10:15 AM

## 2021-12-28 NOTE — Progress Notes (Signed)
Scheduled duoneb due, during RRT assessment pt is noted to have an increased WOB compared to morning nebulizer. Scattered diffuse wheezing, diminished bases. O2 [place] increased to 4L, MD notified. Per MD, okay to use bipap PRN if needed for 1-2 hours but MD will order lasix first to help see if that will prevent the need for bipap. Also discussed xopenex vs duoneb and confirmed MD is okay with duoneb for now.

## 2021-12-28 NOTE — Progress Notes (Signed)
[place] for warfarin  Indication: atrial fibrillation  Allergies  Allergen Reactions   Ceftriaxone Rash and Other (See Comments)    "TTP"/Was hospitalized and in a coma for 60 days, per patient (Thrombotic Thrombocytopenic Purpura)   Penicillins Hives, Rash and Other (See Comments)    Cannot tolerate any "-CILLINS"; causes welts!! Has patient had a PCN reaction causing immediate rash, facial/tongue/throat swelling, SOB or lightheadedness with hypotension: Yes Has patient had a PCN reaction causing severe rash involving mucus membranes or skin necrosis: No Has patient had a PCN reaction that required hospitalization No Has patient had a PCN reaction occurring within the last 10 years: No If all of the above answers are "NO", then may proceed with Cephalosporin use.   Levofloxacin Other (See Comments)    Thrombocytopenia     Levofloxacin Other (See Comments)    Dropped blood platelets extremely low   Oxycodone Itching   Tape Other (See Comments)    Prefers paper or cloth tape Other reaction(s): Other (See Comments) Prefers paper or cloth tape Other reaction(s): Other (See Comments) Prefers paper or cloth tape   Alendronate Rash   Aspirin Other (See Comments)    Patient stated she is unable to take due to blood thinner    Oxycodone-Acetaminophen Itching   Penicillin G Rash    Patient Measurements: Height: '5\' 4"'$  (162.6 cm) Weight: 119.2 kg (262 lb 12.6 oz) IBW/kg (Calculated) : 54.7 Heparin Dosing Weight: 83.2 kg  Vital Signs: Temp: 98.4 F (36.9 C) (11/28 0950) Temp Source: Oral (11/28 0950) BP: 116/67 (11/28 0950) Pulse Rate: 92 (11/28 0950)  Labs: Recent Labs    12/26/21 1203 12/26/21 1245 12/26/21 1718 12/27/21 0631 12/28/21 0316  HGB 12.8 15.3*  --  13.3 13.1  HCT 37.8 45.0  --  38.9 40.6  PLT 415*  --   --  426* 414*  LABPROT  --   --  41.9* 39.5* 32.8*  INR  --   --  4.4* 4.1* 3.2*  CREATININE 0.85  --   --   1.07* 0.86     Estimated Creatinine Clearance: 70.7 mL/min (by C-G formula based on SCr of 0.86 mg/dL).   Medical History: Past Medical History:  Diagnosis Date   CAD (coronary artery disease)    a. LHC 3/17: mLAD 80, D1 50, pRCA 30, EF 25-35%; PCI: 3.5 x 16 mm Synergy DES to mLAD // Myoview 08/2018: EF 54, normal perfusion; Low Risk   Chronic diastolic CHF 0/09/6759   Echocardiogram 08/2018: EF 55-60, mild septal basal hypertrophy, Gr 2 DD, elevated LVEDP, mild LAE, mild AI   Dilated cardiomyopathy >> EF returned to normal in NSR    Probable Tachycardia Mediated // a. Echo 12/16: Mild concentric LVH, EF 25-30%, anteroseptal akinesis, inferoseptal akinesis, anterior akinesis, trivial AI, mild MR, moderate LAE, trivial TR, trivial PI, PASP 33 mmHg  //  b. Echo 2/17: Mild LVH, EF 25-30%, diffuse HK, mild LAE  //  c. Echo 7/17: mild LVH, EF 50-55%, no RWMA, trivial AI, mild to mod LAE, mild RAE   Febrile seizure ([place]) 2004   "during TTP thing; cause my temp was 106"   High cholesterol    History of blood transfusion 2004   "several; related to TTP"   Hypertension    Hypothyroidism    Morbid obesity ([place]) 01/07/2013   Osteoarthritis of back    Paroxysmal atrial fibrillation ([place]) 08/01/2011   a. 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 Not on DOAC d/t cost issue. Pharmacy consulted to dose warfarin while inpatient. Patient took last dose of warfarin 11/26 @ 0900.   INR still elevated at 3.2 but trending down. Hemoglobin stable 13s today. Platelet count within normal limits. No bleeding issues noted. Give trend down will start low dose warfarin tonight.   PTA regimen: warfarin '5mg'$  QD except 7.'5mg'$  on Tuesdays   Goal of Therapy:  INR 2-3 Monitor  platelets by anticoagulation protocol: Yes   Plan:  Warfarin '4mg'$  tonight Daily INR Daily CBC F/u s/sx bleeding   Erin Hearing PharmD., BCPS Clinical Pharmacist 12/28/2021 2:34 PM

## 2021-12-28 NOTE — Progress Notes (Addendum)
PROGRESS NOTE    [name]  LYY:[id number] DOB: 1945/11/27 DOA: 12/26/2021 PCP: Elisabeth Cara, PA-C  76/F w/ chronic diastolic CHF, TTP, hypertension, obesity, anxiety, atrial fibrillation, hyperlipidemia, low back pain, CAD, hypothyroidism, mycoplasma pneumonia, splenectomy presenting with shortness of breath. -1-2 weeks of symptoms that include fever, cough productive of green sputum, fatigue.  fever resolved, taking only half of her prescribed dose of Lasix because of fatigue. In ED, creat normal, albumin 2.8  BNP elevated to 377. flu and COVID-negative.  Procalcitonin and blood cultures pending. Chest x-ray showing chronic interstitial changes with cardiomegaly and possible interstitial edema. -Admitted, flu and COVID PCR were negative, started on DuoNebs, IV Lasix -Repeat x-ray 11/28 with right lower lobe pneumonia, started Abx  Subjective: Tired and weak, continues to cough up thick yellow phlegm, some shortness of breath, poor oral intake  Assessment and Plan:  Acute on chronic diastolic CHF RV failure -Last echo 3/23 w/ EF 55-60%, indeterminate diastolic function and moderately reduced RV function. -Does not appear significantly volume overloaded at this time and oral intake is minimal, will hold IV Lasix today, continue Aldactone -Repeat echo with EF of 55-60%, moderately decreased RV function, increased RV pressure - Continue home metoprolol, Imdur -Needs sleep study as outpatient   Community-acquired pneumonia -Patient has had 1 to 2 weeks of cough productive of green sputum and fatigue.  No evidence of pneumonia on initial x-ray.  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, PCT could be falsely low   Hypertension - Continue  home Imdur, metoprolol   Persistent Atrial fibrillation - Continue home metoprolol and sotalol - Continue warfarin per pharmacy  History of CAD -DES to LAD in 2017 -Continue Toprol, warfarin   Hypothyroidism - Continue home Synthroid   Obesity  History of TTP History of splenectomy - platelet count normal  DVT prophylaxis: warfarin Code Status: Full Code Family Communication: Updated daughter at bedside Disposition Plan: Home likely 2 to 3 days  Consultants:    Procedures:   Antimicrobials:    Objective: Vitals:   12/28/21 0500 12/28/21 0637 12/28/21 0854 12/28/21 0950  BP:  119/83  116/67  Pulse:  (!) 106  92  Resp:  20  17  Temp:  97.9 F (36.6 C)  98.4 F (36.9 C)  TempSrc:  Oral  Oral  SpO2:  91% 98% 92%  Weight: 119.2 kg     Height:        Intake/Output Summary (Last 24 hours) at 12/28/2021 1435 Last data filed at 12/27/2021 2255 Gross per 24 hour  Intake 240 ml  Output 500 ml  Net -260 ml   Filed Weights   12/26/21 2220 12/27/21 0539 12/28/21 0500  Weight: 118.6 kg 119.7 kg 119.2 kg    Examination:  General exam: Obese chronically ill female sitting up in bed, somnolent easily arousable, oriented x 3 HEENT: Mildly elevated JVD CVS: S1-S2, regular rhythm Lungs: Poor air movement bilaterally, scattered rhonchi at the bases especially right Abdomen: Soft, nontender, bowel sounds present Extremities: No edema Skin: No rashes Psychiatry:  Mood & affect appropriate.     Data Reviewed:   CBC: Recent Labs  Lab 12/26/21 1203 12/26/21 1245 12/27/21 0631 12/28/21 0316  WBC 9.3  --  11.2* 10.1  HGB 12.8 15.3* 13.3 13.1  HCT 37.8 45.0 38.9 40.6  MCV  90.2  --  90.7 92.7  PLT 415*  --  426* 329*   Basic Metabolic Panel: Recent Labs  Lab 12/26/21 1203 12/26/21 1245 12/26/21 1718 12/27/21 0631 12/28/21 0316  NA 129* 126*  --  129* 133*  K 3.5 3.4*  --  3.6 4.0  CL 86*  --   --  89* 89*  CO2 27  --   --  28 32  GLUCOSE 138*  --    --  136* 120*  BUN 10  --   --  12 14  CREATININE 0.85  --   --  1.07* 0.86  CALCIUM 8.6*  --   --  8.5* 8.8*  MG  --   --  1.7  --  2.1   GFR: Estimated Creatinine Clearance: 70.7 mL/min (by C-G formula based on SCr of 0.86 mg/dL). Liver Function Tests: Recent Labs  Lab 12/26/21 1203 12/27/21 0631 12/28/21 0316  AST 44* 37 32  ALT '31 31 31  '$ ALKPHOS 69 74 75  BILITOT 0.9 0.9 0.5  PROT 5.8* 6.3* 6.1*  ALBUMIN 2.2* 2.4* 2.2*   No results for input(s): "LIPASE", "AMYLASE" in the last 168 hours. Recent Labs  Lab 12/27/21 1834  AMMONIA 28   Coagulation Profile: Recent Labs  Lab 12/26/21 1718 12/27/21 0631 12/28/21 0316  INR 4.4* 4.1* 3.2*   Cardiac Enzymes: No results for input(s): "CKTOTAL", "CKMB", "CKMBINDEX", "TROPONINI" in the last 168 hours. BNP (last 3 results) No results for input(s): "PROBNP" in the last 8760 hours. HbA1C: No results for input(s): "HGBA1C" in the last 72 hours. CBG: Recent Labs  Lab 12/27/21 1620  GLUCAP 120*   Lipid Profile: No results for input(s): "CHOL", "HDL", "LDLCALC", "TRIG", "CHOLHDL", "LDLDIRECT" in the last 72 hours. Thyroid Function Tests: No results for input(s): "TSH", "T4TOTAL", "FREET4", "T3FREE", "THYROIDAB" in the last 72 hours. Anemia Panel: No results for input(s): "VITAMINB12", "FOLATE", "FERRITIN", "TIBC", "IRON", "RETICCTPCT" in the last 72 hours. Urine analysis:    Component Value Date/Time   COLORURINE YELLOW 12/28/2021 0550   APPEARANCEUR CLEAR 12/28/2021 0550   LABSPEC 1.015 12/28/2021 0550   PHURINE 6.5 12/28/2021 0550   GLUCOSEU NEGATIVE 12/28/2021 0550   HGBUR TRACE (A) 12/28/2021 0550   BILIRUBINUR NEGATIVE 12/28/2021 0550   KETONESUR NEGATIVE 12/28/2021 0550   PROTEINUR NEGATIVE 12/28/2021 0550   UROBILINOGEN 0.2 10/17/2008 0024   NITRITE NEGATIVE 12/28/2021 0550   LEUKOCYTESUR SMALL (A) 12/28/2021 0550   Sepsis Labs: '@LABRCNTIP'$ (procalcitonin:4,lacticidven:4)  ) Recent Results (from the past  240 hour(s))  Culture, blood (routine x 2)     Status: None (Preliminary result)   Collection Time: 12/26/21 12:03 PM   Specimen: BLOOD  Result Value Ref Range Status   Specimen Description BLOOD SITE NOT SPECIFIED  Final   Special Requests   Final    BOTTLES DRAWN AEROBIC AND ANAEROBIC Blood Culture results may not be optimal due to an inadequate volume of blood received in culture bottles   Culture   Final    NO GROWTH 2 DAYS Performed at [facility], [place] [address] [place], [place] [address]    Report Status PENDING  Incomplete  Resp Panel by RT-PCR (Flu A&B, Covid) Anterior Nasal Swab     Status: None   Collection Time: 12/26/21 12:21 PM   Specimen: Anterior Nasal Swab  Result Value Ref Range Status   SARS Coronavirus 2 by RT PCR NEGATIVE NEGATIVE Final    Comment: (NOTE) SARS-CoV-2 target nucleic acids are NOT  DETECTED.  The SARS-CoV-2 RNA is generally detectable in upper respiratory specimens during the acute phase of infection. The lowest concentration of SARS-CoV-2 viral copies this assay can detect is 138 copies/mL. A negative result does not preclude SARS-Cov-2 infection and should not be used as the sole basis for treatment or other patient management decisions. A negative result may occur with  improper specimen collection/handling, submission of specimen other than nasopharyngeal swab, presence of viral mutation(s) within the areas targeted by this assay, and inadequate number of viral copies(<138 copies/mL). A negative result must be combined with clinical observations, patient history, and epidemiological information. The expected result is Negative.  Fact Sheet for Patients:  EntrepreneurPulse.com.au  Fact Sheet for Healthcare Providers:  IncredibleEmployment.be  This test is no t yet approved or cleared by the [place] FDA and  has been authorized for detection and/or diagnosis of SARS-CoV-2 by FDA under an  Emergency Use Authorization (EUA). This EUA will remain  in effect (meaning this test can be used) for the duration of the COVID-19 declaration under Section 564(b)(1) of the Act, 21 U.S.C.section 360bbb-3(b)(1), unless the authorization is terminated  or revoked sooner.       Influenza A by PCR NEGATIVE NEGATIVE Final   Influenza B by PCR NEGATIVE NEGATIVE Final    Comment: (NOTE) The Xpert Xpress SARS-CoV-2/FLU/RSV plus assay is intended as an aid in the diagnosis of influenza from Nasopharyngeal swab specimens and should not be used as a sole basis for treatment. Nasal washings and aspirates are unacceptable for Xpert Xpress SARS-CoV-2/FLU/RSV testing.  Fact Sheet for Patients: EntrepreneurPulse.com.au  Fact Sheet for Healthcare Providers: IncredibleEmployment.be  This test is not yet approved or cleared by the [place] FDA and has been authorized for detection and/or diagnosis of SARS-CoV-2 by FDA under an Emergency Use Authorization (EUA). This EUA will remain in effect (meaning this test can be used) for the duration of the COVID-19 declaration under Section 564(b)(1) of the Act, 21 U.S.C. section 360bbb-3(b)(1), unless the authorization is terminated or revoked.  Performed at [facility], [place] [address], [place], [place] [address]   Culture, blood (routine x 2)     Status: None (Preliminary result)   Collection Time: 12/26/21  5:13 PM   Specimen: BLOOD  Result Value Ref Range Status   Specimen Description BLOOD SITE NOT SPECIFIED  Final   Special Requests   Final    BOTTLES DRAWN AEROBIC AND ANAEROBIC Blood Culture results may not be optimal due to an inadequate volume of blood received in culture bottles   Culture   Final    NO GROWTH 2 DAYS Performed at [facility], [place] [address] [place], [place] [address]    Report Status PENDING  Incomplete  Respiratory (~20 pathogens) panel by PCR     Status: None    Collection Time: 12/26/21 10:55 PM   Specimen: Nasopharyngeal Swab; Respiratory  Result Value Ref Range Status   Adenovirus NOT DETECTED NOT DETECTED Final   Coronavirus 229E NOT DETECTED NOT DETECTED Final    Comment: (NOTE) The Coronavirus on the Respiratory Panel, DOES NOT test for the novel  Coronavirus (2019 nCoV)    Coronavirus HKU1 NOT DETECTED NOT DETECTED Final   Coronavirus NL63 NOT DETECTED NOT DETECTED Final   Coronavirus OC43 NOT DETECTED NOT DETECTED Final   Metapneumovirus NOT DETECTED NOT DETECTED Final   Rhinovirus / Enterovirus NOT DETECTED NOT DETECTED Final   Influenza A NOT DETECTED NOT DETECTED Final  Influenza B NOT DETECTED NOT DETECTED Final   Parainfluenza Virus 1 NOT DETECTED NOT DETECTED Final   Parainfluenza Virus 2 NOT DETECTED NOT DETECTED Final   Parainfluenza Virus 3 NOT DETECTED NOT DETECTED Final   Parainfluenza Virus 4 NOT DETECTED NOT DETECTED Final   Respiratory Syncytial Virus NOT DETECTED NOT DETECTED Final   Bordetella pertussis NOT DETECTED NOT DETECTED Final   Bordetella Parapertussis NOT DETECTED NOT DETECTED Final   Chlamydophila pneumoniae NOT DETECTED NOT DETECTED Final   Mycoplasma pneumoniae NOT DETECTED NOT DETECTED Final    Comment: Performed at [facility], [place] [address] [place], [place] [address]     Radiology Studies: DG Chest 2 View  Result Date: 12/28/2021 CLINICAL DATA:  Cough and weakness, shortness of breath EXAM: CHEST - 2 VIEW COMPARISON:  Radiograph 02/25/2021 FINDINGS: Unchanged enlarged cardiac silhouette. There are diffuse interstitial opacities and new alveolar opacities throughout the right lung. Small left pleural effusion. No evidence of pneumothorax. Bones are unchanged with thoracic spondylosis and bilateral shoulder osteoarthritis. There are multiple osteochondral bodies overlying the right shoulder. IMPRESSION: New airspace opacities in the right lung concerning for multifocal pneumonia or worsening  asymmetric pulmonary edema. Unchanged cardiomegaly. Electronically Signed   By: Maurine Simmering M.D.   On: 12/28/2021 08:39   ECHOCARDIOGRAM COMPLETE  Result Date: 12/27/2021    ECHOCARDIOGRAM REPORT   Patient Name:   [name] Bills Date of Exam: 12/27/2021 Medical Rec #:  [id number]          Height:       64.0 in Accession #:    [id number]         Weight:       263.9 lb Date of Birth:  September 09, 1945          BSA:          2.201 m Patient Age:    29 years           BP:           160/129 mmHg Patient Gender: F                  HR:           95 bpm. Exam Location:  Inpatient Procedure: 2D Echo, Cardiac Doppler and Color Doppler Indications:    dyspnea  History:        Patient has prior history of Echocardiogram examinations, most                 recent 03/31/2021. CHF, Arrythmias:Atrial Fibrillation; Risk                 Factors:Hypertension.  Sonographer:    Johny Chess RDCS Referring Phys: [id number] [place]  1. Left ventricular ejection fraction, by estimation, is 55 to 60%. The left ventricle has normal function. The left ventricle has no regional wall motion abnormalities. Left ventricular diastolic parameters are indeterminate.  2. D-shaped septum suggestive of RV pressure/volume overload. Right ventricular systolic function is moderately reduced. The right ventricular size is mildly enlarged. There is mildly elevated pulmonary artery systolic pressure. The estimated right ventricular systolic pressure is 56.3 mmHg.  3. Left atrial size was mild to moderately dilated.  4. Right atrial size was moderately dilated.  5. The mitral valve is normal in structure. Mild mitral valve regurgitation. No evidence of mitral stenosis.  6. The aortic valve is tricuspid. There is mild calcification of the aortic valve. Aortic valve regurgitation is trivial. No  aortic stenosis is present.  7. The inferior vena cava is dilated in size with >50% respiratory variability, suggesting right atrial pressure of  8 mmHg.  8. The patient was in atrial fibrillation. FINDINGS  Left Ventricle: Left ventricular ejection fraction, by estimation, is 55 to 60%. The left ventricle has normal function. The left ventricle has no regional wall motion abnormalities. The left ventricular internal cavity size was normal in size. There is  no left ventricular hypertrophy. Left ventricular diastolic parameters are indeterminate. Right Ventricle: D-shaped septum suggestive of RV pressure/volume overload. The right ventricular size is mildly enlarged. No increase in right ventricular wall thickness. Right ventricular systolic function is moderately reduced. There is mildly elevated pulmonary artery systolic pressure. The tricuspid regurgitant velocity is 2.90 m/s, and with an assumed right atrial pressure of 8 mmHg, the estimated right ventricular systolic pressure is 16.1 mmHg. Left Atrium: Left atrial size was mild to moderately dilated. Right Atrium: Right atrial size was moderately dilated. Pericardium: Trivial pericardial effusion is present. Mitral Valve: The mitral valve is normal in structure. Mild mitral annular calcification. Mild mitral valve regurgitation. No evidence of mitral valve stenosis. Tricuspid Valve: The tricuspid valve is normal in structure. Tricuspid valve regurgitation is trivial. Aortic Valve: The aortic valve is tricuspid. There is mild calcification of the aortic valve. Aortic valve regurgitation is trivial. No aortic stenosis is present. Pulmonic Valve: The pulmonic valve was normal in structure. Pulmonic valve regurgitation is trivial. Aorta: The aortic root is normal in size and structure. Venous: The inferior vena cava is dilated in size with greater than 50% respiratory variability, suggesting right atrial pressure of 8 mmHg. IAS/Shunts: No atrial level shunt detected by color flow Doppler.  LEFT VENTRICLE PLAX 2D LVIDd:         4.00 cm LVIDs:         3.10 cm LV PW:         0.90 cm LV IVS:        1.10 cm LVOT  diam:     1.90 cm LVOT Area:     2.84 cm  IVC IVC diam: 2.10 cm LEFT ATRIUM             Index        RIGHT ATRIUM           Index LA diam:        4.20 cm 1.91 cm/m   RA Area:     22.00 cm LA Vol (A2C):   60.1 ml 27.31 ml/m  RA Volume:   66.50 ml  30.21 ml/m LA Vol (A4C):   87.2 ml 39.62 ml/m LA Biplane Vol: 79.9 ml 36.30 ml/m   AORTA Ao Root diam: 2.80 cm Ao Asc diam:  3.40 cm TRICUSPID VALVE TR Peak grad:   33.6 mmHg TR Vmax:        290.00 cm/s  SHUNTS Systemic Diam: 1.90 cm Dalton McleanMD Electronically signed by Franki Monte Signature Date/Time: 12/27/2021/1:04:21 PM    Final      Scheduled Meds:  benzonatate  100 mg Oral TID   guaiFENesin  600 mg Oral BID   ipratropium-albuterol  3 mL Nebulization TID   isosorbide mononitrate  30 mg Oral Daily   levothyroxine  50 mcg Oral Q0600   metoprolol succinate  25 mg Oral Daily   sodium chloride flush  3 mL Intravenous Q12H   sotalol  120 mg Oral BID   spironolactone  25 mg Oral Daily   Warfarin - Pharmacist  Dosing Inpatient   Does not apply q1600   Continuous Infusions:  aztreonam 2 g (12/28/21 1208)     LOS: 1 day    Time spent: 4mn    PDomenic Polite MD [facility]   12/28/2021, 2:35 PM

## 2021-12-29 DIAGNOSIS — E871 Hypo-osmolality and hyponatremia: Secondary | ICD-10-CM | POA: Diagnosis not present

## 2021-12-29 DIAGNOSIS — J189 Pneumonia, unspecified organism: Secondary | ICD-10-CM | POA: Diagnosis present

## 2021-12-29 DIAGNOSIS — I4819 Other persistent atrial fibrillation: Secondary | ICD-10-CM | POA: Diagnosis not present

## 2021-12-29 DIAGNOSIS — I10 Essential (primary) hypertension: Secondary | ICD-10-CM | POA: Diagnosis not present

## 2021-12-29 DIAGNOSIS — I5033 Acute on chronic diastolic (congestive) heart failure: Secondary | ICD-10-CM | POA: Diagnosis not present

## 2021-12-29 DIAGNOSIS — E039 Hypothyroidism, unspecified: Secondary | ICD-10-CM | POA: Diagnosis not present

## 2021-12-29 LAB — PROCALCITONIN: Procalcitonin: <0.1 ng/mL

## 2021-12-29 LAB — CBC
HCT: 40.9 % (ref 36.0–46.0)
Hemoglobin: 13.2 g/dL (ref 12.0–15.0)
MCH: 30.4 pg (ref 26.0–34.0)
MCHC: 32.3 g/dL (ref 30.0–36.0)
MCV: 94.2 fL (ref 80.0–100.0)
Platelets: 452 10*3/uL — ABNORMAL HIGH (ref 150–400)
RBC: 4.34 MIL/uL (ref 3.87–5.11)
RDW: 15.6 % — ABNORMAL HIGH (ref 11.5–15.5)
WBC: 10.8 10*3/uL — ABNORMAL HIGH (ref 4.0–10.5)
nRBC: 0.2 % (ref 0.0–0.2)

## 2021-12-29 LAB — PROTIME-INR
INR: 3.6 — ABNORMAL HIGH (ref 0.8–1.2)
Prothrombin Time: 35.9 seconds — ABNORMAL HIGH (ref 11.4–15.2)

## 2021-12-29 LAB — URINE CULTURE: Culture: NO GROWTH

## 2021-12-29 LAB — BASIC METABOLIC PANEL
Anion gap: 10 (ref 5–15)
BUN: 11 mg/dL (ref 8–23)
CO2: 29 mmol/L (ref 22–32)
Calcium: 7.9 mg/dL — ABNORMAL LOW (ref 8.9–10.3)
Chloride: 90 mmol/L — ABNORMAL LOW (ref 98–111)
Creatinine, Ser: 0.72 mg/dL (ref 0.44–1.00)
GFR, Estimated: >60 mL/min (ref >60)
Glucose, Bld: 128 mg/dL — ABNORMAL HIGH (ref 70–99)
Potassium: 4.2 mmol/L (ref 3.5–5.1)
Sodium: 129 mmol/L — ABNORMAL LOW (ref 135–145)

## 2021-12-29 MED ORDER — TRAZODONE HCL 50 MG PO TABS
25.0000 mg | ORAL_TABLET | Freq: Every evening | ORAL | Status: DC | PRN
  Administered 2021-12-29 – 2022-01-01 (×4): 25 mg via ORAL
  Filled 2021-12-29 (×4): qty 1

## 2021-12-29 MED ORDER — EMPAGLIFLOZIN 10 MG PO TABS: 10.0000 mg | ORAL_TABLET | Freq: Every day | ORAL | Status: DC

## 2021-12-29 MED ORDER — FUROSEMIDE 10 MG/ML IJ SOLN
60.0000 mg | Freq: Two times a day (BID) | INTRAMUSCULAR | Status: DC
  Administered 2021-12-29 – 2022-01-02 (×8): 60 mg via INTRAVENOUS
  Filled 2021-12-29 (×8): qty 6

## 2021-12-29 NOTE — Assessment & Plan Note (Addendum)
Continue rate control with metoprolol and sotalol Anticoagulation with warfarin.  INR therapeutic.

## 2021-12-29 NOTE — Assessment & Plan Note (Addendum)
Hypervolemic hyponatremia.   Renal function with serum cr a 1,0 with K at 4,3 and serum bicarbonate at 39,  Na is 129.  Plan to continue diuresis with furosemide and spironolactone.  Follow up rena function and electrolytes in am.

## 2021-12-29 NOTE — Progress Notes (Addendum)
  Progress Note   Patient: [name] EHM:[id number] DOB: 09-30-1945 DOA: 12/26/2021     2 DOS: the patient was seen and examined on 12/29/2021   Brief hospital course: Mrs. Micucci was admitted to the hospital with the working diagnosis of heart failure decompensation.   76 yo female with the past medical history of hypertension, atrial fibrillation, diastolic heart failure, hypothyroidism, sp splenectomy and obesity class 3 who presented with dyspnea. Reported 7 days of fever, productive cough, and fatigue. During this time she lowered the dose of furosemide. On her initial physical examination her blood pressure was 121/97, HR 98, RR 24 and 02 saturation 95%, lungs with rales but not wheezing, heart with S1 and S2 present and rhythmic, abdomen with no distention, and positive lower extremity edema.   Na 129, K 3,5 CL 86 bicarbonate 27 glucose 138 bun 10 cr 0,85  BNP 377 Wbc 9,3 hgb 12,8 plt 415  Sars covid 19 negative   Urine analysis SG 1,002, negative protein, 11-20 wbc   Chest radiograph with interstitial infiltrates at lower lobes, more right than left.   EKG 93 bpm, right axis, atrial fibrillation rhythm, with PVC, no significant ST segment or T wave changes.   Patient was placed on furosemide for diuresis.   Assessment and Plan: * Acute on chronic diastolic CHF (congestive heart failure) (HCC) Echocardiogram with preserved LV systolic function 55 to 36%, D shaped septum in systole and diastole, RVSP 41,9 mmHg. RA with moderate dilatation.   Urine output is 6,294 ml  Systolic blood pressure is 120 to 127 mmHg.   Plan to continue diuresis with furosemide 60 mg IV q12 Spironolactone  Hold SGLT inh for now, pyuria  Persistent atrial fibrillation (HCC) Continue rate control with metoprolol and sotalol Anticoagulation with warfarin.   Hypertension Continue blood pressure control with metoprolol and sotalol.   History of TTP (thrombotic thrombocytopenic purpura) Sp  splenectomy. Cell count has been stable.   Hypothyroidism (acquired) Continue levothyroxine   Class 3 obesity (HCC) Calculated BMI is 44,6   CAP (community acquired pneumonia) Patient with immunosuppression, possible pneumonia, will continue antibiotic therapy with Aztreonam for now.   Hyponatremia Hypervolemic hyponatremia.  Serum Na is 129 with K at 4,2 and serum bicarbonate at 29, Continue diuresis with furosemide, follow up renal function in am.         Subjective: Patient continue to have dyspnea, no chest pain, very weak and deconditioned   Physical Exam: Vitals:   12/29/21 0503 12/29/21 0758 12/29/21 0932 12/29/21 1336  BP: (!) 127/94  121/70   Pulse: 99  92   Resp: 17     Temp: 98 F (36.7 C)     TempSrc: Oral     SpO2:  93%  94%  Weight: 118 kg     Height:       Neurology awake and alert ENT with mild pallor Cardiovascular with S1 and S2 present, irregularly irregular with no gallops, rubs or murmurs.  Respiratory with no rales or wheezing  Abdomen with no distention Positive lower extremity edema  Data Reviewed:   Family Communication: I spoke with patient's friend at the bedside, we talked in detail about patient's condition, plan of care and prognosis and all questions were addressed.   Disposition: Status is: Inpatient Remains inpatient appropriate because: IV diuresis   Planned Discharge Destination: Home     Author: Tawni Millers, MD 12/29/2021 3:25 PM  For on call review [email or web address].

## 2021-12-29 NOTE — Progress Notes (Signed)
[place] for warfarin  Indication: atrial fibrillation  Allergies  Allergen Reactions   Ceftriaxone Rash and Other (See Comments)    "TTP"/Was hospitalized and in a coma for 60 days, per patient (Thrombotic Thrombocytopenic Purpura)   Penicillins Hives, Rash and Other (See Comments)    Cannot tolerate any "-CILLINS"; causes welts!! Has patient had a PCN reaction causing immediate rash, facial/tongue/throat swelling, SOB or lightheadedness with hypotension: Yes Has patient had a PCN reaction causing severe rash involving mucus membranes or skin necrosis: No Has patient had a PCN reaction that required hospitalization No Has patient had a PCN reaction occurring within the last 10 years: No If all of the above answers are "NO", then may proceed with Cephalosporin use.   Levofloxacin Other (See Comments)    Thrombocytopenia     Levofloxacin Other (See Comments)    Dropped blood platelets extremely low   Oxycodone Itching   Tape Other (See Comments)    Prefers paper or cloth tape Other reaction(s): Other (See Comments) Prefers paper or cloth tape Other reaction(s): Other (See Comments) Prefers paper or cloth tape   Alendronate Rash   Aspirin Other (See Comments)    Patient stated she is unable to take due to blood thinner    Oxycodone-Acetaminophen Itching   Penicillin G Rash    Patient Measurements: Height: '5\' 4"'$  (162.6 cm) Weight: 118 kg (260 lb 2.3 oz) IBW/kg (Calculated) : 54.7 Heparin Dosing Weight: 83.2 kg  Vital Signs: Temp: 98 F (36.7 C) (11/29 0503) Temp Source: Oral (11/29 0503) BP: 121/70 (11/29 0932) Pulse Rate: 92 (11/29 0932)  Labs: Recent Labs    12/27/21 0631 12/28/21 0316 12/29/21 0335  HGB 13.3 13.1 13.2  HCT 38.9 40.6 40.9  PLT 426* 414* 452*  LABPROT 39.5* 32.8* 35.9*  INR 4.1* 3.2* 3.6*  CREATININE 1.07* 0.86 0.72     Estimated Creatinine Clearance: 75.6 mL/min (by C-G formula based on SCr of 0.72  mg/dL).   Medical History: Past Medical History:  Diagnosis Date   CAD (coronary artery disease)    a. LHC 3/17: mLAD 80, D1 50, pRCA 30, EF 25-35%; PCI: 3.5 x 16 mm Synergy DES to mLAD // Myoview 08/2018: EF 54, normal perfusion; Low Risk   Chronic diastolic CHF 07/21/3084   Echocardiogram 08/2018: EF 55-60, mild septal basal hypertrophy, Gr 2 DD, elevated LVEDP, mild LAE, mild AI   Dilated cardiomyopathy >> EF returned to normal in NSR    Probable Tachycardia Mediated // a. Echo 12/16: Mild concentric LVH, EF 25-30%, anteroseptal akinesis, inferoseptal akinesis, anterior akinesis, trivial AI, mild MR, moderate LAE, trivial TR, trivial PI, PASP 33 mmHg  //  b. Echo 2/17: Mild LVH, EF 25-30%, diffuse HK, mild LAE  //  c. Echo 7/17: mild LVH, EF 50-55%, no RWMA, trivial AI, mild to mod LAE, mild RAE   Febrile seizure ([place]) 2004   "during TTP thing; cause my temp was 106"   High cholesterol    History of blood transfusion 2004   "several; related to TTP"   Hypertension    Hypothyroidism    Morbid obesity ([place]) 01/07/2013   Osteoarthritis of back    Paroxysmal atrial fibrillation ([place]) 08/01/2011   a. started on Sotalol during 10/2015 admission (Tikosyn too expensive)   Pneumonia ?1999   Scoliosis    Thyroid nodule    "grew back after 2 partial thyroid surgeries" (09/29/2015)   TTP (thrombotic thrombocytopenic purpura) ([place]) 05/03/2011  Assessment: 76 yo F on warfarin PTA for afib. Not on DOAC d/t cost issue. Pharmacy consulted to dose warfarin while inpatient. Patient took last dose of warfarin 11/26 @ 0900.   INR still elevated at 3.4 was trending down yesterday but now going back up today. Will plan to hold until INR has trended down below 3. Hemoglobin stable 13s. Platelet count within normal limits. No bleeding issues noted.   PTA regimen: warfarin '5mg'$  QD except 7.'5mg'$  on Tuesdays   Goal of Therapy:  INR 2-3 Monitor platelets by anticoagulation protocol: Yes   Plan:  Hold  warfarin tonight - allow INR to trend down Daily INR Daily CBC F/u s/sx bleeding   Erin Hearing PharmD., BCPS Clinical Pharmacist 12/29/2021 2:07 PM

## 2021-12-29 NOTE — Plan of Care (Signed)

## 2021-12-29 NOTE — Hospital Course (Addendum)
[name] was admitted to the hospital with the working diagnosis of heart failure decompensation.   76 yo female with the past medical history of hypertension, atrial fibrillation, diastolic heart failure, hypothyroidism, sp splenectomy and obesity class 3 who presented with dyspnea. Reported 7 days of fever, productive cough, and fatigue. During this time she lowered the dose of furosemide. On her initial physical examination her blood pressure was 121/97, HR 98, RR 24 and 02 saturation 95%, lungs with rales but not wheezing, heart with S1 and S2 present and rhythmic, abdomen with no distention, and positive lower extremity edema.   Na 129, K 3,5 CL 86 bicarbonate 27 glucose 138 bun 10 cr 0,85  BNP 377 Wbc 9,3 hgb 12,8 plt 415  Sars covid 19 negative   Urine analysis SG 1,002, negative protein, 11-20 wbc   Chest radiograph with interstitial infiltrates at lower lobes, more right than left.   EKG 93 bpm, right axis, atrial fibrillation rhythm, with PVC, no significant ST segment or T wave changes.   Patient was placed on furosemide for diuresis.   11/30 slowly improving volume status.  12/01 clinically improving, plan for SNF.  12/02 continue to improve volume status.

## 2021-12-29 NOTE — Assessment & Plan Note (Signed)
Continue blood pressure control with metoprolol and sotalol.

## 2021-12-29 NOTE — Progress Notes (Signed)
Left Forearm IV infiltration noted. IV access discontinued. New IV access placed on R Forearm, Elevated left forearm.

## 2021-12-29 NOTE — Progress Notes (Signed)
   Heart Failure Stewardship Pharmacist Progress Note   PCP: Elisabeth Cara, PA-C PCP-Cardiologist: [name], MD    HPI:  HPI: [name] is a 76 y.o. female with medical history significant of TTP, hypertension, obesity, anxiety, atrial fibrillation, hyperlipidemia, low back pain, CAD, diastolic CHF, hypothyroidism, mycoplasma pneumonia, splenectomy who presented with shortness of breath. She was found to have acute on chronic HF and viral URI. Most recent echo before admission was 03/2021 where LVEF was 55-60% with moderately reduced RV function. Repeat echo this admission on 11/28 showed LVEF of 55-60%, indeterminate diastolic parameters, D-shaped RV suggestive of RV volume overload. She has a history of DES to LAD s/p stenting in 2017.  Today, patient reports severe fatigue with continued shortness of breath. She has altered mental status this morning. Her appetite remains poor. Upper extremity is relatively warm, however toes are cold under several blankets. No appreciable peripheral edema, but assessment is difficult given her size. No urine output in the last 5 hours.  Current HF Medications: Beta Blocker: Toprol XL 25 mg daily SGLT2i: Spironolactone 25 mg daily Other: Imdur 30 mg daily  Prior to admission HF Medications: Diuretic: Lasix 40 mg daily Beta Blocker: Toprol XL 25 mg daily Other: Imdur 30 mg daily  Pertinent Lab Values: Serum creatinine 0.72, BUN 11, Potassium 4.2, Sodium 129, BNP 377.9, Magnesium 2.1   Vital Signs: Weight: 260.1 lbs (admission weight: 261.5 lbs) Blood pressure: 110-130/60-90s  Heart rate: 100s  I/O: -0.85L yesterday; net -2.7L  Medication Assistance / Insurance Benefits Check: Does the patient have prescription insurance?  Yes Type of insurance plan: Medicare  Outpatient Pharmacy:  Prior to admission outpatient pharmacy: [facility] Is the patient willing to use [facility] at discharge? Yes Is the patient willing to transition  their outpatient pharmacy to utilize a [facility] outpatient pharmacy?   Pending    Assessment: 1. Acute on chronic right sided CHF (LVEF 55-60%), due to ICM. NYHA class III symptoms. - Maintain strict I/Os, daily weights, Mg >2, and K >4. - With AMS, cold toes, increased work of breathing, and poor appetite, symptoms are concerning for low output state. Pending MD evaluation, may need to hold metoprolol if signs of low output concerned. - Patient has limited mobility currently given her fatigue, however she reports at home being able to ambulate to the bathroom multiple times per day. Can consider [place] for right sided HF and HFpEF after external urinary catheter is removed. - BP is labile, hold on GDMT titration, can consider low dose ARB if BP increases in the future. - Appears volume is still present in the abdominal space, but has had limited urine output today. Creatinine is stable. Given increased work of breathing and moderate urine output yesterday, can consider redosing Lasix.  - Continue spironolactone.   Plan: 1) Medication changes recommended at this time: - Continue current medications - Consider Lasix 40 IV BID  2) Patient assistance: - None, Farxiga and Jardiance are $47   3)  Education  - To be completed prior to discharge  Thank you for allowing pharmacy to participate in this patient's care.  Reatha Harps, PharmD PGY2 Pharmacy Resident 12/29/2021 8:14 AM Check [email or web address] for unit specific pharmacy number

## 2021-12-29 NOTE — Assessment & Plan Note (Addendum)
Present on admission.  Patient with immunosuppression, possible pneumonia, initially on IV Aztreonam and then transitioned to doxycycline.   Airway clearing techniques with flutter valve and incentive spirometer. Continue with bronchodilator therapy and inhaled steroids.   Acute on chronic hypoxemic respiratory failure, currently 02 saturation is 98% on 2 L/min per [place].

## 2021-12-29 NOTE — Assessment & Plan Note (Signed)
Sp splenectomy. Cell count has been stable.

## 2021-12-29 NOTE — Assessment & Plan Note (Addendum)
Echocardiogram with preserved LV systolic function 55 to 41%, D shaped septum in systole and diastole, RVSP 41,9 mmHg. RA with moderate dilatation.   Urine output is documented 2,650 ml (question if accurate)  Systolic blood pressure is 110 to 115 mmHg.   Continue with furosemide 60 mg IV q12 and spironolactone  Hold SGLT inh for now, pyuria

## 2021-12-29 NOTE — Assessment & Plan Note (Signed)
Continue levothyroxine 

## 2021-12-29 NOTE — Assessment & Plan Note (Signed)
Calculated BMI is 44,6

## 2021-12-30 DIAGNOSIS — I10 Essential (primary) hypertension: Secondary | ICD-10-CM | POA: Diagnosis not present

## 2021-12-30 DIAGNOSIS — E039 Hypothyroidism, unspecified: Secondary | ICD-10-CM | POA: Diagnosis not present

## 2021-12-30 DIAGNOSIS — E871 Hypo-osmolality and hyponatremia: Secondary | ICD-10-CM

## 2021-12-30 DIAGNOSIS — J189 Pneumonia, unspecified organism: Secondary | ICD-10-CM

## 2021-12-30 DIAGNOSIS — I5033 Acute on chronic diastolic (congestive) heart failure: Secondary | ICD-10-CM | POA: Diagnosis not present

## 2021-12-30 DIAGNOSIS — I4819 Other persistent atrial fibrillation: Secondary | ICD-10-CM | POA: Diagnosis not present

## 2021-12-30 LAB — CBC
HCT: 41.3 % (ref 36.0–46.0)
Hemoglobin: 13.5 g/dL (ref 12.0–15.0)
MCH: 30.9 pg (ref 26.0–34.0)
MCHC: 32.7 g/dL (ref 30.0–36.0)
MCV: 94.5 fL (ref 80.0–100.0)
Platelets: 486 10*3/uL — ABNORMAL HIGH (ref 150–400)
RBC: 4.37 MIL/uL (ref 3.87–5.11)
RDW: 15.8 % — ABNORMAL HIGH (ref 11.5–15.5)
WBC: 9 10*3/uL (ref 4.0–10.5)
nRBC: 0 % (ref 0.0–0.2)

## 2021-12-30 LAB — BASIC METABOLIC PANEL
Anion gap: 10 (ref 5–15)
BUN: 10 mg/dL (ref 8–23)
CO2: 39 mmol/L — ABNORMAL HIGH (ref 22–32)
Calcium: 8.7 mg/dL — ABNORMAL LOW (ref 8.9–10.3)
Chloride: 86 mmol/L — ABNORMAL LOW (ref 98–111)
Creatinine, Ser: 0.85 mg/dL (ref 0.44–1.00)
GFR, Estimated: >60 mL/min (ref >60)
Glucose, Bld: 120 mg/dL — ABNORMAL HIGH (ref 70–99)
Potassium: 4.2 mmol/L (ref 3.5–5.1)
Sodium: 135 mmol/L (ref 135–145)

## 2021-12-30 LAB — PROTIME-INR
INR: 3 — ABNORMAL HIGH (ref 0.8–1.2)
Prothrombin Time: 30.6 seconds — ABNORMAL HIGH (ref 11.4–15.2)

## 2021-12-30 LAB — MAGNESIUM: Magnesium: 1.9 mg/dL (ref 1.7–2.4)

## 2021-12-30 MED ORDER — BISACODYL 5 MG PO TBEC
10.0000 mg | DELAYED_RELEASE_TABLET | Freq: Once | ORAL | Status: AC
  Administered 2021-12-30: 10 mg via ORAL
  Filled 2021-12-30: qty 2

## 2021-12-30 MED ORDER — MOMETASONE FURO-FORMOTEROL FUM 100-5 MCG/ACT IN AERO
2.0000 | INHALATION_SPRAY | Freq: Two times a day (BID) | RESPIRATORY_TRACT | Status: DC
  Administered 2021-12-30 – 2022-01-03 (×8): 2 via RESPIRATORY_TRACT
  Filled 2021-12-30: qty 8.8

## 2021-12-30 MED ORDER — WARFARIN SODIUM 2.5 MG PO TABS
2.5000 mg | ORAL_TABLET | Freq: Once | ORAL | Status: AC
  Administered 2021-12-30: 2.5 mg via ORAL
  Filled 2021-12-30: qty 1

## 2021-12-30 MED ORDER — DOXYCYCLINE HYCLATE 100 MG PO TABS
100.0000 mg | ORAL_TABLET | Freq: Two times a day (BID) | ORAL | Status: DC
  Administered 2021-12-30 – 2022-01-03 (×8): 100 mg via ORAL
  Filled 2021-12-30 (×8): qty 1

## 2021-12-30 NOTE — Progress Notes (Signed)
Progress Note   Patient: [name] ATF:[id number] DOB: 10-29-1945 DOA: 12/26/2021     3 DOS: the patient was seen and examined on 12/30/2021   Brief hospital course: [name] was admitted to the hospital with the working diagnosis of heart failure decompensation.   76 yo female with the past medical history of hypertension, atrial fibrillation, diastolic heart failure, hypothyroidism, sp splenectomy and obesity class 3 who presented with dyspnea. Reported 7 days of fever, productive cough, and fatigue. During this time she lowered the dose of furosemide. On her initial physical examination her blood pressure was 121/97, HR 98, RR 24 and 02 saturation 95%, lungs with rales but not wheezing, heart with S1 and S2 present and rhythmic, abdomen with no distention, and positive lower extremity edema.   Na 129, K 3,5 CL 86 bicarbonate 27 glucose 138 bun 10 cr 0,85  BNP 377 Wbc 9,3 hgb 12,8 plt 415  Sars covid 19 negative   Urine analysis SG 1,002, negative protein, 11-20 wbc   Chest radiograph with interstitial infiltrates at lower lobes, more right than left.   EKG 93 bpm, right axis, atrial fibrillation rhythm, with PVC, no significant ST segment or T wave changes.   Patient was placed on furosemide for diuresis.   11/30 slowly improving volume status.   Assessment and Plan: * Acute on chronic diastolic CHF (congestive heart failure) (HCC) Echocardiogram with preserved LV systolic function 55 to 27%, D shaped septum in systole and diastole, RVSP 41,9 mmHg. RA with moderate dilatation.   Urine output is 0,623 ml  Systolic blood pressure is 120 to 130 mmHg.   Diuresis with furosemide 60 mg IV q12 and spironolactone  Hold SGLT inh for now, pyuria  Persistent atrial fibrillation (HCC) Continue rate control with metoprolol and sotalol Anticoagulation with warfarin.  INR today 3,0   Hypertension Continue blood pressure control with metoprolol and sotalol.   History of  TTP (thrombotic thrombocytopenic purpura) Sp splenectomy. Cell count has been stable.   Hypothyroidism (acquired) Continue levothyroxine   CAP (community acquired pneumonia) Patient with immunosuppression, possible pneumonia, will continue antibiotic therapy with Aztreonam for now.  Airway clearing techniques with flutter valve and incentive spirometer. Add bronchodilator therapy and inhaled steroids.   Acute on chronic hypoxemic respiratory failure, 02 saturation is 98% on 3 L/min per [place]. Will assess if patient needs repeat chest radiograph or non contrast CT chest.   Hyponatremia Hypervolemic hyponatremia.   Renal function with serum cr at 0,95, with K at 4,2 and serum bicarbonate at 39. Na 135 Continue diuresis with follow up renal function in am.   Class 3 obesity (HCC) Calculated BMI is 44,6         Subjective: Patient is feeling better but not back to her baseline, today with dizziness worse with movement. No chest pain   Physical Exam: Vitals:   12/30/21 0425 12/30/21 0737 12/30/21 0757 12/30/21 1119  BP: 137/89 104/71  118/76  Pulse: (!) 105 (!) 102  (!) 105  Resp: '18 18  20  '$ Temp: 98.2 F (36.8 C) 98.9 F (37.2 C)  97.6 F (36.4 C)  TempSrc: Oral Oral  Oral  SpO2: 98% 96% 98% 98%  Weight: 116.7 kg     Height:       Neurology awake and alert ENT with mild pallor Cardiovascular with S1 and S2 present, irregularly irregular with no gallops, rubs or murmurs Respiratory with rhonchi, rales and wheezing bilaterally  Abdomen with no distention  No lower  extremity edema  Data Reviewed:    Family Communication: I spoke with patient's daughters at the bedside, we talked in detail about patient's condition, plan of care and prognosis and all questions were addressed.   Disposition: Status is: Inpatient Remains inpatient appropriate because: IV furosemide and IV antibiotic   Planned Discharge Destination: Home    Author: Tawni Millers,  MD 12/30/2021 11:41 AM  For on call review [email or web address].

## 2021-12-30 NOTE — Evaluation (Signed)
Physical Therapy Evaluation Patient Details Name: [name] MRN: [id number] DOB: [birth year] Today's Date: 12/30/2021  History of Present Illness  The pt is a 76 yo female presenting 11/26 with SOB. Pt found to have CHF exacerbation and viral URI. PMH includes: CAD s/p DES, CHF, afib on warfarin, thrombotic thrombocytopenic purpura, HTN, hypothyroidism, and morbid obesity.   Clinical Impression  Pt in bed upon arrival of PT, agreeable to evaluation at this time. Prior to admission the pt states she is largely sedentary outside of short-distance ambulation to her bathroom and back, relies on family to deliver food and provide transportation to MD appointments. The pt now presents with limitations in functional mobility, strength, and activity tolerance due to above dx and pt reports of dizziness, and will continue to benefit from skilled PT to address these deficits. This evaluation was significantly limited by pt reports of dizziness despite all vitals stable, as the pt declined all attempts at mobility other than bed-level exercises and repositioning into chair position in bed. The pt was able to to demo anti-gravity movement of BLE with increased time and encouragement, but does fatigue quickly. Will benefit from continued skilled PT acutely to progress OOB mobility and independence to allow for safest possible return home as pt declines suggestion of SNF rehab after hospitalization.   Pt is inconsistent with reports regarding family support, initially stating her family cannot stay with her after d/c to provide increased assist or support, later stating she has multiple children that live near her who assist. Will continue attempts to motivate pt to participate in OOB mobility to determine level of assist needed.        Recommendations for follow up therapy are one component of a multi-disciplinary discharge planning process, led by the attending physician.  Recommendations may be updated  based on patient status, additional functional criteria and insurance authorization.  Follow Up Recommendations Home health PT (pt refusing SNF, not safe for return home at this time)      Assistance Recommended at Discharge Frequent or constant Supervision/Assistance  Patient can return home with the following  Two people to help with walking and/or transfers;Two people to help with bathing/dressing/bathroom;Assistance with cooking/housework;Direct supervision/assist for medications management;Direct supervision/assist for financial management;Assist for transportation;Help with stairs or ramp for entrance    Equipment Recommendations Wheelchair (measurements PT);BSC/3in1;Wheelchair cushion (measurements PT)  Recommendations for Other Services       Functional Status Assessment Patient has had a recent decline in their functional status and demonstrates the ability to make significant improvements in function in a reasonable and predictable amount of time.     Precautions / Restrictions Precautions Precautions: Fall Precaution Comments: complaining of dizziness Restrictions Weight Bearing Restrictions: No      Mobility  Bed Mobility Overal bed mobility: Needs Assistance             General bed mobility comments: Repositioned in bed/chair position. pt able to help pull up in bed and bridge with knees.    Transfers                   General transfer comment: pt declined; states 2 women tried to get her up but couldn't - unsure if due to weakness or dizziness.       Balance Overall balance assessment: History of Falls  Pertinent Vitals/Pain Pain Assessment Pain Assessment: No/denies pain    Home Living Family/patient expects to be discharged to:: Private residence Living Arrangements: Alone Available Help at Discharge: Family;Available PRN/intermittently Type of Home: House Home Access: Level  entry       Home Layout: One level Home Equipment: Shower seat;Grab bars - toilet;Grab bars - tub/shower;Rollator (4 wheels) Additional Comments: Pt reports she has never opened her rollator, but has access at home. bathroom is small and she can hold onto other objects while moving    Prior Function Prior Level of Function : Independent/Modified Independent             Mobility Comments: Reports she fatigues quickly with mobility. reports she only walks to bathroom and back to chair, otherwise sedentary ADLs Comments: pt reports she uses seat in shower, has people bring food, uses WC from MD to get inside to appointments     Hand Dominance   Dominant Hand: Right    Extremity/Trunk Assessment   Upper Extremity Assessment Upper Extremity Assessment: Generalized weakness (pt slow to move but able to use BUE to reposition in bed afte max cues)    Lower Extremity Assessment Lower Extremity Assessment: Generalized weakness (after max encouragement, pt able to move BLE against gravity and mod resistance. reports she is unsure if anything is numb or if she has had any changes in sensation)    Cervical / Trunk Assessment Cervical / Trunk Assessment: Other exceptions (increased body habitus)  Communication   Communication: No difficulties  Cognition Arousal/Alertness: Awake/alert Behavior During Therapy: WFL for tasks assessed/performed (tangential at times; anxious about potentially falling) Overall Cognitive Status: No family/caregiver present to determine baseline cognitive functioning                                 General Comments: most likely at baseline; states she is "not putting off" pt self-limiting with mobility, requires multiple prompts and then pt able to complete most tasks.        General Comments General comments (skin integrity, edema, etc.): VSS; BP 125/69; 137/91; HR 103    Exercises     Assessment/Plan    PT Assessment Patient needs  continued PT services  PT Problem List Decreased strength;Decreased activity tolerance;Decreased balance;Decreased mobility;Decreased safety awareness;Obesity       PT Treatment Interventions DME instruction;Gait training;Stair training;Functional mobility training;Therapeutic activities;Therapeutic exercise;Balance training;Patient/family education    PT Goals (Current goals can be found in the Care Plan section)  Acute Rehab PT Goals Patient Stated Goal: return home PT Goal Formulation: With patient Time For Goal Achievement: 01/13/22 Potential to Achieve Goals: Fair         Co-evaluation PT/OT/SLP Co-Evaluation/Treatment: Yes Reason for Co-Treatment: For patient/therapist safety;To address functional/ADL transfers;Other (comment) (poor activity tolerance) PT goals addressed during session: Mobility/safety with mobility;Strengthening/ROM OT goals addressed during session: ADL's and self-care       AM-PAC PT "6 Clicks" Mobility  Outcome Measure Help needed turning from your back to your side while in a flat bed without using bedrails?: A Lot Help needed moving from lying on your back to sitting on the side of a flat bed without using bedrails?: A Lot Help needed moving to and from a bed to a chair (including a wheelchair)?: Total Help needed standing up from a chair using your arms (e.g., wheelchair or bedside chair)?: Total Help needed to walk in hospital room?: Total Help needed climbing 3-5 steps  with a railing? : Total 6 Click Score: 8    End of Session   Activity Tolerance: Treatment limited secondary to medical complications (Comment) (pt refusal and dizziness) Patient left: in bed;with call bell/phone within reach;with bed alarm set (bed in chair position) Nurse Communication: Mobility status (pt refusing mobility) PT Visit Diagnosis: Other abnormalities of gait and mobility (R26.89);Muscle weakness (generalized) (M62.81);Dizziness and giddiness (R42)    Time:  9728-2060 PT Time Calculation (min) (ACUTE ONLY): 39 min   Charges:   PT Evaluation $PT Eval Low Complexity: 1 Low PT Treatments $Therapeutic Activity: 8-22 mins        [place] Carbo, PT, DPT   Acute Rehabilitation Department  [name] 12/30/2021, 11:43 AM

## 2021-12-30 NOTE — Evaluation (Signed)
Occupational Therapy Evaluation Patient Details Name: [name] MRN: [id number] DOB: 12-Mar-1945 Today's Date: 12/30/2021   History of Present Illness The pt is a 76 yo female presenting 11/26 with SOB. Pt found to have CHF exacerbation and viral URI. PMH includes: CAD s/p DES, CHF, afib on warfarin, thrombotic thrombocytopenic purpura, HTN, hypothyroidism, and morbid obesity.   Clinical Impression   Evaluation limited due to complaints of dizziness and being too cold. Multiple attempts made to mobilize pt however pt declined.  Pt's goal is to DC home. Hopefully pt will progress and be able to DC home with assistance of family. VSS during session at bed level. Acute OT to follow.      Recommendations for follow up therapy are one component of a multi-disciplinary discharge planning process, led by the attending physician.  Recommendations may be updated based on patient status, additional functional criteria and insurance authorization.   Follow Up Recommendations  Home health OT     Assistance Recommended at Discharge Frequent or constant Supervision/Assistance (declines SNF)  Patient can return home with the following A little help with walking and/or transfers;A little help with bathing/dressing/bathroom;Assistance with cooking/housework;Direct supervision/assist for medications management;Direct supervision/assist for financial management;Assist for transportation;Help with stairs or ramp for entrance    Functional Status Assessment  Patient has had a recent decline in their functional status and demonstrates the ability to make significant improvements in function in a reasonable and predictable amount of time.  Equipment Recommendations  None recommended by OT    Recommendations for Other Services       Precautions / Restrictions Precautions Precautions: Fall Precaution Comments: complaining of dizziness      Mobility Bed Mobility Overal bed mobility: Needs  Assistance             General bed mobility comments: REpositioned in bed/chair position. pt able to help pull up in bed adn bridge with knees    Transfers                   General transfer comment: pt declined; states 2 women tried to get her up but couldn't - unsure if due to weakness or dizziness      Balance Overall balance assessment: History of Falls                                         ADL either performed or assessed with clinical judgement   ADL Overall ADL's : Needs assistance/impaired Eating/Feeding: Modified independent   Grooming: Set up   Upper Body Bathing: Set up   Lower Body Bathing: Maximal assistance;Bed level   Upper Body Dressing : Set up;Bed level   Lower Body Dressing: Maximal assistance;Bed level            States taking care of herself had become difficult to do because "I was sick". May benefit from AE   Functional mobility during ADLs:  (declined)       Vision         Perception     Praxis      Pertinent Vitals/Pain Pain Assessment Pain Assessment: No/denies pain     Hand Dominance Right   Extremity/Trunk Assessment Upper Extremity Assessment Upper Extremity Assessment: Generalized weakness   Lower Extremity Assessment Lower Extremity Assessment: Defer to PT evaluation   Cervical / Trunk Assessment Cervical / Trunk Assessment: Other exceptions (increased body habitus)  Communication Communication Communication: No difficulties   Cognition Arousal/Alertness: Awake/alert Behavior During Therapy: WFL for tasks assessed/performed (tangential at times; anxious about potentially falling) Overall Cognitive Status: No family/caregiver present to determine baseline cognitive functioning                                 General Comments: most likely at baseline; states she is "not putting off"     General Comments  VSS; BP 125/69; 137/91; HR 103    Exercises     Shoulder  Instructions      Home Living Family/patient expects to be discharged to:: Private residence Living Arrangements: Alone Available Help at Discharge: Family;Available PRN/intermittently Type of Home: House Home Access: Level entry     Home Layout: One level     Bathroom Shower/Tub: Tub/shower unit;Curtain   Bathroom Toilet: Standard Bathroom Accessibility: No   Home Equipment: Conservation officer, nature (2 wheels);Shower seat;Grab bars - toilet;Grab bars - tub/shower   Additional Comments: Pt reports she has never opened her RW, but has access at home. bathroom is small and she can hold onto other objects while moving      Prior Functioning/Environment Prior Level of Function : Independent/Modified Independent             Mobility Comments: Reports she fatigues quickly with mobility. reports she only walks to bathroom and back to chair, otherwise sedentary ADLs Comments: pt reports she uses seat in shower, has people bring food, uses WC from MD to get inside to appointments        OT Problem List: Decreased strength;Decreased activity tolerance;Decreased safety awareness;Obesity;Cardiopulmonary status limiting activity      OT Treatment/Interventions: Self-care/ADL training;Energy conservation;DME and/or AE instruction;Therapeutic activities;Patient/family education    OT Goals(Current goals can be found in the care plan section) Acute Rehab OT Goals Patient Stated Goal: to find out what is causing her dizziness OT Goal Formulation: With patient Time For Goal Achievement: 01/13/22 Potential to Achieve Goals: Good  OT Frequency: Min 2X/week    Co-evaluation PT/OT/SLP Co-Evaluation/Treatment: Yes Reason for Co-Treatment: For patient/therapist safety   OT goals addressed during session: ADL's and self-care      AM-PAC OT "6 Clicks" Daily Activity     Outcome Measure Help from another person eating meals?: None Help from another person taking care of personal grooming?: A  Little Help from another person toileting, which includes using toliet, bedpan, or urinal?: A Lot Help from another person bathing (including washing, rinsing, drying)?: A Lot Help from another person to put on and taking off regular upper body clothing?: A Little Help from another person to put on and taking off regular lower body clothing?: A Lot 6 Click Score: 16   End of Session Nurse Communication: Mobility status  Activity Tolerance: Other (comment);Patient limited by fatigue (pt limiting participation due to complaints of dizziness) Patient left: in bed;with call bell/phone within reach;with bed alarm set;Other (comment) (modified chair position)  OT Visit Diagnosis: Muscle weakness (generalized) (M62.81);Unsteadiness on feet (R26.81)                Time: 8891-6945 OT Time Calculation (min): 19 min Charges:  OT General Charges $OT Visit: 1 Visit OT Evaluation $OT Eval Moderate Complexity: [place], OT/L   Acute OT Clinical Specialist Acute Rehabilitation Services Pager [phone] Office [phone]   [facility] 12/30/2021, 10:04 AM

## 2021-12-30 NOTE — Progress Notes (Signed)
[place] for warfarin  Indication: atrial fibrillation  Allergies  Allergen Reactions   Ceftriaxone Rash and Other (See Comments)    "TTP"/Was hospitalized and in a coma for 60 days, per patient (Thrombotic Thrombocytopenic Purpura)   Penicillins Hives, Rash and Other (See Comments)    Cannot tolerate any "-CILLINS"; causes welts!! Has patient had a PCN reaction causing immediate rash, facial/tongue/throat swelling, SOB or lightheadedness with hypotension: Yes Has patient had a PCN reaction causing severe rash involving mucus membranes or skin necrosis: No Has patient had a PCN reaction that required hospitalization No Has patient had a PCN reaction occurring within the last 10 years: No If all of the above answers are "NO", then may proceed with Cephalosporin use.   Levofloxacin Other (See Comments)    Thrombocytopenia     Levofloxacin Other (See Comments)    Dropped blood platelets extremely low   Oxycodone Itching   Tape Other (See Comments)    Prefers paper or cloth tape Other reaction(s): Other (See Comments) Prefers paper or cloth tape Other reaction(s): Other (See Comments) Prefers paper or cloth tape   Alendronate Rash   Aspirin Other (See Comments)    Patient stated she is unable to take due to blood thinner    Oxycodone-Acetaminophen Itching   Penicillin G Rash    Patient Measurements: Height: '5\' 4"'$  (162.6 cm) Weight: 116.7 kg (257 lb 4.4 oz) IBW/kg (Calculated) : 54.7 Heparin Dosing Weight: 83.2 kg  Vital Signs: Temp: 97.6 F (36.4 C) (11/30 1119) Temp Source: Oral (11/30 1119) BP: 118/76 (11/30 1119) Pulse Rate: 105 (11/30 1119)  Labs: Recent Labs    12/28/21 0316 12/29/21 0335 12/30/21 0649  HGB 13.1 13.2 13.5  HCT 40.6 40.9 41.3  PLT 414* 452* 486*  LABPROT 32.8* 35.9* 30.6*  INR 3.2* 3.6* 3.0*  CREATININE 0.86 0.72 0.85     Estimated Creatinine Clearance: 70.7 mL/min (by C-G formula based on SCr of  0.85 mg/dL).   Medical History: Past Medical History:  Diagnosis Date   CAD (coronary artery disease)    a. LHC 3/17: mLAD 80, D1 50, pRCA 30, EF 25-35%; PCI: 3.5 x 16 mm Synergy DES to mLAD // Myoview 08/2018: EF 54, normal perfusion; Low Risk   Chronic diastolic CHF 05/09/8117   Echocardiogram 08/2018: EF 55-60, mild septal basal hypertrophy, Gr 2 DD, elevated LVEDP, mild LAE, mild AI   Dilated cardiomyopathy >> EF returned to normal in NSR    Probable Tachycardia Mediated // a. Echo 12/16: Mild concentric LVH, EF 25-30%, anteroseptal akinesis, inferoseptal akinesis, anterior akinesis, trivial AI, mild MR, moderate LAE, trivial TR, trivial PI, PASP 33 mmHg  //  b. Echo 2/17: Mild LVH, EF 25-30%, diffuse HK, mild LAE  //  c. Echo 7/17: mild LVH, EF 50-55%, no RWMA, trivial AI, mild to mod LAE, mild RAE   Febrile seizure ([place]) 2004   "during TTP thing; cause my temp was 106"   High cholesterol    History of blood transfusion 2004   "several; related to TTP"   Hypertension    Hypothyroidism    Morbid obesity ([place]) 01/07/2013   Osteoarthritis of back    Paroxysmal atrial fibrillation ([place]) 08/01/2011   a. started on Sotalol during 10/2015 admission (Tikosyn too expensive)   Pneumonia ?1999   Scoliosis    Thyroid nodule    "grew back after 2 partial thyroid surgeries" (09/29/2015)   TTP (thrombotic thrombocytopenic purpura) ([place]) 05/03/2011  Assessment: 76 yo F on warfarin PTA for afib. Not on DOAC d/t cost issue. Pharmacy consulted to dose warfarin while inpatient.   -INR 3.0 with trend down  PTA regimen: warfarin '5mg'$  QD except 7.'5mg'$  on Tuesdays   Goal of Therapy:  INR 2-3 Monitor platelets by anticoagulation protocol: Yes   Plan:  -Warfarin 2.'5mg'$  today -Daily PT/INR  Hildred Laser, PharmD Clinical Pharmacist **Pharmacist phone directory can now be found on [email or web address] ([email or web address]).  Listed under [place].

## 2021-12-30 NOTE — Progress Notes (Signed)
   Heart Failure Stewardship Pharmacist Progress Note   PCP: Elisabeth Cara, PA-C PCP-Cardiologist: [name], MD    HPI:  HPI: [name] is a 76 y.o. female with medical history significant of TTP, hypertension, obesity, anxiety, atrial fibrillation, hyperlipidemia, low back pain, CAD, diastolic CHF, hypothyroidism, mycoplasma pneumonia, splenectomy who presented with shortness of breath. She was found to have acute on chronic HF and viral URI. Most recent echo before admission was 03/2021 where LVEF was 55-60% with moderately reduced RV function. Repeat echo this admission on 11/28 showed LVEF of 55-60%, indeterminate diastolic parameters, D-shaped RV suggestive of RV volume overload. She has a history of DES to LAD s/p stenting in 2017.  Current HF Medications: Diuretic: furosemide 60 mg IV BID Beta Blocker: Toprol XL 25 mg daily SGLT2i: Spironolactone 25 mg daily Other: Imdur 30 mg daily  Prior to admission HF Medications: Diuretic: Lasix 40 mg daily Beta Blocker: Toprol XL 25 mg daily Other: Imdur 30 mg daily  Pertinent Lab Values: Serum creatinine 0.85, BUN 10, Potassium 4.2, Sodium 135, BNP 377.9, Magnesium 1.9  Vital Signs: Weight: 257 lbs (admission weight: 261.5 lbs) Blood pressure: 110-130/80s  Heart rate: 100s  I/O: -1.8L yesterday; net -4.6L since admission   Medication Assistance / Insurance Benefits Check: Does the patient have prescription insurance?  Yes Type of insurance plan: Medicare  Outpatient Pharmacy:  Prior to admission outpatient [facility] Is the patient willing to use [facility] at discharge? Yes Is the patient willing to transition their outpatient pharmacy to utilize a [facility] outpatient pharmacy?   Pending    Assessment: 1. Acute on chronic right sided CHF (LVEF 55-60%), due to ICM. NYHA class III symptoms. - Maintain strict I/Os, daily weights, Mg >2, and K >4. - Patient has limited mobility currently given her  fatigue, however she reports at home being able to ambulate to the bathroom multiple times per day. Can consider [place] for right sided HF and HFpEF after external urinary catheter is removed. - BP is labile, hold on GDMT titration, can consider low dose ARB if BP increases in the future. - Remains volume overloaded, continue furosemide 60 mg IV BID - Continue spironolactone.   Plan: 1) Medication changes recommended at this time: - Continue current medications  2) Patient assistance: - None, Farxiga and Jardiance are $47   3)  Education  - To be completed prior to discharge  Kerby Nora, PharmD, BCPS Heart Failure Cytogeneticist Phone [phone]

## 2021-12-30 NOTE — TOC Initial Note (Signed)
Transition of Care [facility]) - Initial/Assessment Note    Patient Details  Name: [name] MRN: [id number] Date of Birth: 26-Jun-1945  Transition of Care [facility]) CM/SW Contact:    Bethena Roys, RN Phone Number: 12/30/2021, 4:24 PM  Clinical Narrative:  Case Manager spoke with patient regarding disposition needs. PTA patient was from home alone and has DME RW in the home. PT/OT recommendations are for [facility] if the patient has two person support in the home. Patient lives alone and her children that live in town work. Daughter @ the bedside is visiting from [place] and she will return home on Tuesday. Daughter reports that the patient will not have family support and the patient would benefit from SNF. Patient is not on board with SNF at this time. Case Manager did make the patient and daughter aware that personal care services would cost between $21.00-29.00 an hour. Patient cannot safely be in the home alone. Case Manager did ask the patient to think about SNF and the daughter will discus SNF with the patient as well. MD and CSW is aware. Case Manager to continue to follow.                Expected Discharge Plan: Skilled Nursing Facility Barriers to Discharge: Continued Medical Work up   Patient Goals and CMS Choice        Expected Discharge Plan and Services Expected Discharge Plan: [place] In-house Referral: Clinical Social Work Discharge Planning Services: CM Consult   Living arrangements for the past 2 months: [place]    Prior Living Arrangements/Services Living arrangements for the past 2 months: Single Family Home Lives with:: Self (Daughter is in the room visiting from [place]) Patient language and need for interpreter reviewed:: Yes        Need for Family Participation in Patient Care: Yes (Comment) Care giver support system in place?: Yes (comment) Current home services: DME (has a rolling walker at home.) Criminal  Activity/Legal Involvement Pertinent to Current Situation/Hospitalization: No - Comment as needed  Activities of Daily Living Home Assistive Devices/Equipment: None ADL Screening (condition at time of admission) Patient's cognitive ability adequate to safely complete daily activities?: Yes Is the patient deaf or have difficulty hearing?: No Does the patient have difficulty seeing, even when wearing glasses/contacts?: No Does the patient have difficulty concentrating, remembering, or making decisions?: No Patient able to express need for assistance with ADLs?: Yes Does the patient have difficulty dressing or bathing?: No Independently performs ADLs?: Yes (appropriate for developmental age) Does the patient have difficulty walking or climbing stairs?: Yes Weakness of Legs: Both Weakness of Arms/Hands: Both  Permission Sought/Granted Permission sought to share information with : Family Supports, Customer service manager, Case Manager   Emotional Assessment Appearance:: Appears stated age Attitude/Demeanor/Rapport: Apprehensive Affect (typically observed): Flat Orientation: : Oriented to Self, Oriented to Place, Oriented to  Time, Oriented to Situation Alcohol / Substance Use: Not Applicable Psych Involvement: No (comment)  Admission diagnosis:  Acute on chronic diastolic CHF (congestive heart failure) ([place]) [I50.33] Acute on chronic congestive heart failure, unspecified heart failure type ([place]) [I50.9] Patient Active Problem List   Diagnosis Date Noted   CAP (community acquired pneumonia) 12/29/2021   Hyponatremia 12/29/2021   Acute on chronic diastolic CHF (congestive heart failure) ([place]) 12/26/2021   Chest pain 03/30/2021   CAD (coronary artery disease) 03/30/2021   Renal insufficiency 08/16/2020   Cough 10/16/2017   History of splenectomy 07/20/2017   History of TTP (thrombotic  thrombocytopenic purpura) 07/20/2017   Mycoplasma pneumonia 07/06/2017   Postural dizziness  with presyncope    Bronchitis 07/05/2017   Chronic diastolic CHF 50/53/9767   Hyperglycemia 09/23/2016   Age-related osteoporosis without current pathological fracture 08/27/2015   Vitamin D deficiency 08/27/2015   Hyperlipidemia 08/06/2015   Persistent atrial fibrillation ([place]) 04/18/2015   Cardiomyopathy, ischemic 04/18/2015   Hypertensive heart disease with heart failure ([place]) 04/15/2015   Anxiety 04/15/2015   Encounter for therapeutic drug monitoring 01/21/2015   Dyspnea on exertion 01/05/2015   Chronic anticoagulation 05/05/2014   Primary insomnia 05/05/2014   Long-term use of high-risk medication 02/05/2014   Hypothyroidism (acquired) 06/14/2013   Localized osteoporosis without current pathological fracture 06/14/2013   Low back pain 06/14/2013   Class 3 obesity ([place]) 01/07/2013   Polypharmacy 01/07/2013   Hypertension 08/01/2011   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

## 2021-12-31 ENCOUNTER — Encounter (HOSPITAL_COMMUNITY): Payer: Self-pay | Admitting: Internal Medicine

## 2021-12-31 LAB — BASIC METABOLIC PANEL
Anion gap: 9 (ref 5–15)
BUN: 13 mg/dL (ref 8–23)
CO2: 39 mmol/L — ABNORMAL HIGH (ref 22–32)
Calcium: 8.7 mg/dL — ABNORMAL LOW (ref 8.9–10.3)
Chloride: 81 mmol/L — ABNORMAL LOW (ref 98–111)
Creatinine, Ser: 1.03 mg/dL — ABNORMAL HIGH (ref 0.44–1.00)
GFR, Estimated: 56 mL/min — ABNORMAL LOW (ref >60)
Glucose, Bld: 133 mg/dL — ABNORMAL HIGH (ref 70–99)
Potassium: 4.3 mmol/L (ref 3.5–5.1)
Sodium: 129 mmol/L — ABNORMAL LOW (ref 135–145)

## 2021-12-31 LAB — CULTURE, BLOOD (ROUTINE X 2)
Culture: NO GROWTH
Culture: NO GROWTH

## 2021-12-31 LAB — PROTIME-INR
INR: 2.4 — ABNORMAL HIGH (ref 0.8–1.2)
Prothrombin Time: 25.6 seconds — ABNORMAL HIGH (ref 11.4–15.2)

## 2021-12-31 MED ORDER — IPRATROPIUM-ALBUTEROL 0.5-2.5 (3) MG/3ML IN SOLN
3.0000 mL | Freq: Two times a day (BID) | RESPIRATORY_TRACT | Status: DC
  Administered 2021-12-31: 3 mL via RESPIRATORY_TRACT
  Filled 2021-12-31: qty 3

## 2021-12-31 MED ORDER — WARFARIN SODIUM 5 MG PO TABS
5.0000 mg | ORAL_TABLET | Freq: Once | ORAL | Status: AC
  Administered 2021-12-31: 5 mg via ORAL
  Filled 2021-12-31: qty 1

## 2021-12-31 NOTE — Progress Notes (Signed)
Physical Therapy Treatment Patient Details Name: [name] MRN: [id number] DOB: 09-05-1945 Today's Date: 12/31/2021   History of Present Illness The pt is a 76 yo female presenting 11/26 with SOB. Pt found to have CHF exacerbation and viral URI. PMH includes: CAD s/p DES, CHF, afib on warfarin, thrombotic thrombocytopenic purpura, HTN, hypothyroidism, and morbid obesity.    PT Comments    Pt agreeable to session now that she reports dizziness is resolved. The pt was able to demo good improvement in bed mobility but continues to rely on HOB elevated and momentum to complete movements. The pt was able to complete sit-stand and short bout of steps to recliner but declined further ambulation or mobility due to onset of dizziness and fatigue after short bout of exertion. Given pt's prior level of independence and current mobility limitations (<5 ft) recommend SNF rehab to maximize functional recovery and return to baseline independence prior to return home.     Recommendations for follow up therapy are one component of a multi-disciplinary discharge planning process, led by the attending physician.  Recommendations may be updated based on patient status, additional functional criteria and insurance authorization.  Follow Up Recommendations  Skilled nursing-short term rehab (<3 hours/day) Can patient physically be transported by private vehicle: Yes   Assistance Recommended at Discharge Frequent or constant Supervision/Assistance  Patient can return home with the following Assistance with cooking/housework;Direct supervision/assist for medications management;Direct supervision/assist for financial management;Assist for transportation;Help with stairs or ramp for entrance;A lot of help with walking and/or transfers;A lot of help with bathing/dressing/bathroom   Equipment Recommendations  Wheelchair (measurements PT);BSC/3in1;Wheelchair cushion (measurements PT)    Recommendations for Other  Services       Precautions / Restrictions Precautions Precautions: Fall Restrictions Weight Bearing Restrictions: No     Mobility  Bed Mobility Overal bed mobility: Needs Assistance Bed Mobility: Supine to Sit     Supine to sit: Supervision, HOB elevated     General bed mobility comments: pt did use HOB elevated and bed rails, but was able to move LE without assist and used momentum to complete movement and scoot at EOB without assist    Transfers Overall transfer level: Needs assistance Equipment used: Rolling walker (2 wheels) Transfers: Sit to/from Stand Sit to Stand: Min guard           General transfer comment: pt able to rise to standing with minG, no overt LOB but does reach for UE support on RW.    Ambulation/Gait Ambulation/Gait assistance: Min guard Gait Distance (Feet): 4 Feet Assistive device: Rolling walker (2 wheels) Gait Pattern/deviations: Step-through pattern, Decreased stride length, Shuffle, Trunk flexed Gait velocity: decreased Gait velocity interpretation: <1.31 ft/sec, indicative of household ambulator   General Gait Details: small forwards steps to recliner with pt dependent on BUE support. Poor activity tolerance as pt too fatigued to continue mobility after this short bout of walking.      Balance Overall balance assessment: History of Falls, Mild deficits observed, not formally tested                                          Cognition Arousal/Alertness: Awake/alert Behavior During Therapy: WFL for tasks assessed/performed (tangential at times; anxious about potentially falling) Overall Cognitive Status: No family/caregiver present to determine baseline cognitive functioning  General Comments: most likely at baseline        Exercises      General Comments General comments (skin integrity, edema, etc.): VSS on RA, BP with slight drop from sitting EOB to sitting in  chair after transfer with pt reporting in dizziness. pt declined offer to stand again for orthostatic vitals      Pertinent Vitals/Pain Pain Assessment Pain Assessment: No/denies pain     PT Goals (current goals can now be found in the care plan section) Acute Rehab PT Goals Patient Stated Goal: return home PT Goal Formulation: With patient Time For Goal Achievement: 01/13/22 Potential to Achieve Goals: Fair Progress towards PT goals: Progressing toward goals    Frequency    Min 3X/week      PT Plan Current plan remains appropriate       AM-PAC PT "6 Clicks" Mobility   Outcome Measure  Help needed turning from your back to your side while in a flat bed without using bedrails?: A Little Help needed moving from lying on your back to sitting on the side of a flat bed without using bedrails?: A Little Help needed moving to and from a bed to a chair (including a wheelchair)?: A Little Help needed standing up from a chair using your arms (e.g., wheelchair or bedside chair)?: A Little Help needed to walk in hospital room?: Total (<20 ft) Help needed climbing 3-5 steps with a railing? : Total 6 Click Score: 14    End of Session Equipment Utilized During Treatment: Gait belt Activity Tolerance: Treatment limited secondary to medical complications (Comment);Patient limited by fatigue (dizziness) Patient left: with call bell/phone within reach;in chair;with chair alarm set Nurse Communication: Mobility status PT Visit Diagnosis: Other abnormalities of gait and mobility (R26.89);Muscle weakness (generalized) (M62.81);Dizziness and giddiness (R42)     Time: 1446-1510 PT Time Calculation (min) (ACUTE ONLY): 24 min  Charges:  $Therapeutic Exercise: 8-22 mins $Therapeutic Activity: 8-22 mins                     [place] Carbo, PT, DPT   Acute Rehabilitation Department   [name] 12/31/2021, 4:14 PM

## 2021-12-31 NOTE — Progress Notes (Signed)
Progress Note   Patient: [name] DOB: 01-Jul-1945 DOA: 12/26/2021     4 DOS: the patient was seen and examined on 12/31/2021   Brief hospital course: [name] was admitted to the hospital with the working diagnosis of heart failure decompensation.   76 yo female with the past medical history of hypertension, atrial fibrillation, diastolic heart failure, hypothyroidism, sp splenectomy and obesity class 3 who presented with dyspnea. Reported 7 days of fever, productive cough, and fatigue. During this time she lowered the dose of furosemide. On her initial physical examination her blood pressure was 121/97, HR 98, RR 24 and 02 saturation 95%, lungs with rales but not wheezing, heart with S1 and S2 present and rhythmic, abdomen with no distention, and positive lower extremity edema.   Na 129, K 3,5 CL 86 bicarbonate 27 glucose 138 bun 10 cr 0,85  BNP 377 Wbc 9,3 hgb 12,8 plt 415  Sars covid 19 negative   Urine analysis SG 1,002, negative protein, 11-20 wbc   Chest radiograph with interstitial infiltrates at lower lobes, more right than left.   EKG 93 bpm, right axis, atrial fibrillation rhythm, with PVC, no significant ST segment or T wave changes.   Patient was placed on furosemide for diuresis.   11/30 slowly improving volume status.  12/01 clinically improving, plan for SNF.   Assessment and Plan: * Acute on chronic diastolic CHF (congestive heart failure) (HCC) Echocardiogram with preserved LV systolic function 55 to 82%, D shaped septum in systole and diastole, RVSP 41,9 mmHg. RA with moderate dilatation.   Urine output is documented 1,100 ml (question if accurate)  Systolic blood pressure is 108 to 130 mmHg.   Continue diuresis with furosemide 60 mg IV q12 and spironolactone  Hold SGLT inh for now, pyuria  Persistent atrial fibrillation (HCC) Continue rate control with metoprolol and sotalol Anticoagulation with warfarin.  INR therapeutic.    Hypertension Continue blood pressure control with metoprolol and sotalol.   History of TTP (thrombotic thrombocytopenic purpura) Sp splenectomy. Cell count has been stable.   Hypothyroidism (acquired) Continue levothyroxine   CAP (community acquired pneumonia) Patient with immunosuppression, possible pneumonia, initially on IV Aztreonam and now transitioned to doxycycline.   Airway clearing techniques with flutter valve and incentive spirometer. Continue with bronchodilator therapy and inhaled steroids.   Acute on chronic hypoxemic respiratory failure, 02 saturation is 98% on 3 L/min per [place]. Dyspnea improving, continue supplemental 02 per [place]  Hyponatremia Hypervolemic hyponatremia.   Renal function with serum cr a 1,0 with K at 4,3 and serum bicarbonate at 39,  Na is 129.  Plan to continue diuresis with furosemide and spironolactone.  Follow up rena function and electrolytes in am.   Class 3 obesity (HCC) Calculated BMI is 44,6         Subjective: Patient with no chest pain, dyspnea has been improving, positive bowel movement. She is in agreement for SNF  Physical Exam: Vitals:   12/31/21 0524 12/31/21 0533 12/31/21 0834 12/31/21 0843  BP: 130/85     Pulse: 89  (!) 108   Resp: 18  19   Temp: 97.7 F (36.5 C)     TempSrc: Oral     SpO2: 99%  96% 98%  Weight:  118.4 kg    Height:       Neurology awake and alert ENT with mild pallor Cardiovascular with S1 and S2 present, irregularly irregular with no gallops, rubs or murmurs Respiratory with scattered rhonchi with no wheezing  Abdomen with no distention  No pitting lower extremity edema  Data Reviewed:    Family Communication: I spoke with patient's daughter at the bedside, we talked in detail about patient's condition, plan of care and prognosis and all questions were addressed.    Disposition: Status is: Inpatient Remains inpatient appropriate because: heart failure pending placement to SNF    Planned Discharge Destination: Skilled nursing facility      Author: Tawni Millers, MD 12/31/2021 12:54 PM  For on call review [email or web address].

## 2021-12-31 NOTE — Progress Notes (Signed)
   Heart Failure Stewardship Pharmacist Progress Note   PCP: Elisabeth Cara, PA-C PCP-Cardiologist: [name], MD    HPI:  HPI: [name] is a 76 y.o. female with medical history significant of TTP, hypertension, obesity, anxiety, atrial fibrillation, hyperlipidemia, low back pain, CAD, diastolic CHF, hypothyroidism, mycoplasma pneumonia, splenectomy who presented with shortness of breath. She was found to have acute on chronic HF and viral URI. Most recent echo before admission was 03/2021 where LVEF was 55-60% with moderately reduced RV function. Repeat echo this admission on 11/28 showed LVEF of 55-60%, indeterminate diastolic parameters, D-shaped RV suggestive of RV volume overload. She has a history of DES to LAD s/p stenting in 2017.  Current HF Medications: Diuretic: furosemide 60 mg IV BID Beta Blocker: Toprol XL 25 mg daily SGLT2i: Spironolactone 25 mg daily Other: Imdur 30 mg daily  Prior to admission HF Medications: Diuretic: Lasix 40 mg daily Beta Blocker: Toprol XL 25 mg daily Other: Imdur 30 mg daily  Pertinent Lab Values: Serum creatinine 1.03, BUN 13, Potassium 4.3, Sodium 129, BNP 377.9, Magnesium 1.9  Vital Signs: Weight: 261 lbs (admission weight: 261.5 lbs) Blood pressure: 110-130/80s  Heart rate: 80-100s  I/O: -1L yesterday; net -5.8L since admission   Medication Assistance / Insurance Benefits Check: Does the patient have prescription insurance?  Yes Type of insurance plan: Medicare  Outpatient Pharmacy[facility]  [facility] the patient willing to use [place] at discharge? Yes Is the patient willing to transition their outpatient pharmacy to utilize a [facility] outpatient pharmacy?   Pending    Assessment: 1. Acute on chronic right sided CHF (LVEF 55-60%), due to ICM. NYHA class III symptoms. - Maintain strict I/Os, daily weights, Mg >2, and K >4. - Patient has limited mobility currently given her  fatigue, however she reports at home being able to ambulate to the bathroom multiple times per day. Can consider [place] for right sided HF and HFpEF after external urinary catheter is removed. - BP is labile, hold on GDMT titration, can consider low dose ARB if BP increases in the future. - Remains volume overloaded, continue furosemide 60 mg IV BID - Continue spironolactone.   Plan: 1) Medication changes recommended at this time: - Continue current medications  2) Patient assistance: - None, Farxiga and Jardiance are $47   3)  Education  - To be completed prior to discharge  Kerby Nora, PharmD, BCPS Heart Failure Cytogeneticist Phone [phone]

## 2021-12-31 NOTE — Care Management Important Message (Signed)
Important Message  Patient Details  Name: [name] MRN: [id number] Date of Birth: Sep 19, 1945   Medicare Important Message Given:  Yes     Shelda Altes 12/31/2021, 9:13 AM

## 2021-12-31 NOTE — TOC Initial Note (Signed)
Transition of Care [facility]) - Initial/Assessment Note    Patient Details  Name: [name] MRN: [id number] Date of Birth: 1945/08/23  Transition of Care [facility]) CM/SW Contact:    Milas Gain, [place] Phone Number: 12/31/2021, 10:22 AM  Clinical Narrative:                  CSW received consult for possible SNF placement at time of discharge. CSW spoke with patient at bedside regarding PT recommendation of SNF placement at time of discharge. Patient reports she comes from home alone. Patient expressed understanding of PT recommendation and is agreeable to SNF placement at time of discharge. Patient reports preference for [facility] . Patient also gave CSW permission to fax out initial referral near the [place] area for possible SNF placement.CSW discussed insurance authorization process and will provide Medicare SNF ratings list with accepted SNF bed offers when available. No further questions reported at this time. CSW to continue to follow and assist with discharge planning needs.  Expected Discharge Plan: Skilled Nursing Facility Barriers to Discharge: Continued Medical Work up   Patient Goals and CMS Choice Patient states their goals for this hospitalization and ongoing recovery are:: SNF CMS Medicare.gov Compare Post Acute Care list provided to:: Patient Choice offered to / list presented to : Patient  Expected Discharge Plan and Services Expected Discharge Plan: [place] In-house Referral: Clinical Social Work Discharge Planning Services: CM Consult   Living arrangements for the past 2 months: [place]                                      Prior Living Arrangements/Services Living arrangements for the past 2 months: Single Family Home Lives with:: Self Patient language and need for interpreter reviewed:: Yes Do you feel safe going back to the place where you live?: No   SNF  Need for Family Participation in Patient Care: Yes  (Comment) Care giver support system in place?: Yes (comment) Current home services: DME (has a rolling walker at home.) Criminal Activity/Legal Involvement Pertinent to Current Situation/Hospitalization: No - Comment as needed  Activities of Daily Living Home Assistive Devices/Equipment: None ADL Screening (condition at time of admission) Patient's cognitive ability adequate to safely complete daily activities?: Yes Is the patient deaf or have difficulty hearing?: No Does the patient have difficulty seeing, even when wearing glasses/contacts?: No Does the patient have difficulty concentrating, remembering, or making decisions?: No Patient able to express need for assistance with ADLs?: Yes Does the patient have difficulty dressing or bathing?: No Independently performs ADLs?: Yes (appropriate for developmental age) Does the patient have difficulty walking or climbing stairs?: Yes Weakness of Legs: Both Weakness of Arms/Hands: Both  Permission Sought/Granted Permission sought to share information with : Case Manager, Family Supports, Chartered certified accountant granted to share information with : Yes, Verbal Permission Granted  Share Information with NAME: [name]  Permission granted to share info w AGENCY: SNF  Permission granted to share info w Relationship: daughter  Permission granted to share info w Contact Information: Lenna Sciara [phone]  Emotional Assessment Appearance:: Appears stated age Attitude/Demeanor/Rapport: Gracious Affect (typically observed): Calm Orientation: : Oriented to Self, Oriented to Place, Oriented to  Time, Oriented to Situation Alcohol / Substance Use: Not Applicable Psych Involvement: No (comment)  Admission diagnosis:  Acute on chronic diastolic CHF (congestive heart failure) (HCC) [I50.33] Acute on chronic congestive heart failure, unspecified  heart failure type [facility]) [I50.9] Patient Active Problem List   Diagnosis Date Noted    CAP (community acquired pneumonia) 12/29/2021   Hyponatremia 12/29/2021   Acute on chronic diastolic CHF (congestive heart failure) ([place]) 12/26/2021   Chest pain 03/30/2021   CAD (coronary artery disease) 03/30/2021   Renal insufficiency 08/16/2020   Cough 10/16/2017   History of splenectomy 07/20/2017   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   Mycoplasma pneumonia 07/06/2017   Postural dizziness with presyncope    Bronchitis 07/05/2017   Chronic diastolic CHF 88/82/8003   Hyperglycemia 09/23/2016   Age-related osteoporosis without current pathological fracture 08/27/2015   Vitamin D deficiency 08/27/2015   Hyperlipidemia 08/06/2015   Persistent atrial fibrillation ([place]) 04/18/2015   Cardiomyopathy, ischemic 04/18/2015   Hypertensive heart disease with heart failure ([place]) 04/15/2015   Anxiety 04/15/2015   Encounter for therapeutic drug monitoring 01/21/2015   Dyspnea on exertion 01/05/2015   Chronic anticoagulation 05/05/2014   Primary insomnia 05/05/2014   Long-term use of high-risk medication 02/05/2014   Hypothyroidism (acquired) 06/14/2013   Localized osteoporosis without current pathological fracture 06/14/2013   Low back pain 06/14/2013   Class 3 obesity ([place]) 01/07/2013   Polypharmacy 01/07/2013   Hypertension 08/01/2011   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

## 2021-12-31 NOTE — Plan of Care (Signed)

## 2021-12-31 NOTE — Progress Notes (Incomplete)
Heart Failure Nurse Navigator Progress Note  PCP: Elisabeth Cara, PA-C PCP-Cardiologist: *** Admission Diagnosis: *** Admitted from: ***  Presentation:   [name] presented with ***  ECHO/ LVEF: ***  Clinical Course:  Past Medical History:  Diagnosis Date   CAD (coronary artery disease)    a. LHC 3/17: mLAD 80, D1 50, pRCA 30, EF 25-35%; PCI: 3.5 x 16 mm Synergy DES to mLAD // Myoview 08/2018: EF 54, normal perfusion; Low Risk   Chronic diastolic CHF 2/59/5638   Echocardiogram 08/2018: EF 55-60, mild septal basal hypertrophy, Gr 2 DD, elevated LVEDP, mild LAE, mild AI   Dilated cardiomyopathy >> EF returned to normal in NSR    Probable Tachycardia Mediated // a. Echo 12/16: Mild concentric LVH, EF 25-30%, anteroseptal akinesis, inferoseptal akinesis, anterior akinesis, trivial AI, mild MR, moderate LAE, trivial TR, trivial PI, PASP 33 mmHg  //  b. Echo 2/17: Mild LVH, EF 25-30%, diffuse HK, mild LAE  //  c. Echo 7/17: mild LVH, EF 50-55%, no RWMA, trivial AI, mild to mod LAE, mild RAE   Febrile seizure ([place]) 2004   "during TTP thing; cause my temp was 106"   High cholesterol    History of blood transfusion 2004   "several; related to TTP"   Hypertension    Hypothyroidism    Morbid obesity ([place]) 01/07/2013   Osteoarthritis of back    Paroxysmal atrial fibrillation ([place]) 08/01/2011   a. 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     Social History   Socioeconomic History   Marital status: Married    Spouse name: Not on file   Number of children: Not on file   Years of education: Not on file   Highest education level: High school graduate  Occupational History   Occupation: retired  Tobacco Use   Smoking status: Never   Smokeless tobacco: Never  Vaping Use    Vaping Use: Never used  Substance and Sexual Activity   Alcohol use: No   Drug use: No   Sexual activity: Never  Other Topics Concern   Not on file  Social History Narrative   Not on file   Social Determinants of Health   Financial Resource Strain: Low Risk  (12/31/2021)   Overall Financial Resource Strain (CARDIA)    Difficulty of Paying Living Expenses: Not very hard  Food Insecurity: No Food Insecurity (12/26/2021)   Hunger Vital Sign    Worried About Running Out of Food in the Last Year: Never true    Ran Out of Food in the Last Year: Never true  Transportation Needs: No Transportation Needs (12/31/2021)   PRAPARE - Hydrologist (Medical): No    Lack of Transportation (Non-Medical): No  Physical Activity: Not on file  Stress: Not on file  Social Connections: Not on file    High Risk Criteria for Readmission and/or Poor Patient Outcomes: Heart failure hospital admissions (last 6 months): ***  No Show rate: *** Difficult social situation: *** Demonstrates medication adherence: *** Primary Language: *** Literacy level: ***  Barriers of Care:   ***  Considerations/Referrals:   Referral made to Heart Failure Pharmacist Stewardship: *** Referral made to Heart Failure CSW/NCM TOC: *** Referral made to Heart & Vascular TOC clinic: ***  Items  for Follow-up on DC/TOC: ***   ***

## 2021-12-31 NOTE — Progress Notes (Signed)
[place] for warfarin  Indication: atrial fibrillation  Allergies  Allergen Reactions   Ceftriaxone Rash and Other (See Comments)    "TTP"/Was hospitalized and in a coma for 60 days, per patient (Thrombotic Thrombocytopenic Purpura)   Penicillins Hives, Rash and Other (See Comments)    Cannot tolerate any "-CILLINS"; causes welts!! Has patient had a PCN reaction causing immediate rash, facial/tongue/throat swelling, SOB or lightheadedness with hypotension: Yes Has patient had a PCN reaction causing severe rash involving mucus membranes or skin necrosis: No Has patient had a PCN reaction that required hospitalization No Has patient had a PCN reaction occurring within the last 10 years: No If all of the above answers are "NO", then may proceed with Cephalosporin use.   Levofloxacin Other (See Comments)    Thrombocytopenia     Levofloxacin Other (See Comments)    Dropped blood platelets extremely low   Oxycodone Itching   Tape Other (See Comments)    Prefers paper or cloth tape Other reaction(s): Other (See Comments) Prefers paper or cloth tape Other reaction(s): Other (See Comments) Prefers paper or cloth tape   Alendronate Rash   Aspirin Other (See Comments)    Patient stated she is unable to take due to blood thinner    Oxycodone-Acetaminophen Itching   Penicillin G Rash    Patient Measurements: Height: '5\' 4"'$  (162.6 cm) Weight: 118.4 kg (261 lb 0.4 oz) IBW/kg (Calculated) : 54.7 Heparin Dosing Weight: 83.2 kg  Vital Signs: Temp: 97.7 F (36.5 C) (12/01 0524) Temp Source: Oral (12/01 0524) BP: 130/85 (12/01 0524) Pulse Rate: 89 (12/01 0524)  Labs: Recent Labs    12/29/21 0335 12/30/21 0649 12/31/21 0249  HGB 13.2 13.5  --   HCT 40.9 41.3  --   PLT 452* 486*  --   LABPROT 35.9* 30.6* 25.6*  INR 3.6* 3.0* 2.4*  CREATININE 0.72 0.85 1.03*     Estimated Creatinine Clearance: 58.8 mL/min (A) (by C-G formula based on SCr  of 1.03 mg/dL (H)).   Medical History: Past Medical History:  Diagnosis Date   CAD (coronary artery disease)    a. LHC 3/17: mLAD 80, D1 50, pRCA 30, EF 25-35%; PCI: 3.5 x 16 mm Synergy DES to mLAD // Myoview 08/2018: EF 54, normal perfusion; Low Risk   Chronic diastolic CHF 5/46/5035   Echocardiogram 08/2018: EF 55-60, mild septal basal hypertrophy, Gr 2 DD, elevated LVEDP, mild LAE, mild AI   Dilated cardiomyopathy >> EF returned to normal in NSR    Probable Tachycardia Mediated // a. Echo 12/16: Mild concentric LVH, EF 25-30%, anteroseptal akinesis, inferoseptal akinesis, anterior akinesis, trivial AI, mild MR, moderate LAE, trivial TR, trivial PI, PASP 33 mmHg  //  b. Echo 2/17: Mild LVH, EF 25-30%, diffuse HK, mild LAE  //  c. Echo 7/17: mild LVH, EF 50-55%, no RWMA, trivial AI, mild to mod LAE, mild RAE   Febrile seizure ([place]) 2004   "during TTP thing; cause my temp was 106"   High cholesterol    History of blood transfusion 2004   "several; related to TTP"   Hypertension    Hypothyroidism    Morbid obesity ([place]) 01/07/2013   Osteoarthritis of back    Paroxysmal atrial fibrillation ([place]) 08/01/2011   a. started on Sotalol during 10/2015 admission (Tikosyn too expensive)   Pneumonia ?1999   Scoliosis    Thyroid nodule    "grew back after 2 partial thyroid surgeries" (09/29/2015)   TTP (  thrombotic thrombocytopenic purpura) ([place]) 05/03/2011    Assessment: 76 yo F on warfarin PTA for afib. Not on DOAC d/t cost issue. Pharmacy consulted to dose warfarin while inpatient.   -INR 2.4 with trend down  PTA regimen: warfarin '5mg'$  QD except 7.'5mg'$  on Tuesdays   Goal of Therapy:  INR 2-3 Monitor platelets by anticoagulation protocol: Yes   Plan:  -Warfarin '5mg'$  today -Daily PT/INR  Hildred Laser, PharmD Clinical Pharmacist **Pharmacist phone directory can now be found on [email or web address] ([email or web address]).  Listed under [place].

## 2021-12-31 NOTE — NC FL2 (Signed)
[place] MEDICAID FL2 LEVEL OF CARE FORM     IDENTIFICATION  Patient Name: [name] Birthdate: [birth year] Sex: female Admission Date (Current Location): 12/26/2021  [facility] and [place] Number:  Herbalist and Address:  [facility], [place] [address] [place], [place] [address]      Provider Number: [id number]  Attending Physician Name and Address:  Tawni Millers,*  Relative Name and Phone Number:  Lenna Sciara (daughter) [phone]    Current Level of Care: Hospital Recommended Level of Care: [place] Prior Approval Number:    Date Approved/Denied:   PASRR Number: [id number]  Discharge Plan: SNF    Current Diagnoses: Patient Active Problem List   Diagnosis Date Noted   CAP (community acquired pneumonia) 12/29/2021   Hyponatremia 12/29/2021   Acute on chronic diastolic CHF (congestive heart failure) ([place]) 12/26/2021   Chest pain 03/30/2021   CAD (coronary artery disease) 03/30/2021   Renal insufficiency 08/16/2020   Cough 10/16/2017   History of splenectomy 07/20/2017   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   Mycoplasma pneumonia 07/06/2017   Postural dizziness with presyncope    Bronchitis 07/05/2017   Chronic diastolic CHF 45/80/9983   Hyperglycemia 09/23/2016   Age-related osteoporosis without current pathological fracture 08/27/2015   Vitamin D deficiency 08/27/2015   Hyperlipidemia 08/06/2015   Persistent atrial fibrillation ([place]) 04/18/2015   Cardiomyopathy, ischemic 04/18/2015   Hypertensive heart disease with heart failure ([place]) 04/15/2015   Anxiety 04/15/2015   Encounter for therapeutic drug monitoring 01/21/2015   Dyspnea on exertion 01/05/2015   Chronic anticoagulation 05/05/2014   Primary insomnia 05/05/2014   Long-term use of high-risk medication 02/05/2014   Hypothyroidism (acquired) 06/14/2013   Localized osteoporosis without current pathological  fracture 06/14/2013   Low back pain 06/14/2013   Class 3 obesity ([place]) 01/07/2013   Polypharmacy 01/07/2013   Hypertension 08/01/2011   Thrombotic thrombocytopenic purpura ([place]) 05/03/2011    Orientation RESPIRATION BLADDER Height & Weight     Self, Time, Situation, Place  O2 (Nasal Cannula 2 liters) Incontinent, External catheter (External Urinary Catheter) Weight: 261 lb 0.4 oz (118.4 kg) Height:  '5\' 4"'$  (162.6 cm)  BEHAVIORAL SYMPTOMS/MOOD NEUROLOGICAL BOWEL NUTRITION STATUS      Incontinent (WDL) Diet (Please see discharge summary)  AMBULATORY STATUS COMMUNICATION OF NEEDS Skin   Extensive Assist Verbally Other (Comment) (Wound/Incision LDAs)                       Personal Care Assistance Level of Assistance  Bathing, Feeding, Dressing Bathing Assistance: Maximum assistance Feeding assistance: Independent Dressing Assistance: Maximum assistance     Functional Limitations Info  Sight, Hearing, Speech Sight Info: Adequate (WDL) Hearing Info: Adequate Speech Info: Adequate    SPECIAL CARE FACTORS FREQUENCY  PT (By licensed PT), OT (By licensed OT)     PT Frequency: 5x min weekly OT Frequency: 5x min weekly            Contractures Contractures Info: Not present    Additional Factors Info  Code Status, Allergies Code Status Info: FULL Allergies Info: Ceftriaxone,Penicillins,Levofloxacin,Levofloxacin,Oxycodone,Tape,Alendronate,Aspirin,Oxycodone-acetaminophen,Penicillin G           Current Medications (12/31/2021):  This is the current hospital active medication list Current Facility-Administered Medications  Medication Dose Route Frequency Provider Last Rate Last Admin   acetaminophen (TYLENOL) tablet 650 mg  650 mg Oral Q6H PRN Marcelyn Bruins, MD       Or   acetaminophen (  TYLENOL) suppository 650 mg  650 mg Rectal Q6H PRN Marcelyn Bruins, MD       albuterol (PROVENTIL) (2.5 MG/3ML) 0.083% nebulizer solution 3 mL  3 mL Nebulization Q4H PRN Marcelyn Bruins, MD       doxycycline (VIBRA-TABS) tablet 100 mg  100 mg Oral Q12H Arrien, Jimmy Picket, MD   100 mg at 12/31/21 1007   furosemide (LASIX) injection 60 mg  60 mg Intravenous Q12H Tawni Millers, MD   60 mg at 12/31/21 0525   guaiFENesin (MUCINEX) 12 hr tablet 600 mg  600 mg Oral BID Domenic Polite, MD   600 mg at 12/31/21 1007   guaiFENesin-dextromethorphan (ROBITUSSIN DM) 100-10 MG/5ML syrup 5 mL  5 mL Oral Q4H PRN Domenic Polite, MD   5 mL at 12/30/21 1124   ipratropium-albuterol (DUONEB) 0.5-2.5 (3) MG/3ML nebulizer solution 3 mL  3 mL Nebulization Q6H Domenic Polite, MD   3 mL at 12/31/21 0834   isosorbide mononitrate (IMDUR) 24 hr tablet 30 mg  30 mg Oral Daily Marcelyn Bruins, MD   30 mg at 12/31/21 1007   levothyroxine (SYNTHROID) tablet 50 mcg  50 mcg Oral Q0600 Wilson Singer I, RPH   50 mcg at 12/31/21 3559   metoprolol succinate (TOPROL-XL) 24 hr tablet 25 mg  25 mg Oral Daily Domenic Polite, MD   25 mg at 12/31/21 1007   mometasone-formoterol (DULERA) 100-5 MCG/ACT inhaler 2 puff  2 puff Inhalation BID Tawni Millers, MD   2 puff at 12/31/21 0834   polyethylene glycol (MIRALAX / GLYCOLAX) packet 17 g  17 g Oral Daily PRN Marcelyn Bruins, MD   17 g at 12/31/21 7416   sodium chloride flush (NS) 0.9 % injection 3 mL  3 mL Intravenous Q12H Marcelyn Bruins, MD   3 mL at 12/31/21 1009   sotalol (BETAPACE) tablet 120 mg  120 mg Oral BID Marcelyn Bruins, MD   120 mg at 12/31/21 1006   spironolactone (ALDACTONE) tablet 25 mg  25 mg Oral Daily Domenic Polite, MD   25 mg at 12/31/21 1007   traZODone (DESYREL) tablet 25 mg  25 mg Oral QHS PRN Mansy, Jan A, MD   25 mg at 12/30/21 2255   warfarin (COUMADIN) tablet 5 mg  5 mg Oral ONCE-1600 Kris Mouton, [facility]       Warfarin - Pharmacist Dosing Inpatient   Does not apply q1600 Carolin [place] [facility]   Given at 12/30/21 1554     Discharge Medications: Please see discharge summary for a  list of discharge medications.  Relevant Imaging Results:  Relevant Lab Results:   Additional Information [phone]  [place] Gain, LCSWA

## 2022-01-01 LAB — BASIC METABOLIC PANEL
Anion gap: 11 (ref 5–15)
BUN: 17 mg/dL (ref 8–23)
CO2: 36 mmol/L — ABNORMAL HIGH (ref 22–32)
Calcium: 8.3 mg/dL — ABNORMAL LOW (ref 8.9–10.3)
Chloride: 87 mmol/L — ABNORMAL LOW (ref 98–111)
Creatinine, Ser: 0.86 mg/dL (ref 0.44–1.00)
GFR, Estimated: >60 mL/min (ref >60)
Glucose, Bld: 107 mg/dL — ABNORMAL HIGH (ref 70–99)
Potassium: 3.8 mmol/L (ref 3.5–5.1)
Sodium: 134 mmol/L — ABNORMAL LOW (ref 135–145)

## 2022-01-01 LAB — PROTIME-INR
INR: 2.1 — ABNORMAL HIGH (ref 0.8–1.2)
Prothrombin Time: 23.2 seconds — ABNORMAL HIGH (ref 11.4–15.2)

## 2022-01-01 MED ORDER — WARFARIN SODIUM 2.5 MG PO TABS
2.5000 mg | ORAL_TABLET | Freq: Once | ORAL | Status: AC
  Administered 2022-01-01: 2.5 mg via ORAL
  Filled 2022-01-01: qty 1

## 2022-01-01 NOTE — Plan of Care (Signed)

## 2022-01-01 NOTE — Progress Notes (Signed)
ANTICOAGULATION CONSULT NOTE - Follow Up Consult  Pharmacy Consult for Warfarin Indication: atrial fibrillation  Allergies  Allergen Reactions   Ceftriaxone Rash and Other (See Comments)    "TTP"/Was hospitalized and in a coma for 60 days, per patient (Thrombotic Thrombocytopenic Purpura)   Penicillins Hives, Rash and Other (See Comments)    Cannot tolerate any "-CILLINS"; causes welts!! Has patient had a PCN reaction causing immediate rash, facial/tongue/throat swelling, SOB or lightheadedness with hypotension: Yes Has patient had a PCN reaction causing severe rash involving mucus membranes or skin necrosis: No Has patient had a PCN reaction that required hospitalization No Has patient had a PCN reaction occurring within the last 10 years: No If all of the above answers are "NO", then may proceed with Cephalosporin use.   Levofloxacin Other (See Comments)    Thrombocytopenia     Levofloxacin Other (See Comments)    Dropped blood platelets extremely low   Oxycodone Itching   Tape Other (See Comments)    Prefers paper or cloth tape Other reaction(s): Other (See Comments) Prefers paper or cloth tape Other reaction(s): Other (See Comments) Prefers paper or cloth tape   Alendronate Rash   Aspirin Other (See Comments)    Patient stated she is unable to take due to blood thinner    Oxycodone-Acetaminophen Itching   Penicillin G Rash    Patient Measurements: Height: '5\' 4"'$  (162.6 cm) Weight: 117.4 kg (258 lb 13.1 oz) IBW/kg (Calculated) : 54.7  Vital Signs: Temp: 98 F (36.7 C) (12/02 1200) Temp Source: Oral (12/02 1200) BP: 110/82 (12/02 1200) Pulse Rate: 104 (12/02 1200)  Labs: Recent Labs    12/30/21 0649 12/31/21 0249 01/01/22 0133  HGB 13.5  --   --   HCT 41.3  --   --   PLT 486*  --   --   LABPROT 30.6* 25.6* 23.2*  INR 3.0* 2.4* 2.1*  CREATININE 0.85 1.03* 0.86    Estimated Creatinine Clearance: 70.1 mL/min (by C-G formula based on SCr of 0.86  mg/dL).  Assessment: 75 yo F on warfarin PTA for atrial fibrillation. Not on DOAC d/t cost issue. Pharmacy consulted to dose warfarin while inpatient.    INR 2.1 today. Remains therapeutic but trended down.  Dose was held on 11/29 with INR up to 3.6, then low dose of 2.5 mg given 11/30, and usual dose 5 mg on 12/1.   PTA regimen: 5 mg daily except 7.5 mg on Tuesdays.  INR 4.4 on admit 11/26.  Goal of Therapy:  INR 2-3 Monitor platelets by anticoagulation protocol: Yes   Plan:  Warfarin 5 mg x 1 again today. Daily PT/INR.  Arty Baumgartner, RPh 01/01/2022,12:37 PM

## 2022-01-01 NOTE — TOC Progression Note (Signed)
Transition of Care [facility]) - Progression Note    Patient Details  Name: [name] MRN: [id number] Date of Birth: 1945/06/30  [facility]) CM/SW [place], LCSW Phone Number: 01/01/2022, 2:16 PM  Clinical Narrative:    Pt has not bed offers.  TOC team will continue to assist with discharge planning needs.    Expected Discharge Plan: Skilled Nursing Facility Barriers to Discharge: Continued Medical Work up  Expected Discharge Plan and Services Expected Discharge Plan: [place] In-house Referral: Clinical Social Work Discharge Planning Services: CM Consult   Living arrangements for the past 2 months: Single Family Home                                       Social Determinants of Health (SDOH) Interventions Transportation Interventions: Intervention Not Indicated Alcohol Usage Interventions: Intervention Not Indicated (Score <7) Financial Strain Interventions: Intervention Not Indicated  Readmission Risk Interventions   No data to display

## 2022-01-01 NOTE — Progress Notes (Signed)
Progress Note   Patient: [name] JKK:[id number] DOB: December 22, 1945 DOA: 12/26/2021     5 DOS: the patient was seen and examined on 01/01/2022   Brief hospital course: [name] was admitted to the hospital with the working diagnosis of heart failure decompensation.   76 yo female with the past medical history of hypertension, atrial fibrillation, diastolic heart failure, hypothyroidism, sp splenectomy and obesity class 3 who presented with dyspnea. Reported 7 days of fever, productive cough, and fatigue. During this time she lowered the dose of furosemide. On her initial physical examination her blood pressure was 121/97, HR 98, RR 24 and 02 saturation 95%, lungs with rales but not wheezing, heart with S1 and S2 present and rhythmic, abdomen with no distention, and positive lower extremity edema.   Na 129, K 3,5 CL 86 bicarbonate 27 glucose 138 bun 10 cr 0,85  BNP 377 Wbc 9,3 hgb 12,8 plt 415  Sars covid 19 negative   Urine analysis SG 1,002, negative protein, 11-20 wbc   Chest radiograph with interstitial infiltrates at lower lobes, more right than left.   EKG 93 bpm, right axis, atrial fibrillation rhythm, with PVC, no significant ST segment or T wave changes.   Patient was placed on furosemide for diuresis.   11/30 slowly improving volume status.  12/01 clinically improving, plan for SNF.  12/02 continue to improve volume status.   Assessment and Plan: * Acute on chronic diastolic CHF (congestive heart failure) (HCC) Echocardiogram with preserved LV systolic function 55 to 71%, D shaped septum in systole and diastole, RVSP 41,9 mmHg. RA with moderate dilatation.   Urine output is documented 2,650 ml (question if accurate)  Systolic blood pressure is 110 to 115 mmHg.   Continue with furosemide 60 mg IV q12 and spironolactone  Hold SGLT inh for now, pyuria  Persistent atrial fibrillation (HCC) Continue rate control with metoprolol and sotalol Anticoagulation with  warfarin.  INR therapeutic.   Hypertension Continue blood pressure control with metoprolol and sotalol.   History of TTP (thrombotic thrombocytopenic purpura) Sp splenectomy. Cell count has been stable.   Hypothyroidism (acquired) Continue levothyroxine   CAP (community acquired pneumonia) Patient with immunosuppression, possible pneumonia, initially on IV Aztreonam and now transitioned to doxycycline.   Airway clearing techniques with flutter valve and incentive spirometer. Continue with bronchodilator therapy and inhaled steroids.   Acute on chronic hypoxemic respiratory failure, 02 saturation is 98% on 3 L/min per [place]. Dyspnea improving, continue supplemental 02 per [place]  Hyponatremia Hypervolemic hyponatremia.   Renal function continue to be stable, her serum cr today is 0,86 with K at 3,8 and serum bicarbonate at 36, Na is 134.   Plan to continue diuresis with furosemide and spironolactone.  Follow up rena function and electrolytes in am.   Class 3 obesity (HCC) Calculated BMI is 44,6         Subjective: Patient with improvement on dyspnea, no chest pain,   Physical Exam: Vitals:   01/01/22 0840 01/01/22 0954 01/01/22 0955 01/01/22 1200  BP:   115/68 110/82  Pulse:  80 99 (!) 104  Resp:   16 16  Temp:   98.3 F (36.8 C) 98 F (36.7 C)  TempSrc:   Oral Oral  SpO2: 90% 90% 90% 90%  Weight:      Height:       Neurology awake and alert ENT with mild pallor Cardiovascular with S1 and S2 present, irregularly irregular with no gallops or rubs Respiratory with mild rales  with no wheezing or rhonchi Abdomen with no distention  Trace non pitting lower extremity edema at the ankles.  Data Reviewed:    Family Communication: I spoke with patient's daughters at the bedside, we talked in detail about patient's condition, plan of care and prognosis and all questions were addressed.    Disposition: Status is: Inpatient Remains inpatient appropriate because:  pending transfer to SNF   Planned Discharge Destination: Home  Author: Tawni Millers, MD 01/01/2022 2:59 PM  For on call review [email or web address].

## 2022-01-02 LAB — BASIC METABOLIC PANEL
Anion gap: 10 (ref 5–15)
BUN: 15 mg/dL (ref 8–23)
CO2: 38 mmol/L — ABNORMAL HIGH (ref 22–32)
Calcium: 8.4 mg/dL — ABNORMAL LOW (ref 8.9–10.3)
Chloride: 85 mmol/L — ABNORMAL LOW (ref 98–111)
Creatinine, Ser: 0.83 mg/dL (ref 0.44–1.00)
GFR, Estimated: >60 mL/min (ref >60)
Glucose, Bld: 101 mg/dL — ABNORMAL HIGH (ref 70–99)
Potassium: 3.4 mmol/L — ABNORMAL LOW (ref 3.5–5.1)
Sodium: 133 mmol/L — ABNORMAL LOW (ref 135–145)

## 2022-01-02 LAB — GLUCOSE, CAPILLARY: Glucose-Capillary: 123 mg/dL — ABNORMAL HIGH (ref 70–99)

## 2022-01-02 LAB — PROTIME-INR
INR: 1.8 — ABNORMAL HIGH (ref 0.8–1.2)
Prothrombin Time: 20.4 seconds — ABNORMAL HIGH (ref 11.4–15.2)

## 2022-01-02 MED ORDER — FUROSEMIDE 40 MG PO TABS
60.0000 mg | ORAL_TABLET | Freq: Every day | ORAL | Status: DC
  Administered 2022-01-03: 60 mg via ORAL
  Filled 2022-01-02: qty 1

## 2022-01-02 MED ORDER — POTASSIUM CHLORIDE CRYS ER 20 MEQ PO TBCR
40.0000 meq | EXTENDED_RELEASE_TABLET | ORAL | Status: AC
  Administered 2022-01-02 (×2): 40 meq via ORAL
  Filled 2022-01-02 (×2): qty 2

## 2022-01-02 MED ORDER — NITROGLYCERIN 0.4 MG SL SUBL
SUBLINGUAL_TABLET | SUBLINGUAL | Status: AC
  Administered 2022-01-02: 0.4 mg
  Filled 2022-01-02: qty 1

## 2022-01-02 MED ORDER — WARFARIN SODIUM 5 MG PO TABS
6.0000 mg | ORAL_TABLET | Freq: Once | ORAL | Status: AC
  Administered 2022-01-02: 6 mg via ORAL
  Filled 2022-01-02: qty 1

## 2022-01-02 NOTE — TOC Progression Note (Addendum)
Transition of Care [facility]) - Progression Note    Patient Details  Name: [name] MRN: [id number] Date of Birth: May 06, 1945  Transition [facility] Care [facility]) CM/SW [place], [place] Phone Number: 01/02/2022, 11:22 AM  Clinical Narrative:      CSW called [facility] to see if they can offer SNF bed for patient. CSW was informed no one to speak with in admissions today . CSW LVM for Graceanne with admissions. CSW to follow back up in the morning to see if they can offer SNF bed for patient. [facility] is patients first choice for SNF placement. CSW will continue to follow and assist with patients dc planning needs.  Update- CSW informed patient will follow up with Graceanne from [facility] in the morning to see if they can offer SNF bed for patient. CSW informed patient she did get an offer with [facility] in [place]. Patient politely declined SNF bed offer. CSW informed patient will follow back up with her in the morning after speaking with Graceanne with [facility]. CSW informed MD. CSW started insurance authorization for patient. [place] ID# [id number]. Once patient confirms SNF choice . CSW will add facility to patients insurance authorization.CSW will continue to follow.  Expected Discharge Plan: Skilled Nursing Facility Barriers to Discharge: Continued Medical Work up  Expected Discharge Plan and Services Expected Discharge Plan: [place] In-house Referral: Clinical Social Work Discharge Planning Services: CM Consult   Living arrangements for the past 2 months: Single Family Home                                       Social Determinants of Health (SDOH) Interventions Transportation Interventions: Intervention Not Indicated Alcohol Usage Interventions: Intervention Not Indicated (Score <7) Financial Strain Interventions: Intervention Not Indicated  Readmission Risk Interventions     No data to display

## 2022-01-02 NOTE — Progress Notes (Signed)
Progress Note   Patient: [name] XVQ:[id number] DOB: April 15, 1945 DOA: 12/26/2021     6 DOS: the patient was seen and examined on 01/02/2022   Brief hospital course: Mrs. Crothers was admitted to the hospital with the working diagnosis of heart failure decompensation.   76 yo female with the past medical history of hypertension, atrial fibrillation, diastolic heart failure, hypothyroidism, sp splenectomy and obesity class 3 who presented with dyspnea. Reported 7 days of fever, productive cough, and fatigue. During this time she lowered the dose of furosemide. On her initial physical examination her blood pressure was 121/97, HR 98, RR 24 and 02 saturation 95%, lungs with rales but not wheezing, heart with S1 and S2 present and rhythmic, abdomen with no distention, and positive lower extremity edema.   Na 129, K 3,5 CL 86 bicarbonate 27 glucose 138 bun 10 cr 0,85  BNP 377 Wbc 9,3 hgb 12,8 plt 415  Sars covid 19 negative   Urine analysis SG 1,002, negative protein, 11-20 wbc   Chest radiograph with interstitial infiltrates at lower lobes, more right than left.   EKG 93 bpm, right axis, atrial fibrillation rhythm, with PVC, no significant ST segment or T wave changes.   Patient was placed on furosemide for diuresis.   11/30 slowly improving volume status.  12/01 clinically improving, plan for SNF.  12/02 continue to improve volume status.  12/03 Patient pending transfer to SNF.   Assessment and Plan: * Acute on chronic diastolic CHF (congestive heart failure) (HCC) Echocardiogram with preserved LV systolic function 55 to 09%, D shaped septum in systole and diastole, RVSP 41,9 mmHg. RA with moderate dilatation.   Urine output is documented 2,550 ml (question if accurate)  Systolic blood pressure is 123 to 115 mmHg.   Continue with spironolactone and transition to po furosemide 60 mg daily.   Hold SGLT inh for now, pyuria Addition of ARB when blood pressure more stable.    Persistent atrial fibrillation (HCC) Continue rate control with metoprolol and sotalol Anticoagulation with warfarin.  INR therapeutic.   Hypertension Continue blood pressure control with metoprolol and sotalol.   History of TTP (thrombotic thrombocytopenic purpura) Sp splenectomy. Cell count has been stable.   Hypothyroidism (acquired) Continue levothyroxine   CAP (community acquired pneumonia) Patient with immunosuppression, possible pneumonia, initially on IV Aztreonam and now transitioned to doxycycline.   Airway clearing techniques with flutter valve and incentive spirometer. Continue with bronchodilator therapy and inhaled steroids.   Acute on chronic hypoxemic respiratory failure, 02 saturation is 98% on 3 L/min per [place]. Dyspnea improving, continue supplemental 02 per [place]  Hyponatremia Hypervolemic hyponatremia.   Patient with improvement in her volume status, renal function with serum cr at 0,83 with K at 3,4 and serum bicarbonate at 38.  Plan to continue diuresis with furosemide (transition to po) and spironolactone.  Add 80 meq Kcl in 2 divided doses and follow up renal function and electrolytes in am.   Class 3 obesity (HCC) Calculated BMI is 44,6         Subjective: Patient with had transitory chest/ abdominal pain today, her dyspnea has been improving, she continue to be very weak and deconditioned.   Physical Exam: Vitals:   01/02/22 0613 01/02/22 0846 01/02/22 0909 01/02/22 1228  BP: 115/87  128/80 123/80  Pulse: 98 97 84 93  Resp: '17 18 18 18  '$ Temp: 97.7 F (36.5 C)  98 F (36.7 C) 98.4 F (36.9 C)  TempSrc: Oral  Oral Oral  SpO2: 94% 91% 92% 97%  Weight: 114.9 kg     Height:       Neurology awake and alert ENT with mild pallor Cardiovascular with S1 and S2 present, irregularly irregular with no gallops, or rubs, positive systolic murmur at the apex No JVD No ankle edema Respiratory with scattered rhonchi, but not wheezing or  rales Abdomen with no distention, protuberant but not tender  Data Reviewed:    Family Communication: no family at the bedside   Disposition: Status is: Inpatient Remains inpatient appropriate because: pending transfer to SNF   Planned Discharge Destination: Skilled nursing facility      Author: Tawni Millers, MD 01/02/2022 2:59 PM  For on call review [email or web address].

## 2022-01-02 NOTE — IPAL (Addendum)
I spoke with Mrs. Francom about her current condition of heart failure, complicated with atrial fibrillation. She has very poor mobility due to back pain.  Addressed advance directives. In case of cardiac arrest chances of recovery are very low and in case of survival to ACLS maneuvers, likely her quality of life will be very poor. She has decided to change code status to DNR.   I called her daughter Lenna Sciara and updated her on change in code status.  All questions have been addressed.

## 2022-01-02 NOTE — Plan of Care (Signed)

## 2022-01-02 NOTE — Progress Notes (Signed)
ANTICOAGULATION CONSULT NOTE - Follow Up Consult  Pharmacy Consult for Warfarin Indication: atrial fibrillation  Allergies  Allergen Reactions   Ceftriaxone Rash and Other (See Comments)    "TTP"/Was hospitalized and in a coma for 60 days, per patient (Thrombotic Thrombocytopenic Purpura)   Penicillins Hives, Rash and Other (See Comments)    Cannot tolerate any "-CILLINS"; causes welts!! Has patient had a PCN reaction causing immediate rash, facial/tongue/throat swelling, SOB or lightheadedness with hypotension: Yes Has patient had a PCN reaction causing severe rash involving mucus membranes or skin necrosis: No Has patient had a PCN reaction that required hospitalization No Has patient had a PCN reaction occurring within the last 10 years: No If all of the above answers are "NO", then may proceed with Cephalosporin use.   Levofloxacin Other (See Comments)    Thrombocytopenia     Levofloxacin Other (See Comments)    Dropped blood platelets extremely low   Oxycodone Itching   Tape Other (See Comments)    Prefers paper or cloth tape Other reaction(s): Other (See Comments) Prefers paper or cloth tape Other reaction(s): Other (See Comments) Prefers paper or cloth tape   Alendronate Rash   Aspirin Other (See Comments)    Patient stated she is unable to take due to blood thinner    Oxycodone-Acetaminophen Itching   Penicillin G Rash    Patient Measurements: Height: '5\' 4"'$  (162.6 cm) Weight: 114.9 kg (253 lb 4.9 oz) IBW/kg (Calculated) : 54.7  Vital Signs: Temp: 98 F (36.7 C) (12/03 0909) Temp Source: Oral (12/03 0909) BP: 128/80 (12/03 0909) Pulse Rate: 84 (12/03 0909)  Labs: Recent Labs    12/31/21 0249 01/01/22 0133 01/02/22 0737  LABPROT 25.6* 23.2* 20.4*  INR 2.4* 2.1* 1.8*  CREATININE 1.03* 0.86 0.83     Estimated Creatinine Clearance: 71.7 mL/min (by C-G formula based on SCr of 0.83 mg/dL).  Assessment: 76 yo F on warfarin PTA for atrial fibrillation.  Not on DOAC d/t cost issue. Pharmacy consulted to dose warfarin while inpatient. INR 4.4 on admit 11/26 and warfarin held x 2 days.   INR 1.8, trended down again. Dose was held on 11/29 with INR up to 3.6, then low dose of 2.5 mg given 11/30, and usual dose 5 mg on 12/1. Intended 5 mg dose on 12/2, but realized today that I placed order for 2.5 mg dose.    PTA regimen: 5 mg daily except 7.5 mg on Tuesdays.  INR 4.4 on admit 11/26.  Goal of Therapy:  INR 2-3 Monitor platelets by anticoagulation protocol: Yes   Plan:  Warfarin 6 mg x 1 today. Daily PT/INR.  Arty Baumgartner, RPh 01/02/2022,11:05 AM

## 2022-01-03 LAB — BASIC METABOLIC PANEL
Anion gap: 9 (ref 5–15)
BUN: 20 mg/dL (ref 8–23)
CO2: 37 mmol/L — ABNORMAL HIGH (ref 22–32)
Calcium: 8.7 mg/dL — ABNORMAL LOW (ref 8.9–10.3)
Chloride: 90 mmol/L — ABNORMAL LOW (ref 98–111)
Creatinine, Ser: 0.78 mg/dL (ref 0.44–1.00)
GFR, Estimated: >60 mL/min (ref >60)
Glucose, Bld: 106 mg/dL — ABNORMAL HIGH (ref 70–99)
Potassium: 4.6 mmol/L (ref 3.5–5.1)
Sodium: 136 mmol/L (ref 135–145)

## 2022-01-03 LAB — PROTIME-INR
INR: 1.5 — ABNORMAL HIGH (ref 0.8–1.2)
Prothrombin Time: 17.8 seconds — ABNORMAL HIGH (ref 11.4–15.2)

## 2022-01-03 MED ORDER — WARFARIN SODIUM 5 MG PO TABS: 5.0000 mg | ORAL_TABLET | Freq: Every day | ORAL | 0 refills | Status: DC

## 2022-01-03 MED ORDER — WARFARIN SODIUM 5 MG PO TABS: 6.0000 mg | ORAL_TABLET | Freq: Once | ORAL | Status: DC

## 2022-01-03 MED ORDER — MOMETASONE FURO-FORMOTEROL FUM 100-5 MCG/ACT IN AERO: 2.0000 | INHALATION_SPRAY | Freq: Two times a day (BID) | RESPIRATORY_TRACT | 0 refills | Status: AC

## 2022-01-03 MED ORDER — POLYETHYLENE GLYCOL 3350 17 G PO PACK: 17.0000 g | PACK | Freq: Every day | ORAL | 0 refills | Status: AC | PRN

## 2022-01-03 MED ORDER — SPIRONOLACTONE 25 MG PO TABS: 25.0000 mg | ORAL_TABLET | Freq: Every day | ORAL | 0 refills | Status: DC

## 2022-01-03 MED ORDER — FUROSEMIDE 20 MG PO TABS: 60.0000 mg | ORAL_TABLET | Freq: Every day | ORAL | 0 refills | Status: DC

## 2022-01-03 MED ORDER — WARFARIN SODIUM 7.5 MG PO TABS
7.5000 mg | ORAL_TABLET | Freq: Once | ORAL | Status: AC
  Administered 2022-01-03: 7.5 mg via ORAL
  Filled 2022-01-03: qty 1

## 2022-01-03 MED ORDER — ACETAMINOPHEN 325 MG PO TABS: 650.0000 mg | ORAL_TABLET | Freq: Four times a day (QID) | ORAL | Status: AC | PRN

## 2022-01-03 MED ORDER — WARFARIN SODIUM 7.5 MG PO TABS: 7.5000 mg | ORAL_TABLET | Freq: Once | ORAL | Status: DC

## 2022-01-03 NOTE — Progress Notes (Addendum)
ANTICOAGULATION CONSULT NOTE - Follow Up Consult  Pharmacy Consult for Warfarin Indication: atrial fibrillation  Allergies  Allergen Reactions   Ceftriaxone Rash and Other (See Comments)    "TTP"/Was hospitalized and in a coma for 60 days, per patient (Thrombotic Thrombocytopenic Purpura)   Penicillins Hives, Rash and Other (See Comments)    Cannot tolerate any "-CILLINS"; causes welts!! Has patient had a PCN reaction causing immediate rash, facial/tongue/throat swelling, SOB or lightheadedness with hypotension: Yes Has patient had a PCN reaction causing severe rash involving mucus membranes or skin necrosis: No Has patient had a PCN reaction that required hospitalization No Has patient had a PCN reaction occurring within the last 10 years: No If all of the above answers are "NO", then may proceed with Cephalosporin use.   Levofloxacin Other (See Comments)    Thrombocytopenia     Levofloxacin Other (See Comments)    Dropped blood platelets extremely low   Oxycodone Itching   Tape Other (See Comments)    Prefers paper or cloth tape Other reaction(s): Other (See Comments) Prefers paper or cloth tape Other reaction(s): Other (See Comments) Prefers paper or cloth tape   Alendronate Rash   Aspirin Other (See Comments)    Patient stated she is unable to take due to blood thinner    Oxycodone-Acetaminophen Itching   Penicillin G Rash    Patient Measurements: Height: '5\' 4"'$  (162.6 cm) Weight: 114.4 kg (252 lb 3.3 oz) IBW/kg (Calculated) : 54.7  Vital Signs: Temp: 97.6 F (36.4 C) (12/04 0516) Temp Source: Oral (12/04 0516) BP: 124/76 (12/04 0516) Pulse Rate: 80 (12/04 0516)  Labs: Recent Labs    01/01/22 0133 01/02/22 0737 01/03/22 0546  LABPROT 23.2* 20.4* 17.8*  INR 2.1* 1.8* 1.5*  CREATININE 0.86 0.83 0.78     Estimated Creatinine Clearance: 74.2 mL/min (by C-G formula based on SCr of 0.78 mg/dL).  Assessment: 76 yo F on warfarin PTA for atrial fibrillation.  Not on DOAC d/t cost issue. Pharmacy consulted to dose warfarin while inpatient. INR 4.4 on admit 11/26 and warfarin held x 2 days.   INR is subtherapeutic at 1.5, trended down again. Last CBC stable on 11/30. No s/sx of bleeding.    PTA regimen: 5 mg daily except 7.5 mg on Tuesdays.  INR 4.4 on admit 11/26.  Goal of Therapy:  INR 2-3 Monitor platelets by anticoagulation protocol: Yes   Plan:  Warfarin 7.5 mg tonight  Daily PT/INR, CBC, and for s/sx of bleeding If plan for discharge: will do 7.5 mg tonight (12/4) then plan to discharge on 5 mg daily with close INR monitoring at SNF given supratherapeutic INR on admit    Antonietta Jewel, PharmD, [place] Pharmacist  Phone: [phone] 01/03/2022 10:03 AM  Please check AMION for all [place] phone numbers After 10:00 PM, call [place] [phone]

## 2022-01-03 NOTE — Discharge Summary (Signed)
Physician Discharge Summary   Patient: [name] MRN: [id number] DOB: November 07, 1945  Admit date:     12/26/2021  Discharge date: 01/03/22  Discharge Physician: Jimmy Picket Gazella Anglin   PCP: Belva Bertin, [place], [place]   Recommendations at discharge:    Patient will continue diuresis with furosemide 60 mg po daily Added spironolactone with good toleration. Follow up renal function and electrolytes in 7 days. Follow up with [place] in 7 to 10 days.  Follow up INR on 01/05/22, target 2 to 3.   I spoke with patient's daughters at the bedside, we talked in detail about patient's condition, plan of care and prognosis and all questions were addressed.   Discharge Diagnoses: Principal Problem:   Acute on chronic diastolic CHF (congestive heart failure) (HCC) Active Problems:   Persistent atrial fibrillation (HCC)   Hypertension   History of TTP (thrombotic thrombocytopenic purpura)   Hypothyroidism (acquired)   CAP (community acquired pneumonia)   Hyponatremia   Class 3 obesity ([place])  Resolved Problems:   * No resolved hospital problems. [facility] Course: [name] was admitted to the hospital with the working diagnosis of heart failure decompensation and pneumonia/ tracheobronchitis.   76 yo female with the past medical history of hypertension, atrial fibrillation, diastolic heart failure, hypothyroidism, sp splenectomy and obesity class 3 who presented with dyspnea. Reported 7 days of fever, productive cough, and fatigue. During this time she lowered the dose of furosemide.  At home patient has very limited mobility, she had a back injury about 3 years ago.  On her initial physical examination her blood pressure was 121/97, HR 98, RR 24 and 02 saturation 95%, lungs with rales but not wheezing, heart with S1 and S2 present and rhythmic, abdomen with no distention, and positive lower extremity edema.   Na 129, K 3,5 CL 86 bicarbonate 27 glucose 138 bun 10 cr  0,85  BNP 377 Wbc 9,3 hgb 12,8 plt 415  Sars covid 19 negative   Urine analysis SG 1,002, negative protein, 11-20 wbc   Chest radiograph with interstitial infiltrates at lower lobes, more right than left.   EKG 93 bpm, right axis, atrial fibrillation rhythm, with PVC, no significant ST segment or T wave changes.   Patient was placed on furosemide for diuresis, along with bronchodilator therapy and antibiotics.   Echocardiogram with preserved LV systolic function, but positive RV failure.   11/30 slowly improving volume status.  12/01 clinically improving, plan for SNF.  12/02 continue to improve volume status along with dyspnea.  12/03 Patient pending transfer to SNF, code status changed to DNR. 12/04 transfer to SNF.   Assessment and Plan: * Acute on chronic diastolic CHF (congestive heart failure) (HCC) Echocardiogram with preserved LV systolic function 55 to 71%, D shaped septum in systole and diastole, RVSP 41,9 mmHg. RA with moderate dilatation.   Patient underwent aggressive diuresis with furosemide, negative fluid balance was achieved, -12,060 ml with significant improvement in her symptoms.   At the time of her discharge she will continue diuresis with spironolactone and po furosemide 60 mg daily.   Hold SGLT inh for now, pyuria Addition of ARB when blood pressure more stable.   Persistent atrial fibrillation (HCC) Continue rate control with metoprolol and sotalol Anticoagulation with warfarin.  INR is 1,5 at the time of her discharge.  Plan to follow up INR in 48 hrs, target INR is 2 to 3.    Hypertension Continue blood pressure control with metoprolol and sotalol.  History of TTP (thrombotic thrombocytopenic purpura) Sp splenectomy. Cell count has been stable.   Hypothyroidism (acquired) Continue levothyroxine   CAP (community acquired pneumonia) Present on admission.  Patient with immunosuppression, possible pneumonia, initially on IV Aztreonam and then  transitioned to doxycycline.   Airway clearing techniques with flutter valve and incentive spirometer. Continue with bronchodilator therapy and inhaled steroids.   Acute on chronic hypoxemic respiratory failure, currently 02 saturation is 98% on 2 L/min per [place].  Hyponatremia Hypervolemic hyponatremia.   Patient tolerated well diuresis, at the time of her discharge her volume status has improved, her serum cr is 0,78, K is 4,6 and serum bicarbonate is 37.  Na 136   Continue diuresis with furosemide and spironolactone.  Continue Kcl supplementation and follow up renal function and electrolytes in 7 days.   Class 3 obesity (HCC) Calculated BMI is 44,6          Consultants: none  Procedures performed: none  Disposition: Skilled nursing facility Diet recommendation:  Cardiac diet DISCHARGE MEDICATION: Allergies as of 01/03/2022       Reactions   Ceftriaxone Rash, Other (See Comments)   "TTP"/Was hospitalized and in a coma for 60 days, per patient (Thrombotic Thrombocytopenic Purpura)   Penicillins Hives, Rash, Other (See Comments)   Cannot tolerate any "-CILLINS"; causes welts!! Has patient had a PCN reaction causing immediate rash, facial/tongue/throat swelling, SOB or lightheadedness with hypotension: Yes Has patient had a PCN reaction causing severe rash involving mucus membranes or skin necrosis: No Has patient had a PCN reaction that required hospitalization No Has patient had a PCN reaction occurring within the last 10 years: No If all of the above answers are "NO", then may proceed with Cephalosporin use.   Levofloxacin Other (See Comments)   Thrombocytopenia    Levofloxacin Other (See Comments)   Dropped blood platelets extremely low   Oxycodone Itching   Tape Other (See Comments)   Prefers paper or cloth tape Other reaction(s): Other (See Comments) Prefers paper or cloth tape Other reaction(s): Other (See Comments) Prefers paper or cloth tape   Alendronate Rash    Aspirin Other (See Comments)   Patient stated she is unable to take due to blood thinner   Oxycodone-acetaminophen Itching   Penicillin G Rash        Medication List     STOP taking these medications    nitroGLYCERIN 0.4 MG SL tablet Commonly known as: NITROSTAT       TAKE these medications    acetaminophen 325 MG tablet Commonly known as: TYLENOL Take 2 tablets (650 mg total) by mouth every 6 (six) hours as needed for mild pain (or Fever >/= 101).   albuterol 0.63 MG/3ML nebulizer solution Commonly known as: ACCUNEB Take 3 mLs by nebulization every 4 (four) hours as needed for wheezing or shortness of breath.   furosemide 20 MG tablet Commonly known as: LASIX Take 3 tablets (60 mg total) by mouth daily. Start taking on: January 04, 2022 What changed:  medication strength how much to take additional instructions   isosorbide mononitrate 30 MG 24 hr tablet Commonly known as: IMDUR TAKE 1 TABLET BY MOUTH EVERY DAY   levothyroxine 50 MCG tablet Commonly known as: SYNTHROID Take 50 mcg by mouth daily before breakfast.   meclizine 25 MG tablet Commonly known as: ANTIVERT Take 12.5-25 mg by mouth 3 (three) times daily as needed for dizziness.   metoprolol succinate 25 MG 24 hr tablet Commonly known as: TOPROL-XL TAKE 1 TABLET  BY MOUTH EVERY DAY WITH OR IMMEDIATELY FOLLOWING A MEAL What changed: See the new instructions.   mometasone-formoterol 100-5 MCG/ACT Aero Commonly known as: DULERA Inhale 2 puffs into the lungs 2 (two) times daily.   polyethylene glycol 17 g packet Commonly known as: MIRALAX / GLYCOLAX Take 17 g by mouth daily as needed for mild constipation.   potassium chloride 10 MEQ tablet Commonly known as: KLOR-CON M Take 10 mEq by mouth daily.   sotalol 120 MG tablet Commonly known as: BETAPACE TAKE 1 TABLET BY MOUTH TWICE A DAY   spironolactone 25 MG tablet Commonly known as: ALDACTONE Take 1 tablet (25 mg total) by mouth  daily. Start taking on: January 04, 2022   Vitamin D3 50 MCG (2000 UT) capsule Take 2,000 Units by mouth every morning.   warfarin 5 MG tablet Commonly known as: Coumadin Take as directed. If you are unsure how to take this medication, talk to your nurse or doctor. Original instructions: Take 1 tablet (5 mg total) by mouth daily. What changed: See the new instructions.               Durable Medical Equipment  (From admission, onward)           Start     Ordered   12/30/21 1145  For home use only DME standard manual wheelchair with seat cushion  Once       Comments: Patient suffers from ambulatory dysfunction  which impairs their ability to perform daily activities like bathing in the home.  A walker will not resolve issue with performing activities of daily living. A wheelchair will allow patient to safely perform daily activities. Patient can safely propel the wheelchair in the home or has a caregiver who can provide assistance. Length of need 12 months . Accessories: elevating leg rests (ELRs), wheel locks, extensions and anti-tippers.   12/30/21 1144            Contact information for after-discharge care     Destination     [facility] [place] [facility] .   Service: Skilled Nursing Contact information: [address] [place] [phone]                    Discharge Exam: Danley Danker Weights   01/01/22 0544 01/02/22 0613 01/03/22 0516  Weight: 117.4 kg 114.9 kg 114.4 kg   BP 111/63 (BP Location: Left Wrist)   Pulse 88   Temp 97.6 F (36.4 C) (Oral)   Resp 18   Ht '5\' 4"'$  (1.626 m)   Wt 114.4 kg   SpO2 98%   BMI 43.29 kg/m   Patient with no chest pain, dyspnea continue to improve, no nausea or vomiting.   Neurology awake and alert ENT with mild pallor Cardiovascular with S1 and S2 present, irregularly irregular with no gallops, rubs or murmurs Respiratory with no rales or  wheezing Abdomen with no distention No ankle edema   Condition at discharge: stable  The results of significant diagnostics from this hospitalization (including imaging, microbiology, ancillary and laboratory) are listed below for reference.   Imaging Studies: DG Chest 2 View  Result Date: 12/28/2021 CLINICAL DATA:  Cough and weakness, shortness of breath EXAM: CHEST - 2 VIEW COMPARISON:  Radiograph 02/25/2021 FINDINGS: Unchanged enlarged cardiac silhouette. There are diffuse interstitial opacities and new alveolar opacities throughout the right lung. Small left pleural effusion. No evidence of pneumothorax. Bones are unchanged with thoracic spondylosis and  bilateral shoulder osteoarthritis. There are multiple osteochondral bodies overlying the right shoulder. IMPRESSION: New airspace opacities in the right lung concerning for multifocal pneumonia or worsening asymmetric pulmonary edema. Unchanged cardiomegaly. Electronically Signed   By: Maurine Simmering M.D.   On: 12/28/2021 08:39   ECHOCARDIOGRAM COMPLETE  Result Date: 12/27/2021    ECHOCARDIOGRAM REPORT   Patient Name:   [name] Date of Exam: 12/27/2021 Medical Rec #:  [id number]          Height:       64.0 in Accession #:    [id number]         Weight:       263.9 lb Date of Birth:  02/04/1945          BSA:          2.201 m Patient Age:    72 years           BP:           160/129 mmHg Patient Gender: F                  HR:           95 bpm. Exam Location:  Inpatient Procedure: 2D Echo, Cardiac Doppler and Color Doppler Indications:    dyspnea  History:        Patient has prior history of Echocardiogram examinations, most                 recent 03/31/2021. CHF, Arrythmias:Atrial Fibrillation; Risk                 Factors:Hypertension.  Sonographer:    Johny Chess RDCS Referring Phys: [id number] [place]  1. Left ventricular ejection fraction, by estimation, is 55 to 60%. The left ventricle has normal function. The left  ventricle has no regional wall motion abnormalities. Left ventricular diastolic parameters are indeterminate.  2. D-shaped septum suggestive of RV pressure/volume overload. Right ventricular systolic function is moderately reduced. The right ventricular size is mildly enlarged. There is mildly elevated pulmonary artery systolic pressure. The estimated right ventricular systolic pressure is 96.2 mmHg.  3. Left atrial size was mild to moderately dilated.  4. Right atrial size was moderately dilated.  5. The mitral valve is normal in structure. Mild mitral valve regurgitation. No evidence of mitral stenosis.  6. The aortic valve is tricuspid. There is mild calcification of the aortic valve. Aortic valve regurgitation is trivial. No aortic stenosis is present.  7. The inferior vena cava is dilated in size with >50% respiratory variability, suggesting right atrial pressure of 8 mmHg.  8. The patient was in atrial fibrillation. FINDINGS  Left Ventricle: Left ventricular ejection fraction, by estimation, is 55 to 60%. The left ventricle has normal function. The left ventricle has no regional wall motion abnormalities. The left ventricular internal cavity size was normal in size. There is  no left ventricular hypertrophy. Left ventricular diastolic parameters are indeterminate. Right Ventricle: D-shaped septum suggestive of RV pressure/volume overload. The right ventricular size is mildly enlarged. No increase in right ventricular wall thickness. Right ventricular systolic function is moderately reduced. There is mildly elevated pulmonary artery systolic pressure. The tricuspid regurgitant velocity is 2.90 m/s, and with an assumed right atrial pressure of 8 mmHg, the estimated right ventricular systolic pressure is 95.2 mmHg. Left Atrium: Left atrial size was mild to moderately dilated. Right Atrium: Right atrial size was moderately dilated. Pericardium: Trivial pericardial effusion is present. Mitral Valve:  The mitral  valve is normal in structure. Mild mitral annular calcification. Mild mitral valve regurgitation. No evidence of mitral valve stenosis. Tricuspid Valve: The tricuspid valve is normal in structure. Tricuspid valve regurgitation is trivial. Aortic Valve: The aortic valve is tricuspid. There is mild calcification of the aortic valve. Aortic valve regurgitation is trivial. No aortic stenosis is present. Pulmonic Valve: The pulmonic valve was normal in structure. Pulmonic valve regurgitation is trivial. Aorta: The aortic root is normal in size and structure. Venous: The inferior vena cava is dilated in size with greater than 50% respiratory variability, suggesting right atrial pressure of 8 mmHg. IAS/Shunts: No atrial level shunt detected by color flow Doppler.  LEFT VENTRICLE PLAX 2D LVIDd:         4.00 cm LVIDs:         3.10 cm LV PW:         0.90 cm LV IVS:        1.10 cm LVOT diam:     1.90 cm LVOT Area:     2.84 cm  IVC IVC diam: 2.10 cm LEFT ATRIUM             Index        RIGHT ATRIUM           Index LA diam:        4.20 cm 1.91 cm/m   RA Area:     22.00 cm LA Vol (A2C):   60.1 ml 27.31 ml/m  RA Volume:   66.50 ml  30.21 ml/m LA Vol (A4C):   87.2 ml 39.62 ml/m LA Biplane Vol: 79.9 ml 36.30 ml/m   AORTA Ao Root diam: 2.80 cm Ao Asc diam:  3.40 cm TRICUSPID VALVE TR Peak grad:   33.6 mmHg TR Vmax:        290.00 cm/s  SHUNTS Systemic Diam: 1.90 cm Dalton McleanMD Electronically signed by Franki Monte Signature Date/Time: 12/27/2021/1:04:21 PM    Final    DG CHEST PORT 1 VIEW  Result Date: 12/26/2021 CLINICAL DATA:  Shortness of breath EXAM: PORTABLE CHEST 1 VIEW COMPARISON:  03/30/2021 FINDINGS: 1135 hours. The cardio pericardial silhouette is enlarged. Interstitial markings are diffusely coarsened with chronic features. The lungs are clear without focal pneumonia, edema, pneumothorax or pleural effusion. Bones are diffusely demineralized. Telemetry leads overlie the chest. IMPRESSION: Chronic  interstitial coarsening with cardiomegaly. Component of mild interstitial edema not excluded. Electronically Signed   By: Misty Stanley M.D.   On: 12/26/2021 11:54    Microbiology: Results for orders placed or performed during the hospital encounter of 12/26/21  Culture, blood (routine x 2)     Status: None   Collection Time: 12/26/21 12:03 PM   Specimen: BLOOD  Result Value Ref Range Status   Specimen Description BLOOD SITE NOT SPECIFIED  Final   Special Requests   Final    BOTTLES DRAWN AEROBIC AND ANAEROBIC Blood Culture results may not be optimal due to an inadequate volume of blood received in culture bottles   Culture   Final    NO GROWTH 5 DAYS Performed at [facility], [place] [address], [place], [place] [address]    Report Status 12/31/2021 FINAL  Final  Resp Panel by RT-PCR (Flu A&B, Covid) Anterior Nasal Swab     Status: None   Collection Time: 12/26/21 12:21 PM   Specimen: Anterior Nasal Swab  Result Value Ref Range Status   SARS Coronavirus 2 by RT PCR NEGATIVE NEGATIVE Final  Comment: (NOTE) SARS-CoV-2 target nucleic acids are NOT DETECTED.  The SARS-CoV-2 RNA is generally detectable in upper respiratory specimens during the acute phase of infection. The lowest concentration of SARS-CoV-2 viral copies this assay can detect is 138 copies/mL. A negative result does not preclude SARS-Cov-2 infection and should not be used as the sole basis for treatment or other patient management decisions. A negative result may occur with  improper specimen collection/handling, submission of specimen other than nasopharyngeal swab, presence of viral mutation(s) within the areas targeted by this assay, and inadequate number of viral copies(<138 copies/mL). A negative result must be combined with clinical observations, patient history, and epidemiological information. The expected result is Negative.  Fact Sheet for Patients:  EntrepreneurPulse.com.au  Fact  Sheet for Healthcare Providers:  IncredibleEmployment.be  This test is no t yet approved or cleared by the [place] FDA and  has been authorized for detection and/or diagnosis of SARS-CoV-2 by FDA under an Emergency Use Authorization (EUA). This EUA will remain  in effect (meaning this test can be used) for the duration of the COVID-19 declaration under Section 564(b)(1) of the Act, 21 U.S.C.section 360bbb-3(b)(1), unless the authorization is terminated  or revoked sooner.       Influenza A by PCR NEGATIVE NEGATIVE Final   Influenza B by PCR NEGATIVE NEGATIVE Final    Comment: (NOTE) The Xpert Xpress SARS-CoV-2/FLU/RSV plus assay is intended as an aid in the diagnosis of influenza from Nasopharyngeal swab specimens and should not be used as a sole basis for treatment. Nasal washings and aspirates are unacceptable for Xpert Xpress SARS-CoV-2/FLU/RSV testing.  Fact Sheet for Patients: EntrepreneurPulse.com.au  Fact Sheet for Healthcare Providers: IncredibleEmployment.be  This test is not yet approved or cleared by the [place] FDA and has been authorized for detection and/or diagnosis of SARS-CoV-2 by FDA under an Emergency Use Authorization (EUA). This EUA will remain in effect (meaning this test can be used) for the duration of the COVID-19 declaration under Section 564(b)(1) of the Act, 21 U.S.C. section 360bbb-3(b)(1), unless the authorization is terminated or revoked.  Performed at [facility], [place] [address] [place], [place] [address]   Culture, blood (routine x 2)     Status: None   Collection Time: 12/26/21  5:13 PM   Specimen: BLOOD  Result Value Ref Range Status   Specimen Description BLOOD SITE NOT SPECIFIED  Final   Special Requests   Final    BOTTLES DRAWN AEROBIC AND ANAEROBIC Blood Culture results may not be optimal due to an inadequate volume of blood received in culture bottles    Culture   Final    NO GROWTH 5 DAYS Performed at [facility], [place] [address] [place], [place] [address]    Report Status 12/31/2021 FINAL  Final  Respiratory (~20 pathogens) panel by PCR     Status: None   Collection Time: 12/26/21 10:55 PM   Specimen: Nasopharyngeal Swab; Respiratory  Result Value Ref Range Status   Adenovirus NOT DETECTED NOT DETECTED Final   Coronavirus 229E NOT DETECTED NOT DETECTED Final    Comment: (NOTE) The Coronavirus on the Respiratory Panel, DOES NOT test for the novel  Coronavirus (2019 nCoV)    Coronavirus HKU1 NOT DETECTED NOT DETECTED Final   Coronavirus NL63 NOT DETECTED NOT DETECTED Final   Coronavirus OC43 NOT DETECTED NOT DETECTED Final   Metapneumovirus NOT DETECTED NOT DETECTED Final   Rhinovirus / Enterovirus NOT DETECTED NOT DETECTED Final   Influenza A  NOT DETECTED NOT DETECTED Final   Influenza B NOT DETECTED NOT DETECTED Final   Parainfluenza Virus 1 NOT DETECTED NOT DETECTED Final   Parainfluenza Virus 2 NOT DETECTED NOT DETECTED Final   Parainfluenza Virus 3 NOT DETECTED NOT DETECTED Final   Parainfluenza Virus 4 NOT DETECTED NOT DETECTED Final   Respiratory Syncytial Virus NOT DETECTED NOT DETECTED Final   Bordetella pertussis NOT DETECTED NOT DETECTED Final   Bordetella Parapertussis NOT DETECTED NOT DETECTED Final   Chlamydophila pneumoniae NOT DETECTED NOT DETECTED Final   Mycoplasma pneumoniae NOT DETECTED NOT DETECTED Final    Comment: Performed at [facility], [place] [address] [place], [place] [address]  Urine Culture     Status: None   Collection Time: 12/28/21  5:50 AM   Specimen: Urine, Clean Catch  Result Value Ref Range Status   Specimen Description URINE, CLEAN CATCH  Final   Special Requests NONE  Final   Culture   Final    NO GROWTH Performed at [facility], [place] [address], [place], [place] [address]    Report Status 12/29/2021 FINAL  Final    Labs: CBC: Recent Labs  Lab 12/28/21 0316  12/29/21 0335 12/30/21 0649  WBC 10.1 10.8* 9.0  HGB 13.1 13.2 13.5  HCT 40.6 40.9 41.3  MCV 92.7 94.2 94.5  PLT 414* 452* 720*   Basic Metabolic Panel: Recent Labs  Lab 12/28/21 0316 12/29/21 0335 12/30/21 0649 12/31/21 0249 01/01/22 0133 01/02/22 0737 01/03/22 0546  NA 133*   < > 135 129* 134* 133* 136  K 4.0   < > 4.2 4.3 3.8 3.4* 4.6  CL 89*   < > 86* 81* 87* 85* 90*  CO2 32   < > 39* 39* 36* 38* 37*  GLUCOSE 120*   < > 120* 133* 107* 101* 106*  BUN 14   < > '10 13 17 15 20  '$ CREATININE 0.86   < > 0.85 1.03* 0.86 0.83 0.78  CALCIUM 8.8*   < > 8.7* 8.7* 8.3* 8.4* 8.7*  MG 2.1  --  1.9  --   --   --   --    < > = values in this interval not displayed.   Liver Function Tests: Recent Labs  Lab 12/28/21 0316  AST 32  ALT 31  ALKPHOS 75  BILITOT 0.5  PROT 6.1*  ALBUMIN 2.2*   CBG: Recent Labs  Lab 12/27/21 1620 01/02/22 1232  GLUCAP 120* 123*    Discharge time spent: greater than 30 minutes.  Signed: Tawni Millers, MD [facility] 01/03/2022

## 2022-01-03 NOTE — Progress Notes (Signed)
   Heart Failure Stewardship Pharmacist Progress Note   PCP: Elisabeth Cara, PA-C PCP-Cardiologist: [name], MD    HPI:  76 y.o. female with medical history significant of TTP, hypertension, obesity, anxiety, atrial fibrillation, hyperlipidemia, low back pain, CAD, diastolic CHF, hypothyroidism, mycoplasma pneumonia, splenectomy who presented with shortness of breath. She was found to have acute on chronic HF and viral URI. Most recent echo before admission was 03/2021 where LVEF was 55-60% with moderately reduced RV function. Repeat echo this admission on 11/28 showed LVEF of 55-60%, indeterminate diastolic parameters, D-shaped RV suggestive of RV volume overload. She has a history of DES to LAD s/p stenting in 2017.  Current HF Medications: Diuretic: furosemide 60 mg PO daily Beta Blocker: Toprol XL 25 mg daily SGLT2i: Spironolactone 25 mg daily Other: Imdur 30 mg daily  Prior to admission HF Medications: Diuretic: Lasix 40 mg daily Beta Blocker: Toprol XL 25 mg daily Other: Imdur 30 mg daily  Pertinent Lab Values: Serum creatinine 0.78, BUN 20, Potassium 4.6, Sodium 136, BNP 377.9, Magnesium 1.9  Vital Signs: Weight: 252 lbs (admission weight: 261.5 lbs) Blood pressure: 110-120/80s  Heart rate: 80s  I/O: -2L yesterday; net -12L since admission   Medication Assistance / Insurance Benefits Check: Does the patient have prescription insurance?  Yes Type of insurance plan: Medicare  Outpatient Pharmacy:  [facility] Is the patient willing to use [facility] at discharge? Yes Is the patient willing to transition their outpatient pharmacy to utilize a [facility] outpatient pharmacy?   Pending    Assessment: 1. Acute on chronic right sided CHF (LVEF 55-60%), due to ICM. NYHA class III symptoms. - Maintain strict I/Os, daily weights, Mg >2, and K >4. - Patient has limited mobility currently given her fatigue, however she reports at  home being able to ambulate to the bathroom multiple times per day. Can consider [place] Glade for right sided HF and HFpEF after external urinary catheter is removed. - BP is labile, hold on GDMT titration, can consider low dose ARB if BP increases in the future. - Continue spironolactone and PO lasix.   Plan: 1) Medication changes recommended at this time: - Continue current medications  2) Patient assistance: - None, Farxiga and Jardiance are $47   3)  Education  - To be completed prior to discharge  Kerby Nora, PharmD, BCPS Heart Failure Cytogeneticist Phone [phone]

## 2022-01-03 NOTE — Progress Notes (Signed)
PT Cancellation Note  Patient Details Name: NAYDENE KAMROWSKI MRN: [id number] DOB: [birth year]   Cancelled Treatment:    Reason Eval/Treat Not Completed: Other (comment);Medical issues which prohibited therapy.  Subtherapeutic INR, retry as pt can allow.   Ramond Dial 01/03/2022, 2:23 PM  Mee Hives, PT PhD Acute Rehab Dept. Number: [place] and [place]

## 2022-01-03 NOTE — TOC Progression Note (Addendum)
Transition of Care [facility]) - Progression Note    Patient Details  Name: [name] MRN: [id number] Date of Birth: May 04, 1945  Transition of Care [facility]) CM/SW [place], [place] Phone Number: 01/03/2022, 10:16 AM  Clinical Narrative:     Update- CSW added facility to patients insurance authorization. Patients insurance authorization has been approved. [place] ID# [id number]. Insurance authorization has been approved from 12/4-12/6. CSW informed facility who confirmed they can accept patient today if medically ready. CSW informed MD.  Update-10:28am- CSW received call back from [place] with [facility] who confirmed SNF bed for patient. CSW informed patient who accepted SNF bed offer. CSW will add facility choice to patients insurance authorization. Patients insurance authorization currently pending. CSW will continue to follow.  CSW spoke with Judson Roch with [facility] who is going to review patient to see if facility can make SNF bed offer for patient. CSW awaiting determination. CSW will continue to follow and assist with patients dc planning needs.  Expected Discharge Plan: Skilled Nursing Facility Barriers to Discharge: Continued Medical Work up  Expected Discharge Plan and Services Expected Discharge Plan: [place] In-house Referral: Clinical Social Work Discharge Planning Services: CM Consult   Living arrangements for the past 2 months: Single Family Home                                       Social Determinants of Health (SDOH) Interventions Transportation Interventions: Intervention Not Indicated Alcohol Usage Interventions: Intervention Not Indicated (Score <7) Financial Strain Interventions: Intervention Not Indicated  Readmission Risk Interventions     No data to display

## 2022-01-03 NOTE — TOC Transition Note (Signed)
Transition of Care [facility]) - CM/SW Discharge Note   Patient Details  Name: [name] MRN: [id number] Date of Birth: Feb 03, 1945  Transition of Care [facility]) CM/SW Contact:  Milas Gain, [place] Phone Number: 01/03/2022, 1:17 PM   Clinical Narrative:     Patient will DC to: [facility]  Anticipated DC date: 01/03/2022  Family notified: [name]  Transport by: Corey Harold  ?  Per MD patient ready for DC to [facility] . RN, patient, patient's family, and facility notified of DC. Discharge Summary sent to facility. RN given number for report tele# [phone] RM# 36. DC packet on chart. DNR signed by MD attached to patients DC packet.Ambulance transport requested for patient.  CSW signing off.   Final next level of care: Skilled Nursing Facility Barriers to Discharge: No Barriers Identified   Patient Goals and CMS Choice Patient states their goals for this hospitalization and ongoing recovery are:: SNF CMS Medicare.gov Compare Post Acute Care list provided to:: Patient Choice offered to / list presented to : Patient  Discharge Placement              Patient chooses bed at:  [facility]) Patient to be transferred to facility by: [place] Name of family member notified: [name] Patient and family notified of of transfer: 01/03/22  Discharge Plan and Services In-house Referral: Clinical Social Work Discharge Planning Services: CM Consult                                 Social Determinants of Health ([place]) Interventions Transportation Interventions: Intervention Not Indicated Alcohol Usage Interventions: Intervention Not Indicated (Score <7) Financial Strain Interventions: Intervention Not Indicated   Readmission Risk Interventions     No data to display

## 2022-01-03 NOTE — Progress Notes (Signed)
Heart Failure Nurse Navigator Progress Note  Spoke with attending physician regarding progression of care. Pt code status changed to DNR. Has significant RHF. Patient would have low benefit from [place] clinic follow up appointments. Suggested for Primary outpatient team to follow. Plan for SNF.   Removed HV TOC appt.    Pricilla Holm, MSN, RN Heart Failure Nurse Navigator

## 2022-01-14 ENCOUNTER — Encounter (HOSPITAL_COMMUNITY): Payer: Medicare Other

## 2022-02-14 ENCOUNTER — Other Ambulatory Visit: Payer: Self-pay

## 2022-02-14 NOTE — Telephone Encounter (Signed)
Received call from pt stating she was discharged from rehab and needs appt with [facility]. Pt has Home Health orders with [facility]. Called and spoke with Charlann Boxer, RN at [facility]. Provided verbal order to check POC INR at next Home Health visit on Wednesday(02/16/22). Called pt and made her aware.

## 2022-02-16 ENCOUNTER — Ambulatory Visit (INDEPENDENT_AMBULATORY_CARE_PROVIDER_SITE_OTHER): Payer: Medicare Other

## 2022-02-16 DIAGNOSIS — Z5181 Encounter for therapeutic drug level monitoring: Secondary | ICD-10-CM

## 2022-02-16 LAB — POCT INR: INR: 2.9 (ref 2.0–3.0)

## 2022-02-16 NOTE — Patient Instructions (Signed)
Description   Spoke with [place] Parents, RN and pt. Instructed for pt to start taking '5mg'$  (1 tablet) daily except 7.5 mg (1.5 tablets) on Tuesdays.  Please resume normal leafy veggies 2 times a week & stay consistent with greens each week.  Recheck INR 1 week with [facility]  Call us with any new medications or concerns [phone].

## 2022-02-23 ENCOUNTER — Ambulatory Visit (INDEPENDENT_AMBULATORY_CARE_PROVIDER_SITE_OTHER): Payer: Medicare Other

## 2022-02-23 DIAGNOSIS — Z5181 Encounter for therapeutic drug level monitoring: Secondary | ICD-10-CM | POA: Diagnosis not present

## 2022-02-23 LAB — POCT INR: INR: 3.4 — AB (ref 2.0–3.0)

## 2022-02-23 NOTE — Patient Instructions (Signed)
Description   Spoke with [place] Parents, RN and pt. Instructed for pt to only take 0.5 tablet tomorrow and then resume '5mg'$  (1 tablet) daily except 7.5 mg (1.5 tablets) on Tuesdays.  Continue veggies 2 times a week & stay consistent with greens each week.  Recheck INR 1 week with [facility]  Call us with any new medications or concerns [phone].

## 2022-03-01 ENCOUNTER — Ambulatory Visit: Payer: Medicare Other | Admitting: Cardiology

## 2022-03-02 ENCOUNTER — Ambulatory Visit (INDEPENDENT_AMBULATORY_CARE_PROVIDER_SITE_OTHER): Payer: Medicare Other

## 2022-03-02 DIAGNOSIS — Z5181 Encounter for therapeutic drug level monitoring: Secondary | ICD-10-CM

## 2022-03-02 LAB — POCT INR: INR: 3.2 — AB (ref 2.0–3.0)

## 2022-03-02 NOTE — Patient Instructions (Signed)
Description   Spoke with [place] Parents, RN and [name]. Instructed for [name] to only take 0.5 tablet today and then START taking '5mg'$  (1 tablet) daily. Continue veggies 2 times a week & stay consistent with greens each week.  Recheck INR 1 week with [facility]  Call us with any new medications or concerns [phone].

## 2022-03-04 ENCOUNTER — Encounter: Payer: Self-pay | Admitting: Cardiology

## 2022-03-04 ENCOUNTER — Ambulatory Visit: Payer: Medicare Other | Attending: Cardiology | Admitting: Cardiology

## 2022-03-04 VITALS — BP 102/62 | HR 76 | Ht 64.0 in | Wt 245.0 lb

## 2022-03-04 DIAGNOSIS — I4819 Other persistent atrial fibrillation: Secondary | ICD-10-CM | POA: Diagnosis not present

## 2022-03-04 DIAGNOSIS — I5032 Chronic diastolic (congestive) heart failure: Secondary | ICD-10-CM | POA: Diagnosis not present

## 2022-03-04 DIAGNOSIS — I2510 Atherosclerotic heart disease of native coronary artery without angina pectoris: Secondary | ICD-10-CM

## 2022-03-04 MED ORDER — SPIRONOLACTONE 25 MG PO TABS: 25.0000 mg | ORAL_TABLET | Freq: Every day | ORAL | 3 refills | Status: DC

## 2022-03-04 MED ORDER — POTASSIUM CHLORIDE CRYS ER 10 MEQ PO TBCR: 10.0000 meq | EXTENDED_RELEASE_TABLET | Freq: Every day | ORAL | 3 refills | Status: DC

## 2022-03-04 MED ORDER — METOPROLOL SUCCINATE ER 25 MG PO TB24: 25.0000 mg | ORAL_TABLET | Freq: Every day | ORAL | 3 refills | Status: DC

## 2022-03-04 MED ORDER — FUROSEMIDE 80 MG PO TABS: 80.0000 mg | ORAL_TABLET | Freq: Every day | ORAL | 3 refills | Status: DC

## 2022-03-04 NOTE — Patient Instructions (Signed)
Medication Instructions:  Please increase Furosemide to 80 mg a day. Continue all other medications as listed.  *If you need a refill on your cardiac medications before your next appointment, please call your pharmacy*  Follow-Up: At [facility], you and your health needs are our priority.  As part of our continuing mission to provide you with exceptional heart care, we have created designated Provider Care Teams.  These Care Teams include your primary Cardiologist (physician) and Advanced Practice Providers (APPs -  Physician Assistants and Nurse Practitioners) who all work together to provide you with the care you need, when you need it.  We recommend signing up for the patient portal called "MyChart".  Sign up information is provided on this After Visit Summary.  MyChart is used to connect with patients for Virtual Visits (Telemedicine).  Patients are able to view lab/test results, encounter notes, upcoming appointments, etc.  Non-urgent messages can be sent to your provider as well.   To learn more about what you can do with MyChart, go to [email or web address].    Your next appointment:   1 year(s)  Provider:   [name], MD

## 2022-03-04 NOTE — Progress Notes (Signed)
Cardiology Office Note:    Date:  03/04/2022   ID:  [name], DOB Jan 17, 1946, MRN [id number]  PCP:  [place], [place] E, [place]  Cardiologist:  [name], MD  Advanced Practice Provider:  No care team member to display Electrophysiologist:  Will Meredith Leeds, MD       Referring MD: Belva Bertin, [place], *     History of Present Illness:    [name] is a 77 y.o. female here for the follow-up of atrial fibrillation and congestive heart failure.   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  She was in nursing home after pneumonia hospitalization.  She has lost weight.  No chest pain.  No significant shortness of breath.  She reports that 80 mg of furosemide makes her cramp, so she usually takes 60 mg.  We are going to try this again now that she is on the spironolactone as well.    Past Medical History:  Diagnosis Date   CAD (coronary artery disease)    a. LHC 3/17: mLAD 80, D1 50, pRCA 30, EF 25-35%; PCI: 3.5 x 16 mm Synergy DES to mLAD // Myoview 08/2018: EF 54, normal perfusion; Low Risk   Chronic diastolic CHF 07/20/5091   Echocardiogram 08/2018: EF 55-60, mild septal basal hypertrophy, Gr 2 DD, elevated LVEDP, mild LAE, mild AI   Dilated cardiomyopathy >> EF returned to normal in NSR    Probable Tachycardia Mediated // a. Echo 12/16: Mild concentric LVH, EF 25-30%, anteroseptal akinesis, inferoseptal akinesis, anterior akinesis, trivial AI, mild MR, moderate LAE, trivial TR, trivial PI, PASP 33 mmHg  //  b. Echo 2/17: Mild LVH, EF 25-30%, diffuse HK, mild LAE  //  c. Echo 7/17: mild LVH, EF 50-55%, no RWMA, trivial AI, mild to mod LAE, mild RAE   Febrile seizure ([place]) 2004   "during TTP thing; cause my temp was 106"   High cholesterol    History of blood transfusion 2004   "several; related to TTP"   Hypertension    Hypothyroidism    Morbid obesity ([place]) 01/07/2013   Osteoarthritis of back     Paroxysmal atrial fibrillation ([place]) 08/01/2011   a. 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    Past Surgical History:  Procedure Laterality Date   ABDOMINAL EXPLORATION SURGERY     "opened me up 2-3 times for blocked intestines"   APPENDECTOMY     BACK SURGERY     BASAL CELL CARCINOMA EXCISION Right    "neck"   CARDIAC CATHETERIZATION N/A 04/17/2015   Procedure: Left Heart Cath and Coronary Angiography;  Surgeon: Jettie Booze, MD;  Location: [place] [facility];  Service: Cardiovascular;  Laterality: N/A;   CARDIAC CATHETERIZATION  04/17/2015   Procedure: Coronary Stent Intervention;  Surgeon: Jettie Booze, MD;  Location: [place] [facility];  Service: Cardiovascular;;   CARDIOVERSION  06/2002; 03/2005; 05/2005   Archie Endo 06/16/2010   CARDIOVERSION N/A 10/03/2015   Procedure: CARDIOVERSION;  Surgeon: Deboraha Sprang, MD;  Location: [place];  Service: Cardiovascular;  Laterality: N/A;   CATARACT EXTRACTION W/ INTRAOCULAR LENS  IMPLANT, BILATERAL Bilateral    CHOLECYSTECTOMY OPEN     CORONARY ANGIOPLASTY     DILATION AND CURETTAGE  OF UTERUS     S/P "miscarriage"   FOOT SURGERY Right    "for nerve damage"   SALIVARY STONE REMOVAL Left    "had tumor wrapped around it"   SPLENECTOMY, TOTAL     /notes 06/16/2010   TEE WITHOUT CARDIOVERSION N/A 09/30/2015   Procedure: TRANSESOPHAGEAL ECHOCARDIOGRAM (TEE);  Surgeon: Skeet Latch, MD;  Location: [place];  Service: Cardiovascular;  Laterality: N/A;   THORACIC DISCECTOMY  05/2002   THYROIDECTOMY, PARTIAL  X 2   "grew back"   TONSILLECTOMY     TUBAL LIGATION     TUMOR EXCISION     "benign tumor in my neck"   VAGINAL HYSTERECTOMY      Current Medications: Current Meds  Medication Sig   acetaminophen (TYLENOL) 325 MG tablet Take 2  tablets (650 mg total) by mouth every 6 (six) hours as needed for mild pain (or Fever >/= 101).   albuterol (ACCUNEB) 0.63 MG/3ML nebulizer solution Take 3 mLs by nebulization every 4 (four) hours as needed for wheezing or shortness of breath.   Cholecalciferol (VITAMIN D3) 2000 units capsule Take 2,000 Units by mouth every morning.   furosemide (LASIX) 80 MG tablet Take 1 tablet (80 mg total) by mouth daily.   isosorbide mononitrate (IMDUR) 30 MG 24 hr tablet TAKE 1 TABLET BY MOUTH EVERY DAY (Patient taking differently: Take 30 mg by mouth daily.)   levothyroxine (SYNTHROID, LEVOTHROID) 50 MCG tablet Take 50 mcg by mouth daily before breakfast.   meclizine (ANTIVERT) 25 MG tablet Take 12.5-25 mg by mouth 3 (three) times daily as needed for dizziness.   sotalol (BETAPACE) 120 MG tablet TAKE 1 TABLET BY MOUTH TWICE A DAY   warfarin (COUMADIN) 5 MG tablet Take 1 tablet (5 mg total) by mouth daily.   [DISCONTINUED] furosemide (LASIX) 20 MG tablet Take 3 tablets (60 mg total) by mouth daily.   [DISCONTINUED] metoprolol succinate (TOPROL-XL) 25 MG 24 hr tablet TAKE 1 TABLET BY MOUTH EVERY DAY WITH OR IMMEDIATELY FOLLOWING A MEAL (Patient taking differently: Take 25 mg by mouth daily.)   [DISCONTINUED] potassium chloride (KLOR-CON) 10 MEQ tablet Take 10 mEq by mouth daily.   [DISCONTINUED] spironolactone (ALDACTONE) 25 MG tablet Take 1 tablet (25 mg total) by mouth daily.     Allergies:   Ceftriaxone, Penicillins, Levofloxacin, Levofloxacin, Oxycodone, Tape, Alendronate, Aspirin, Oxycodone-acetaminophen, and Penicillin g   Social History   Socioeconomic History   Marital status: Married    Spouse name: Not on file   Number of children: Not on file   Years of education: Not on file   Highest education level: High school graduate  Occupational History   Occupation: retired  Tobacco Use   Smoking status: Never   Smokeless tobacco: Never  Vaping Use   Vaping Use: Never used  Substance and  Sexual Activity   Alcohol use: No   Drug use: No   Sexual activity: Never  Other Topics Concern   Not on file  Social History Narrative   Not on file   Social Determinants of Health   Financial Resource Strain: Low Risk  (12/31/2021)   Overall Financial Resource Strain (CARDIA)    Difficulty of Paying Living Expenses: Not very hard  Food Insecurity: No Food Insecurity (12/26/2021)   Hunger Vital Sign    Worried About Running Out of Food in the Last Year: Never true    [place] in the Last Year: Never true  Transportation Needs: No Transportation Needs (12/31/2021)  PRAPARE - Hydrologist (Medical): No    Lack of Transportation (Non-Medical): No  Physical Activity: Not on file  Stress: Not on file  Social Connections: Not on file     Family History: The patient's family history includes Cancer in her brother and sister; Heart attack in her mother.  ROS:   Please see the history of present illness.    (+) Bilateral foot edema   All other systems reviewed and are negative.  EKGs/Labs/Other Studies Reviewed:    Echo 03/31/2021:  1. Left ventricular ejection fraction, by estimation, is 55 to 60%. The  left ventricle has normal function. The left ventricle has no regional  wall motion abnormalities. Left ventricular diastolic function could not  be evaluated.   2. Right ventricular systolic function is moderately reduced. The right  ventricular size is mildly enlarged. There is normal pulmonary artery  systolic pressure.   3. Right atrial size was moderately dilated.   4. The mitral valve is grossly normal. No evidence of mitral valve  regurgitation.   5. The aortic valve is grossly normal. Aortic valve regurgitation is not  visualized. No aortic stenosis is present.    Stress test 08/23/2018: Nuclear stress EF: 54%. No T wave inversion was noted during stress. There was no ST segment deviation noted during stress. This is a low risk  study.   Normal perfusion. LVEF 54% with normal wall motion. This is a low risk study.   Left Heart Cath and Coronary Angiography 04/17/2015: 1st Diag lesion, 50% stenosed. Mid LAD lesion, 80% stenosed. Post intervention with a 3.5 x 16 Synergy drug eluting stent, there is a 0% residual stenosis. There is moderate left ventricular systolic dysfunction.   She'll need clopidogrel along with her warfarin for at least several months. Ideally, she could have at least 6 months of antiplatelet therapy. I did not start aspirin because she has refused it earlier today. We'll restart Coumadin. Continue Angiomax for another hour or so. She'll be watched overnight with plan for discharge tomorrow. She should follow-up with Dr. Marlou Porch and continue with aggressive medical therapy for cardiomyopathy.   EKG: EKG is personally reviewed.  01/02/2022-atrial fibrillation heart rate in the 70s.  Recent Labs: 12/26/2021: B Natriuretic Peptide 377.9 12/28/2021: ALT 31 12/30/2021: Hemoglobin 13.5; Magnesium 1.9; Platelets 486 01/03/2022: BUN 20; Creatinine, Ser 0.78; Potassium 4.6; Sodium 136  Recent Lipid Panel    Component Value Date/Time   CHOL 213 (H) 04/01/2021 0247   TRIG 106 04/01/2021 0247   HDL 48 04/01/2021 0247   CHOLHDL 4.4 04/01/2021 0247   VLDL 21 04/01/2021 0247   LDLCALC 144 (H) 04/01/2021 0247     Risk Assessment/Calculations:      Physical Exam:    VS:  BP 102/62   Pulse 76   Ht '5\' 4"'$  (1.626 m)   Wt 245 lb (111.1 kg)   BMI 42.05 kg/m     Wt Readings from Last 3 Encounters:  03/04/22 245 lb (111.1 kg)  01/03/22 252 lb 3.3 oz (114.4 kg)  09/17/21 260 lb (117.9 kg)     GEN:  Well nourished, well developed in no acute distress, in wheel chair HEENT: Normal NECK: No JVD; No carotid bruits LYMPHATICS: No lymphadenopathy CARDIAC: irreg, no murmurs, rubs, gallops RESPIRATORY:  Clear to auscultation without rales, wheezing or rhonchi  ABDOMEN: Soft, non-tender,  non-distended MUSCULOSKELETAL:  No edema; No deformity  SKIN: Warm and dry NEUROLOGIC:  Alert and oriented x 3 PSYCHIATRIC:  Normal affect   ASSESSMENT:    1. Chronic diastolic CHF   2. Coronary artery disease involving native coronary artery of native heart without angina pectoris   3. Persistent atrial fibrillation (HCC)      PLAN:    In order of problems listed above:   Acute diastolic heart failure/shortness of breath - Much improved since last visit.  Weight was 292, now 245. - She has been taking Lasix 60 mg which has been challenging given the 3 tablets of 20.  We will change Lasix to 80 mg once a day.  She knows to take an extra pill if necessary for increased swelling.  Doing well.  Continue with spironolactone last potassium was 4.6.  She was worried about getting cramping with 80 mg of Lasix.  I think with the spironolactone she should be okay.  Last creatinine 0.78.   Morbid obesity -  Watch fast food.  Continue to encourage weight loss   Paroxysmal atrial fibrillation - Previously maintaining sinus rhythm on sotalol.  Monitoring QTC.    QTC at last check was 497 no changes.  Prior EKG does show atrial fibrillation present.  For now, we will continue with both the sotalol as well as the Toprol.  If she is still in atrial fibrillation at next visit, we will likely discontinue the sotalol as it is not contributing to normal rhythm.  Continuing with current medication management.   Bradycardia - Metoprolol previously decreased to 25 mg a day back in August 2021.  Stable.  Overall doing well.  Atrial fibrillation currently under good control.   Chronic anticoagulation - On Coumadin no bleeding.  Could not afford the Eliquis/Xarelto.  This is [facility] at [facility].  Stable.  Coronary artery disease - Prior drug-eluting stent to LAD in 2017.  Nuclear stress test July 2020 was low risk.  On isosorbide for anginal symptoms.  Doing well no changes in medication  management   Prior history of TTP - Previously seen Dr. Beryle Beams.  She was told not to take COVID-vaccine because of this.  Stable without any bleeding issues, platelets 486,000 previously checked.     Follow up: 1 year  Medication Adjustments/Labs and Tests Ordered: Current medicines are reviewed at length with the patient today.  Concerns regarding medicines are outlined above.  No orders of the defined types were placed in this encounter.  Meds ordered this encounter  Medications   spironolactone (ALDACTONE) 25 MG tablet    Sig: Take 1 tablet (25 mg total) by mouth daily.    Dispense:  90 tablet    Refill:  3   potassium chloride (KLOR-CON M) 10 MEQ tablet    Sig: Take 1 tablet (10 mEq total) by mouth daily.    Dispense:  90 tablet    Refill:  3   metoprolol succinate (TOPROL-XL) 25 MG 24 hr tablet    Sig: Take 1 tablet (25 mg total) by mouth daily.    Dispense:  90 tablet    Refill:  3   furosemide (LASIX) 80 MG tablet    Sig: Take 1 tablet (80 mg total) by mouth daily.    Dispense:  90 tablet    Refill:  3    Patient Instructions  Medication Instructions:  Please increase Furosemide to 80 mg a day. Continue all other medications as listed.  *If you need a refill on your cardiac medications before your next appointment, please call your pharmacy*  Follow-Up: At [facility], you  and your health needs are our priority.  As part of our continuing mission to provide you with exceptional heart care, we have created designated Provider Care Teams.  These Care Teams include your primary Cardiologist (physician) and Advanced Practice Providers (APPs -  Physician Assistants and Nurse Practitioners) who all work together to provide you with the care you need, when you need it.  We recommend signing up for the patient portal called "MyChart".  Sign up information is provided on this After Visit Summary.  MyChart is used to connect with patients for Virtual Visits  (Telemedicine).  Patients are able to view lab/test results, encounter notes, upcoming appointments, etc.  Non-urgent messages can be sent to your provider as well.   To learn more about what you can do with MyChart, go to [email or web address].    Your next appointment:   1 year(s)  Provider:   [name], MD           Signed, [name], MD  03/04/2022 1:48 PM    [place]

## 2022-03-09 ENCOUNTER — Ambulatory Visit (INDEPENDENT_AMBULATORY_CARE_PROVIDER_SITE_OTHER): Payer: Medicare Other

## 2022-03-09 DIAGNOSIS — Z5181 Encounter for therapeutic drug level monitoring: Secondary | ICD-10-CM | POA: Diagnosis not present

## 2022-03-09 LAB — POCT INR: INR: 3.5 — AB (ref 2.0–3.0)

## 2022-03-09 NOTE — Patient Instructions (Signed)
Description   Spoke with Ellis Parents, RN and [name]. Instructed for [name] to only take 0.5 tablet today and then START taking '5mg'$  (1 tablet) daily EXCEPT 0.5 on Sundays.  Continue veggies 2 times a week & stay consistent with greens each week.  Recheck INR 1 week with [facility]  Call us with any new medications or concerns [phone].

## 2022-03-16 ENCOUNTER — Ambulatory Visit (INDEPENDENT_AMBULATORY_CARE_PROVIDER_SITE_OTHER): Payer: Medicare Other | Admitting: *Deleted

## 2022-03-16 DIAGNOSIS — Z5181 Encounter for therapeutic drug level monitoring: Secondary | ICD-10-CM

## 2022-03-16 LAB — POCT INR: INR: 1.7 — AB (ref 2.0–3.0)

## 2022-03-16 NOTE — Patient Instructions (Addendum)
Description   Spoke with Ellis Parents, RN and pt and instructed for pt to take 1.5 tablets today and then continue taking 85m (1 tablet) daily EXCEPT 2.534m(1/2 tablet) on Sundays.  Continue veggies 2 times a week & stay consistent with greens each week.  [facility] Call usKoreaith any new medications or concerns #[phone]

## 2022-03-23 ENCOUNTER — Ambulatory Visit (INDEPENDENT_AMBULATORY_CARE_PROVIDER_SITE_OTHER): Payer: Medicare Other

## 2022-03-23 DIAGNOSIS — Z5181 Encounter for therapeutic drug level monitoring: Secondary | ICD-10-CM | POA: Diagnosis not present

## 2022-03-23 LAB — POCT INR: INR: 2 (ref 2.0–3.0)

## 2022-03-23 NOTE — Patient Instructions (Signed)
Description   Spoke with Ellis Parents, RN and pt and instructed for pt to take 1.5 tablets today and then continue taking 73m (1 tablet) daily EXCEPT 2.570m(1/2 tablet) on Sundays.  Continue veggies 2 times a week & stay consistent with greens each week.  [facility] us[facility] any new medications or concerns #[phone]

## 2022-04-06 ENCOUNTER — Telehealth: Payer: Self-pay | Admitting: *Deleted

## 2022-04-06 NOTE — Telephone Encounter (Signed)
Received orders for Dr. Marlou Porch to sign for pt's INR draw to be checked tomorrow instead of today. Will fax back to [phone].

## 2022-04-07 ENCOUNTER — Ambulatory Visit (INDEPENDENT_AMBULATORY_CARE_PROVIDER_SITE_OTHER): Payer: Medicare Other | Admitting: *Deleted

## 2022-04-07 DIAGNOSIS — Z5181 Encounter for therapeutic drug level monitoring: Secondary | ICD-10-CM

## 2022-04-07 DIAGNOSIS — Z862 Personal history of diseases of the blood and blood-forming organs and certain disorders involving the immune mechanism: Secondary | ICD-10-CM

## 2022-04-07 DIAGNOSIS — M3119 Other thrombotic microangiopathy: Secondary | ICD-10-CM

## 2022-04-07 LAB — POCT INR: INR: 1.7 — AB (ref 2.0–3.0)

## 2022-04-07 NOTE — Patient Instructions (Signed)
Description   Spoke with Lorena, RN and pt and instructed for pt to take 1.5 tablets today and then START taking '5mg'$  (1 tablet) daily. Continue veggies 2 times a week & stay consistent with greens each week. Recheck INR 1 week with [facility].  Call us with any new medications or concerns [phone].

## 2022-04-13 ENCOUNTER — Ambulatory Visit (INDEPENDENT_AMBULATORY_CARE_PROVIDER_SITE_OTHER): Payer: Medicare Other

## 2022-04-13 DIAGNOSIS — M3119 Other thrombotic microangiopathy: Secondary | ICD-10-CM | POA: Diagnosis not present

## 2022-04-13 DIAGNOSIS — Z5181 Encounter for therapeutic drug level monitoring: Secondary | ICD-10-CM | POA: Diagnosis not present

## 2022-04-13 LAB — POCT INR: INR: 2 (ref 2.0–3.0)

## 2022-04-13 MED ORDER — WARFARIN SODIUM 5 MG PO TABS: ORAL_TABLET | ORAL | 0 refills | Status: DC

## 2022-04-13 NOTE — Patient Instructions (Signed)
Description   Spoke with Lorena, RN and pt and instructed for pt to take 1.5 tablets today and then continue taking '5mg'$  (1 tablet) daily.  Continue veggies 2 times a week & stay consistent with greens each week.  Recheck INR 1 week with [facility].  Call us with any new medications or concerns [phone].

## 2022-04-20 ENCOUNTER — Ambulatory Visit (INDEPENDENT_AMBULATORY_CARE_PROVIDER_SITE_OTHER): Payer: Medicare Other | Admitting: *Deleted

## 2022-04-20 DIAGNOSIS — M3119 Other thrombotic microangiopathy: Secondary | ICD-10-CM

## 2022-04-20 DIAGNOSIS — Z5181 Encounter for therapeutic drug level monitoring: Secondary | ICD-10-CM | POA: Diagnosis not present

## 2022-04-20 LAB — POCT INR: INR: 1.7 — AB (ref 2.0–3.0)

## 2022-04-27 ENCOUNTER — Ambulatory Visit (INDEPENDENT_AMBULATORY_CARE_PROVIDER_SITE_OTHER): Payer: Medicare Other

## 2022-04-27 DIAGNOSIS — M3119 Other thrombotic microangiopathy: Secondary | ICD-10-CM

## 2022-04-27 DIAGNOSIS — Z5181 Encounter for therapeutic drug level monitoring: Secondary | ICD-10-CM

## 2022-04-27 LAB — POCT INR: INR: 2 (ref 2.0–3.0)

## 2022-04-27 NOTE — Patient Instructions (Signed)
Description   Spoke with Lowry Ram, RN and pt and instructed for pt to continue taking 5mg  (1 tablet) daily.  Continue veggies 2 times a week & stay consistent with greens each week.  Recheck INR 1 week with [facility].  Call us with any new medications or concerns [phone].

## 2022-05-04 ENCOUNTER — Ambulatory Visit (INDEPENDENT_AMBULATORY_CARE_PROVIDER_SITE_OTHER): Payer: Medicare Other

## 2022-05-04 DIAGNOSIS — M3119 Other thrombotic microangiopathy: Secondary | ICD-10-CM | POA: Diagnosis not present

## 2022-05-04 DIAGNOSIS — Z5181 Encounter for therapeutic drug level monitoring: Secondary | ICD-10-CM | POA: Diagnosis not present

## 2022-05-04 LAB — POCT INR: INR: 2 (ref 2.0–3.0)

## 2022-05-04 NOTE — Patient Instructions (Signed)
Description   Spoke with Lowry Ram, RN and pt and instructed for pt to take 1.5 tablets today and then continue taking 5mg  (1 tablet) daily.  Continue veggies 3 times a week & stay consistent with greens each week.  Recheck INR 2 weeks with [facility].  Call us with any new medications or concerns [phone].

## 2022-05-18 ENCOUNTER — Ambulatory Visit (INDEPENDENT_AMBULATORY_CARE_PROVIDER_SITE_OTHER): Payer: Medicare Other | Admitting: Pharmacist

## 2022-05-18 DIAGNOSIS — Z5181 Encounter for therapeutic drug level monitoring: Secondary | ICD-10-CM

## 2022-05-18 DIAGNOSIS — M3119 Other thrombotic microangiopathy: Secondary | ICD-10-CM

## 2022-05-18 LAB — POCT INR: INR: 2.4 (ref 2.0–3.0)

## 2022-05-18 NOTE — Patient Instructions (Signed)
Description   Spoke with Perlie Gold, RN [phone]) and continue taking  (1 tablet) daily.  Continue veggies 3 times a week & stay consistent with greens each week.  Recheck INR 2 weeks with [facility].  Call us with any new medications or concerns [phone].

## 2022-06-01 ENCOUNTER — Ambulatory Visit (INDEPENDENT_AMBULATORY_CARE_PROVIDER_SITE_OTHER): Payer: Medicare Other

## 2022-06-01 DIAGNOSIS — Z5181 Encounter for therapeutic drug level monitoring: Secondary | ICD-10-CM | POA: Diagnosis not present

## 2022-06-01 DIAGNOSIS — M3119 Other thrombotic microangiopathy: Secondary | ICD-10-CM | POA: Diagnosis not present

## 2022-06-01 LAB — POCT INR: INR: 2.3 (ref 2.0–3.0)

## 2022-06-01 NOTE — Patient Instructions (Signed)
Description   Spoke with Perlie Gold, RN [phone]) and instructed to continue taking 5mg  (1 tablet) daily.  Continue veggies 3 times a week & stay consistent with greens each week.  Recheck INR 2 weeks with [facility].  Call us with any new medications or concerns #[phone].

## 2022-06-10 ENCOUNTER — Telehealth: Payer: Self-pay | Admitting: *Deleted

## 2022-06-10 ENCOUNTER — Telehealth: Payer: Self-pay | Admitting: Cardiology

## 2022-06-10 NOTE — Telephone Encounter (Signed)
[facility] called and stated to cancel Appt on 5/17  because home health will be coming to pt's home on 5/15 and will check INR at that time.

## 2022-06-10 NOTE — Telephone Encounter (Signed)
Stop metoprolol 25mg  Donato Schultz, MD    Called and spoke [facility].  Advised of order to d/c metoprolol.  She states understanding and will notify the pt.   Metoprolol d/ced from pt's medication list.

## 2022-06-10 NOTE — Telephone Encounter (Signed)
STAT if patient feels like he/she is going to faint   Are you dizzy now?   Yes  Do you feel faint or have you passed out?   No  Do you have any other symptoms?   Weak  Have you checked your HR and BP (record if available)?   BP 90/50, HR  60, Oxygen sat 95  Caller stated patient's readings have been low and patient wants assistance getting BP cuff.  Caller stated last weight was 252 lbs.  Caller stated patient wants to continue home health services.

## 2022-06-10 NOTE — Telephone Encounter (Signed)
I spoke with Era Bumpers (patient's home health nurse).  She saw patient today for routine visit and patient's BP was 90/60.  This was the first visit Era Bumpers has had with the patient for awhile.  Patient has been followed by one of the other home health nurses.  Dizziness is not new.  Patient has it off and on.  Era Bumpers is calling for parameters as to when patient should hold medications.  Patient does not have a BP cuff at home.

## 2022-06-15 ENCOUNTER — Telehealth: Payer: Self-pay | Admitting: Cardiology

## 2022-06-15 ENCOUNTER — Ambulatory Visit (INDEPENDENT_AMBULATORY_CARE_PROVIDER_SITE_OTHER): Payer: Medicare Other

## 2022-06-15 DIAGNOSIS — Z5181 Encounter for therapeutic drug level monitoring: Secondary | ICD-10-CM

## 2022-06-15 DIAGNOSIS — M3119 Other thrombotic microangiopathy: Secondary | ICD-10-CM | POA: Diagnosis not present

## 2022-06-15 LAB — POCT INR: INR: 1.8 — AB (ref 2.0–3.0)

## 2022-06-15 NOTE — Telephone Encounter (Signed)
Pt c/o BP issue: STAT if pt c/o blurred vision, one-sided weakness or slurred speech  1. What are your last 5 BP readings? 90/50  2. Are you having any other symptoms (ex. Dizziness, headache, blurred vision, passed out)? No symptoms  3. What is your BP issue? Lorena with [facility] states the patient's BP is very low, but currently has no symptoms. She says they patient has been week and lazy feeling, but no more than usual.

## 2022-06-15 NOTE — Patient Instructions (Signed)
Description   Spoke with Era Bumpers, RN [phone]) and instructed to take 1.5 tablets today and then continue taking 5mg  (1 tablet) daily.  Continue veggies 3 times a week & stay consistent with greens each week.  Recheck INR 2 weeks with [facility].  Call us with any new medications or concerns #[phone].

## 2022-06-15 NOTE — Telephone Encounter (Signed)
[name] with [facility]  [phone]  Called to report that the pt BP today is 90/50 her HR 78... she is very tired and cannot get up to the scale... she hs been eating and drinking well.. she had decreased her lasix on her own several days ago to 40 mg daily due to frequent urination.   She denies SOB, no edema, no dizziness.   She is still taking her other meds.   I asked her to continue to monitor and to have her drink some added water to help support her BP.   I will forward to Dr Anne Fu for any further recommendations. .   The [facility] nurse offers to draw any labs that Dr Anne Fu may need in the future.

## 2022-06-15 NOTE — Telephone Encounter (Signed)
Also - pt's metoprolol was just discontinued 06/10/22. Will have Dr Anne Fu to review.

## 2022-06-16 NOTE — Telephone Encounter (Signed)
Lets try discontinuing the isosorbide.  Next step if hypotension continues to occur, we will cut the diuretic dosing in half.  Donato Schultz, MD    Lorena aware - she will instruct the pt to d/c Isosorbide and continue to monitor BP.  She will notify the office if no improvement.

## 2022-06-24 ENCOUNTER — Telehealth: Payer: Self-pay | Admitting: Cardiology

## 2022-06-24 NOTE — Telephone Encounter (Signed)
Lorena nurse from [facility] called stating pt is hypotensive , BLE swelling and does not monitor daily weight. According to the telephone note on 5/15:   Lets try discontinuing the isosorbide.  Next step if hypotension continues to occur, we will cut the diuretic dosing in half.  Explained MD recommendation, [facility] nurse stated patient is taking sotalol 120 mg  BID, she will like to know this medication can cause hypotension. Explained ED precautions, HH nurse voiced understanding. Will forward to MD and nurse for advise.    Blood pressure  5/24   86/50,90/50

## 2022-06-24 NOTE — Telephone Encounter (Signed)
Pt c/o BP issue: STAT if pt c/o blurred vision, one-sided weakness or slurred speech  1. What are your last 5 BP readings? 86/50; 56; 90/50; 56  2. Are you having any other symptoms (ex. Dizziness, headache, blurred vision, passed out)? Weak   3. What is your BP issue? Patient is experiencing hypotension.

## 2022-06-28 MED ORDER — FUROSEMIDE 80 MG PO TABS: 40.0000 mg | ORAL_TABLET | Freq: Every day | ORAL | 3 refills | Status: DC

## 2022-06-28 NOTE — Telephone Encounter (Signed)
Lorena aware of orders.  Isosorbide had previously been d/ced.  Pt has only been taking 40 mg of Furosemide daily.  Sotalol was discontinued as ordered.  Era Bumpers will continue to monitor and notify the office if any further concerns.

## 2022-07-01 ENCOUNTER — Ambulatory Visit (INDEPENDENT_AMBULATORY_CARE_PROVIDER_SITE_OTHER): Payer: Medicare Other

## 2022-07-01 DIAGNOSIS — M3119 Other thrombotic microangiopathy: Secondary | ICD-10-CM

## 2022-07-01 DIAGNOSIS — Z5181 Encounter for therapeutic drug level monitoring: Secondary | ICD-10-CM | POA: Diagnosis not present

## 2022-07-01 LAB — POCT INR: INR: 2.8 (ref 2.0–3.0)

## 2022-07-01 NOTE — Patient Instructions (Signed)
Description   Spoke with Vernona Rieger, RN [phone]) continue taking 5mg  (1 tablet) daily.  Continue veggies 3 times a week & stay consistent with greens each week.  Recheck INR 2 weeks with [facility].  Call us with any new medications or concerns #[phone].

## 2022-07-13 ENCOUNTER — Ambulatory Visit (INDEPENDENT_AMBULATORY_CARE_PROVIDER_SITE_OTHER): Payer: Medicare Other

## 2022-07-13 DIAGNOSIS — Z5181 Encounter for therapeutic drug level monitoring: Secondary | ICD-10-CM | POA: Diagnosis not present

## 2022-07-13 LAB — POCT INR: INR: 2.3 (ref 2.0–3.0)

## 2022-07-13 NOTE — Patient Instructions (Signed)
Description   Spoke with Lorena, RN continue taking 5mg  (1 tablet) daily.  Continue veggies 3 times a week & stay consistent with greens each week.  Recheck INR  2 weeks with [facility].  Call us with any new medications or concerns #[phone].

## 2022-07-22 ENCOUNTER — Other Ambulatory Visit: Payer: Self-pay | Admitting: Cardiology

## 2022-07-22 DIAGNOSIS — Z5181 Encounter for therapeutic drug level monitoring: Secondary | ICD-10-CM

## 2022-07-27 ENCOUNTER — Ambulatory Visit (INDEPENDENT_AMBULATORY_CARE_PROVIDER_SITE_OTHER): Payer: Medicare Other | Admitting: *Deleted

## 2022-07-27 DIAGNOSIS — Z5181 Encounter for therapeutic drug level monitoring: Secondary | ICD-10-CM | POA: Diagnosis not present

## 2022-07-27 DIAGNOSIS — M3119 Other thrombotic microangiopathy: Secondary | ICD-10-CM

## 2022-07-27 LAB — POCT INR: INR: 2.3 (ref 2.0–3.0)

## 2022-08-17 ENCOUNTER — Ambulatory Visit: Payer: Medicare Other | Attending: Cardiology | Admitting: *Deleted

## 2022-08-17 DIAGNOSIS — M3119 Other thrombotic microangiopathy: Secondary | ICD-10-CM | POA: Diagnosis not present

## 2022-08-17 DIAGNOSIS — I4819 Other persistent atrial fibrillation: Secondary | ICD-10-CM | POA: Diagnosis not present

## 2022-08-17 DIAGNOSIS — Z5181 Encounter for therapeutic drug level monitoring: Secondary | ICD-10-CM | POA: Diagnosis not present

## 2022-08-17 LAB — POCT INR: INR: 2 (ref 2.0–3.0)

## 2022-08-17 NOTE — Patient Instructions (Addendum)
Description   Today take 1.5 tablets of warfarin then continue taking 5mg  (1 tablet) daily. Continue veggies 3 times a week & stay consistent with greens each week. Recheck INR 4 weeks.  Call us with any new medications or concerns #[phone].

## 2022-09-16 ENCOUNTER — Ambulatory Visit: Payer: Medicare Other | Admitting: *Deleted

## 2022-09-16 DIAGNOSIS — M3119 Other thrombotic microangiopathy: Secondary | ICD-10-CM

## 2022-09-16 DIAGNOSIS — Z5181 Encounter for therapeutic drug level monitoring: Secondary | ICD-10-CM

## 2022-09-16 DIAGNOSIS — I4819 Other persistent atrial fibrillation: Secondary | ICD-10-CM | POA: Diagnosis not present

## 2022-09-16 LAB — POCT INR: INR: 2.2 (ref 2.0–3.0)

## 2022-09-16 NOTE — Patient Instructions (Signed)
Description   Continue taking 5mg  (1 tablet) daily. Continue veggies 3 times a week & stay consistent with greens each week. Recheck INR 6 weeks.  Call us with any new medications or concerns #[phone].

## 2022-10-25 ENCOUNTER — Other Ambulatory Visit: Payer: Self-pay | Admitting: Cardiology

## 2022-10-25 DIAGNOSIS — Z5181 Encounter for therapeutic drug level monitoring: Secondary | ICD-10-CM

## 2022-10-25 NOTE — Telephone Encounter (Signed)
Prescription refill request received for warfarin Lov: 03/04/22 [name])  Next INR check: 10/28/22 Warfarin tablet strength: 5mg   Appropriate dose. Refill sent.

## 2022-10-27 ENCOUNTER — Ambulatory Visit: Payer: Medicare Other | Attending: Cardiology

## 2022-10-27 DIAGNOSIS — I4819 Other persistent atrial fibrillation: Secondary | ICD-10-CM | POA: Diagnosis not present

## 2022-10-27 DIAGNOSIS — M3119 Other thrombotic microangiopathy: Secondary | ICD-10-CM | POA: Diagnosis not present

## 2022-10-27 DIAGNOSIS — Z5181 Encounter for therapeutic drug level monitoring: Secondary | ICD-10-CM | POA: Diagnosis not present

## 2022-10-27 LAB — POCT INR: INR: 1.6 — AB (ref 2.0–3.0)

## 2022-10-27 NOTE — Patient Instructions (Signed)
Description   Take 7.5mg  (1.5 tablets) today and tomorrow, then resume same dosage of Warfarin 5mg  (1 tablet) daily. Continue veggies 3 times a week & stay consistent with greens each week. Recheck INR 2 weeks.  Call us with any new medications or concerns #[phone].

## 2022-10-28 ENCOUNTER — Ambulatory Visit: Payer: Medicare Other

## 2022-11-04 ENCOUNTER — Ambulatory Visit: Payer: Medicare Other | Attending: Cardiology

## 2022-11-04 DIAGNOSIS — M3119 Other thrombotic microangiopathy: Secondary | ICD-10-CM

## 2022-11-04 DIAGNOSIS — Z5181 Encounter for therapeutic drug level monitoring: Secondary | ICD-10-CM | POA: Diagnosis not present

## 2022-11-04 DIAGNOSIS — I4819 Other persistent atrial fibrillation: Secondary | ICD-10-CM

## 2022-11-04 LAB — POCT INR: INR: 2.3 (ref 2.0–3.0)

## 2022-11-04 NOTE — Patient Instructions (Signed)
resume same dosage of Warfarin 5mg  (1 tablet) daily. Continue veggies 3 times a week & stay consistent with greens each week. Recheck INR 4 weeks.  Call us with any new medications or concerns #[phone].

## 2022-12-02 ENCOUNTER — Ambulatory Visit: Payer: Medicare Other | Attending: Cardiology

## 2022-12-02 DIAGNOSIS — I4819 Other persistent atrial fibrillation: Secondary | ICD-10-CM

## 2022-12-02 DIAGNOSIS — Z5181 Encounter for therapeutic drug level monitoring: Secondary | ICD-10-CM | POA: Diagnosis not present

## 2022-12-02 LAB — POCT INR: INR: 1.9 — AB (ref 2.0–3.0)

## 2022-12-02 NOTE — Patient Instructions (Signed)
Description   Take 1.5 tablets today and then resume same dosage of Warfarin 5mg  (1 tablet) daily.  Continue veggies 3 times a week & stay consistent with greens each week.  Recheck INR 4 weeks.  Call us with any new medications or concerns #[phone].

## 2023-01-02 ENCOUNTER — Other Ambulatory Visit: Payer: Self-pay | Admitting: Cardiology

## 2023-01-02 DIAGNOSIS — Z5181 Encounter for therapeutic drug level monitoring: Secondary | ICD-10-CM

## 2023-01-02 MED ORDER — WARFARIN SODIUM 5 MG PO TABS: ORAL_TABLET | ORAL | 1 refills | Status: DC

## 2023-01-02 NOTE — Telephone Encounter (Signed)
Refill request  for warfarin:  Last INR was 1.9 on 12/02/22 Next INR due 01/03/23 LOV was 03/04/22  Refill approved.

## 2023-01-02 NOTE — Addendum Note (Signed)
Addended by: Louanna Raw on: 01/02/2023 10:33 AM   Modules accepted: Orders

## 2023-01-03 ENCOUNTER — Ambulatory Visit: Payer: Medicare Other | Attending: Cardiology

## 2023-01-03 DIAGNOSIS — I4819 Other persistent atrial fibrillation: Secondary | ICD-10-CM

## 2023-01-03 DIAGNOSIS — M3119 Other thrombotic microangiopathy: Secondary | ICD-10-CM | POA: Diagnosis not present

## 2023-01-03 DIAGNOSIS — Z5181 Encounter for therapeutic drug level monitoring: Secondary | ICD-10-CM | POA: Diagnosis not present

## 2023-01-03 LAB — POCT INR: INR: 2.7 (ref 2.0–3.0)

## 2023-01-03 MED ORDER — WARFARIN SODIUM 5 MG PO TABS: ORAL_TABLET | ORAL | 1 refills | Status: DC

## 2023-01-03 NOTE — Patient Instructions (Signed)
Continue 5mg  (1 tablet) daily.  Continue veggies 3 times a week & stay consistent with greens each week.  Recheck INR 6 weeks.  Call us with any new medications or concerns #[phone].

## 2023-01-21 ENCOUNTER — Other Ambulatory Visit: Payer: Self-pay | Admitting: Cardiology

## 2023-01-21 DIAGNOSIS — Z5181 Encounter for therapeutic drug level monitoring: Secondary | ICD-10-CM

## 2023-02-07 ENCOUNTER — Telehealth: Payer: Self-pay | Admitting: Cardiology

## 2023-02-07 DIAGNOSIS — I5032 Chronic diastolic (congestive) heart failure: Secondary | ICD-10-CM

## 2023-02-07 NOTE — Telephone Encounter (Signed)
 Pt c/o swelling/edema: STAT if pt has developed SOB within 24 hours  If swelling, where is the swelling located? Both legs and feet   How much weight have you gained and in what time span? 8-9 not sure of time sp[place]  Have you gained 2 pounds in a day or 5 pounds in a week? Not sure   Do you have a log of your daily weights (if so, list)? no  Are you currently taking a fluid pill? yes  Are you currently SOB? No more than usually  Have you traveled recently in a car or plane for an extended period of time? no

## 2023-02-07 NOTE — Telephone Encounter (Signed)
 Called pt in regards to swelling.  Reports swelling has been going on for about 2 months.  Doesn't weigh herself daily d/t fear of falling.  Also, reports CP and left jaw pain.   Pt reports CP occurred one night last week.  CP occurred while sitting down pt became clammy and weak.  Denied shoulder and arm pain.  Pt expressed she was going to call 911 but wouldn't be able to unlock the door for EMS.   Asked pt to use index finger to push into leg to see if there is an indention. Pt reports leg stayed indented for a little while.  Reports has chronic SOB doesn't think it's worse than normal.  Doesn't have a recent BP reading.   Pt does not take furosemide  as ordered ( 40 mg daily) takes 60 mg on average every other day on off days takes 40 mg.  Doesn't like to take furosemide  d/t cramps.  Wasn't sure if takes potassium 10 mEq daily.  Reports was taken off of spironolactone .   Advised pt if CP occurs to call 911.  Offered pt a DOD visit tomorrow 02/08/23 at 3 pm with Dr. Delford.  Pt refused only wants to see Dr. Jeffrie.  Advised pt with her c/o she needs to be seen ASAP advised Dr. Delford works in same practice as Dr. Jeffrie and call contact him with questions.  Pt reports will see if she can get a ride will call back in the morning if wants appointment.  I scheduled pt with Dr. Jeffrie for 02/17/23 at 2:20 pm.  Again advise pt to call 911 if symptoms worsen.  Will send a message to MD to review and advise.

## 2023-02-08 MED ORDER — SPIRONOLACTONE 25 MG PO TABS: 25.0000 mg | ORAL_TABLET | Freq: Every day | ORAL | 3 refills | Status: DC

## 2023-02-08 MED ORDER — FUROSEMIDE 40 MG PO TABS: 40.0000 mg | ORAL_TABLET | Freq: Two times a day (BID) | ORAL | 3 refills | Status: AC

## 2023-02-08 NOTE — Telephone Encounter (Signed)
 Called pt advised of MD recommendation: Please have her restart the spironolactone  25 mg once a day.  She does not need to take the potassium supplementation if she is on the spironolactone .  Please have her take Lasix  80 mg once a day as well for the time being.   Pt reports would like to take 40 mg PO BID.  Script for spironolactone  25 mg daily sent to pharmacy.  Removed potassium from pt med list.  Pt canceled OV with MD for 02/17/23.  Placed an order for BMP for f/u.  Pt reports will have drawn at [facility] on day of next INR check 02/14/23. Pt had no further questions or concerns.

## 2023-02-14 ENCOUNTER — Ambulatory Visit: Payer: Medicare Other | Attending: Cardiology

## 2023-02-14 DIAGNOSIS — M3119 Other thrombotic microangiopathy: Secondary | ICD-10-CM | POA: Diagnosis not present

## 2023-02-14 DIAGNOSIS — I4819 Other persistent atrial fibrillation: Secondary | ICD-10-CM

## 2023-02-14 DIAGNOSIS — Z5181 Encounter for therapeutic drug level monitoring: Secondary | ICD-10-CM

## 2023-02-14 DIAGNOSIS — I5032 Chronic diastolic (congestive) heart failure: Secondary | ICD-10-CM | POA: Diagnosis not present

## 2023-02-14 LAB — BASIC METABOLIC PANEL
BUN/Creatinine Ratio: 13 (ref 12–28)
BUN: 13 mg/dL (ref 8–27)
CO2: 26 mmol/L (ref 20–29)
Calcium: 9.2 mg/dL (ref 8.7–10.3)
Chloride: 101 mmol/L (ref 96–106)
Creatinine, Ser: 1.03 mg/dL — ABNORMAL HIGH (ref 0.57–1.00)
Glucose: 103 mg/dL — ABNORMAL HIGH (ref 70–99)
Potassium: 4.4 mmol/L (ref 3.5–5.2)
Sodium: 143 mmol/L (ref 134–144)
eGFR: 56 mL/min/{1.73_m2} — ABNORMAL LOW (ref >59)

## 2023-02-14 LAB — POCT INR: INR: 2.3 (ref 2.0–3.0)

## 2023-02-14 NOTE — Patient Instructions (Signed)
 Continue 5mg  (1 tablet) daily.  Continue veggies 3 times a week & stay consistent with greens each week.  Recheck INR 6 weeks.  Call us with any new medications or concerns #[phone].

## 2023-02-17 ENCOUNTER — Ambulatory Visit: Payer: Medicare Other | Admitting: Cardiology

## 2023-02-28 ENCOUNTER — Telehealth: Payer: Self-pay | Admitting: Cardiology

## 2023-02-28 DIAGNOSIS — I5032 Chronic diastolic (congestive) heart failure: Secondary | ICD-10-CM

## 2023-02-28 NOTE — Telephone Encounter (Signed)
Patient said that she got approved to get a special wheel chair and needs Dr. Anne Fu to write a prescription out for patient to get wheelchair

## 2023-02-28 NOTE — Telephone Encounter (Signed)
Patient called back stating her insurance approved her getting a wheelchair but she will need to get a prescription for her doctor to get the wheelchair.

## 2023-02-28 NOTE — Telephone Encounter (Signed)
Pt is requesting an order for a transport wheelchair.  Advised I will forward this to Dr Anne Fu for review. [facility] or [facility].

## 2023-02-28 NOTE — Telephone Encounter (Signed)
Patient called and said that she was approved to get a special wheelchair for her to get around. She needs a note verifying that she can get the wheerchair

## 2023-03-03 NOTE — Telephone Encounter (Signed)
Dr Anne Fu added "light weight wheelchair" to previous order and order re-faxed with confirmation.

## 2023-03-03 NOTE — Telephone Encounter (Signed)
Pt request rx for wheelchair be faxed to [facility]. RX printed and given to Dr Anne Fu to sign

## 2023-03-03 NOTE — Telephone Encounter (Signed)
Pt spoke to [facility] and said you need to specify light weight wheelchair so she can load and unload.

## 2023-03-03 NOTE — Telephone Encounter (Signed)
RX signed by MD and faxed to [facility] @ [phone] with confirmation.

## 2023-03-06 NOTE — Telephone Encounter (Signed)
RX for light weight wheelchair faxed to [facility] with confirmation as requested.

## 2023-03-06 NOTE — Telephone Encounter (Signed)
Please send script to [facility]  Fax #[phone]

## 2023-03-09 ENCOUNTER — Telehealth: Payer: Self-pay | Admitting: Cardiology

## 2023-03-09 DIAGNOSIS — H26493 Other secondary cataract, bilateral: Secondary | ICD-10-CM | POA: Diagnosis not present

## 2023-03-09 DIAGNOSIS — H5203 Hypermetropia, bilateral: Secondary | ICD-10-CM | POA: Diagnosis not present

## 2023-03-09 DIAGNOSIS — H35363 Drusen (degenerative) of macula, bilateral: Secondary | ICD-10-CM | POA: Diagnosis not present

## 2023-03-09 DIAGNOSIS — H52223 Regular astigmatism, bilateral: Secondary | ICD-10-CM | POA: Diagnosis not present

## 2023-03-09 NOTE — Telephone Encounter (Signed)
 Pt called back today stating [facility] has not received the wheelchair order.  Order faxed again to [facility] [phone] with confirmation at 2:42 pm.       3:02 pm - attempted to contact pt again at phone number left in this message.  No answer and no voicemail.      Please see the telephone dated 03/09/23 for further information.

## 2023-03-09 NOTE — Telephone Encounter (Signed)
 Order faxed again to [facility] [phone] with confirmation at 2:42 pm.     3:02 pm - attempted to contact pt again at phone number left in this message.  No answer and no voicemail.

## 2023-03-09 NOTE — Telephone Encounter (Signed)
 Attempted to contact pt X 2 - multiple rings, no answer and no voice mail to leave a message.  This order was faxed 03/06/2023 to [facility] with confirmation.  Will fax it again.

## 2023-03-09 NOTE — Telephone Encounter (Signed)
 Pt is calling back saying [facility] is telling her they still have not received order for light weight wheelchair. Please advise

## 2023-03-21 DIAGNOSIS — M6281 Muscle weakness (generalized): Secondary | ICD-10-CM | POA: Diagnosis not present

## 2023-03-21 DIAGNOSIS — Z9181 History of falling: Secondary | ICD-10-CM | POA: Diagnosis not present

## 2023-03-21 DIAGNOSIS — R2681 Unsteadiness on feet: Secondary | ICD-10-CM | POA: Diagnosis not present

## 2023-03-21 DIAGNOSIS — I5033 Acute on chronic diastolic (congestive) heart failure: Secondary | ICD-10-CM | POA: Diagnosis not present

## 2023-03-22 NOTE — Telephone Encounter (Signed)
 Pt has not called back again regarding wheelchair order.  Will close this encounter and await a call back if she needs further assistance.

## 2023-03-28 ENCOUNTER — Ambulatory Visit: Payer: Medicare Other | Attending: Cardiology | Admitting: *Deleted

## 2023-03-28 DIAGNOSIS — M3119 Other thrombotic microangiopathy: Secondary | ICD-10-CM | POA: Diagnosis not present

## 2023-03-28 DIAGNOSIS — I4819 Other persistent atrial fibrillation: Secondary | ICD-10-CM | POA: Diagnosis not present

## 2023-03-28 DIAGNOSIS — Z5181 Encounter for therapeutic drug level monitoring: Secondary | ICD-10-CM | POA: Diagnosis not present

## 2023-03-28 LAB — POCT INR: INR: 2.8 (ref 2.0–3.0)

## 2023-03-28 NOTE — Patient Instructions (Signed)
 Description   Continue 5mg  (1 tablet) daily.  Continue veggies 3 times a week & stay consistent with greens each week.  Recheck INR 6 weeks.  Call us with any new medications or concerns #[phone].

## 2023-03-30 ENCOUNTER — Other Ambulatory Visit: Payer: Self-pay | Admitting: Cardiology

## 2023-03-30 NOTE — Telephone Encounter (Signed)
 Dr. Judd Gaudier pt. Does Dr. Anne Fu want to refill Furosemide 80mg  or stay at Furosemide 40mg ?

## 2023-04-17 DIAGNOSIS — E039 Hypothyroidism, unspecified: Secondary | ICD-10-CM | POA: Diagnosis not present

## 2023-04-18 DIAGNOSIS — I5033 Acute on chronic diastolic (congestive) heart failure: Secondary | ICD-10-CM | POA: Diagnosis not present

## 2023-05-08 ENCOUNTER — Ambulatory Visit: Attending: Cardiology

## 2023-05-08 DIAGNOSIS — Z5181 Encounter for therapeutic drug level monitoring: Secondary | ICD-10-CM | POA: Diagnosis not present

## 2023-05-08 DIAGNOSIS — I4819 Other persistent atrial fibrillation: Secondary | ICD-10-CM

## 2023-05-08 DIAGNOSIS — M3119 Other thrombotic microangiopathy: Secondary | ICD-10-CM

## 2023-05-08 LAB — POCT INR: INR: 2.3 (ref 2.0–3.0)

## 2023-05-08 NOTE — Patient Instructions (Signed)
 Continue 5mg  (1 tablet) daily.  Continue veggies 3 times a week & stay consistent with greens each week.  Recheck INR 6 weeks.  Call us with any new medications or concerns #[phone].

## 2023-05-09 ENCOUNTER — Ambulatory Visit: Payer: Medicare Other

## 2023-05-19 DIAGNOSIS — R2681 Unsteadiness on feet: Secondary | ICD-10-CM | POA: Diagnosis not present

## 2023-05-19 DIAGNOSIS — I5033 Acute on chronic diastolic (congestive) heart failure: Secondary | ICD-10-CM | POA: Diagnosis not present

## 2023-05-19 DIAGNOSIS — Z9181 History of falling: Secondary | ICD-10-CM | POA: Diagnosis not present

## 2023-05-23 ENCOUNTER — Ambulatory Visit: Payer: Medicare Other | Attending: Cardiology | Admitting: Cardiology

## 2023-05-24 ENCOUNTER — Encounter: Payer: Self-pay | Admitting: Cardiology

## 2023-06-15 ENCOUNTER — Ambulatory Visit: Attending: Cardiology

## 2023-06-15 DIAGNOSIS — I4819 Other persistent atrial fibrillation: Secondary | ICD-10-CM | POA: Diagnosis not present

## 2023-06-15 DIAGNOSIS — Z5181 Encounter for therapeutic drug level monitoring: Secondary | ICD-10-CM | POA: Diagnosis not present

## 2023-06-15 LAB — POCT INR: INR: 2.2 (ref 2.0–3.0)

## 2023-06-15 NOTE — Patient Instructions (Addendum)
 Description   Continue 5mg  (1 tablet) daily.  Continue veggies 3 times a week & stay consistent with greens each week.  Recheck INR 6 weeks.  Call us with any new medications or concerns #[phone].

## 2023-06-18 DIAGNOSIS — Z9181 History of falling: Secondary | ICD-10-CM | POA: Diagnosis not present

## 2023-06-18 DIAGNOSIS — I5033 Acute on chronic diastolic (congestive) heart failure: Secondary | ICD-10-CM | POA: Diagnosis not present

## 2023-06-18 DIAGNOSIS — R2681 Unsteadiness on feet: Secondary | ICD-10-CM | POA: Diagnosis not present

## 2023-06-19 ENCOUNTER — Encounter

## 2023-07-10 ENCOUNTER — Other Ambulatory Visit: Payer: Self-pay | Admitting: Cardiology

## 2023-07-19 DIAGNOSIS — Z9181 History of falling: Secondary | ICD-10-CM | POA: Diagnosis not present

## 2023-07-19 DIAGNOSIS — I5033 Acute on chronic diastolic (congestive) heart failure: Secondary | ICD-10-CM | POA: Diagnosis not present

## 2023-07-19 DIAGNOSIS — R2681 Unsteadiness on feet: Secondary | ICD-10-CM | POA: Diagnosis not present

## 2023-07-28 ENCOUNTER — Ambulatory Visit: Attending: Cardiology

## 2023-07-28 DIAGNOSIS — I4819 Other persistent atrial fibrillation: Secondary | ICD-10-CM

## 2023-07-28 DIAGNOSIS — M3119 Other thrombotic microangiopathy: Secondary | ICD-10-CM | POA: Diagnosis not present

## 2023-07-28 DIAGNOSIS — Z5181 Encounter for therapeutic drug level monitoring: Secondary | ICD-10-CM | POA: Diagnosis not present

## 2023-07-28 LAB — POCT INR: INR: 2.1 (ref 2.0–3.0)

## 2023-07-28 NOTE — Progress Notes (Signed)
Please see anticoagulation encounter.

## 2023-07-28 NOTE — Patient Instructions (Signed)
 Continue 5mg  (1 tablet) daily.  Continue veggies 3 times a week & stay consistent with greens each week.  Recheck INR 6 weeks.  Call us with any new medications or concerns #[phone].

## 2023-08-18 DIAGNOSIS — Z9181 History of falling: Secondary | ICD-10-CM | POA: Diagnosis not present

## 2023-08-18 DIAGNOSIS — I5033 Acute on chronic diastolic (congestive) heart failure: Secondary | ICD-10-CM | POA: Diagnosis not present

## 2023-08-18 DIAGNOSIS — R2681 Unsteadiness on feet: Secondary | ICD-10-CM | POA: Diagnosis not present

## 2023-09-07 ENCOUNTER — Ambulatory Visit: Attending: Cardiology | Admitting: *Deleted

## 2023-09-07 ENCOUNTER — Ambulatory Visit: Attending: Cardiology | Admitting: Cardiology

## 2023-09-07 ENCOUNTER — Encounter: Payer: Self-pay | Admitting: Cardiology

## 2023-09-07 VITALS — BP 92/52 | HR 98 | Ht 64.0 in | Wt 265.0 lb

## 2023-09-07 DIAGNOSIS — Z5181 Encounter for therapeutic drug level monitoring: Secondary | ICD-10-CM | POA: Diagnosis not present

## 2023-09-07 DIAGNOSIS — M3119 Other thrombotic microangiopathy: Secondary | ICD-10-CM | POA: Diagnosis not present

## 2023-09-07 DIAGNOSIS — I4819 Other persistent atrial fibrillation: Secondary | ICD-10-CM

## 2023-09-07 DIAGNOSIS — R5381 Other malaise: Secondary | ICD-10-CM | POA: Diagnosis not present

## 2023-09-07 LAB — POCT INR: POC INR: 2

## 2023-09-07 NOTE — Progress Notes (Signed)
 Cardiology Office Note:  .   Date:  09/07/2023  ID:  [name], DOB 1946/01/10, MRN [id number] PCP: Boneta, [place]  E, PA-C  [facility] Providers Cardiologist:  Oneil Parchment, MD Electrophysiologist:  Will Gladis Norton, MD     History of Present Illness: .   [name] is a 78 y.o. female Discussed the use of AI scribe software for clinical note transcription with the patient, who gave verbal consent to proceed.  History of Present Illness [name] is a 78 year old female with coronary artery disease who presents for cardiovascular follow-up.  She has a history of coronary artery disease with a drug-eluting stent placed in the mid LAD in 2017 due to 80% stenosis. A left heart catheterization at that time also showed 50% stenosis in the first diagonal artery. An echocardiogram on December 26, 2021, revealed an ejection fraction of 50-60% with moderately dilated left and right atria and mild mitral valve regurgitation. A stress test in 2020 was low risk.  She feels 'bad all the time' and notes low blood pressure, which led to a reduction in her medications. She is currently taking four pills, including spironolactone , which she finds difficult to cut due to its small size. She recalls a past experience of being overdosed on medications at a nursing home.  She has a history of thyroid  issues and reports not receiving her thyroid  medication while at the nursing home, leading her to stop taking it. She later visited a medical doctor who expressed concern about her thyroid  levels.  She experiences leg swelling that improves overnight but persists during the day. Frequent urination makes it difficult for her to rest in the afternoon.      Studies Reviewed: SABRA   EKG Interpretation Date/Time:  Thursday September 07 2023 15:42:43 EDT Ventricular Rate:  98 PR Interval:    QRS Duration:  84 QT Interval:  334 QTC Calculation: 426 R Axis:   116  Text  Interpretation: Atrial fibrillation with premature ventricular or aberrantly conducted complexes Left posterior fascicular block When compared with ECG of 02-Jan-2022 12:35, No significant change was found Confirmed by Parchment Oneil ([id number]) on 09/07/2023 3:55:52 PM    Results DIAGNOSTIC Echocardiogram: EF 50-60%, moderately dilated left atrium, moderately dilated right atrium, mild mitral valve regurgitation, no aortic stenosis (12/26/2021) Risk Assessment/Calculations:            Physical Exam:   VS:  BP (!) 92/52   Pulse 98   Ht 5' 4 (1.626 m)   Wt 265 lb (120.2 kg)   SpO2 96%   BMI 45.49 kg/m    Wt Readings from Last 3 Encounters:  09/07/23 265 lb (120.2 kg)  03/04/22 245 lb (111.1 kg)  01/03/22 252 lb 3.3 oz (114.4 kg)    GEN: Well nourished, well developed in no acute distress, in wheelchair NECK: No JVD; No carotid bruits CARDIAC: RRR, no murmurs, no rubs, no gallops RESPIRATORY:  Clear to auscultation without rales, wheezing or rhonchi  ABDOMEN: Soft, non-tender, non-distended EXTREMITIES:  No edema; No deformity   ASSESSMENT AND PLAN: .    Assessment and Plan Assessment & Plan Chronic diastolic congestive heart failure Chronic diastolic heart failure with persistent leg edema, improving overnight but recurring in the morning. Blood pressure is slightly hypotensive, but she is asymptomatic. Discussion about reducing medication load due to potential overdosing at the nursing home. She expressed a desire to reduce medication load and improve her strength. - Discontinue spironolactone  - Arrange  for home health care to assist with strengthening  Coronary artery disease, status post drug-eluting stent to mid LAD Coronary artery disease with drug-eluting stent in the mid LAD. No current angina. Previous stress test in 2020 was low risk.  Persistent atrial fibrillation Currently in rate controlled atrial fibrillation detected. She reports feeling well.  Chronic  anticoagulation Coumadin -last INR 2.1 at outside office.         Signed, Oneil Parchment, MD

## 2023-09-07 NOTE — Patient Instructions (Signed)
 Description   Take 1.5 tablets of warfarin today then continue 5mg  (1 tablet) daily.  Continue veggies 3 times a week & stay consistent with greens each week.  Recheck INR 6 weeks.  Call us  with any new medications or concerns [phone].

## 2023-09-07 NOTE — Patient Instructions (Signed)
 Medication Instructions:  Please discontinue your Spironolactone . Continue all other medications as listed.  *If you need a refill on your cardiac medications before your next appointment, please call your pharmacy*  You have been referred for Home Health for Physical Therapy.  Follow-Up: At [name], you and your health needs are our priority.  As part of our continuing mission to provide you with exceptional heart care, our providers are all part of one team.  This team includes your primary Cardiologist (physician) and Advanced Practice Providers or APPs (Physician Assistants and Nurse Practitioners) who all work together to provide you with the care you need, when you need it.  Your next appointment:   1 year(s)  Provider:   Oneil Parchment, MD    We recommend signing up for the patient portal called MyChart.  Sign up information is provided on this After Visit Summary.  MyChart is used to connect with patients for Virtual Visits (Telemedicine).  Patients are able to view lab/test results, encounter notes, upcoming appointments, etc.  Non-urgent messages can be sent to your provider as well.   To learn more about what you can do with MyChart, go to [email or web address].

## 2023-09-07 NOTE — Progress Notes (Signed)
 INR 2.0,  Please see anticoagulation encounter

## 2023-09-13 ENCOUNTER — Encounter: Payer: Self-pay | Admitting: Cardiology

## 2023-09-13 ENCOUNTER — Telehealth: Payer: Self-pay | Admitting: Cardiology

## 2023-09-13 NOTE — Telephone Encounter (Signed)
 Called and spoke with pt and she stated that no one has called her yet but she was reading on her AVS from her last OV that a referral has been placed and she wanted [facility]. Explained to pt that someone from referrals will be calling her in the near future and she can give this information to them to discuss. Pt verbalized understanding and had no further questions or concerns at this time.

## 2023-09-13 NOTE — Telephone Encounter (Signed)
 error

## 2023-09-13 NOTE — Telephone Encounter (Signed)
  Patient calling stating she was speaking with someone at our office regarding getting her some home health help. She accidentally hung up on the person but not sure who she was speaking with.

## 2023-09-16 DIAGNOSIS — J811 Chronic pulmonary edema: Secondary | ICD-10-CM | POA: Diagnosis not present

## 2023-09-16 DIAGNOSIS — E039 Hypothyroidism, unspecified: Secondary | ICD-10-CM | POA: Diagnosis not present

## 2023-09-16 DIAGNOSIS — I351 Nonrheumatic aortic (valve) insufficiency: Secondary | ICD-10-CM | POA: Diagnosis not present

## 2023-09-16 DIAGNOSIS — E7800 Pure hypercholesterolemia, unspecified: Secondary | ICD-10-CM | POA: Diagnosis not present

## 2023-09-16 DIAGNOSIS — I358 Other nonrheumatic aortic valve disorders: Secondary | ICD-10-CM | POA: Diagnosis not present

## 2023-09-16 DIAGNOSIS — Z8679 Personal history of other diseases of the circulatory system: Secondary | ICD-10-CM | POA: Diagnosis not present

## 2023-09-16 DIAGNOSIS — I4891 Unspecified atrial fibrillation: Secondary | ICD-10-CM | POA: Diagnosis not present

## 2023-09-16 DIAGNOSIS — M6281 Muscle weakness (generalized): Secondary | ICD-10-CM | POA: Diagnosis not present

## 2023-09-16 DIAGNOSIS — I517 Cardiomegaly: Secondary | ICD-10-CM | POA: Diagnosis not present

## 2023-09-16 DIAGNOSIS — D75839 Thrombocytosis, unspecified: Secondary | ICD-10-CM | POA: Diagnosis not present

## 2023-09-16 DIAGNOSIS — R531 Weakness: Secondary | ICD-10-CM | POA: Diagnosis not present

## 2023-09-16 DIAGNOSIS — Z888 Allergy status to other drugs, medicaments and biological substances status: Secondary | ICD-10-CM | POA: Diagnosis not present

## 2023-09-16 DIAGNOSIS — J984 Other disorders of lung: Secondary | ICD-10-CM | POA: Diagnosis not present

## 2023-09-16 DIAGNOSIS — Z9049 Acquired absence of other specified parts of digestive tract: Secondary | ICD-10-CM | POA: Diagnosis not present

## 2023-09-16 DIAGNOSIS — R509 Fever, unspecified: Secondary | ICD-10-CM | POA: Diagnosis not present

## 2023-09-16 DIAGNOSIS — E038 Other specified hypothyroidism: Secondary | ICD-10-CM | POA: Diagnosis not present

## 2023-09-16 DIAGNOSIS — K859 Acute pancreatitis without necrosis or infection, unspecified: Secondary | ICD-10-CM | POA: Diagnosis not present

## 2023-09-16 DIAGNOSIS — Z9181 History of falling: Secondary | ICD-10-CM | POA: Diagnosis not present

## 2023-09-16 DIAGNOSIS — I951 Orthostatic hypotension: Secondary | ICD-10-CM | POA: Diagnosis not present

## 2023-09-16 DIAGNOSIS — Z66 Do not resuscitate: Secondary | ICD-10-CM | POA: Diagnosis not present

## 2023-09-16 DIAGNOSIS — R2681 Unsteadiness on feet: Secondary | ICD-10-CM | POA: Diagnosis not present

## 2023-09-16 DIAGNOSIS — I5033 Acute on chronic diastolic (congestive) heart failure: Secondary | ICD-10-CM | POA: Diagnosis not present

## 2023-09-16 DIAGNOSIS — R918 Other nonspecific abnormal finding of lung field: Secondary | ICD-10-CM | POA: Diagnosis not present

## 2023-09-16 DIAGNOSIS — Z9849 Cataract extraction status, unspecified eye: Secondary | ICD-10-CM | POA: Diagnosis not present

## 2023-09-16 DIAGNOSIS — E876 Hypokalemia: Secondary | ICD-10-CM | POA: Diagnosis not present

## 2023-09-16 DIAGNOSIS — Q8901 Asplenia (congenital): Secondary | ICD-10-CM | POA: Diagnosis not present

## 2023-09-16 DIAGNOSIS — Z880 Allergy status to penicillin: Secondary | ICD-10-CM | POA: Diagnosis not present

## 2023-09-16 DIAGNOSIS — I509 Heart failure, unspecified: Secondary | ICD-10-CM | POA: Diagnosis not present

## 2023-09-16 DIAGNOSIS — I4819 Other persistent atrial fibrillation: Secondary | ICD-10-CM | POA: Diagnosis not present

## 2023-09-16 DIAGNOSIS — Z881 Allergy status to other antibiotic agents status: Secondary | ICD-10-CM | POA: Diagnosis not present

## 2023-09-16 DIAGNOSIS — Z7901 Long term (current) use of anticoagulants: Secondary | ICD-10-CM | POA: Diagnosis not present

## 2023-09-16 DIAGNOSIS — K8689 Other specified diseases of pancreas: Secondary | ICD-10-CM | POA: Diagnosis not present

## 2023-09-16 DIAGNOSIS — I110 Hypertensive heart disease with heart failure: Secondary | ICD-10-CM | POA: Diagnosis not present

## 2023-09-16 DIAGNOSIS — Z79899 Other long term (current) drug therapy: Secondary | ICD-10-CM | POA: Diagnosis not present

## 2023-09-16 DIAGNOSIS — R54 Age-related physical debility: Secondary | ICD-10-CM | POA: Diagnosis not present

## 2023-09-21 DIAGNOSIS — Z7901 Long term (current) use of anticoagulants: Secondary | ICD-10-CM | POA: Diagnosis not present

## 2023-09-21 DIAGNOSIS — I4819 Other persistent atrial fibrillation: Secondary | ICD-10-CM | POA: Diagnosis not present

## 2023-09-21 DIAGNOSIS — I110 Hypertensive heart disease with heart failure: Secondary | ICD-10-CM | POA: Diagnosis not present

## 2023-09-21 DIAGNOSIS — I50810 Right heart failure, unspecified: Secondary | ICD-10-CM | POA: Diagnosis not present

## 2023-09-21 DIAGNOSIS — Z86718 Personal history of other venous thrombosis and embolism: Secondary | ICD-10-CM | POA: Diagnosis not present

## 2023-09-21 DIAGNOSIS — E039 Hypothyroidism, unspecified: Secondary | ICD-10-CM | POA: Diagnosis not present

## 2023-09-21 DIAGNOSIS — Z5181 Encounter for therapeutic drug level monitoring: Secondary | ICD-10-CM | POA: Diagnosis not present

## 2023-09-21 DIAGNOSIS — I5033 Acute on chronic diastolic (congestive) heart failure: Secondary | ICD-10-CM | POA: Diagnosis not present

## 2023-09-27 DIAGNOSIS — Z7901 Long term (current) use of anticoagulants: Secondary | ICD-10-CM | POA: Diagnosis not present

## 2023-09-27 DIAGNOSIS — Z5181 Encounter for therapeutic drug level monitoring: Secondary | ICD-10-CM | POA: Diagnosis not present

## 2023-09-27 DIAGNOSIS — I110 Hypertensive heart disease with heart failure: Secondary | ICD-10-CM | POA: Diagnosis not present

## 2023-09-27 DIAGNOSIS — I4819 Other persistent atrial fibrillation: Secondary | ICD-10-CM | POA: Diagnosis not present

## 2023-09-27 DIAGNOSIS — I50810 Right heart failure, unspecified: Secondary | ICD-10-CM | POA: Diagnosis not present

## 2023-09-27 DIAGNOSIS — Z86718 Personal history of other venous thrombosis and embolism: Secondary | ICD-10-CM | POA: Diagnosis not present

## 2023-09-27 DIAGNOSIS — E039 Hypothyroidism, unspecified: Secondary | ICD-10-CM | POA: Diagnosis not present

## 2023-09-27 DIAGNOSIS — I5033 Acute on chronic diastolic (congestive) heart failure: Secondary | ICD-10-CM | POA: Diagnosis not present

## 2023-10-03 DIAGNOSIS — I4819 Other persistent atrial fibrillation: Secondary | ICD-10-CM | POA: Diagnosis not present

## 2023-10-03 DIAGNOSIS — Z7901 Long term (current) use of anticoagulants: Secondary | ICD-10-CM | POA: Diagnosis not present

## 2023-10-03 DIAGNOSIS — Z86718 Personal history of other venous thrombosis and embolism: Secondary | ICD-10-CM | POA: Diagnosis not present

## 2023-10-03 DIAGNOSIS — I50810 Right heart failure, unspecified: Secondary | ICD-10-CM | POA: Diagnosis not present

## 2023-10-03 DIAGNOSIS — I110 Hypertensive heart disease with heart failure: Secondary | ICD-10-CM | POA: Diagnosis not present

## 2023-10-03 DIAGNOSIS — Z5181 Encounter for therapeutic drug level monitoring: Secondary | ICD-10-CM | POA: Diagnosis not present

## 2023-10-03 DIAGNOSIS — E039 Hypothyroidism, unspecified: Secondary | ICD-10-CM | POA: Diagnosis not present

## 2023-10-03 DIAGNOSIS — I5033 Acute on chronic diastolic (congestive) heart failure: Secondary | ICD-10-CM | POA: Diagnosis not present

## 2023-10-06 DIAGNOSIS — E039 Hypothyroidism, unspecified: Secondary | ICD-10-CM | POA: Diagnosis not present

## 2023-10-06 DIAGNOSIS — Z7901 Long term (current) use of anticoagulants: Secondary | ICD-10-CM | POA: Diagnosis not present

## 2023-10-06 DIAGNOSIS — I110 Hypertensive heart disease with heart failure: Secondary | ICD-10-CM | POA: Diagnosis not present

## 2023-10-06 DIAGNOSIS — I5033 Acute on chronic diastolic (congestive) heart failure: Secondary | ICD-10-CM | POA: Diagnosis not present

## 2023-10-06 DIAGNOSIS — Z86718 Personal history of other venous thrombosis and embolism: Secondary | ICD-10-CM | POA: Diagnosis not present

## 2023-10-06 DIAGNOSIS — Z5181 Encounter for therapeutic drug level monitoring: Secondary | ICD-10-CM | POA: Diagnosis not present

## 2023-10-06 DIAGNOSIS — I4819 Other persistent atrial fibrillation: Secondary | ICD-10-CM | POA: Diagnosis not present

## 2023-10-06 DIAGNOSIS — I50810 Right heart failure, unspecified: Secondary | ICD-10-CM | POA: Diagnosis not present

## 2023-10-11 DIAGNOSIS — I5033 Acute on chronic diastolic (congestive) heart failure: Secondary | ICD-10-CM | POA: Diagnosis not present

## 2023-10-11 DIAGNOSIS — I50810 Right heart failure, unspecified: Secondary | ICD-10-CM | POA: Diagnosis not present

## 2023-10-11 DIAGNOSIS — I110 Hypertensive heart disease with heart failure: Secondary | ICD-10-CM | POA: Diagnosis not present

## 2023-10-11 DIAGNOSIS — Z86718 Personal history of other venous thrombosis and embolism: Secondary | ICD-10-CM | POA: Diagnosis not present

## 2023-10-11 DIAGNOSIS — I4819 Other persistent atrial fibrillation: Secondary | ICD-10-CM | POA: Diagnosis not present

## 2023-10-11 DIAGNOSIS — Z7901 Long term (current) use of anticoagulants: Secondary | ICD-10-CM | POA: Diagnosis not present

## 2023-10-11 DIAGNOSIS — E039 Hypothyroidism, unspecified: Secondary | ICD-10-CM | POA: Diagnosis not present

## 2023-10-11 DIAGNOSIS — Z5181 Encounter for therapeutic drug level monitoring: Secondary | ICD-10-CM | POA: Diagnosis not present

## 2023-10-12 DIAGNOSIS — I110 Hypertensive heart disease with heart failure: Secondary | ICD-10-CM | POA: Diagnosis not present

## 2023-10-12 DIAGNOSIS — E039 Hypothyroidism, unspecified: Secondary | ICD-10-CM | POA: Diagnosis not present

## 2023-10-12 DIAGNOSIS — I5033 Acute on chronic diastolic (congestive) heart failure: Secondary | ICD-10-CM | POA: Diagnosis not present

## 2023-10-12 DIAGNOSIS — I50810 Right heart failure, unspecified: Secondary | ICD-10-CM | POA: Diagnosis not present

## 2023-10-12 DIAGNOSIS — Z7901 Long term (current) use of anticoagulants: Secondary | ICD-10-CM | POA: Diagnosis not present

## 2023-10-12 DIAGNOSIS — Z86718 Personal history of other venous thrombosis and embolism: Secondary | ICD-10-CM | POA: Diagnosis not present

## 2023-10-12 DIAGNOSIS — Z5181 Encounter for therapeutic drug level monitoring: Secondary | ICD-10-CM | POA: Diagnosis not present

## 2023-10-12 DIAGNOSIS — I4819 Other persistent atrial fibrillation: Secondary | ICD-10-CM | POA: Diagnosis not present

## 2023-10-17 DIAGNOSIS — E039 Hypothyroidism, unspecified: Secondary | ICD-10-CM | POA: Diagnosis not present

## 2023-10-17 DIAGNOSIS — I110 Hypertensive heart disease with heart failure: Secondary | ICD-10-CM | POA: Diagnosis not present

## 2023-10-17 DIAGNOSIS — Z86718 Personal history of other venous thrombosis and embolism: Secondary | ICD-10-CM | POA: Diagnosis not present

## 2023-10-17 DIAGNOSIS — I4819 Other persistent atrial fibrillation: Secondary | ICD-10-CM | POA: Diagnosis not present

## 2023-10-17 DIAGNOSIS — Z7901 Long term (current) use of anticoagulants: Secondary | ICD-10-CM | POA: Diagnosis not present

## 2023-10-17 DIAGNOSIS — I5033 Acute on chronic diastolic (congestive) heart failure: Secondary | ICD-10-CM | POA: Diagnosis not present

## 2023-10-17 DIAGNOSIS — Z5181 Encounter for therapeutic drug level monitoring: Secondary | ICD-10-CM | POA: Diagnosis not present

## 2023-10-17 DIAGNOSIS — I50810 Right heart failure, unspecified: Secondary | ICD-10-CM | POA: Diagnosis not present

## 2023-10-18 ENCOUNTER — Ambulatory Visit (INDEPENDENT_AMBULATORY_CARE_PROVIDER_SITE_OTHER)

## 2023-10-18 DIAGNOSIS — E039 Hypothyroidism, unspecified: Secondary | ICD-10-CM | POA: Diagnosis not present

## 2023-10-18 DIAGNOSIS — M3119 Other thrombotic microangiopathy: Secondary | ICD-10-CM

## 2023-10-18 DIAGNOSIS — Z5181 Encounter for therapeutic drug level monitoring: Secondary | ICD-10-CM | POA: Diagnosis not present

## 2023-10-18 DIAGNOSIS — I110 Hypertensive heart disease with heart failure: Secondary | ICD-10-CM | POA: Diagnosis not present

## 2023-10-18 DIAGNOSIS — I50810 Right heart failure, unspecified: Secondary | ICD-10-CM | POA: Diagnosis not present

## 2023-10-18 DIAGNOSIS — I5033 Acute on chronic diastolic (congestive) heart failure: Secondary | ICD-10-CM | POA: Diagnosis not present

## 2023-10-18 DIAGNOSIS — Z7901 Long term (current) use of anticoagulants: Secondary | ICD-10-CM | POA: Diagnosis not present

## 2023-10-18 DIAGNOSIS — Z86718 Personal history of other venous thrombosis and embolism: Secondary | ICD-10-CM | POA: Diagnosis not present

## 2023-10-18 DIAGNOSIS — I4819 Other persistent atrial fibrillation: Secondary | ICD-10-CM | POA: Diagnosis not present

## 2023-10-18 LAB — POCT INR: INR: 2.1 (ref 2.0–3.0)

## 2023-10-18 NOTE — Patient Instructions (Signed)
 Description   INR 2.1, Spoke with [facility] RN who will be in pt's home x 9 weeks, advised to have pt continue same dosage of Warfarin 5mg  (1 tablet) daily.  Continue veggies 3 times a week & stay consistent with greens each week.  Recheck INR 4 weeks.  Call us  with any new medications or concerns [phone].

## 2023-10-18 NOTE — Progress Notes (Signed)
 Description   INR 2.1, Spoke with [facility] RN who will be in pt's home x 9 weeks, advised to have pt continue same dosage of Warfarin 5mg  (1 tablet) daily.  Continue veggies 3 times a week & stay consistent with greens each week.  Recheck INR 4 weeks.  Call us  with any new medications or concerns [phone].

## 2023-10-19 DIAGNOSIS — R2681 Unsteadiness on feet: Secondary | ICD-10-CM | POA: Diagnosis not present

## 2023-10-19 DIAGNOSIS — Z9181 History of falling: Secondary | ICD-10-CM | POA: Diagnosis not present

## 2023-10-19 DIAGNOSIS — I5033 Acute on chronic diastolic (congestive) heart failure: Secondary | ICD-10-CM | POA: Diagnosis not present

## 2023-10-20 ENCOUNTER — Ambulatory Visit

## 2023-10-26 DIAGNOSIS — I50810 Right heart failure, unspecified: Secondary | ICD-10-CM | POA: Diagnosis not present

## 2023-10-26 DIAGNOSIS — Z7901 Long term (current) use of anticoagulants: Secondary | ICD-10-CM | POA: Diagnosis not present

## 2023-10-26 DIAGNOSIS — Z5181 Encounter for therapeutic drug level monitoring: Secondary | ICD-10-CM | POA: Diagnosis not present

## 2023-10-26 DIAGNOSIS — I5033 Acute on chronic diastolic (congestive) heart failure: Secondary | ICD-10-CM | POA: Diagnosis not present

## 2023-10-26 DIAGNOSIS — E039 Hypothyroidism, unspecified: Secondary | ICD-10-CM | POA: Diagnosis not present

## 2023-10-26 DIAGNOSIS — Z86718 Personal history of other venous thrombosis and embolism: Secondary | ICD-10-CM | POA: Diagnosis not present

## 2023-10-26 DIAGNOSIS — I4819 Other persistent atrial fibrillation: Secondary | ICD-10-CM | POA: Diagnosis not present

## 2023-10-26 DIAGNOSIS — I110 Hypertensive heart disease with heart failure: Secondary | ICD-10-CM | POA: Diagnosis not present

## 2023-10-28 DIAGNOSIS — I110 Hypertensive heart disease with heart failure: Secondary | ICD-10-CM | POA: Diagnosis not present

## 2023-10-28 DIAGNOSIS — I4819 Other persistent atrial fibrillation: Secondary | ICD-10-CM | POA: Diagnosis not present

## 2023-10-28 DIAGNOSIS — Z86718 Personal history of other venous thrombosis and embolism: Secondary | ICD-10-CM | POA: Diagnosis not present

## 2023-10-28 DIAGNOSIS — Z5181 Encounter for therapeutic drug level monitoring: Secondary | ICD-10-CM | POA: Diagnosis not present

## 2023-10-28 DIAGNOSIS — Z7901 Long term (current) use of anticoagulants: Secondary | ICD-10-CM | POA: Diagnosis not present

## 2023-10-28 DIAGNOSIS — I5033 Acute on chronic diastolic (congestive) heart failure: Secondary | ICD-10-CM | POA: Diagnosis not present

## 2023-10-28 DIAGNOSIS — I50810 Right heart failure, unspecified: Secondary | ICD-10-CM | POA: Diagnosis not present

## 2023-10-28 DIAGNOSIS — E039 Hypothyroidism, unspecified: Secondary | ICD-10-CM | POA: Diagnosis not present

## 2023-11-02 ENCOUNTER — Telehealth: Payer: Self-pay | Admitting: Cardiology

## 2023-11-02 NOTE — Telephone Encounter (Signed)
 Nurse from [facility] calling to report pts INR: 2.0

## 2023-11-07 NOTE — Telephone Encounter (Signed)
 Called and spoke with Jaleesa with [facility] heath. She reports this was last weeks result. Next INR due 10/15

## 2023-11-15 ENCOUNTER — Ambulatory Visit (INDEPENDENT_AMBULATORY_CARE_PROVIDER_SITE_OTHER): Payer: Self-pay | Admitting: Pharmacist

## 2023-11-15 DIAGNOSIS — Z5181 Encounter for therapeutic drug level monitoring: Secondary | ICD-10-CM

## 2023-11-15 DIAGNOSIS — M3119 Other thrombotic microangiopathy: Secondary | ICD-10-CM

## 2023-11-15 LAB — POCT INR: INR: 2.6 (ref 2.0–3.0)

## 2023-11-15 NOTE — Patient Instructions (Addendum)
 Description   INR 2.6, Spoke with Leita @ [facility] RN who will be in pt's home x 9 weeks, advised to have pt continue same dosage of Warfarin 5mg  (1 tablet) daily.  Continue veggies 3 times a week & stay consistent with greens each week.  Recheck INR 4 weeks.  Call us  with any new medications or concerns [phone].

## 2023-11-15 NOTE — Progress Notes (Signed)
 Description   INR 2.6, Spoke with Leita @ [facility] RN who will be in pt's home x 9 weeks, advised to have pt continue same dosage of Warfarin 5mg  (1 tablet) daily.  Continue veggies 3 times a week & stay consistent with greens each week.  Recheck INR 4 weeks.  Call us  with any new medications or concerns [phone].

## 2023-11-16 ENCOUNTER — Telehealth: Payer: Self-pay | Admitting: *Deleted

## 2023-11-16 MED ORDER — METOPROLOL SUCCINATE ER 50 MG PO TB24: 50.0000 mg | ORAL_TABLET | Freq: Every day | ORAL | 3 refills | Status: AC

## 2023-11-16 NOTE — Telephone Encounter (Addendum)
 Pt called and stated that she needs refill for her Metoprolol  Succinate (Toprol -XL) 50mg .   Pt stated that she was recently in the hospital and she tried to get the medication refilled from the Dr she saw at [facility] but she was told by the pharmacy that her cardiologist would need to send in the refill.   Pt would like a 90 day supply sent to the [facility] off of [address].

## 2023-11-16 NOTE — Telephone Encounter (Signed)
 Pt's medication was sent to pt's pharmacy as requested. Confirmation received.

## 2023-12-13 ENCOUNTER — Ambulatory Visit (INDEPENDENT_AMBULATORY_CARE_PROVIDER_SITE_OTHER): Admitting: *Deleted

## 2023-12-13 DIAGNOSIS — Z5181 Encounter for therapeutic drug level monitoring: Secondary | ICD-10-CM

## 2023-12-13 DIAGNOSIS — M3119 Other thrombotic microangiopathy: Secondary | ICD-10-CM | POA: Diagnosis not present

## 2023-12-13 LAB — POCT INR: INR: 2.7 (ref 2.0–3.0)

## 2023-12-13 NOTE — Progress Notes (Signed)
 Description   INR 2.7; Spoke with patient and Leita @ [facility] RN advised to have patient to continue taking Warfarin 5mg  (1 tablet) daily.  Continue veggies 3 times a week & stay consistent with greens each week.  Recheck INR 6 weeks in the office.  Call us  with any new medications or concerns [phone].

## 2023-12-13 NOTE — Patient Instructions (Addendum)
 Description   INR 2.7; Spoke with patient and Leita @ [facility] RN advised to have patient to continue taking Warfarin 5mg  (1 tablet) daily.  Continue veggies 3 times a week & stay consistent with greens each week.  Recheck INR 5 weeks in the office.  Call us  with any new medications or concerns [phone].

## 2024-01-08 ENCOUNTER — Telehealth: Payer: Self-pay | Admitting: Cardiology

## 2024-01-08 ENCOUNTER — Ambulatory Visit: Attending: Cardiology

## 2024-01-08 DIAGNOSIS — Z5181 Encounter for therapeutic drug level monitoring: Secondary | ICD-10-CM | POA: Diagnosis not present

## 2024-01-08 DIAGNOSIS — M3119 Other thrombotic microangiopathy: Secondary | ICD-10-CM | POA: Diagnosis not present

## 2024-01-08 DIAGNOSIS — I4819 Other persistent atrial fibrillation: Secondary | ICD-10-CM

## 2024-01-08 LAB — POCT INR: INR: 2.2 (ref 2.0–3.0)

## 2024-01-08 NOTE — Patient Instructions (Signed)
 Description   INR 2.2;  Continue taking Warfarin 5mg  (1 tablet) daily.  Continue veggies 3 times a week & stay consistent with greens each week.  Recheck INR 5 weeks in the office.  Call us  with any new medications or concerns [phone].

## 2024-01-08 NOTE — Telephone Encounter (Signed)
 New message  Talk to Dr Jeffrie about being referred to take the wt loss injection/pill.

## 2024-01-08 NOTE — Telephone Encounter (Signed)
 Spoke with [employer] [facility]. Pt is not there. Will need to call back.

## 2024-01-08 NOTE — Progress Notes (Signed)
 Description   INR 2.2;  Continue taking Warfarin 5mg  (1 tablet) daily.  Continue veggies 3 times a week & stay consistent with greens each week.  Recheck INR 5 weeks in the office.  Call us  with any new medications or concerns [phone].

## 2024-01-15 ENCOUNTER — Other Ambulatory Visit: Payer: Self-pay

## 2024-01-15 DIAGNOSIS — Z5181 Encounter for therapeutic drug level monitoring: Secondary | ICD-10-CM

## 2024-01-15 MED ORDER — WARFARIN SODIUM 5 MG PO TABS: ORAL_TABLET | ORAL | 1 refills | Status: AC

## 2024-01-19 ENCOUNTER — Ambulatory Visit

## 2024-01-22 IMAGING — CR DG CHEST 2V
2 series · 2 of 2 positions shown · non-contrast
Comparison: August 15, 2020.

CLINICAL DATA: Chest pain, shortness of breath.

EXAM:
CHEST - 2 VIEW

[chest lat]
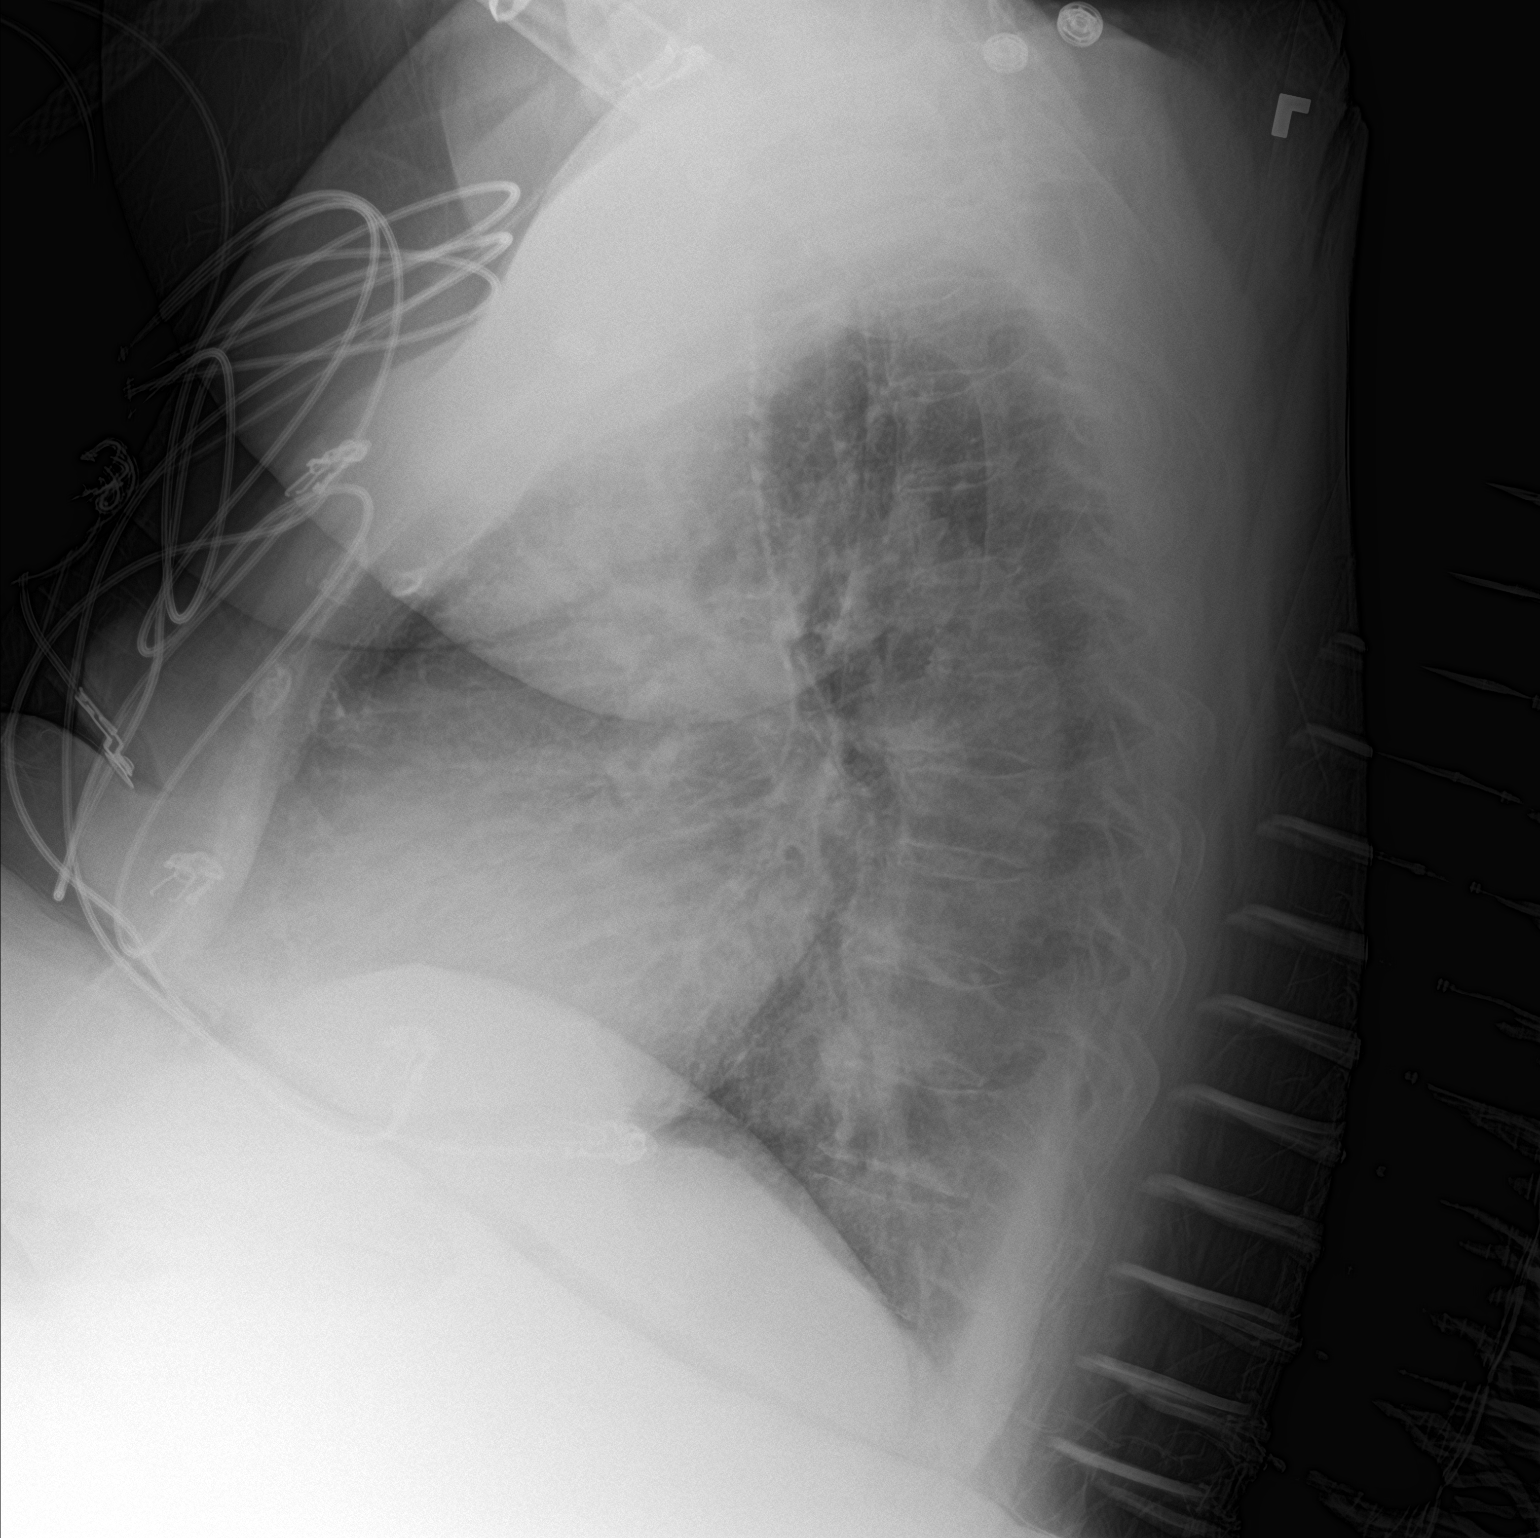

[chest ap]
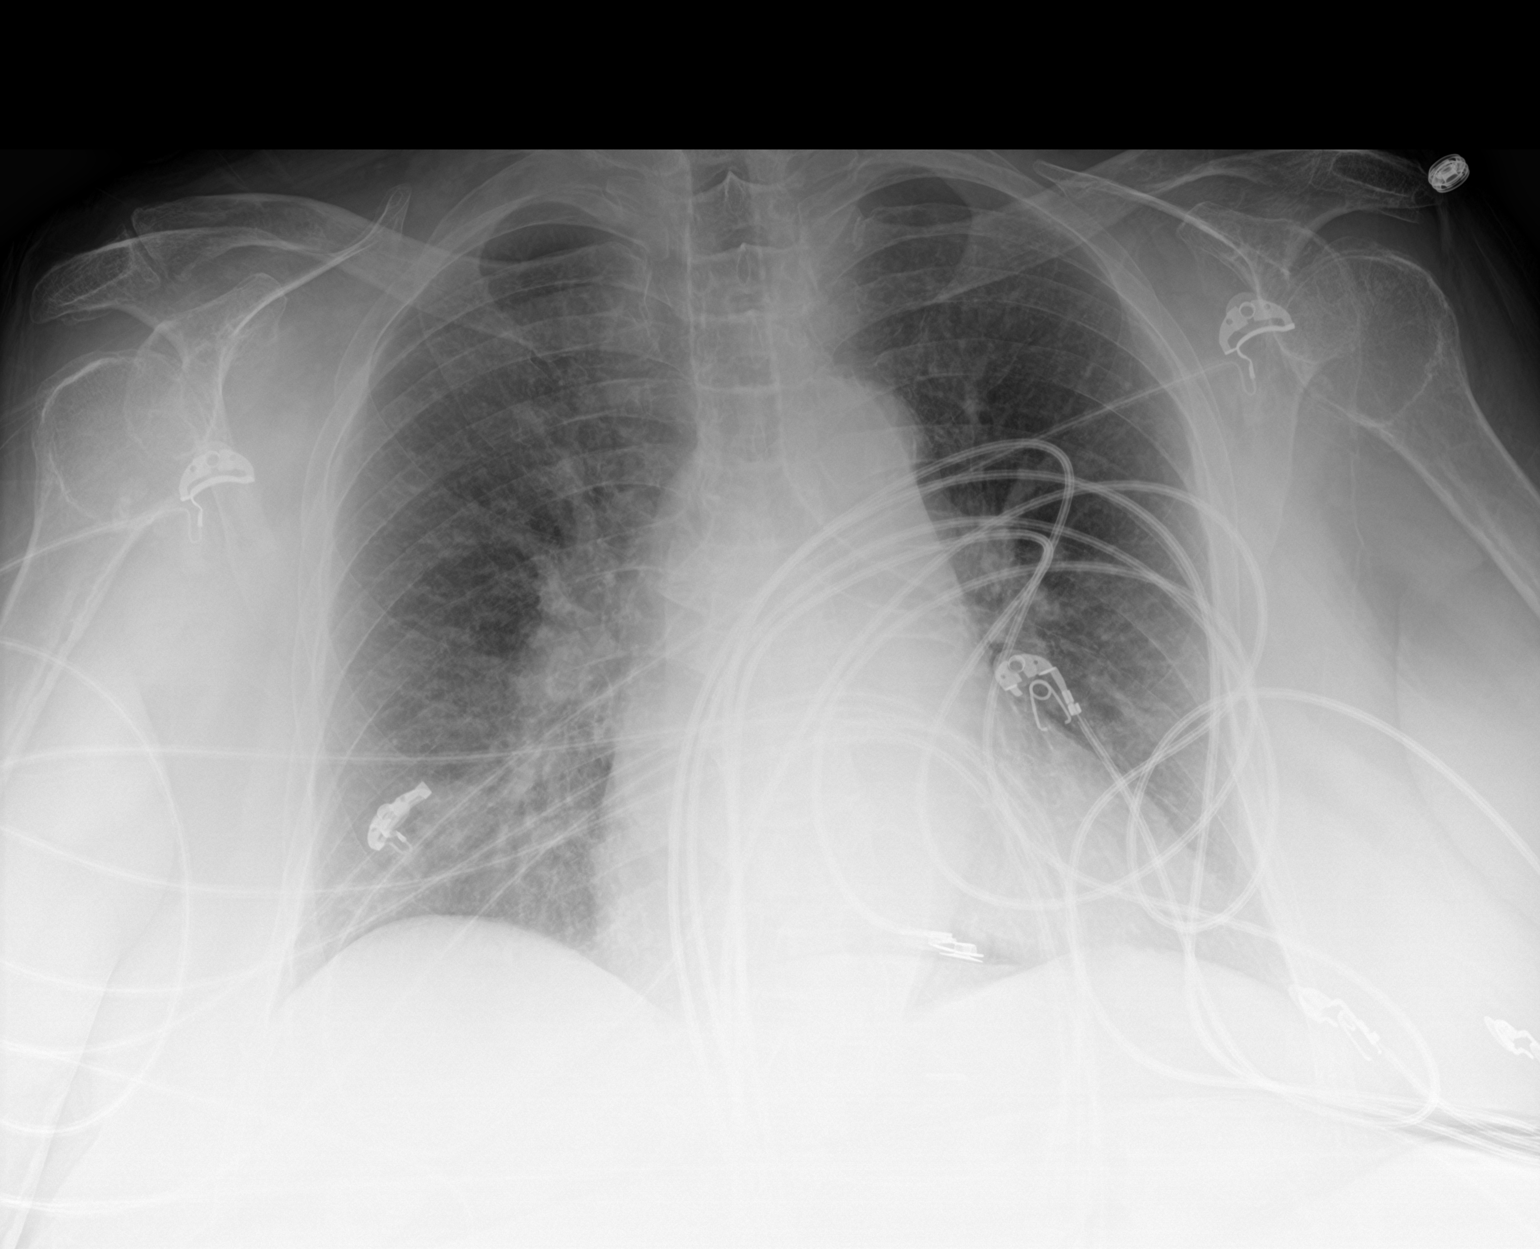

[2 of 2 positions shown; findings below may reference images not displayed]

FINDINGS: Stable cardiomegaly with mild central pulmonary vascular congestion.
No definite consolidative process is noted. Bony thorax is
unremarkable.
IMPRESSION: Stable cardiomegaly with mild central pulmonary vascular congestion.

## 2024-01-28 ENCOUNTER — Other Ambulatory Visit: Payer: Self-pay | Admitting: Cardiology

## 2024-02-09 ENCOUNTER — Ambulatory Visit: Attending: Cardiology

## 2024-02-09 DIAGNOSIS — I4819 Other persistent atrial fibrillation: Secondary | ICD-10-CM | POA: Diagnosis not present

## 2024-02-09 DIAGNOSIS — Z5181 Encounter for therapeutic drug level monitoring: Secondary | ICD-10-CM | POA: Diagnosis not present

## 2024-02-09 LAB — POCT INR: INR: 2.6 (ref 2.0–3.0)

## 2024-02-09 NOTE — Patient Instructions (Signed)
 Continue taking Warfarin 5mg  (1 tablet) daily.  Continue veggies 3 times a week & stay consistent with greens each week.  Recheck INR 6 weeks.  Call us  with any new medications or concerns [phone].

## 2024-02-09 NOTE — Progress Notes (Signed)
"   INR 2.6 Please see anticoagulation encounter Continue taking Warfarin 5mg  (1 tablet) daily.  Continue veggies 3 times a week & stay consistent with greens each week.  Recheck INR 6 weeks.  Call us  with any new medications or concerns [phone]. "

## 2024-02-12 ENCOUNTER — Ambulatory Visit

## 2024-03-22 ENCOUNTER — Ambulatory Visit
